# Patient Record
Sex: Female | Born: 1955 | Race: White | Hispanic: No | Marital: Married | Smoking: Never smoker
Health system: Southern US, Community
[De-identification: ages and names within clinical notes are randomized; demographics above are authoritative.]

## PROBLEM LIST (undated history)

## (undated) DIAGNOSIS — I639 Cerebral infarction, unspecified: Secondary | ICD-10-CM

## (undated) DIAGNOSIS — F0390 Unspecified dementia without behavioral disturbance: Secondary | ICD-10-CM

## (undated) DIAGNOSIS — E46 Unspecified protein-calorie malnutrition: Secondary | ICD-10-CM

## (undated) DIAGNOSIS — E7800 Pure hypercholesterolemia, unspecified: Secondary | ICD-10-CM

## (undated) DIAGNOSIS — I10 Essential (primary) hypertension: Secondary | ICD-10-CM

---

## 2006-10-20 ENCOUNTER — Inpatient Hospital Stay (HOSPITAL_COMMUNITY): Admission: EM | Admit: 2006-10-20 | Discharge: 2006-10-21 | Payer: Self-pay | Admitting: Emergency Medicine

## 2006-10-23 ENCOUNTER — Inpatient Hospital Stay (HOSPITAL_COMMUNITY): Admission: EM | Admit: 2006-10-23 | Discharge: 2006-10-28 | Payer: Self-pay | Admitting: Emergency Medicine

## 2006-10-28 ENCOUNTER — Inpatient Hospital Stay (HOSPITAL_COMMUNITY)
Admission: RE | Admit: 2006-10-28 | Discharge: 2006-11-05 | Payer: Self-pay | Admitting: Physical Medicine & Rehabilitation

## 2006-10-28 ENCOUNTER — Ambulatory Visit: Payer: Self-pay | Admitting: Physical Medicine & Rehabilitation

## 2006-11-07 ENCOUNTER — Encounter (HOSPITAL_COMMUNITY)
Admission: RE | Admit: 2006-11-07 | Discharge: 2006-12-07 | Payer: Self-pay | Admitting: Physical Medicine & Rehabilitation

## 2006-11-11 ENCOUNTER — Encounter: Payer: Self-pay | Admitting: Interventional Radiology

## 2006-12-09 ENCOUNTER — Encounter (HOSPITAL_COMMUNITY)
Admission: RE | Admit: 2006-12-09 | Discharge: 2006-12-30 | Payer: Self-pay | Admitting: Physical Medicine & Rehabilitation

## 2006-12-11 ENCOUNTER — Encounter
Admission: RE | Admit: 2006-12-11 | Discharge: 2007-03-11 | Payer: Self-pay | Admitting: Physical Medicine & Rehabilitation

## 2006-12-11 ENCOUNTER — Ambulatory Visit: Payer: Self-pay | Admitting: Physical Medicine & Rehabilitation

## 2007-01-01 ENCOUNTER — Encounter (HOSPITAL_COMMUNITY)
Admission: RE | Admit: 2007-01-01 | Discharge: 2007-01-31 | Payer: Self-pay | Admitting: Physical Medicine & Rehabilitation

## 2007-02-04 ENCOUNTER — Encounter
Admission: RE | Admit: 2007-02-04 | Discharge: 2007-05-05 | Payer: Self-pay | Admitting: Physical Medicine & Rehabilitation

## 2007-02-04 ENCOUNTER — Ambulatory Visit: Payer: Self-pay | Admitting: Physical Medicine & Rehabilitation

## 2011-01-20 ENCOUNTER — Encounter: Payer: Self-pay | Admitting: Interventional Radiology

## 2013-10-01 ENCOUNTER — Other Ambulatory Visit (HOSPITAL_COMMUNITY): Payer: Self-pay | Admitting: Family Medicine

## 2013-10-01 DIAGNOSIS — Z139 Encounter for screening, unspecified: Secondary | ICD-10-CM

## 2013-10-02 ENCOUNTER — Other Ambulatory Visit (HOSPITAL_COMMUNITY): Payer: Self-pay

## 2013-10-06 ENCOUNTER — Other Ambulatory Visit (HOSPITAL_COMMUNITY): Payer: Self-pay

## 2016-01-03 ENCOUNTER — Emergency Department (HOSPITAL_COMMUNITY)
Admission: EM | Admit: 2016-01-03 | Discharge: 2016-01-03 | Disposition: A | Discharge status: Home / Self Care | Payer: 59 | Attending: Emergency Medicine | Admitting: Emergency Medicine

## 2016-01-03 ENCOUNTER — Emergency Department (HOSPITAL_COMMUNITY): Payer: 59

## 2016-01-03 ENCOUNTER — Encounter (HOSPITAL_COMMUNITY): Payer: Self-pay | Admitting: Emergency Medicine

## 2016-01-03 DIAGNOSIS — S80212A Abrasion, left knee, initial encounter: Secondary | ICD-10-CM | POA: Insufficient documentation

## 2016-01-03 DIAGNOSIS — Z8673 Personal history of transient ischemic attack (TIA), and cerebral infarction without residual deficits: Secondary | ICD-10-CM | POA: Diagnosis not present

## 2016-01-03 DIAGNOSIS — W1839XA Other fall on same level, initial encounter: Secondary | ICD-10-CM | POA: Diagnosis not present

## 2016-01-03 DIAGNOSIS — S80211A Abrasion, right knee, initial encounter: Secondary | ICD-10-CM | POA: Insufficient documentation

## 2016-01-03 DIAGNOSIS — R4182 Altered mental status, unspecified: Secondary | ICD-10-CM | POA: Diagnosis present

## 2016-01-03 DIAGNOSIS — F0390 Unspecified dementia without behavioral disturbance: Secondary | ICD-10-CM | POA: Diagnosis not present

## 2016-01-03 DIAGNOSIS — Y998 Other external cause status: Secondary | ICD-10-CM | POA: Insufficient documentation

## 2016-01-03 DIAGNOSIS — Y9289 Other specified places as the place of occurrence of the external cause: Secondary | ICD-10-CM | POA: Diagnosis not present

## 2016-01-03 DIAGNOSIS — I10 Essential (primary) hypertension: Secondary | ICD-10-CM | POA: Diagnosis not present

## 2016-01-03 DIAGNOSIS — Y9389 Activity, other specified: Secondary | ICD-10-CM | POA: Insufficient documentation

## 2016-01-03 DIAGNOSIS — Z8639 Personal history of other endocrine, nutritional and metabolic disease: Secondary | ICD-10-CM | POA: Insufficient documentation

## 2016-01-03 DIAGNOSIS — S80219A Abrasion, unspecified knee, initial encounter: Secondary | ICD-10-CM

## 2016-01-03 DIAGNOSIS — W19XXXA Unspecified fall, initial encounter: Secondary | ICD-10-CM

## 2016-01-03 HISTORY — DX: Cerebral infarction, unspecified: I63.9

## 2016-01-03 HISTORY — DX: Pure hypercholesterolemia, unspecified: E78.00

## 2016-01-03 HISTORY — DX: Essential (primary) hypertension: I10

## 2016-01-03 LAB — CBC
HCT: 42.3 % (ref 36.0–46.0)
HEMOGLOBIN: 14.6 g/dL (ref 12.0–15.0)
MCH: 31.3 pg (ref 26.0–34.0)
MCHC: 34.5 g/dL (ref 30.0–36.0)
MCV: 90.8 fL (ref 78.0–100.0)
PLATELETS: 163 10*3/uL (ref 150–400)
RBC: 4.66 MIL/uL (ref 3.87–5.11)
RDW: 11.9 % (ref 11.5–15.5)
WBC: 8.5 10*3/uL (ref 4.0–10.5)

## 2016-01-03 LAB — BASIC METABOLIC PANEL
ANION GAP: 11 (ref 5–15)
BUN: 17 mg/dL (ref 6–20)
CHLORIDE: 106 mmol/L (ref 101–111)
CO2: 22 mmol/L (ref 22–32)
Calcium: 9.1 mg/dL (ref 8.9–10.3)
Creatinine, Ser: 0.99 mg/dL (ref 0.44–1.00)
Glucose, Bld: 105 mg/dL — ABNORMAL HIGH (ref 65–99)
POTASSIUM: 4.1 mmol/L (ref 3.5–5.1)
SODIUM: 139 mmol/L (ref 135–145)

## 2016-01-03 LAB — URINALYSIS, ROUTINE W REFLEX MICROSCOPIC
Bilirubin Urine: NEGATIVE
Glucose, UA: NEGATIVE mg/dL
HGB URINE DIPSTICK: NEGATIVE
Ketones, ur: NEGATIVE mg/dL
LEUKOCYTES UA: NEGATIVE
NITRITE: NEGATIVE
PROTEIN: 30 mg/dL — AB
SPECIFIC GRAVITY, URINE: 1.025 (ref 1.005–1.030)
pH: 5.5 (ref 5.0–8.0)

## 2016-01-03 LAB — URINE MICROSCOPIC-ADD ON: RBC / HPF: NONE SEEN RBC/hpf (ref 0–5)

## 2016-01-03 NOTE — ED Notes (Signed)
Pt brought in by EMS for confusion.  Husband states that pt has confusion normally due to a stroke and it is sometimes worse than others.  PT was found walking down the road where bystanders called ems due to pt appearing lost.

## 2016-01-03 NOTE — ED Provider Notes (Signed)
CSN: [id number]     Arrival date & time 01/03/16  1443 History   First MD Initiated Contact with Patient 01/03/16 1455     Chief Complaint  Patient presents with  . Altered Mental Status   Level V caveat due to altered mental status history obtained from husband  (Consider location/radiation/quality/duration/timing/severity/associated sxs/prior Treatment) HPI Female with a history of multiple strokes and dementia who is brought in today via EMS after she wandered away from home and fell. Her husband is the historian. He states that she was going out onto the porch normally does not wander. Today she wondered several blocks from home. She fell on the ground and that is what alerted stander by to help her. They were able to obtain her name and brought her in to the hospital via EMS secondary to confusion. Husband states that she is at baseline. He has not noted any changes in behavior besides the fact that she wandered away this morning. She has been eating and drinking as normally. She has not had any fever, chills, cough, dyspnea or other changes in behavior. She walks with a limp which he states has occurred since her first stroke. Past Medical History  Diagnosis Date  . Stroke (HCC)   . Hypertension   . High cholesterol    History reviewed. No pertinent past surgical history. History reviewed. No pertinent family history. Social History  Substance Use Topics  . Smoking status: Never Smoker   . Smokeless tobacco: None  . Alcohol Use: No   OB History    No data available     Review of Systems  All other systems reviewed and are negative.     Allergies  Review of patient's allergies indicates no known allergies.  Home Medications   Prior to Admission medications   Not on File   BP 195/93 mmHg  Pulse 86  Temp(Src) 97.9 F (36.6 C) (Oral)  Resp 18  Ht 5\' 6"  (1.676 m)  Wt 81.647 kg  BMI 29.07 kg/m2  SpO2 96% Physical Exam  Constitutional: She appears well-developed  and well-nourished.  HENT:  Head: Normocephalic and atraumatic.  Right Ear: External ear normal.  Left Ear: External ear normal.  Nose: Nose normal.  Mouth/Throat: Oropharynx is clear and moist.  Eyes: Conjunctivae and EOM are normal. Pupils are equal, round, and reactive to light.  Neck: Normal range of motion. Neck supple. No JVD present. No tracheal deviation present. No thyromegaly present.  Cardiovascular: Normal rate, regular rhythm, normal heart sounds and intact distal pulses.   Pulmonary/Chest: Effort normal and breath sounds normal. She has no wheezes.  Abdominal: Soft. Bowel sounds are normal. She exhibits no mass. There is no tenderness. There is no guarding.  Musculoskeletal: Normal range of motion.  Bilateral knee abrasion  Lymphadenopathy:    She has no cervical adenopathy.  Neurological: She is alert. She has normal reflexes. No cranial nerve deficit or sensory deficit. Gait normal. GCS eye subscore is 4. GCS verbal subscore is 5. GCS motor subscore is 6.  Reflex Scores:      Bicep reflexes are 2+ on the right side and 2+ on the left side.      Patellar reflexes are 2+ on the right side and 2+ on the left side. Patient is oriented to person and place but not to date including year  Strength is normal and equal throughout. Cranial nerves grossly intact. Patient fluent. No gross ataxia and patient able to ambulate without difficulty. She has a  mild limp  Skin: Skin is warm and dry.  Psychiatric: She has a normal mood and affect. Her behavior is normal.  Nursing note and vitals reviewed.   ED Course  Procedures (including critical care time) Labs Review Labs Reviewed  BASIC METABOLIC PANEL - Abnormal; Notable for the following:    Glucose, Bld 105 (*)    All other components within normal limits  URINALYSIS, ROUTINE W REFLEX MICROSCOPIC (NOT AT [facility]) - Abnormal; Notable for the following:    Protein, ur 30 (*)    All other components within normal limits  URINE  MICROSCOPIC-ADD ON - Abnormal; Notable for the following:    Squamous Epithelial / LPF 0-5 (*)    Bacteria, UA RARE (*)    Casts HYALINE CASTS (*)    All other components within normal limits  CBC    Imaging Review Ct Head Wo Contrast  01/03/2016  CLINICAL DATA:  New onset confusion. History of previous strokes. History of hypertension and hypercholesterolemia. EXAM: CT HEAD WITHOUT CONTRAST TECHNIQUE: Contiguous axial images were obtained from the base of the skull through the vertex without intravenous contrast. COMPARISON:  CT head 10/23/2006.  MR head 10/23/2006. FINDINGS: Global atrophy with hydrocephalus ex vacuo, premature for the patient's age of 60. No visible acute stroke, acute hemorrhage, mass lesion, or extra-axial fluid. Extensive white matter disease throughout the periventricular and subcortical white matter. Remote BILATERAL lacunar infarcts affecting deep white matter and deep nuclei. Calvarium intact. Vascular calcification. Prominence of the LEFT ICA terminus noted on prior CT. No sinus or mastoid disease. IMPRESSION: Chronic changes as described. No acute intracranial findings. No acute cortical infarct or intracranial hemorrhage is observed. Electronically Signed   By: Elsie StainJohn T Curnes M.D.   On: 01/03/2016 16:00   I have personally reviewed and evaluated these images and lab results as part of my medical decision-making.   MDM   Final diagnoses:  Dementia, without behavioral disturbance  Fall, initial encounter  Knee abrasion, unspecified laterality, initial encounter   60 year old female with history of strokes and dementia presents today after wandering away from home and falling. She has some abrasions to her knees but appears otherwise uninjured. She is ambulating here without difficulty. Labs were obtained and showed no evidence of acute abnormality. She is discharged home with her husband at baseline condition.    Margarita Grizzleanielle Kierah Goatley, MD 01/03/16 [phone]

## 2016-01-03 NOTE — Discharge Instructions (Signed)

## 2016-12-15 ENCOUNTER — Encounter (HOSPITAL_COMMUNITY): Payer: Self-pay | Admitting: Emergency Medicine

## 2016-12-15 ENCOUNTER — Emergency Department (HOSPITAL_COMMUNITY): Payer: 59

## 2016-12-15 ENCOUNTER — Inpatient Hospital Stay (HOSPITAL_COMMUNITY)
Admission: EM | Admit: 2016-12-15 | Discharge: 2016-12-18 | DRG: 690 | Disposition: A | Discharge status: Skilled Nursing Facility | Payer: 59 | Attending: Internal Medicine | Admitting: Internal Medicine

## 2016-12-15 DIAGNOSIS — I10 Essential (primary) hypertension: Secondary | ICD-10-CM | POA: Diagnosis not present

## 2016-12-15 DIAGNOSIS — Z8673 Personal history of transient ischemic attack (TIA), and cerebral infarction without residual deficits: Secondary | ICD-10-CM

## 2016-12-15 DIAGNOSIS — I639 Cerebral infarction, unspecified: Secondary | ICD-10-CM

## 2016-12-15 DIAGNOSIS — E7800 Pure hypercholesterolemia, unspecified: Secondary | ICD-10-CM

## 2016-12-15 DIAGNOSIS — R2689 Other abnormalities of gait and mobility: Secondary | ICD-10-CM

## 2016-12-15 DIAGNOSIS — I69319 Unspecified symptoms and signs involving cognitive functions following cerebral infarction: Secondary | ICD-10-CM

## 2016-12-15 DIAGNOSIS — N3000 Acute cystitis without hematuria: Secondary | ICD-10-CM | POA: Diagnosis not present

## 2016-12-15 DIAGNOSIS — E872 Acidosis, unspecified: Secondary | ICD-10-CM | POA: Diagnosis present

## 2016-12-15 DIAGNOSIS — W19XXXA Unspecified fall, initial encounter: Secondary | ICD-10-CM

## 2016-12-15 DIAGNOSIS — Z79899 Other long term (current) drug therapy: Secondary | ICD-10-CM

## 2016-12-15 DIAGNOSIS — R296 Repeated falls: Secondary | ICD-10-CM | POA: Diagnosis present

## 2016-12-15 DIAGNOSIS — M6281 Muscle weakness (generalized): Secondary | ICD-10-CM

## 2016-12-15 DIAGNOSIS — N3001 Acute cystitis with hematuria: Principal | ICD-10-CM

## 2016-12-15 DIAGNOSIS — N390 Urinary tract infection, site not specified: Secondary | ICD-10-CM | POA: Diagnosis present

## 2016-12-15 DIAGNOSIS — R531 Weakness: Secondary | ICD-10-CM

## 2016-12-15 LAB — COMPREHENSIVE METABOLIC PANEL
ALT: 12 U/L — ABNORMAL LOW (ref 14–54)
ANION GAP: 10 (ref 5–15)
AST: 24 U/L (ref 15–41)
Albumin: 3.8 g/dL (ref 3.5–5.0)
Alkaline Phosphatase: 64 U/L (ref 38–126)
BUN: 14 mg/dL (ref 6–20)
CO2: 22 mmol/L (ref 22–32)
Calcium: 8.7 mg/dL — ABNORMAL LOW (ref 8.9–10.3)
Chloride: 106 mmol/L (ref 101–111)
Creatinine, Ser: 0.97 mg/dL (ref 0.44–1.00)
GFR calc non Af Amer: >60 mL/min (ref >60)
Glucose, Bld: 126 mg/dL — ABNORMAL HIGH (ref 65–99)
Potassium: 3.5 mmol/L (ref 3.5–5.1)
SODIUM: 138 mmol/L (ref 135–145)
Total Bilirubin: 0.5 mg/dL (ref 0.3–1.2)
Total Protein: 7 g/dL (ref 6.5–8.1)

## 2016-12-15 LAB — I-STAT CG4 LACTIC ACID, ED: Lactic Acid, Venous: 1.95 mmol/L (ref 0.5–1.9)

## 2016-12-15 LAB — LACTIC ACID, PLASMA: LACTIC ACID, VENOUS: 0.9 mmol/L (ref 0.5–1.9)

## 2016-12-15 LAB — URINALYSIS, ROUTINE W REFLEX MICROSCOPIC
Bilirubin Urine: NEGATIVE
GLUCOSE, UA: NEGATIVE mg/dL
KETONES UR: NEGATIVE mg/dL
NITRITE: NEGATIVE
PH: 5 (ref 5.0–8.0)
PROTEIN: 100 mg/dL — AB
Specific Gravity, Urine: 1.016 (ref 1.005–1.030)

## 2016-12-15 LAB — CBC WITH DIFFERENTIAL/PLATELET
Basophils Absolute: 0 10*3/uL (ref 0.0–0.1)
Basophils Relative: 0 %
EOS ABS: 0 10*3/uL (ref 0.0–0.7)
EOS PCT: 0 %
HCT: 35.7 % — ABNORMAL LOW (ref 36.0–46.0)
Hemoglobin: 11.8 g/dL — ABNORMAL LOW (ref 12.0–15.0)
LYMPHS ABS: 0.3 10*3/uL — AB (ref 0.7–4.0)
Lymphocytes Relative: 4 %
MCH: 30.6 pg (ref 26.0–34.0)
MCHC: 33.1 g/dL (ref 30.0–36.0)
MCV: 92.7 fL (ref 78.0–100.0)
MONO ABS: 0.6 10*3/uL (ref 0.1–1.0)
MONOS PCT: 7 %
Neutro Abs: 7.7 10*3/uL (ref 1.7–7.7)
Neutrophils Relative %: 89 %
PLATELETS: 180 10*3/uL (ref 150–400)
RBC: 3.85 MIL/uL — ABNORMAL LOW (ref 3.87–5.11)
RDW: 14.6 % (ref 11.5–15.5)
WBC: 8.6 10*3/uL (ref 4.0–10.5)

## 2016-12-15 MED ORDER — ACETAMINOPHEN 650 MG RE SUPP: 650.0000 mg | Freq: Four times a day (QID) | RECTAL | Status: DC | PRN

## 2016-12-15 MED ORDER — ATORVASTATIN CALCIUM 10 MG PO TABS
10.0000 mg | ORAL_TABLET | Freq: Every day | ORAL | Status: DC
  Administered 2016-12-16 – 2016-12-18 (×3): 10 mg via ORAL
  Filled 2016-12-15 (×3): qty 1

## 2016-12-15 MED ORDER — ONDANSETRON HCL 4 MG PO TABS: 4.0000 mg | ORAL_TABLET | Freq: Four times a day (QID) | ORAL | Status: DC | PRN

## 2016-12-15 MED ORDER — CLONIDINE HCL 0.2 MG PO TABS
0.2000 mg | ORAL_TABLET | Freq: Two times a day (BID) | ORAL | Status: DC
  Administered 2016-12-15 – 2016-12-18 (×5): 0.2 mg via ORAL
  Filled 2016-12-15 (×6): qty 1

## 2016-12-15 MED ORDER — POLYETHYLENE GLYCOL 3350 17 G PO PACK: 17.0000 g | PACK | Freq: Every day | ORAL | Status: DC | PRN

## 2016-12-15 MED ORDER — ENSURE ENLIVE PO LIQD
237.0000 mL | Freq: Two times a day (BID) | ORAL | Status: DC
  Administered 2016-12-16 – 2016-12-18 (×5): 237 mL via ORAL

## 2016-12-15 MED ORDER — DEXTROSE 5 % IV SOLN
1.0000 g | INTRAVENOUS | Status: DC
  Administered 2016-12-16 – 2016-12-17 (×2): 1 g via INTRAVENOUS
  Filled 2016-12-15 (×3): qty 10

## 2016-12-15 MED ORDER — DEXTROSE 5 % IV SOLN
1.0000 g | Freq: Once | INTRAVENOUS | Status: AC
  Administered 2016-12-15: 1 g via INTRAVENOUS
  Filled 2016-12-15: qty 10

## 2016-12-15 MED ORDER — ACETAMINOPHEN 325 MG PO TABS: 650.0000 mg | ORAL_TABLET | Freq: Four times a day (QID) | ORAL | Status: DC | PRN

## 2016-12-15 MED ORDER — SODIUM CHLORIDE 0.9 % IV BOLUS (SEPSIS)
1000.0000 mL | Freq: Once | INTRAVENOUS | Status: AC
  Administered 2016-12-15: 1000 mL via INTRAVENOUS

## 2016-12-15 MED ORDER — POTASSIUM CHLORIDE IN NACL 40-0.9 MEQ/L-% IV SOLN
INTRAVENOUS | Status: DC
  Administered 2016-12-15 – 2016-12-18 (×5): 75 mL/h via INTRAVENOUS

## 2016-12-15 MED ORDER — ASPIRIN-DIPYRIDAMOLE ER 25-200 MG PO CP12
1.0000 | ORAL_CAPSULE | Freq: Two times a day (BID) | ORAL | Status: DC
  Administered 2016-12-15 – 2016-12-18 (×5): 1 via ORAL
  Filled 2016-12-15 (×8): qty 1

## 2016-12-15 MED ORDER — ONDANSETRON HCL 4 MG/2ML IJ SOLN
4.0000 mg | Freq: Four times a day (QID) | INTRAMUSCULAR | Status: DC | PRN
  Administered 2016-12-17 – 2016-12-18 (×2): 4 mg via INTRAVENOUS
  Filled 2016-12-15 (×2): qty 2

## 2016-12-15 MED ORDER — ENOXAPARIN SODIUM 40 MG/0.4ML ~~LOC~~ SOLN
40.0000 mg | SUBCUTANEOUS | Status: DC
  Administered 2016-12-15 – 2016-12-17 (×2): 40 mg via SUBCUTANEOUS
  Filled 2016-12-15 (×3): qty 0.4

## 2016-12-15 NOTE — Progress Notes (Signed)
Text-paged MD to request safety sitter. Earnstine RegalAshley Erinn Huskins, RN

## 2016-12-15 NOTE — Progress Notes (Signed)
Fall prevention, safety plan discussed with patient and husband on admission. Patient's husband verbalized understanding of safety plan. pt requires frequent reinforcement. bedalarm on for safety, call light and personal items left within reach. Safety mats placed by bedside. Yellow nonslip socks and fall risk bracelet in place. Frequently reminded not to attempt getting up on her own. Earnstine RegalAshley Miranda Frese, RN

## 2016-12-15 NOTE — H&P (Signed)
History and Physical  Maria Reddenmy A Mapps ZOX:[id number] DOB: 1956-08-26 DOA: 12/15/2016  Referring physician: Dr Rush Landmarkegeler, ED physician PCP: Colette RibasGOLDING, JOHN CABOT, MD  Outpatient Specialists:     Chief Complaint: Weakness, falls  HPI: Maria Ortega is a 60 y.o. female with a history of multiple strokes on aggrenox, hypertension, high cholesterol. She presents to the emergency department after having a couple of falls earlier today. 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 First fall occurred when ambulating back from using the bathroom this morning - she was walking back to the couch and barely made it to the couch before she collapsed. Second fall occurred when she was getting dressed - her husband was helping her and she fell off from the chair that she was sitting on. Patient did not lose consciousness or strike her head. She denies fevers, chills. She has been using the bathroom more frequently, although appearing small amounts. She has been nauseated intermittently since last night. No palliating or provoking factors.   Emergency Department Course: Urinalysis suggestive of UTI. She does have a lactic acidosis of 1.95. She received 1 L bolus of IV fluids. CT of the head was negative. Patient received the gram of Rocephin in the emergency department.  Review of Systems:   Pt complains of nausea.  Pt denies any fevers, chills, vomiting, diarrhea, constipation, abdominal pain, shortness of breath, dyspnea on exertion, orthopnea, cough, wheezing, palpitations, headache, vision changes, lightheadedness, dizziness, melena, rectal bleeding.  Review of systems are otherwise negative  Past Medical History:  Diagnosis Date  . High cholesterol   . Hypertension   . Stroke [facility](HCC)    History reviewed. No pertinent surgical history. Social History:  reports that she has never smoked. She does not have any smokeless tobacco history on file. She  reports that she does not drink alcohol or use drugs. Patient lives at Home  No Known Allergies  History reviewed. No pertinent family history.  Patient unaware of family history  Prior to Admission medications   Medication Sig Start Date End Date Taking? Authorizing Provider  atorvastatin (LIPITOR) 10 MG tablet Take 1 tablet by mouth daily. 12/11/16  Yes Historical Provider, MD  Chlorpheniramine-Acetaminophen (CORICIDIN HBP COLD/FLU PO) Take 2 tablets by mouth daily as needed.   Yes Historical Provider, MD  cloNIDine (CATAPRES) 0.2 MG tablet Take 1 tablet by mouth 2 (two) times daily. 12/06/16  Yes Historical Provider, MD  dipyridamole-aspirin (AGGRENOX) 200-25 MG 12hr capsule TK 1 C PO BID 12/14/15  Yes Historical Provider, MD    Physical Exam: BP 170/83   Pulse 81   Temp 98.3 F (36.8 C) (Oral)   Resp (!) 32   Ht 5\' 5"  (1.651 m)   Wt 74.8 kg (165 lb)   SpO2 100%   BMI 27.46 kg/m   General: Elderly Caucasian female. Awake and alert and oriented x3. No acute cardiopulmonary distress.  HEENT: Normocephalic atraumatic.  Right and left ears normal in appearance.  Pupils equal, round, reactive to light. Extraocular muscles are intact. Sclerae anicteric and noninjected.  Moist mucosal membranes. No mucosal lesions.  Neck: Neck supple without lymphadenopathy. No carotid bruits. No masses palpated.  Cardiovascular: Regular rate with normal S1-S2 sounds. No murmurs, rubs, gallops auscultated. No JVD.  Respiratory: Good respiratory effort with no wheezes, rales, rhonchi. Lungs clear to auscultation bilaterally.  No accessory muscle use. Abdomen: Soft, nondistended. Suprapubic tenderness to palpation. Active bowel sounds. No masses or hepatosplenomegaly  Skin: No rashes, lesions, or ulcerations.  Dry, warm to touch. 2+ dorsalis pedis and radial pulses. Musculoskeletal: No calf or leg pain. All major joints not erythematous nontender.  No upper or lower joint deformation.  Good ROM.  No  contractures  Psychiatric: Intact judgment and insight. Pleasant and cooperative. Neurologic: No focal neurological deficits. Strength is 5/5 and symmetric in upper and lower extremities.  Cranial nerves II through XII are grossly intact.           Labs on Admission: I have personally reviewed following labs and imaging studies  CBC:  Recent Labs Lab 12/15/16 1330  WBC 8.6  NEUTROABS 7.7  HGB 11.8*  HCT 35.7*  MCV 92.7  PLT 180   Basic Metabolic Panel:  Recent Labs Lab 12/15/16 1330  NA 138  K 3.5  CL 106  CO2 22  GLUCOSE 126*  BUN 14  CREATININE 0.97  CALCIUM 8.7*   GFR: Estimated Creatinine Clearance: 61.6 mL/min (by C-G formula based on SCr of 0.97 mg/dL). Liver Function Tests:  Recent Labs Lab 12/15/16 1330  AST 24  ALT 12*  ALKPHOS 64  BILITOT 0.5  PROT 7.0  ALBUMIN 3.8   No results for input(s): LIPASE, AMYLASE in the last 168 hours. No results for input(s): AMMONIA in the last 168 hours. Coagulation Profile: No results for input(s): INR, PROTIME in the last 168 hours. Cardiac Enzymes: No results for input(s): CKTOTAL, CKMB, CKMBINDEX, TROPONINI in the last 168 hours. BNP (last 3 results) No results for input(s): PROBNP in the last 8760 hours. HbA1C: No results for input(s): HGBA1C in the last 72 hours. CBG: No results for input(s): GLUCAP in the last 168 hours. Lipid Profile: No results for input(s): CHOL, HDL, LDLCALC, TRIG, CHOLHDL, LDLDIRECT in the last 72 hours. Thyroid Function Tests: No results for input(s): TSH, T4TOTAL, FREET4, T3FREE, THYROIDAB in the last 72 hours. Anemia Panel: No results for input(s): VITAMINB12, FOLATE, FERRITIN, TIBC, IRON, RETICCTPCT in the last 72 hours. Urine analysis:    Component Value Date/Time   COLORURINE YELLOW 12/15/2016 1323   APPEARANCEUR CLOUDY (A) 12/15/2016 1323   LABSPEC 1.016 12/15/2016 1323   PHURINE 5.0 12/15/2016 1323   GLUCOSEU NEGATIVE 12/15/2016 1323   HGBUR MODERATE (A) 12/15/2016  1323   BILIRUBINUR NEGATIVE 12/15/2016 1323   KETONESUR NEGATIVE 12/15/2016 1323   PROTEINUR 100 (A) 12/15/2016 1323   NITRITE NEGATIVE 12/15/2016 1323   LEUKOCYTESUR MODERATE (A) 12/15/2016 1323   Sepsis Labs: @LABRCNTIP (procalcitonin:4,lacticidven:4) )No results found for this or any previous visit (from the past 240 hour(s)).   Radiological Exams on Admission: Dg Chest 1 View  Result Date: 12/15/2016 CLINICAL DATA:  Larey SeatFell this morning.  Decreased activity. EXAM: CHEST 1 VIEW COMPARISON:  10/20/2006. FINDINGS: Normal sized heart. Tortuous and calcified thoracic aorta. Clear lungs. Diffuse osteopenia. No fracture or pneumothorax. IMPRESSION: No acute abnormality.  Aortic atherosclerosis. Electronically Signed   By: Beckie SaltsSteven  Reid M.D.   On: 12/15/2016 14:18   Ct Head Wo Contrast  Result Date: 12/15/2016 CLINICAL DATA:  Decreased activity following a fall this morning. EXAM: CT HEAD WITHOUT CONTRAST TECHNIQUE: Contiguous axial images were obtained from the base of the skull through the vertex without intravenous contrast. COMPARISON:  01/03/2016. FINDINGS: Brain: Diffusely enlarged ventricles and subarachnoid spaces. Patchy white matter low density in both cerebral hemispheres. Old left basal ganglia lacunar infarcts and small old right basal ganglia lacunar infarcts. There are also old bilateral thalamic lacunar infarcts. No intracranial hemorrhage, mass lesion or CT evidence  of acute infarction. Lacunar infarcts. Vascular: No hyperdense vessel or unexpected calcification. Skull: Normal. Negative for fracture or focal lesion. Sinuses/Orbits: No acute finding. Other: None. IMPRESSION: 1. No acute abnormality. 2. Mildly progressive atrophy and chronic small vessel white matter ischemic changes. 3. Old bilateral basal ganglia and thalamic lacunar infarcts. Electronically Signed   By: Beckie Salts M.D.   On: 12/15/2016 14:18    EKG: Independently reviewed. Sinus rhythm with right atrial enlargement  and right bundle branch block. No acute ST changes.  Assessment/Plan: Principal Problem:   UTI (urinary tract infection) Active Problems:   Hypertension   History of stroke   Lactic acidosis   Weakness    This patient was discussed with the ED physician, including pertinent vitals, physical exam findings, labs, and imaging.  We also discussed care given by the ED provider.  #1 UTI  Observation  Rocephin  Urine culture pending #2 lactic acidosis  Recheck serum lactate level  Continue IV fluids at 75 mLs per hour #3 weakness  No focal weaknesses  Likely secondary to UTI.  Consult PT #4 history of stroke  Continue Aggrenox #5 hypertension  Continue clonidine  DVT prophylaxis: Lovenox Consultants: PT Code Status: Full code Family Communication: Husband  Disposition Plan: Patient should be able to return home following observation   Levie Heritage, DO [facility] Pager [phone]  If 7PM-7AM, please contact night-coverage [email or web address] Password [email or web address]

## 2016-12-15 NOTE — ED Triage Notes (Signed)
Pt brought in by ems after a fall with no injury.  Husband called ems stating pt has not been walking as much as normal.  Pt has confusion at baseline.

## 2016-12-15 NOTE — ED Provider Notes (Signed)
[facility]Y DEPT Provider Note   CSN: [id number] Arrival date & time: 12/15/16  1241     History   Chief Complaint Chief Complaint  Patient presents with  . Weakness    HPI Maria Ortega is a 60 y.o. female with a past medical history significant for multiple strokes on Aggrenox, hypertension, hypercholesterolemia, and documentation of dementia who presents with fatigue, generalized weakness, and 2 falls this morning. Patient is accompanied by her husband who reports that patient was at her baseline yesterday. He reports that this morning, after she went to the restroom, she was walking back and "collapsed". He reports she denied her head or lose consciousness but just had profound weakness overall. He reports that he picked her up and set her on a chair however, she slumped out of the chair back to the ground. He denies that the patient has had any fevers or chills,  or any nausea or vomiting. She does report a mild dry cough. He says that she has not had consultation, diarrhea, or any change in urination. Patient denies any pain but does not remember the events today. Patient is on Aggrenox for her strokes. Patient denies any pain in extremities.  Husband arrived and provided supplemental history. He is very concerned about his ability to take care of her given the multiple falls she has had today while on her blood thinners.      The history is provided by the patient, the spouse and medical records. The history is limited by the condition of the patient.  Fall  This is a new problem. The current episode started 3 to 5 hours ago. The problem occurs constantly. The problem has not changed since onset.Pertinent negatives include no chest pain, no abdominal pain, no headaches and no shortness of breath. Nothing aggravates the symptoms. Nothing relieves the symptoms. She has tried nothing for the symptoms. The treatment provided no relief.     Past Medical History:  Diagnosis Date    . High cholesterol   . Hypertension   . Stroke [facility])     There are no active problems to display for this patient.   History reviewed. No pertinent surgical history.  OB History    No data available       Home Medications    Prior to Admission medications   Medication Sig Start Date End Date Taking? Authorizing Provider  dipyridamole-aspirin (AGGRENOX) 200-25 MG 12hr capsule TK 1 C PO BID 12/14/15   Historical Provider, MD    Family History History reviewed. No pertinent family history.  Social History Social History  Substance Use Topics  . Smoking status: Never Smoker  . Smokeless tobacco: Not on file  . Alcohol use No     Allergies   Patient has no known allergies.   Review of Systems Review of Systems  Constitutional: Positive for fatigue and unexpected weight change (wt loss). Negative for activity change, appetite change, chills, diaphoresis and fever.  HENT: Negative for congestion.   Eyes: Negative for visual disturbance.  Respiratory: Positive for cough (mild). Negative for chest tightness, shortness of breath, wheezing and stridor.   Cardiovascular: Negative for chest pain and palpitations.  Gastrointestinal: Negative for abdominal pain, constipation, diarrhea, nausea and vomiting.  Genitourinary: Positive for difficulty urinating (catheterized urine needed). Negative for flank pain and frequency.  Musculoskeletal: Negative for back pain, neck pain and neck stiffness.  Skin: Positive for wound (abrasion to nose).  Neurological: Negative for dizziness, facial asymmetry, weakness, light-headedness, numbness and  headaches.  Psychiatric/Behavioral: Negative for agitation.  All other systems reviewed and are negative.    Physical Exam Updated Vital Signs BP (!) 171/113 (BP Location: Right Arm)   Pulse 85   Temp 98.3 F (36.8 C) (Oral)   Resp 18   Ht 5\' 5"  (1.651 m)   Wt 165 lb (74.8 kg)   SpO2 98%   BMI 27.46 kg/m   Physical Exam   Constitutional: She appears well-developed and well-nourished. No distress.  HENT:  Head: Normocephalic and atraumatic.  Mouth/Throat: Oropharynx is clear and moist. No oropharyngeal exudate.  Eyes: Conjunctivae are normal.  Neck: Neck supple.  Cardiovascular: Normal rate and regular rhythm.   No murmur heard. Pulmonary/Chest: Effort normal and breath sounds normal. No respiratory distress. She has no wheezes. She exhibits no tenderness.  Abdominal: Soft. There is no tenderness.  Musculoskeletal: She exhibits no edema or tenderness.  Neurological: She is alert. She displays tremor. She displays normal reflexes. No cranial nerve deficit or sensory deficit. She exhibits normal muscle tone. Coordination (normal finger nose finger) normal. Abnormal gait: deferred due to falls today. GCS eye subscore is 4. GCS verbal subscore is 5. GCS motor subscore is 6.  Skin: Skin is warm and dry. Capillary refill takes less than 2 seconds. No rash noted.  Psychiatric: She has a normal mood and affect.  Nursing note and vitals reviewed.    ED Treatments / Results  Labs (all labs ordered are listed, but only abnormal results are displayed) Labs Reviewed  CBC WITH DIFFERENTIAL/PLATELET - Abnormal; Notable for the following:       Result Value   RBC 3.85 (*)    Hemoglobin 11.8 (*)    HCT 35.7 (*)    Lymphs Abs 0.3 (*)    All other components within normal limits  COMPREHENSIVE METABOLIC PANEL - Abnormal; Notable for the following:    Glucose, Bld 126 (*)    Calcium 8.7 (*)    ALT 12 (*)    All other components within normal limits  URINALYSIS, ROUTINE W REFLEX MICROSCOPIC - Abnormal; Notable for the following:    APPearance CLOUDY (*)    Hgb urine dipstick MODERATE (*)    Protein, ur 100 (*)    Leukocytes, UA MODERATE (*)    Bacteria, UA MANY (*)    All other components within normal limits  I-STAT CG4 LACTIC ACID, ED - Abnormal; Notable for the following:    Lactic Acid, Venous 1.95 (*)     All other components within normal limits  URINE CULTURE  CULTURE, BLOOD (ROUTINE X 2)  CULTURE, BLOOD (ROUTINE X 2)  LACTIC ACID, PLASMA    EKG  EKG Interpretation  Date/Time:  Saturday December 15 2016 12:47:26 EST Ventricular Rate:  85 PR Interval:    QRS Duration: 125 QT Interval:  367 QTC Calculation: 437 R Axis:   73 Text Interpretation:  Sinus rhythm Probable left atrial enlargement Right bundle branch block Right bundle branch block No STEMI Abnormal ECG Confirmed by Rush LandmarkEGELER MD, CHRISTOPHER [phone]([id number]) on 12/15/2016 1:27:45 PM       Radiology Dg Chest 1 View  Result Date: 12/15/2016 CLINICAL DATA:  Larey SeatFell this morning.  Decreased activity. EXAM: CHEST 1 VIEW COMPARISON:  10/20/2006. FINDINGS: Normal sized heart. Tortuous and calcified thoracic aorta. Clear lungs. Diffuse osteopenia. No fracture or pneumothorax. IMPRESSION: No acute abnormality.  Aortic atherosclerosis. Electronically Signed   By: Beckie SaltsSteven  Reid M.D.   On: 12/15/2016 14:18   Ct Head Wo Contrast  Result Date: 12/15/2016 CLINICAL DATA:  Decreased activity following a fall this morning. EXAM: CT HEAD WITHOUT CONTRAST TECHNIQUE: Contiguous axial images were obtained from the base of the skull through the vertex without intravenous contrast. COMPARISON:  01/03/2016. FINDINGS: Brain: Diffusely enlarged ventricles and subarachnoid spaces. Patchy white matter low density in both cerebral hemispheres. Old left basal ganglia lacunar infarcts and small old right basal ganglia lacunar infarcts. There are also old bilateral thalamic lacunar infarcts. No intracranial hemorrhage, mass lesion or CT evidence of acute infarction. Lacunar infarcts. Vascular: No hyperdense vessel or unexpected calcification. Skull: Normal. Negative for fracture or focal lesion. Sinuses/Orbits: No acute finding. Other: None. IMPRESSION: 1. No acute abnormality. 2. Mildly progressive atrophy and chronic small vessel white matter ischemic changes. 3. Old  bilateral basal ganglia and thalamic lacunar infarcts. Electronically Signed   By: Beckie SaltsSteven  Reid M.D.   On: 12/15/2016 14:18    Procedures Procedures (including critical care time)  Medications Ordered in ED Medications  cefTRIAXone (ROCEPHIN) 1 g in dextrose 5 % 50 mL IVPB (not administered)  sodium chloride 0.9 % bolus 1,000 mL (1,000 mLs Intravenous New Bag/Given 12/15/16 1455)     Initial Impression / Assessment and Plan / ED Course  I have reviewed the triage vital signs and the nursing notes.  Pertinent labs & imaging results that were available during my care of the patient were reviewed by me and considered in my medical decision making (see chart for details).  Clinical Course    Kao A Effie ShyColeman is a 60 y.o. female with a past medical history significant for multiple strokes on Aggrenox, hypertension, hypercholesterolemia, and documentation of dementia who presents with fatigue, generalized weakness, and 2 falls this morning.  History and exam are seen above.  On exam, patient has no focal neurologic deficits and a mild baseline bilateral tremor. Patient has no numbness, tingling, or weakness of extremities. Patient has no facial droop. Patient has normal extraocular movements and sensation. Patient has clear lungs and no tenderness on abdomen. No external evidence of trauma aside from a small abrasion on her nose. No neck tenderness.  Based on patient's weakness and fatigue, suspect dehydration versus occult infection leading to these symptoms. Given patient's reported 2 falls on Aggrenox, patient will have CT of the head to look for injury. CT of the head showed no acute intracranial abnormality. No evidence of hemorrhage. Chest x-ray shows no pneumonia however, a catheterized urine revealed evidence of urinary tract infection. Lactic acid slightly elevated at 1.95. Patient given fluids, cultures obtained, and Rocephin given for antibiotics for UTI.  Given patient's weakness,  fatigue, and falls in the setting of infection, patient will be admitted for further management of her infection and rehydration. Patient may need physical therapy and occupational therapy to help assess level of care needs. Husband reported that he does not feel safe with her going home given the following blood thinners with this infection.  Hospitalist team called and they will with the patient for further management.   Final Clinical Impressions(s) / ED Diagnoses   Final diagnoses:  Acute cystitis with hematuria  Fall, initial encounter     Clinical Impression: 1. Acute cystitis with hematuria   2. Fall   3. Fall, initial encounter   4. Essential hypertension   5. Cerebrovascular accident (CVA), unspecified mechanism (HCC)   6. High cholesterol     Disposition: Admit to Hospitalist     Heide Scaleshristopher J Tegeler, MD 12/15/16 1536

## 2016-12-16 ENCOUNTER — Encounter (HOSPITAL_COMMUNITY): Payer: Self-pay

## 2016-12-16 DIAGNOSIS — I69319 Unspecified symptoms and signs involving cognitive functions following cerebral infarction: Secondary | ICD-10-CM | POA: Diagnosis not present

## 2016-12-16 DIAGNOSIS — E7800 Pure hypercholesterolemia, unspecified: Secondary | ICD-10-CM | POA: Diagnosis present

## 2016-12-16 DIAGNOSIS — E872 Acidosis: Secondary | ICD-10-CM | POA: Diagnosis present

## 2016-12-16 DIAGNOSIS — N3000 Acute cystitis without hematuria: Secondary | ICD-10-CM | POA: Diagnosis not present

## 2016-12-16 DIAGNOSIS — R296 Repeated falls: Secondary | ICD-10-CM | POA: Diagnosis present

## 2016-12-16 DIAGNOSIS — Z79899 Other long term (current) drug therapy: Secondary | ICD-10-CM | POA: Diagnosis not present

## 2016-12-16 DIAGNOSIS — N3001 Acute cystitis with hematuria: Secondary | ICD-10-CM | POA: Diagnosis present

## 2016-12-16 DIAGNOSIS — I10 Essential (primary) hypertension: Secondary | ICD-10-CM | POA: Diagnosis present

## 2016-12-16 DIAGNOSIS — W19XXXA Unspecified fall, initial encounter: Secondary | ICD-10-CM | POA: Diagnosis present

## 2016-12-16 LAB — TSH: TSH: 1.384 u[IU]/mL (ref 0.350–4.500)

## 2016-12-16 LAB — CBC
HCT: 34.4 % — ABNORMAL LOW (ref 36.0–46.0)
Hemoglobin: 11.5 g/dL — ABNORMAL LOW (ref 12.0–15.0)
MCH: 30.7 pg (ref 26.0–34.0)
MCHC: 33.4 g/dL (ref 30.0–36.0)
MCV: 92 fL (ref 78.0–100.0)
Platelets: 160 10*3/uL (ref 150–400)
RBC: 3.74 MIL/uL — ABNORMAL LOW (ref 3.87–5.11)
RDW: 14.5 % (ref 11.5–15.5)
WBC: 9.3 10*3/uL (ref 4.0–10.5)

## 2016-12-16 MED ORDER — METOPROLOL TARTRATE 25 MG PO TABS
25.0000 mg | ORAL_TABLET | Freq: Two times a day (BID) | ORAL | Status: DC
  Administered 2016-12-16 – 2016-12-18 (×4): 25 mg via ORAL
  Filled 2016-12-16 (×6): qty 1

## 2016-12-16 MED ORDER — ASPIRIN-DIPYRIDAMOLE ER 25-200 MG PO CP12
ORAL_CAPSULE | ORAL | Status: AC
  Filled 2016-12-16: qty 1

## 2016-12-16 NOTE — Progress Notes (Signed)
PROGRESS NOTE    Maria Ortega  ZOX:[id number] DOB: 09-09-1956 DOA: 12/15/2016 PCP: Colette Ribas, MD     Brief Narrative:  60 y/o woman admitted from home on 12/16 due to frequent falls.   Assessment & Plan:   Principal Problem:   UTI (urinary tract infection) Active Problems:   Hypertension   History of stroke   Lactic acidosis   Weakness   UTI -Continue rocephin pending cx data.  Frequent Falls -Consult PT. -Check B12/TSH. -Likely related to stroke history. -Husband believes she will need SNF as he is unable to care for her at home given her multiple falls in the recent past.  H/o CVA -Multiple in the past. -Continue aggrenox.  HTN -BP somewhat elevated. -Home meds have been continued. -Will watch today, if still elevated in am, may consider adding/dose-changing anti-HTN meds.   DVT prophylaxis: lovenox Code Status: full code Family Communication: husband at bedside Disposition Plan: HH vs SNF  Consultants:   None  Procedures:   None  Antimicrobials:  Anti-infectives    Start     Dose/Rate Route Frequency Ordered Stop   12/16/16 1600  cefTRIAXone (ROCEPHIN) 1 g in dextrose 5 % 50 mL IVPB     1 g 100 mL/hr over 30 Minutes Intravenous Every 24 hours 12/15/16 1715     12/15/16 1515  cefTRIAXone (ROCEPHIN) 1 g in dextrose 5 % 50 mL IVPB     1 g 100 mL/hr over 30 Minutes Intravenous  Once 12/15/16 1508 12/15/16 1657       Subjective: Seems confused. Husband states this her baseline. Responds "no" to almost every question asked, even when it is not a yes/no question.  Objective: Vitals:   12/16/16 0530 12/16/16 0550 12/16/16 0642 12/16/16 0701  BP: (!) 184/85 (!) 186/92 (!) 186/92 (!) 153/119  Pulse:  72  68  Resp:      Temp:  98.6 F (37 C)    TempSrc:  Oral    SpO2:      Weight:      Height:        Intake/Output Summary (Last 24 hours) at 12/16/16 1554 Last data filed at 12/16/16 1327  Gross per 24 hour  Intake           2526.25 ml  Output             1100 ml  Net          1426.25 ml   Filed Weights   12/15/16 1244  Weight: 74.8 kg (165 lb)    Examination:  General exam: awake, confused Respiratory system: Clear to auscultation. Respiratory effort normal. Cardiovascular system:RRR. No murmurs, rubs, gallops. Gastrointestinal system: Abdomen is nondistended, soft and nontender. No organomegaly or masses felt. Normal bowel sounds heard. Central nervous system: Not oriented. Moves all 4 spontaneously Extremities: No C/C/E, +pedal pulses Skin: No rashes, lesions or ulcers.     Data Reviewed: I have personally reviewed following labs and imaging studies  CBC:  Recent Labs Lab 12/15/16 1330 12/16/16 0642  WBC 8.6 9.3  NEUTROABS 7.7  --   HGB 11.8* 11.5*  HCT 35.7* 34.4*  MCV 92.7 92.0  PLT 180 160   Basic Metabolic Panel:  Recent Labs Lab 12/15/16 1330  NA 138  K 3.5  CL 106  CO2 22  GLUCOSE 126*  BUN 14  CREATININE 0.97  CALCIUM 8.7*   GFR: Estimated Creatinine Clearance: 61.4 mL/min (by C-G formula based on SCr of 0.97 mg/dL).  Liver Function Tests:  Recent Labs Lab 12/15/16 1330  AST 24  ALT 12*  ALKPHOS 64  BILITOT 0.5  PROT 7.0  ALBUMIN 3.8   No results for input(s): LIPASE, AMYLASE in the last 168 hours. No results for input(s): AMMONIA in the last 168 hours. Coagulation Profile: No results for input(s): INR, PROTIME in the last 168 hours. Cardiac Enzymes: No results for input(s): CKTOTAL, CKMB, CKMBINDEX, TROPONINI in the last 168 hours. BNP (last 3 results) No results for input(s): PROBNP in the last 8760 hours. HbA1C: No results for input(s): HGBA1C in the last 72 hours. CBG: No results for input(s): GLUCAP in the last 168 hours. Lipid Profile: No results for input(s): CHOL, HDL, LDLCALC, TRIG, CHOLHDL, LDLDIRECT in the last 72 hours. Thyroid Function Tests: No results for input(s): TSH, T4TOTAL, FREET4, T3FREE, THYROIDAB in the last 72 hours. Anemia  Panel: No results for input(s): VITAMINB12, FOLATE, FERRITIN, TIBC, IRON, RETICCTPCT in the last 72 hours. Urine analysis:    Component Value Date/Time   COLORURINE YELLOW 12/15/2016 1323   APPEARANCEUR CLOUDY (A) 12/15/2016 1323   LABSPEC 1.016 12/15/2016 1323   PHURINE 5.0 12/15/2016 1323   GLUCOSEU NEGATIVE 12/15/2016 1323   HGBUR MODERATE (A) 12/15/2016 1323   BILIRUBINUR NEGATIVE 12/15/2016 1323   KETONESUR NEGATIVE 12/15/2016 1323   PROTEINUR 100 (A) 12/15/2016 1323   NITRITE NEGATIVE 12/15/2016 1323   LEUKOCYTESUR MODERATE (A) 12/15/2016 1323   Sepsis Labs: @LABRCNTIP (procalcitonin:4,lacticidven:4)  ) Recent Results (from the past 240 hour(s))  Blood culture (routine x 2)     Status: None (Preliminary result)   Collection Time: 12/15/16  3:30 PM  Result Value Ref Range Status   Specimen Description BLOOD BLOOD RIGHT HAND  Final   Special Requests BOTTLES DRAWN AEROBIC AND ANAEROBIC 8CC  Final   Culture NO GROWTH < 24 HOURS  Final   Report Status PENDING  Incomplete  Blood culture (routine x 2)     Status: None (Preliminary result)   Collection Time: 12/15/16  4:21 PM  Result Value Ref Range Status   Specimen Description BLOOD LEFT ANTECUBITAL  Final   Special Requests BOTTLES DRAWN AEROBIC AND ANAEROBIC 8CC  Final   Culture NO GROWTH < 24 HOURS  Final   Report Status PENDING  Incomplete         Radiology Studies: Dg Chest 1 View  Result Date: 12/15/2016 CLINICAL DATA:  Larey SeatFell this morning.  Decreased activity. EXAM: CHEST 1 VIEW COMPARISON:  10/20/2006. FINDINGS: Normal sized heart. Tortuous and calcified thoracic aorta. Clear lungs. Diffuse osteopenia. No fracture or pneumothorax. IMPRESSION: No acute abnormality.  Aortic atherosclerosis. Electronically Signed   By: Beckie SaltsSteven  Reid M.D.   On: 12/15/2016 14:18   Ct Head Wo Contrast  Result Date: 12/15/2016 CLINICAL DATA:  Decreased activity following a fall this morning. EXAM: CT HEAD WITHOUT CONTRAST TECHNIQUE:  Contiguous axial images were obtained from the base of the skull through the vertex without intravenous contrast. COMPARISON:  01/03/2016. FINDINGS: Brain: Diffusely enlarged ventricles and subarachnoid spaces. Patchy white matter low density in both cerebral hemispheres. Old left basal ganglia lacunar infarcts and small old right basal ganglia lacunar infarcts. There are also old bilateral thalamic lacunar infarcts. No intracranial hemorrhage, mass lesion or CT evidence of acute infarction. Lacunar infarcts. Vascular: No hyperdense vessel or unexpected calcification. Skull: Normal. Negative for fracture or focal lesion. Sinuses/Orbits: No acute finding. Other: None. IMPRESSION: 1. No acute abnormality. 2. Mildly progressive atrophy and chronic small vessel white matter ischemic changes.  3. Old bilateral basal ganglia and thalamic lacunar infarcts. Electronically Signed   By: Beckie SaltsSteven  Reid M.D.   On: 12/15/2016 14:18        Scheduled Meds: . atorvastatin  10 mg Oral Daily  . cefTRIAXone (ROCEPHIN)  IV  1 g Intravenous Q24H  . cloNIDine  0.2 mg Oral BID  . dipyridamole-aspirin  1 capsule Oral BID  . enoxaparin (LOVENOX) injection  40 mg Subcutaneous Q24H  . feeding supplement (ENSURE ENLIVE)  237 mL Oral BID BM  . metoprolol tartrate  25 mg Oral BID   Continuous Infusions: . 0.9 % NaCl with KCl 40 mEq / L 75 mL/hr (12/16/16 0528)     LOS: 0 days    Time spent: 25 minutes. Greater than 50% of this time was spent in direct contact with the patient coordinating care.     Chaya JanHERNANDEZ ACOSTA,ESTELA, MD [facility] Pager [phone]  If 7PM-7AM, please contact night-coverage [email or web address] Password [email or web address] 12/16/2016, 3:54 PM

## 2016-12-16 NOTE — Progress Notes (Signed)
Patient "I hate this place, if I have to stay in here I will die".  Patient insisted in getting out of bed to bsc.  Attempts x's 2, unable to bear weight, fell back into bed in the sitting position, frustrated.  No further attempts allowed.  BP remains elevated.

## 2016-12-16 NOTE — Progress Notes (Signed)
Pt. Removed IV. Attempted once in right forearm, unsuccessful. AC came to attempt an IV and pt. Refused. Will continue to monitor and encourage an IV.

## 2016-12-17 ENCOUNTER — Encounter (HOSPITAL_COMMUNITY): Payer: Self-pay

## 2016-12-17 DIAGNOSIS — N3000 Acute cystitis without hematuria: Secondary | ICD-10-CM

## 2016-12-17 LAB — BASIC METABOLIC PANEL
Anion gap: 10 (ref 5–15)
BUN: 10 mg/dL (ref 6–20)
CHLORIDE: 103 mmol/L (ref 101–111)
CO2: 23 mmol/L (ref 22–32)
Calcium: 8.7 mg/dL — ABNORMAL LOW (ref 8.9–10.3)
Creatinine, Ser: 0.74 mg/dL (ref 0.44–1.00)
GFR calc non Af Amer: >60 mL/min (ref >60)
GLUCOSE: 116 mg/dL — AB (ref 65–99)
Potassium: 3.4 mmol/L — ABNORMAL LOW (ref 3.5–5.1)
Sodium: 136 mmol/L (ref 135–145)

## 2016-12-17 LAB — CBC
HCT: 38.4 % (ref 36.0–46.0)
HEMOGLOBIN: 12.9 g/dL (ref 12.0–15.0)
MCH: 30.7 pg (ref 26.0–34.0)
MCHC: 33.6 g/dL (ref 30.0–36.0)
MCV: 91.4 fL (ref 78.0–100.0)
Platelets: 180 10*3/uL (ref 150–400)
RBC: 4.2 MIL/uL (ref 3.87–5.11)
RDW: 14.3 % (ref 11.5–15.5)
WBC: 6.8 10*3/uL (ref 4.0–10.5)

## 2016-12-17 LAB — VITAMIN B12: Vitamin B-12: 183 pg/mL (ref 180–914)

## 2016-12-17 MED ORDER — POTASSIUM CHLORIDE CRYS ER 20 MEQ PO TBCR
40.0000 meq | EXTENDED_RELEASE_TABLET | Freq: Once | ORAL | Status: AC
  Administered 2016-12-17: 40 meq via ORAL
  Filled 2016-12-17: qty 2

## 2016-12-17 MED ORDER — HYDRALAZINE HCL 20 MG/ML IJ SOLN
10.0000 mg | Freq: Four times a day (QID) | INTRAMUSCULAR | Status: DC | PRN
  Administered 2016-12-18: 10 mg via INTRAVENOUS
  Filled 2016-12-17: qty 1

## 2016-12-17 NOTE — Progress Notes (Signed)
PROGRESS NOTE    Maria Ortega  WUJ:[id number] DOB: October 27, 1956 DOA: 12/15/2016 PCP: Colette RibasGOLDING, JOHN CABOT, MD     Brief Narrative:  60 y/o woman admitted from home on 12/16 due to frequent falls.   Assessment & Plan:   Principal Problem:   UTI (urinary tract infection) Active Problems:   Hypertension   History of stroke   Lactic acidosis   Weakness   UTI -Continue rocephin day 2. Urine culture growing 100,000 gram negative rods.   Frequent Falls -Consult PT. -B12 pending /TSH normal. . -Likely related to stroke history. -Husband believes she will need SNF as he is unable to care for her at home given her multiple falls in the recent past.  H/o CVA -Multiple in the past. -Continue aggrenox.  HTN -BP somewhat elevated. -Home meds have been continued. -PRN hydralazine ordered.    DVT prophylaxis: lovenox Code Status: full code Family Communication: none at bedside Disposition Plan: HH vs SNF  Consultants:   None  Procedures:   None  Antimicrobials:  Anti-infectives    Start     Dose/Rate Route Frequency Ordered Stop   12/16/16 1600  cefTRIAXone (ROCEPHIN) 1 g in dextrose 5 % 50 mL IVPB     1 g 100 mL/hr over 30 Minutes Intravenous Every 24 hours 12/15/16 1715     12/15/16 1515  cefTRIAXone (ROCEPHIN) 1 g in dextrose 5 % 50 mL IVPB     1 g 100 mL/hr over 30 Minutes Intravenous  Once 12/15/16 1508 12/15/16 1657       Subjective: At baseline confuse. Denies pain. She wants to go home    Objective: Vitals:   12/16/16 1400 12/16/16 2222 12/17/16 0515 12/17/16 0931  BP: (!) 163/91 (!) 190/83 (!) 170/69 (!) 166/122  Pulse: 74 79 89 (!) 119  Resp: (!) 22 20 20    Temp: 98.1 F (36.7 C) 98 F (36.7 C)    TempSrc:      SpO2: 98%  97%   Weight:      Height:        Intake/Output Summary (Last 24 hours) at 12/17/16 1143 Last data filed at 12/17/16 [id number]  Gross per 24 hour  Intake          2368.75 ml  Output              700 ml  Net           1668.75 ml   Filed Weights   12/15/16 1244  Weight: 74.8 kg (165 lb)    Examination:  General exam: awake, confused Respiratory system: Clear to auscultation. Respiratory effort normal. Cardiovascular system:RRR. No murmurs, rubs, gallops. Gastrointestinal system: Abdomen is nondistended, soft and nontender. No organomegaly or masses felt. Normal bowel sounds heard. Central nervous system: Not oriented. Moves all 4 spontaneously Extremities: No C/C/E, +pedal pulses Skin: No rashes, lesions or ulcers.     Data Reviewed: I have personally reviewed following labs and imaging studies  CBC:  Recent Labs Lab 12/15/16 1330 12/16/16 0642 12/17/16 0439  WBC 8.6 9.3 6.8  NEUTROABS 7.7  --   --   HGB 11.8* 11.5* 12.9  HCT 35.7* 34.4* 38.4  MCV 92.7 92.0 91.4  PLT 180 160 180   Basic Metabolic Panel:  Recent Labs Lab 12/15/16 1330 12/17/16 0439  NA 138 136  K 3.5 3.4*  CL 106 103  CO2 22 23  GLUCOSE 126* 116*  BUN 14 10  CREATININE 0.97 0.74  CALCIUM 8.7* 8.7*  GFR: Estimated Creatinine Clearance: 74.5 mL/min (by C-G formula based on SCr of 0.74 mg/dL). Liver Function Tests:  Recent Labs Lab 12/15/16 1330  AST 24  ALT 12*  ALKPHOS 64  BILITOT 0.5  PROT 7.0  ALBUMIN 3.8   No results for input(s): LIPASE, AMYLASE in the last 168 hours. No results for input(s): AMMONIA in the last 168 hours. Coagulation Profile: No results for input(s): INR, PROTIME in the last 168 hours. Cardiac Enzymes: No results for input(s): CKTOTAL, CKMB, CKMBINDEX, TROPONINI in the last 168 hours. BNP (last 3 results) No results for input(s): PROBNP in the last 8760 hours. HbA1C: No results for input(s): HGBA1C in the last 72 hours. CBG: No results for input(s): GLUCAP in the last 168 hours. Lipid Profile: No results for input(s): CHOL, HDL, LDLCALC, TRIG, CHOLHDL, LDLDIRECT in the last 72 hours. Thyroid Function Tests:  Recent Labs  12/15/16 1630  TSH 1.384   Anemia  Panel: No results for input(s): VITAMINB12, FOLATE, FERRITIN, TIBC, IRON, RETICCTPCT in the last 72 hours. Urine analysis:    Component Value Date/Time   COLORURINE YELLOW 12/15/2016 1323   APPEARANCEUR CLOUDY (A) 12/15/2016 1323   LABSPEC 1.016 12/15/2016 1323   PHURINE 5.0 12/15/2016 1323   GLUCOSEU NEGATIVE 12/15/2016 1323   HGBUR MODERATE (A) 12/15/2016 1323   BILIRUBINUR NEGATIVE 12/15/2016 1323   KETONESUR NEGATIVE 12/15/2016 1323   PROTEINUR 100 (A) 12/15/2016 1323   NITRITE NEGATIVE 12/15/2016 1323   LEUKOCYTESUR MODERATE (A) 12/15/2016 1323   Sepsis Labs: @LABRCNTIP (procalcitonin:4,lacticidven:4)  ) Recent Results (from the past 240 hour(s))  Urine culture     Status: Abnormal (Preliminary result)   Collection Time: 12/15/16  1:23 PM  Result Value Ref Range Status   Specimen Description URINE, CLEAN CATCH  Final   Special Requests NONE  Final   Culture (A)  Final    >=100,000 COLONIES/mL KLEBSIELLA PNEUMONIAE SUSCEPTIBILITIES TO FOLLOW Performed at [facility]    Report Status PENDING  Incomplete  Blood culture (routine x 2)     Status: None (Preliminary result)   Collection Time: 12/15/16  3:30 PM  Result Value Ref Range Status   Specimen Description BLOOD BLOOD RIGHT HAND  Final   Special Requests BOTTLES DRAWN AEROBIC AND ANAEROBIC 8CC  Final   Culture NO GROWTH < 24 HOURS  Final   Report Status PENDING  Incomplete  Blood culture (routine x 2)     Status: None (Preliminary result)   Collection Time: 12/15/16  4:21 PM  Result Value Ref Range Status   Specimen Description BLOOD LEFT ANTECUBITAL  Final   Special Requests BOTTLES DRAWN AEROBIC AND ANAEROBIC 8CC  Final   Culture NO GROWTH < 24 HOURS  Final   Report Status PENDING  Incomplete         Radiology Studies: Dg Chest 1 View  Result Date: 12/15/2016 CLINICAL DATA:  Larey Seat this morning.  Decreased activity. EXAM: CHEST 1 VIEW COMPARISON:  10/20/2006. FINDINGS: Normal sized heart. Tortuous  and calcified thoracic aorta. Clear lungs. Diffuse osteopenia. No fracture or pneumothorax. IMPRESSION: No acute abnormality.  Aortic atherosclerosis. Electronically Signed   By: Beckie Salts M.D.   On: 12/15/2016 14:18   Ct Head Wo Contrast  Result Date: 12/15/2016 CLINICAL DATA:  Decreased activity following a fall this morning. EXAM: CT HEAD WITHOUT CONTRAST TECHNIQUE: Contiguous axial images were obtained from the base of the skull through the vertex without intravenous contrast. COMPARISON:  01/03/2016. FINDINGS: Brain: Diffusely enlarged ventricles and subarachnoid spaces.  Patchy white matter low density in both cerebral hemispheres. Old left basal ganglia lacunar infarcts and small old right basal ganglia lacunar infarcts. There are also old bilateral thalamic lacunar infarcts. No intracranial hemorrhage, mass lesion or CT evidence of acute infarction. Lacunar infarcts. Vascular: No hyperdense vessel or unexpected calcification. Skull: Normal. Negative for fracture or focal lesion. Sinuses/Orbits: No acute finding. Other: None. IMPRESSION: 1. No acute abnormality. 2. Mildly progressive atrophy and chronic small vessel white matter ischemic changes. 3. Old bilateral basal ganglia and thalamic lacunar infarcts. Electronically Signed   By: Beckie SaltsSteven  Reid M.D.   On: 12/15/2016 14:18        Scheduled Meds: . atorvastatin  10 mg Oral Daily  . cefTRIAXone (ROCEPHIN)  IV  1 g Intravenous Q24H  . cloNIDine  0.2 mg Oral BID  . dipyridamole-aspirin  1 capsule Oral BID  . enoxaparin (LOVENOX) injection  40 mg Subcutaneous Q24H  . feeding supplement (ENSURE ENLIVE)  237 mL Oral BID BM  . metoprolol tartrate  25 mg Oral BID   Continuous Infusions: . 0.9 % NaCl with KCl 40 mEq / L 75 mL/hr (12/16/16 2126)     LOS: 1 day    Time spent: 25 minutes. Greater than 50% of this time was spent in direct contact with the patient coordinating care.     Alba Coryegalado, Fredric Slabach A, MD [facility] Pager  [phone]  If 7PM-7AM, please contact night-coverage [email or web address] Password [email or web address] 12/17/2016, 11:43 AM

## 2016-12-17 NOTE — Clinical Social Work Note (Signed)
Clinical Social Work Assessment  Patient Details  Name: Maria Ortega MRN: [id number] Date of Birth: 03-11-1956  Date of referral:  12/17/16               Reason for consult:  Discharge Planning                Permission sought to share information with:  Family Supports Permission granted to share information::  Yes, Verbal Permission Granted  Name::     English as a second language teacher::     Relationship::  husband   Contact Information:     Housing/Transportation Living arrangements for the past 2 months:  Single Family Home Source of Information:  Spouse Patient Interpreter Needed:  None Criminal Activity/Legal Involvement Pertinent to Current Situation/Hospitalization:  No - Comment as needed Significant Relationships:  Spouse Lives with:  Spouse Do you feel safe going back to the place where you live?  Yes Need for family participation in patient care:  Yes (Comment)  Care giving concerns:  Pt's husband is concerned about pt managing at home.    Social Worker assessment / plan:  CSW met with pt and pt's husband at bedside. Pt alert, but did not participate in conversation. Pt's husband reports that pt is normally more independent than now. He could tell for the past few days that something was going on as pt had fallen at home and was saying she did not feel well. Admitted due to UTI. Pt has a history of strokes. PT evaluated pt and recommend SNF. CSW discussed placement process and husband requests [facility] if possible.   Employment status:    Forensic scientist:  Managed Care PT Recommendations:  [place] / Referral to community resources:  [place]  Patient/Family's Response to care:  Pt's husband requests rehab at d/c for pt.   Patient/Family's Understanding of and Emotional Response to Diagnosis, Current Treatment, and Prognosis:  Pt's husband is aware of admission diagnosis and treatment plan.   Emotional Assessment Appearance:   Appears stated age Attitude/Demeanor/Rapport:  Unable to Assess Affect (typically observed):  Unable to Assess Orientation:  Oriented to Self Alcohol / Substance use:  Not Applicable Psych involvement (Current and /or in the community):  No (Comment)  Discharge Needs  Concerns to be addressed:  Discharge Planning Concerns Readmission within the last 30 days:  No Current discharge risk:  Physical Impairment Barriers to Discharge:  Continued Medical Work up   Maria Ortega, [place] 12/17/2016, 2:19 PM [phone]

## 2016-12-17 NOTE — Clinical Social Work Placement (Signed)
   CLINICAL SOCIAL WORK PLACEMENT  NOTE  Date:  12/17/2016  Patient Details  Name: Maria Ortega MRN: [id number] Date of Birth: [birth year]  Clinical Social Work is seeking post-discharge placement for this patient at the Skilled  Nursing Facility level of care (*CSW will initial, date and re-position this form in  chart as items are completed):  Yes   Patient/family provided with [place] Clinical Social Work Department's list of facilities offering this level of care within the geographic area requested by the patient (or if unable, by the patient's family).  Yes   Patient/family informed of their freedom to choose among providers that offer the needed level of care, that participate in Medicare, Medicaid or managed care program needed by the patient, have an available bed and are willing to accept the patient.  Yes   Patient/family informed of [facility]'s ownership interest in [facility] and [facility], as well as of the fact that they are under no obligation to receive care at these facilities.  PASRR submitted to EDS on 12/17/16     PASRR number received on 12/17/16     Existing PASRR number confirmed on       FL2 transmitted to all facilities in geographic area requested by pt/family on 12/17/16     FL2 transmitted to all facilities within larger geographic area on       Patient informed that his/her managed care company has contracts with or will negotiate with certain facilities, including the following:            Patient/family informed of bed offers received.  Patient chooses bed at       Physician recommends and patient chooses bed at      Patient to be transferred to   on  .  Patient to be transferred to facility by       Patient family notified on   of transfer.  Name of family member notified:        PHYSICIAN       Additional Comment:    _______________________________________________ Karn CassisStultz, Jerney Baksh [place], LCSW 12/17/2016, 2:13  PM [phone]

## 2016-12-17 NOTE — Progress Notes (Signed)
MD notified of need for continuous visual monitoring of patient. Received verbal order for TeleSitter monitoring. Patient requires continuous visual monitoring for the following reasons: safety, fall risk. Charge nurse has also been informed of the need for visual monitoring of patient. Patient's husband has been informed of telemonitoring. Pt's husband has verbalized understanding of telemonitoring.

## 2016-12-17 NOTE — NC FL2 (Signed)
[place] MEDICAID FL2 LEVEL OF CARE SCREENING TOOL     IDENTIFICATION  Patient Name: Maria Ortega Birthdate: 05-29-1956 Sex: female Admission Date (Current Location): 12/15/2016  [facility] [place]   Facility and Address:  [facility],  [address], [place] [id number]      Provider Number: [id number]  Attending Physician Name and Address:  Alba CoryBelkys A Regalado, MD  Relative Name and Phone Number:       Current Level of Care: Hospital Recommended Level of Care: Skilled Nursing Facility Prior Approval Number:    Date Approved/Denied:   PASRR Number: [id number] [id number]  Discharge Plan: SNF    Current Diagnoses: Patient Active Problem List   Diagnosis Date Noted  . UTI (urinary tract infection) 12/15/2016  . Lactic acidosis 12/15/2016  . Weakness 12/15/2016  . Hypertension   . History of stroke   . High cholesterol     Orientation RESPIRATION BLADDER Height & Weight     Self  Normal Incontinent Weight: 165 lb (74.8 kg) Height:  5\' 8"  (172.7 cm)  BEHAVIORAL SYMPTOMS/MOOD NEUROLOGICAL BOWEL NUTRITION STATUS  Other (Comment) (none)  (n/a) Incontinent Diet (Heart healthy)  AMBULATORY STATUS COMMUNICATION OF NEEDS Skin   Extensive Assist Verbally Bruising, Skin abrasions                       Personal Care Assistance Level of Assistance  Bathing, Feeding, Dressing Bathing Assistance: Limited assistance Feeding assistance: Limited assistance Dressing Assistance: Limited assistance     Functional Limitations Info  Sight, Hearing, Speech Sight Info: Impaired Hearing Info: Adequate Speech Info: Adequate    SPECIAL CARE FACTORS FREQUENCY  PT (By licensed PT)     PT Frequency: 5              Contractures Contractures Info: Not present    Additional Factors Info  Code Status, Allergies Code Status Info: Full code Allergies Info: No known allergies           Current Medications (12/17/2016):  This is the current  hospital active medication list Current Facility-Administered Medications  Medication Dose Route Frequency Provider Last Rate Last Dose  . 0.9 % NaCl with KCl 40 mEq / L  infusion   Intravenous Continuous Levie HeritageJacob J Stinson, DO 75 mL/hr at 12/17/16 1144 75 mL/hr at 12/17/16 1144  . acetaminophen (TYLENOL) tablet 650 mg  650 mg Oral Q6H PRN Levie HeritageJacob J Stinson, DO       Or  . acetaminophen (TYLENOL) suppository 650 mg  650 mg Rectal Q6H PRN Rhona RaiderJacob J Stinson, DO      . atorvastatin (LIPITOR) tablet 10 mg  10 mg Oral Daily Rhona RaiderJacob J Stinson, DO   10 mg at 12/17/16 0931  . cefTRIAXone (ROCEPHIN) 1 g in dextrose 5 % 50 mL IVPB  1 g Intravenous Q24H Rhona RaiderJacob J Stinson, DO   1 g at 12/16/16 1327  . cloNIDine (CATAPRES) tablet 0.2 mg  0.2 mg Oral BID Rhona RaiderJacob J Stinson, DO   0.2 mg at 12/17/16 0931  . dipyridamole-aspirin (AGGRENOX) 200-25 MG per 12 hr capsule 1 capsule  1 capsule Oral BID Rhona RaiderJacob J Stinson, DO   1 capsule at 12/17/16 0931  . enoxaparin (LOVENOX) injection 40 mg  40 mg Subcutaneous Q24H Rhona RaiderJacob J Stinson, DO   40 mg at 12/15/16 1746  . feeding supplement (ENSURE ENLIVE) (ENSURE ENLIVE) liquid 237 mL  237 mL Oral BID BM Levie HeritageJacob J Stinson, DO   237  mL at 12/17/16 1408  . hydrALAZINE (APRESOLINE) injection 10 mg  10 mg Intravenous Q6H PRN Belkys A Regalado, MD      . metoprolol tartrate (LOPRESSOR) tablet 25 mg  25 mg Oral BID Meredeth IdeGagan S Lama, MD   25 mg at 12/17/16 0931  . ondansetron (ZOFRAN) tablet 4 mg  4 mg Oral Q6H PRN Rhona RaiderJacob J Stinson, DO       Or  . ondansetron [facility](ZOFRAN) injection 4 mg  4 mg Intravenous Q6H PRN Rhona RaiderJacob J Stinson, DO   4 mg at 12/17/16 0403  . polyethylene glycol (MIRALAX / GLYCOLAX) packet 17 g  17 g Oral Daily PRN Levie HeritageJacob J Stinson, DO         Discharge Medications: Please see discharge summary for a list of discharge medications.  Relevant Imaging Results:  Relevant Lab Results:   Additional Information SSN: [id number]  Karn CassisStultz, [place], [place]CS[place] [phone]

## 2016-12-17 NOTE — Progress Notes (Signed)
Initial Nutrition Assessment   INTERVENTION:  Ensure Enlive po BID, each supplement provides 350 kcal and 20 grams of protein    NUTRITION DIAGNOSIS:   Inadequate oral intake related to acute illness as evidenced by per patient/family report.   GOAL:   Patient will meet greater than or equal to 90% of their needs   MONITOR:   Supplement acceptance, PO intake, Labs, Weight trends  REASON FOR ASSESSMENT:   Malnutrition Screening Tool    ASSESSMENT:   Per MD: Maria Ortega is a 60 y.o. female with a history of multiple strokes on aggrenox, hypertension, high cholesterol. She presents to the emergency department after having a couple of falls earlier today. 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 First fall occurred when ambulating back from using the bathroom this morning - she was walking back to the couch and barely made it to the couch before she collapsed. Second fall occurred when she was getting dressed - her husband was helping her and she fell off from the chair that she was sitting on. Patient did not lose consciousness or strike her head. She denies fevers, chills. She has been using the bathroom more frequently, although appearing small amounts. She has been nauseated intermittently since last night.    Patient is awake and her lunch tray is here. She has eaten 100% of her potatoes and some carrots but no protein ([place]). She is feeding herself. Husband says that he prepares/obtains the meals for them at home. Her husband insists on answering every question even though RD is speaking directly to the patient. Her weight has trended down over the past year but not significantly. Her intake since admission if poor 25-50% of meals. She likes Ensure and recommend she consume 2 daily if possible since she is not eating enough currently.  [id number]  Recent Labs Lab 12/15/16 1330 12/17/16 0439  NA 138 136  K 3.5 3.4*   CL 106 103  CO2 22 23  BUN 14 10  CREATININE 0.97 0.74  CALCIUM 8.7* 8.7*  GLUCOSE 126* 116*   Labs/Meds: reviewed   Diet Order:  Diet Heart Room service appropriate? Yes; Fluid consistency: Thin  Skin:  Reviewed, no issues  Last BM:  12/17   Height:   Ht Readings from Last 1 Encounters:  12/15/16 5\' 8"  (1.727 m)    Weight:   Wt Readings from Last 1 Encounters:  12/15/16 165 lb (74.8 kg)    Ideal Body Weight:  64 kg  BMI:  Body mass index is 25.09 kg/m. overweight  Estimated Nutritional Needs:   Kcal:  1600-1750  Protein:  80-88 gr  Fluid:  1.6-1.8 liters daily  EDUCATION NEEDS:   No education needs identified at this time  Royann ShiversLynn Sanjeev Main MS,RD,CSG,LDN Office: #[phone] Pager: [phone]

## 2016-12-17 NOTE — Progress Notes (Signed)
PT has difficulty swallowing pills. She seems to not understand to swallow and holds the PO medications in her mouth. PT monitored until all medications were swallowed. BP very high, BP medications given. Will continue to monitor.

## 2016-12-17 NOTE — Evaluation (Signed)
Physical Therapy Evaluation Patient Details Name: Maria Ortega MRN: [id number] DOB: 04-17-1956 Today's Date: 12/17/2016   History of Present Illness  60 y.o. female with a history of multiple strokes on aggrenox, hypertension, high cholesterol. She presents to the emergency department after having a couple of falls earlier today. 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 First fall occurred when ambulating back from using the bathroom this morning - she was walking back to the couch and barely made it to the couch before she collapsed. Second fall occurred when she was getting dressed - her husband was helping her and she fell off from the chair that she was sitting on. Patient did not lose consciousness or strike her head. She denies fevers, chills. She has been using the bathroom more frequently, although appearing small amounts. She has been nauseated intermittently since last night. No palliating or provoking factors.  Dx: UTI  Clinical Impression  Pt received in bed, husband present.  Pt has cognitive deficits at baseline from previous CVA, therefore, husband answers the majority of PLOF questions.  Pt requires encouragement and education to fully participate in PT evaluation this AM.  Normally, she is independent with ambulation at home, as well as dressing, but requires supervision for stair negotiation in and out of the house, and supervision for bathing.  During today's PT evaluation, she required Min A for sit<>stand, and once standing she demonstrated a wide stance and was unsteady, therefore holding with both hands on the IV pole.  Although she was able to ambulate 43ft, she required Mod A due to moderate lean to the right, poor step length, and unsteadiness.  Once she returned to the room, she expressed needing to use the bathroom, therefore, she was assisted onto the [facility], but unable to void, therefore assisted into the bathroom.   However, she requires Mod A to do this due to poor ability to get lined up with commode.  At this point, she is requiring a significant amount of assistance compared to baseline, and remains a high risk for recurrent falls.  Therefore she is recommended for SNF to progress her strength, and endurance to be able to return to PLOF.      Follow Up Recommendations SNF    Equipment Recommendations  None recommended by PT    Recommendations for Other Services       Precautions / Restrictions Precautions Precautions: Fall Precaution Comments: Pt has had 2 falls just prior to admission.  Restrictions Weight Bearing Restrictions: No      Mobility  Bed Mobility Overal bed mobility: Needs Assistance Bed Mobility: Supine to Sit     Supine to sit: Supervision;HOB elevated        Transfers Overall transfer level: Needs assistance Equipment used: None Transfers: Sit to/from [employer] Sit to Stand: Min assist Stand pivot transfers: Mod assist (during transfer from chair <>BSC due to poor ability to get lined up with BSC prior to sitting down. )       General transfer comment: Attempted to have pt use BSC, however she was unable to void, therefore she was assisted into the bathroom where she was able to void.  Pt utilized the RW when ambulated from the bathroom back to the chair, and demonstrated improved balance with the RW, but requires constant assistance to use the properly and keep her hands on it instead of reaching for other things in the room.   Ambulation/Gait Ambulation/Gait  assistance: Mod assist Ambulation Distance (Feet): 80 Feet Assistive device: None Gait Pattern/deviations: Wide base of support;Step-to pattern;Trunk flexed;Decreased dorsiflexion - right   Gait velocity interpretation: <1.8 ft/sec, indicative of risk for recurrent falls General Gait Details: Pt demonstrates significant lean to the right, as well as diminished step length.  She is unable  to let go of the IV pole due to significant unsteadiness with gait.    Stairs            Wheelchair Mobility    Modified Rankin (Stroke Patients Only)       Balance Overall balance assessment: History of Falls;Needs assistance Sitting-balance support: Bilateral upper extremity supported;Feet supported Sitting balance-Leahy Scale: Fair   Postural control: Right lateral lean Standing balance support: Bilateral upper extremity supported Standing balance-Leahy Scale: Zero                               Pertinent Vitals/Pain Pain Assessment: No/denies pain    Home Living   Living Arrangements: Spouse/significant other Available Help at Discharge: Available 24 hours/day Type of Home: House Home Access: Stairs to enter   [employer] CorporationEntrance Stairs-Number of Steps: 4 Home Layout: One level Home Equipment: Walker - 2 wheels;Cane - single point;Bedside commode;Shower seat      Prior Function                 Hand Dominance   Dominant Hand: Right    Extremity/Trunk Assessment   Upper Extremity Assessment Upper Extremity Assessment: Generalized weakness;RUE deficits/detail;LUE deficits/detail RUE Deficits / Details: shoulder flexion to ~100* due to deficits from CVA.  Grip strength is normal and equal RUE Coordination: decreased fine motor LUE Deficits / Details: shoulder flexin to ~120*.  Grip strength is normal and equal  LUE Coordination: decreased fine motor    Lower Extremity Assessment Lower Extremity Assessment: RLE deficits/detail;Generalized weakness;LLE deficits/detail RLE Deficits / Details: ankle DF: 2/5 from previous CVA, hip flexion: 2/5 from previous CVA, knee flexion & extension 3/5 RLE Coordination: decreased fine motor;decreased gross motor LLE Deficits / Details: WFL       Communication   Communication: Receptive difficulties;Expressive difficulties (from previous CVA)  Cognition Arousal/Alertness: Awake/alert Behavior During Therapy:  WFL for tasks assessed/performed Overall Cognitive Status: History of cognitive impairments - at baseline                      General Comments      Exercises     Assessment/Plan    PT Assessment Patient needs continued PT services  PT Problem List Decreased strength;Decreased activity tolerance;Decreased balance;Decreased mobility;Decreased coordination;Decreased cognition;Decreased knowledge of use of DME;Decreased safety awareness;Decreased knowledge of precautions          PT Treatment Interventions DME instruction;Gait training;Functional mobility training;Therapeutic activities;Therapeutic exercise;Balance training;Stair training;Patient/family education;Cognitive remediation    PT Goals (Current goals can be found in the Care Plan section)  Acute Rehab PT Goals Patient Stated Goal: Pt wants to get stronger and be able to go home.  PT Goal Formulation: With patient/family Time For Goal Achievement: 12/24/16 Potential to Achieve Goals: Fair    Frequency Min 3X/week   Barriers to discharge        Co-evaluation               End of Session Equipment Utilized During Treatment: Gait belt Activity Tolerance: Patient limited by fatigue Patient left: in chair;with call bell/phone within reach;with chair alarm set;with family/visitor present Nurse  Communication: Mobility status Misty Stanley(Lisa, RN notified of pt's mobility status.  Mobility sheet left up in the room. )         Time: [id number] PT Time Calculation (min) (ACUTE ONLY): 38 min   Charges:   PT Evaluation $PT Eval Low Complexity: 1 Procedure PT Treatments $Gait Training: 8-22 mins $Therapeutic Activity: 8-22 mins   PT G Codes:       Beth Rodriguez Aguinaldo, PT, DPT X: [phone]

## 2016-12-18 ENCOUNTER — Inpatient Hospital Stay
Admission: RE | Admit: 2016-12-18 | Discharge: 2016-12-21 | Disposition: A | Discharge status: Nursing Home (Includes ICF) | Payer: 59 | Source: Ambulatory Visit | Attending: Internal Medicine | Admitting: Internal Medicine

## 2016-12-18 ENCOUNTER — Encounter (HOSPITAL_COMMUNITY): Payer: Self-pay | Admitting: General Practice

## 2016-12-18 LAB — BASIC METABOLIC PANEL
ANION GAP: 5 (ref 5–15)
ANION GAP: 5 (ref 5–15)
BUN: 8 mg/dL (ref 6–20)
BUN: 8 mg/dL (ref 6–20)
CO2: 23 mmol/L (ref 22–32)
CO2: 23 mmol/L (ref 22–32)
Calcium: 8.6 mg/dL — ABNORMAL LOW (ref 8.9–10.3)
Calcium: 8.6 mg/dL — ABNORMAL LOW (ref 8.9–10.3)
Chloride: 108 mmol/L (ref 101–111)
Chloride: 107 mmol/L (ref 101–111)
Creatinine, Ser: 0.67 mg/dL (ref 0.44–1.00)
Creatinine, Ser: 0.72 mg/dL (ref 0.44–1.00)
GFR calc Af Amer: >60 mL/min (ref >60)
GFR calc Af Amer: >60 mL/min (ref >60)
GFR calc non Af Amer: >60 mL/min (ref >60)
GFR calc non Af Amer: >60 mL/min (ref >60)
GLUCOSE: 96 mg/dL (ref 65–99)
GLUCOSE: 120 mg/dL — AB (ref 65–99)
POTASSIUM: 5.6 mmol/L — AB (ref 3.5–5.1)
POTASSIUM: 4.8 mmol/L (ref 3.5–5.1)
Sodium: 136 mmol/L (ref 135–145)
Sodium: 135 mmol/L (ref 135–145)

## 2016-12-18 LAB — URINE CULTURE

## 2016-12-18 MED ORDER — ENSURE ENLIVE PO LIQD: 237.0000 mL | Freq: Two times a day (BID) | ORAL | 12 refills | Status: DC

## 2016-12-18 MED ORDER — CYANOCOBALAMIN 100 MCG PO TABS: 100.0000 ug | ORAL_TABLET | Freq: Every day | ORAL | 0 refills | Status: DC

## 2016-12-18 MED ORDER — CIPROFLOXACIN HCL 500 MG PO TABS
500.0000 mg | ORAL_TABLET | Freq: Two times a day (BID) | ORAL | 0 refills | Status: DC
Start: 2016-12-18 — End: 2016-12-27

## 2016-12-18 MED ORDER — AMLODIPINE BESYLATE 5 MG PO TABS
5.0000 mg | ORAL_TABLET | Freq: Every day | ORAL | Status: DC
  Administered 2016-12-18: 5 mg via ORAL
  Filled 2016-12-18: qty 1

## 2016-12-18 MED ORDER — VITAMIN B-12 100 MCG PO TABS
100.0000 ug | ORAL_TABLET | Freq: Every day | ORAL | Status: DC
  Administered 2016-12-18: 100 ug via ORAL
  Filled 2016-12-18 (×2): qty 1

## 2016-12-18 MED ORDER — CIPROFLOXACIN HCL 250 MG PO TABS
500.0000 mg | ORAL_TABLET | Freq: Two times a day (BID) | ORAL | Status: DC
  Administered 2016-12-18: 500 mg via ORAL
  Filled 2016-12-18: qty 2

## 2016-12-18 MED ORDER — METOPROLOL TARTRATE 25 MG PO TABS: 25.0000 mg | ORAL_TABLET | Freq: Two times a day (BID) | ORAL | 0 refills | Status: DC

## 2016-12-18 MED ORDER — AMLODIPINE BESYLATE 5 MG PO TABS: 5.0000 mg | ORAL_TABLET | Freq: Every day | ORAL | 0 refills | Status: DC

## 2016-12-18 MED ORDER — SODIUM POLYSTYRENE SULFONATE 15 GM/60ML PO SUSP
15.0000 g | Freq: Once | ORAL | Status: DC
  Filled 2016-12-18: qty 60

## 2016-12-18 NOTE — Discharge Summary (Addendum)
Physician Discharge Summary  Maria Ortega ZOX:[id number] DOB: 1956-01-02 DOA: 12/15/2016  PCP: Colette Ribas, MD  Admit date: 12/15/2016 Discharge date: 12/18/2016  Admitted From: Home  Disposition:  SNF  Recommendations for Outpatient Follow-up:  1. Follow up with PCP in 1-2 weeks 2. Please obtain BMP/CBC in one week 3. Adjust BP medications as needed.     Discharge Condition: Stable.  CODE STATUS: Full code.  Diet recommendation: Heart Healthy   Brief/Interim Summary: Brief Narrative:  60 y/o woman admitted from home on 12/16 due to frequent falls.   Assessment & Plan:  UTI -Continue rocephin day 3, discharge on 3 more days of ciprofloxacin. . Urine culture growing 100,000 klebsiella.   Frequent Falls -Consult PT. -B12 183, will start supplement  /TSH normal. . -Likely related to stroke history. -Husband believes she will need SNF as he is unable to care for her at home given her multiple falls in the recent past.  H/o CVA -Multiple in the past. -Continue aggrenox.  HTN -Home meds have been continued. -will add norvasc. Adjust meds as needed.     Discharge Diagnoses:  Principal Problem:   UTI (urinary tract infection) Active Problems:   Hypertension   History of stroke   Lactic acidosis   Weakness    Discharge Instructions  Discharge Instructions    Diet - low sodium heart healthy    Complete by:  As directed    Increase activity slowly    Complete by:  As directed      Allergies as of 12/18/2016   No Known Allergies     Medication List    TAKE these medications   amLODipine 5 MG tablet Commonly known as:  NORVASC Take 1 tablet (5 mg total) by mouth daily. Start taking on:  12/19/2016   atorvastatin 10 MG tablet Commonly known as:  LIPITOR Take 1 tablet by mouth daily.   ciprofloxacin 500 MG tablet Commonly known as:  CIPRO Take 1 tablet (500 mg total) by mouth 2 (two) times daily.   cloNIDine 0.2 MG  tablet Commonly known as:  CATAPRES Take 1 tablet by mouth 2 (two) times daily.   CORICIDIN HBP COLD/FLU PO Take 2 tablets by mouth daily as needed.   cyanocobalamin 100 MCG tablet Take 1 tablet (100 mcg total) by mouth daily.   dipyridamole-aspirin 200-25 MG 12hr capsule Commonly known as:  AGGRENOX TK 1 C PO BID   feeding supplement (ENSURE ENLIVE) Liqd Take 237 mLs by mouth 2 (two) times daily between meals.   metoprolol tartrate 25 MG tablet Commonly known as:  LOPRESSOR Take 1 tablet (25 mg total) by mouth 2 (two) times daily.       No Known Allergies  Consultations:  none   Procedures/Studies: Dg Chest 1 View  Result Date: 12/15/2016 CLINICAL DATA:  Larey Seat this morning.  Decreased activity. EXAM: CHEST 1 VIEW COMPARISON:  10/20/2006. FINDINGS: Normal sized heart. Tortuous and calcified thoracic aorta. Clear lungs. Diffuse osteopenia. No fracture or pneumothorax. IMPRESSION: No acute abnormality.  Aortic atherosclerosis. Electronically Signed   By: Beckie Salts M.D.   On: 12/15/2016 14:18   Ct Head Wo Contrast  Result Date: 12/15/2016 CLINICAL DATA:  Decreased activity following a fall this morning. EXAM: CT HEAD WITHOUT CONTRAST TECHNIQUE: Contiguous axial images were obtained from the base of the skull through the vertex without intravenous contrast. COMPARISON:  01/03/2016. FINDINGS: Brain: Diffusely enlarged ventricles and subarachnoid spaces. Patchy white matter low density in both cerebral hemispheres. Old  left basal ganglia lacunar infarcts and small old right basal ganglia lacunar infarcts. There are also old bilateral thalamic lacunar infarcts. No intracranial hemorrhage, mass lesion or CT evidence of acute infarction. Lacunar infarcts. Vascular: No hyperdense vessel or unexpected calcification. Skull: Normal. Negative for fracture or focal lesion. Sinuses/Orbits: No acute finding. Other: None. IMPRESSION: 1. No acute abnormality. 2. Mildly progressive atrophy and  chronic small vessel white matter ischemic changes. 3. Old bilateral basal ganglia and thalamic lacunar infarcts. Electronically Signed   By: Beckie Salts M.D.   On: 12/15/2016 14:18     Subjective: Patient is alert, follows command. Husband at bedside think patient is almost back to baseline.  Per husband patient has had difficulty taking medications for while.   Discharge Exam: Vitals:   12/17/16 2229 12/18/16 0523  BP: (!) 119/93 (!) 185/85  Pulse: (!) 59 69  Resp:  20  Temp:  98.3 F (36.8 C)   Vitals:   12/17/16 1400 12/17/16 2100 12/17/16 2229 12/18/16 0523  BP: (!) 132/102 (!) 193/97 (!) 119/93 (!) 185/85  Pulse: 71 84 (!) 59 69  Resp: 20 20  20   Temp: 98.9 F (37.2 C) 98.5 F (36.9 C)  98.3 F (36.8 C)  TempSrc: Oral Oral  Axillary  SpO2: 100% 99%  98%  Weight:      Height:        General: Pt is alert, awake, not in acute distress Cardiovascular: RRR, S1/S2 +, no rubs, no gallops Respiratory: CTA bilaterally, no wheezing, no rhonchi Abdominal: Soft, NT, ND, bowel sounds + Extremities: no edema, no cyanosis    The results of significant diagnostics from this hospitalization (including imaging, microbiology, ancillary and laboratory) are listed below for reference.     Microbiology: Recent Results (from the past 240 hour(s))  Urine culture     Status: Abnormal   Collection Time: 12/15/16  1:23 PM  Result Value Ref Range Status   Specimen Description URINE, CLEAN CATCH  Final   Special Requests NONE  Final   Culture >=100,000 COLONIES/mL KLEBSIELLA PNEUMONIAE (A)  Final   Report Status 12/18/2016 FINAL  Final   Organism ID, Bacteria KLEBSIELLA PNEUMONIAE (A)  Final      Susceptibility   Klebsiella pneumoniae - MIC*    AMPICILLIN >=32 RESISTANT Resistant     CEFAZOLIN <=4 SENSITIVE Sensitive     CEFTRIAXONE <=1 SENSITIVE Sensitive     CIPROFLOXACIN <=0.25 SENSITIVE Sensitive     GENTAMICIN <=1 SENSITIVE Sensitive     IMIPENEM <=0.25 SENSITIVE Sensitive      NITROFURANTOIN 128 RESISTANT Resistant     TRIMETH/SULFA <=20 SENSITIVE Sensitive     AMPICILLIN/SULBACTAM >=32 RESISTANT Resistant     PIP/TAZO <=4 SENSITIVE Sensitive     Extended ESBL NEGATIVE Sensitive     * >=100,000 COLONIES/mL KLEBSIELLA PNEUMONIAE  Blood culture (routine x 2)     Status: None (Preliminary result)   Collection Time: 12/15/16  3:30 PM  Result Value Ref Range Status   Specimen Description BLOOD BLOOD RIGHT HAND  Final   Special Requests BOTTLES DRAWN AEROBIC AND ANAEROBIC 8CC  Final   Culture NO GROWTH 3 DAYS  Final   Report Status PENDING  Incomplete  Blood culture (routine x 2)     Status: None (Preliminary result)   Collection Time: 12/15/16  4:21 PM  Result Value Ref Range Status   Specimen Description BLOOD LEFT ANTECUBITAL  Final   Special Requests BOTTLES DRAWN AEROBIC AND ANAEROBIC 8CC  Final  Culture NO GROWTH 3 DAYS  Final   Report Status PENDING  Incomplete     Labs: BNP (last 3 results) No results for input(s): BNP in the last 8760 hours. Basic Metabolic Panel:  Recent Labs Lab 12/15/16 1330 12/17/16 0439 12/18/16 0441  NA 138 136 136  K 3.5 3.4* 5.6*  CL 106 103 108  CO2 22 23 23   GLUCOSE 126* 116* 96  BUN 14 10 8   CREATININE 0.97 0.74 0.67  CALCIUM 8.7* 8.7* 8.6*   Liver Function Tests:  Recent Labs Lab 12/15/16 1330  AST 24  ALT 12*  ALKPHOS 64  BILITOT 0.5  PROT 7.0  ALBUMIN 3.8   No results for input(s): LIPASE, AMYLASE in the last 168 hours. No results for input(s): AMMONIA in the last 168 hours. CBC:  Recent Labs Lab 12/15/16 1330 12/16/16 0642 12/17/16 0439  WBC 8.6 9.3 6.8  NEUTROABS 7.7  --   --   HGB 11.8* 11.5* 12.9  HCT 35.7* 34.4* 38.4  MCV 92.7 92.0 91.4  PLT 180 160 180   Cardiac Enzymes: No results for input(s): CKTOTAL, CKMB, CKMBINDEX, TROPONINI in the last 168 hours. BNP: Invalid input(s): POCBNP CBG: No results for input(s): GLUCAP in the last 168 hours. D-Dimer No results for  input(s): DDIMER in the last 72 hours. Hgb A1c No results for input(s): HGBA1C in the last 72 hours. Lipid Profile No results for input(s): CHOL, HDL, LDLCALC, TRIG, CHOLHDL, LDLDIRECT in the last 72 hours. Thyroid function studies  Recent Labs  12/15/16 1630  TSH 1.384   Anemia work up  Recent Labs  12/17/16 0439  VITAMINB12 183   Urinalysis    Component Value Date/Time   COLORURINE YELLOW 12/15/2016 1323   APPEARANCEUR CLOUDY (A) 12/15/2016 1323   LABSPEC 1.016 12/15/2016 1323   PHURINE 5.0 12/15/2016 1323   GLUCOSEU NEGATIVE 12/15/2016 1323   HGBUR MODERATE (A) 12/15/2016 1323   BILIRUBINUR NEGATIVE 12/15/2016 1323   KETONESUR NEGATIVE 12/15/2016 1323   PROTEINUR 100 (A) 12/15/2016 1323   NITRITE NEGATIVE 12/15/2016 1323   LEUKOCYTESUR MODERATE (A) 12/15/2016 1323   Sepsis Labs Invalid input(s): PROCALCITONIN,  WBC,  LACTICIDVEN Microbiology Recent Results (from the past 240 hour(s))  Urine culture     Status: Abnormal   Collection Time: 12/15/16  1:23 PM  Result Value Ref Range Status   Specimen Description URINE, CLEAN CATCH  Final   Special Requests NONE  Final   Culture >=100,000 COLONIES/mL KLEBSIELLA PNEUMONIAE (A)  Final   Report Status 12/18/2016 FINAL  Final   Organism ID, Bacteria KLEBSIELLA PNEUMONIAE (A)  Final      Susceptibility   Klebsiella pneumoniae - MIC*    AMPICILLIN >=32 RESISTANT Resistant     CEFAZOLIN <=4 SENSITIVE Sensitive     CEFTRIAXONE <=1 SENSITIVE Sensitive     CIPROFLOXACIN <=0.25 SENSITIVE Sensitive     GENTAMICIN <=1 SENSITIVE Sensitive     IMIPENEM <=0.25 SENSITIVE Sensitive     NITROFURANTOIN 128 RESISTANT Resistant     TRIMETH/SULFA <=20 SENSITIVE Sensitive     AMPICILLIN/SULBACTAM >=32 RESISTANT Resistant     PIP/TAZO <=4 SENSITIVE Sensitive     Extended ESBL NEGATIVE Sensitive     * >=100,000 COLONIES/mL KLEBSIELLA PNEUMONIAE  Blood culture (routine x 2)     Status: None (Preliminary result)   Collection Time:  12/15/16  3:30 PM  Result Value Ref Range Status   Specimen Description BLOOD BLOOD RIGHT HAND  Final   Special Requests BOTTLES DRAWN  AEROBIC AND ANAEROBIC 8CC  Final   Culture NO GROWTH 3 DAYS  Final   Report Status PENDING  Incomplete  Blood culture (routine x 2)     Status: None (Preliminary result)   Collection Time: 12/15/16  4:21 PM  Result Value Ref Range Status   Specimen Description BLOOD LEFT ANTECUBITAL  Final   Special Requests BOTTLES DRAWN AEROBIC AND ANAEROBIC 8CC  Final   Culture NO GROWTH 3 DAYS  Final   Report Status PENDING  Incomplete     Time coordinating discharge: Over 30 minutes  SIGNED:   Alba Coryegalado, Belkys A, MD  [facility] 12/18/2016, 10:07 AM Pager   If 7PM-7AM, please contact night-coverage [email or web address] Password [email or web address]

## 2016-12-18 NOTE — Clinical Social Work Placement (Signed)
   CLINICAL SOCIAL WORK PLACEMENT  NOTE  Date:  12/18/2016  Patient Details  Name: Maria Ortega MRN: [id number] Date of Birth: [birth year]  Clinical Social Work is seeking post-discharge placement for this patient at the Skilled  Nursing Facility level of care (*CSW will initial, date and re-position this form in  chart as items are completed):  Yes   Patient/family provided with [facility] Clinical Social Work Department's list of facilities offering this level of care within the geographic area requested by the patient (or if unable, by the patient's family).  Yes   Patient/family informed of their freedom to choose among providers that offer the needed level of care, that participate in Medicare, Medicaid or managed care program needed by the patient, have an available bed and are willing to accept the patient.  Yes   Patient/family informed of [facility] ownership interest [facility] and [facility], as well as of the fact that they are under no obligation to receive care at these facilities.  PASRR submitted to EDS on 12/17/16     PASRR number received on 12/17/16     Existing PASRR number confirmed on       FL2 transmitted to all facilities in geographic area requested by pt/family on 12/17/16     FL2 transmitted to all facilities within larger geographic area on       Patient informed that his/her managed care company has contracts with or will negotiate with certain facilities, including the following:        Yes   Patient/family informed of bed offers received.  [facility]     Physician recommends and patient chooses bed at      [facility] 12[facility]18/16.  Patient to be transferred to facility by staff     Patient family notified on 12/18/16 of transfer.  Name of family member notified:  Dorinda HillDonald- husband     PHYSICIAN       Additional Comment:  PNC aware pt had sitter last night.  However, no sitter this morning and pt has been okay. Facility agreeable to accept this afternoon.   _______________________________________________ Karn CassisStultz, Rozanne Heumann Shanaberger, LCSW 12/18/2016, 1:27 PM [phone]

## 2016-12-18 NOTE — Progress Notes (Signed)
Patient with [facility]. [facility]. Discharge packet sent with patient. Patient stable. [facility].

## 2016-12-18 NOTE — Care Management Note (Signed)
Case Management Note  Patient Details  Name: Maria Ortega MRN: [id number] Date of Birth: 11-15-1956  Expected Discharge Date:       12/18/2016           Expected Discharge Plan:  Skilled Nursing Facility  In-House Referral:  Clinical Social Work  Discharge planning Services  CM Consult  Status of Service:  Completed, signed off  Additional Comments: Pt discharging to SNF today. CSW has made arrangements for placement. No CM needs anticipated.   Malcolm Metrohildress, Caedan Sumler Demske, RN 12/18/2016, 1:43 PM

## 2016-12-19 ENCOUNTER — Non-Acute Institutional Stay (SKILLED_NURSING_FACILITY): Payer: 59 | Admitting: Internal Medicine

## 2016-12-19 ENCOUNTER — Encounter: Payer: Self-pay | Admitting: Internal Medicine

## 2016-12-19 DIAGNOSIS — I10 Essential (primary) hypertension: Secondary | ICD-10-CM | POA: Diagnosis not present

## 2016-12-19 DIAGNOSIS — R296 Repeated falls: Secondary | ICD-10-CM

## 2016-12-19 DIAGNOSIS — F01518 Vascular dementia, unspecified severity, with other behavioral disturbance: Secondary | ICD-10-CM

## 2016-12-19 DIAGNOSIS — I639 Cerebral infarction, unspecified: Secondary | ICD-10-CM

## 2016-12-19 DIAGNOSIS — F0151 Vascular dementia with behavioral disturbance: Secondary | ICD-10-CM

## 2016-12-19 DIAGNOSIS — N3000 Acute cystitis without hematuria: Secondary | ICD-10-CM | POA: Diagnosis not present

## 2016-12-19 DIAGNOSIS — F0390 Unspecified dementia without behavioral disturbance: Secondary | ICD-10-CM | POA: Insufficient documentation

## 2016-12-19 NOTE — Progress Notes (Signed)
Provider:  Einar CrowAnjali,Merritt Mccravy Location:   [facility] Room Number: 132/P Place of Service:  SNF (31)  PCP: Colette RibasGOLDING, JOHN CABOT, MD Patient Care Team: Assunta FoundJohn Golding, MD as PCP - General (Family Medicine)  Extended Emergency Contact Information Primary Emergency Contact: Wingrove,DONALD Address: [address]          [place],  [id number] Home Phone: [phone] Relation: None  Code Status: Full Code Goals of Care: Advanced Directive information Advanced Directives 12/19/2016  Does Patient Have a Medical Advance Directive? Yes  Type of Advance Directive (No Data)  Does patient want to make changes to medical advance directive? No - Patient declined  Would patient like information on creating a medical advance directive? No - Patient declined      Chief Complaint  Patient presents with  . New Admit To SNF    HPI: Patient is a 60 y.o. female seen today for admission to SNF for therapy. Patient has h/o Multiple Strokes, Hypertension, Hyperlipidemia And recurrent falls.And cognitive impairment. It seems patient lives with her husband and most of the history was obtained from the review of records as patient does not remember anything going on. It seems like she had fallen 2-3 times at home and the husband brought her to ED for evaluation. She was admitted with UTI . She had CT scan of her head which did not show any acute process.  She was initially treated with Rocephin and then changed to oral Cipro. According to the nurses patient was up all night and was wandering in the hallway. Per her husband she has been difficult to take of at home due to her worsening cognition and he would like her to go to facility.  Past Medical History:  Diagnosis Date  . High cholesterol   . Hypertension   . Stroke [facility](HCC)    History reviewed. No pertinent surgical history.  reports that she has never smoked. She has never used smokeless tobacco. She reports that she does not drink  alcohol or use drugs. Social History   Social History  . Marital status: Married    Spouse name: N/A  . Number of children: N/A  . Years of education: N/A   Occupational History  . Not on file.   Social History Main Topics  . Smoking status: Never Smoker  . Smokeless tobacco: Never Used  . Alcohol use No  . Drug use: No  . Sexual activity: Not on file   Other Topics Concern  . Not on file   Social History Narrative  . No narrative on file    Functional Status Survey:    History reviewed. No pertinent family history.  There are no preventive care reminders to display for this patient.  No Known Allergies  Allergies as of 12/19/2016   No Known Allergies     Medication List       Accurate as of 12/19/16  9:52 AM. Always use your most recent med list.          amLODipine 5 MG tablet Commonly known as:  NORVASC Take 1 tablet (5 mg total) by mouth daily.   atorvastatin 10 MG tablet Commonly known as:  LIPITOR Take 1 tablet by mouth daily.   ciprofloxacin 500 MG tablet Commonly known as:  CIPRO Take 1 tablet (500 mg total) by mouth 2 (two) times daily.   cloNIDine 0.2 MG tablet Commonly known as:  CATAPRES Take 1 tablet by mouth 2 (two) times daily.  CORICIDIN HBP COLD/FLU PO Take 2 tablets by mouth daily as needed.   cyanocobalamin 100 MCG tablet Take 1 tablet (100 mcg total) by mouth daily.   dipyridamole-aspirin 200-25 MG 12hr capsule Commonly known as:  AGGRENOX TK 1 C PO BID   feeding supplement (ENSURE ENLIVE) Liqd Take 237 mLs by mouth 2 (two) times daily between meals.   metoprolol tartrate 25 MG tablet Commonly known as:  LOPRESSOR Take 1 tablet (25 mg total) by mouth 2 (two) times daily.       Review of Systems  Reason unable to perform ROS: Not reliable due to dementia.    Vitals:   12/19/16 0952  BP: 137/68  Pulse: 79  Resp: 20  Temp: 97.5 F (36.4 C)  TempSrc: Oral   There is no height or weight on file to  calculate BMI. Physical Exam  Constitutional: She appears well-developed and well-nourished.  HENT:  Head: Normocephalic.  Mouth/Throat: Oropharynx is clear and moist.  Eyes: Pupils are equal, round, and reactive to light.  Neck: Neck supple.  Cardiovascular: Normal rate, regular rhythm and normal heart sounds.   No murmur heard. Pulmonary/Chest: Effort normal and breath sounds normal. No respiratory distress. She has no wheezes. She has no rales.  Abdominal: Soft. Bowel sounds are normal. She exhibits no distension. There is no tenderness. There is no rebound.  Musculoskeletal: She exhibits no edema.  Lymphadenopathy:    She has no cervical adenopathy.  Neurological: She is alert.  Not oriented to time, place or person. She had good strength in all extremities.    Labs reviewed: Basic Metabolic Panel:  Recent Labs  16/10/9611/18/17 0439 12/18/16 0441 12/18/16 1009  NA 136 136 135  K 3.4* 5.6* 4.8  CL 103 108 107  CO2 23 23 23   GLUCOSE 116* 96 120*  BUN 10 8 8   CREATININE 0.74 0.67 0.72  CALCIUM 8.7* 8.6* 8.6*   Liver Function Tests:  Recent Labs  12/15/16 1330  AST 24  ALT 12*  ALKPHOS 64  BILITOT 0.5  PROT 7.0  ALBUMIN 3.8   No results for input(s): LIPASE, AMYLASE in the last 8760 hours. No results for input(s): AMMONIA in the last 8760 hours. CBC:  Recent Labs  12/15/16 1330 12/16/16 0642 12/17/16 0439  WBC 8.6 9.3 6.8  NEUTROABS 7.7  --   --   HGB 11.8* 11.5* 12.9  HCT 35.7* 34.4* 38.4  MCV 92.7 92.0 91.4  PLT 180 160 180   Cardiac Enzymes: No results for input(s): CKTOTAL, CKMB, CKMBINDEX, TROPONINI in the last 8760 hours. BNP: Invalid input(s): POCBNP No results found for: HGBA1C Lab Results  Component Value Date   TSH 1.384 12/15/2016   Lab Results  Component Value Date   VITAMINB12 183 12/17/2016   No results found for: FOLATE No results found for: IRON, TIBC, FERRITIN  Imaging and Procedures obtained prior to SNF admission: Dg Chest  1 View  Result Date: 12/15/2016 CLINICAL DATA:  Larey SeatFell this morning.  Decreased activity. EXAM: CHEST 1 VIEW COMPARISON:  10/20/2006. FINDINGS: Normal sized heart. Tortuous and calcified thoracic aorta. Clear lungs. Diffuse osteopenia. No fracture or pneumothorax. IMPRESSION: No acute abnormality.  Aortic atherosclerosis. Electronically Signed   By: Beckie SaltsSteven  Reid M.D.   On: 12/15/2016 14:18   Ct Head Wo Contrast  Result Date: 12/15/2016 CLINICAL DATA:  Decreased activity following a fall this morning. EXAM: CT HEAD WITHOUT CONTRAST TECHNIQUE: Contiguous axial images were obtained from the base of the skull through the vertex  without intravenous contrast. COMPARISON:  01/03/2016. FINDINGS: Brain: Diffusely enlarged ventricles and subarachnoid spaces. Patchy white matter low density in both cerebral hemispheres. Old left basal ganglia lacunar infarcts and small old right basal ganglia lacunar infarcts. There are also old bilateral thalamic lacunar infarcts. No intracranial hemorrhage, mass lesion or CT evidence of acute infarction. Lacunar infarcts. Vascular: No hyperdense vessel or unexpected calcification. Skull: Normal. Negative for fracture or focal lesion. Sinuses/Orbits: No acute finding. Other: None. IMPRESSION: 1. No acute abnormality. 2. Mildly progressive atrophy and chronic small vessel white matter ischemic changes. 3. Old bilateral basal ganglia and thalamic lacunar infarcts. Electronically Signed   By: Beckie Salts M.D.   On: 12/15/2016 14:18    Assessment/Plan  UTI Patient is on Cipro for 3 days. She is asymptomatic right now. No fever  Essential hypertension Her BP stable on clonidine. Dont know why patient is on Clonidine and when was it started.  Cerebrovascular accident  Patient with multiple CVA. She is on Aggrenox.  Recurrent falls Patient has had number of falls recently. She is walking around in the facility and is very confused which is per husband like that at home also.  She stays very high risk for falls.  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 Will start her on Aricept. Aldo Seroquel at night PRN to avoid issues with behavior at night. Also  D/w the speech therapist patient has significant cognitive dysfunction.  Hyperlipidemia Patient on Lipitor Check lipid panel. Goal LDL for her should be less then 100.   Family/ staff Communication:   Labs/tests ordered: BMP, CBC, Fasting lipid Profile

## 2016-12-20 LAB — CULTURE, BLOOD (ROUTINE X 2)
CULTURE: NO GROWTH
CULTURE: NO GROWTH

## 2016-12-21 ENCOUNTER — Non-Acute Institutional Stay (SKILLED_NURSING_FACILITY): Payer: 59 | Admitting: Internal Medicine

## 2016-12-21 ENCOUNTER — Encounter (HOSPITAL_COMMUNITY): Payer: Self-pay | Admitting: Emergency Medicine

## 2016-12-21 ENCOUNTER — Emergency Department (HOSPITAL_COMMUNITY): Payer: 59

## 2016-12-21 ENCOUNTER — Emergency Department (HOSPITAL_COMMUNITY)
Admission: EM | Admit: 2016-12-21 | Discharge: 2016-12-21 | Disposition: A | Discharge status: Home / Self Care | Payer: 59 | Attending: Emergency Medicine | Admitting: Emergency Medicine

## 2016-12-21 DIAGNOSIS — Z8673 Personal history of transient ischemic attack (TIA), and cerebral infarction without residual deficits: Secondary | ICD-10-CM | POA: Diagnosis not present

## 2016-12-21 DIAGNOSIS — R55 Syncope and collapse: Secondary | ICD-10-CM

## 2016-12-21 DIAGNOSIS — Z79899 Other long term (current) drug therapy: Secondary | ICD-10-CM | POA: Diagnosis not present

## 2016-12-21 DIAGNOSIS — F0390 Unspecified dementia without behavioral disturbance: Secondary | ICD-10-CM | POA: Insufficient documentation

## 2016-12-21 DIAGNOSIS — F0151 Vascular dementia with behavioral disturbance: Secondary | ICD-10-CM

## 2016-12-21 DIAGNOSIS — F01518 Vascular dementia, unspecified severity, with other behavioral disturbance: Secondary | ICD-10-CM

## 2016-12-21 DIAGNOSIS — N289 Disorder of kidney and ureter, unspecified: Secondary | ICD-10-CM

## 2016-12-21 DIAGNOSIS — I10 Essential (primary) hypertension: Secondary | ICD-10-CM | POA: Diagnosis not present

## 2016-12-21 DIAGNOSIS — R4189 Other symptoms and signs involving cognitive functions and awareness: Secondary | ICD-10-CM | POA: Diagnosis not present

## 2016-12-21 LAB — COMPREHENSIVE METABOLIC PANEL
ALT: 24 U/L (ref 14–54)
ANION GAP: 6 (ref 5–15)
AST: 26 U/L (ref 15–41)
Albumin: 3.3 g/dL — ABNORMAL LOW (ref 3.5–5.0)
Alkaline Phosphatase: 53 U/L (ref 38–126)
BUN: 23 mg/dL — ABNORMAL HIGH (ref 6–20)
CHLORIDE: 104 mmol/L (ref 101–111)
CO2: 27 mmol/L (ref 22–32)
Calcium: 9.1 mg/dL (ref 8.9–10.3)
Creatinine, Ser: 1.31 mg/dL — ABNORMAL HIGH (ref 0.44–1.00)
GFR calc non Af Amer: 43 mL/min — ABNORMAL LOW (ref >60)
GFR, EST AFRICAN AMERICAN: 50 mL/min — AB (ref >60)
Glucose, Bld: 101 mg/dL — ABNORMAL HIGH (ref 65–99)
POTASSIUM: 4.4 mmol/L (ref 3.5–5.1)
SODIUM: 137 mmol/L (ref 135–145)
Total Bilirubin: 0.3 mg/dL (ref 0.3–1.2)
Total Protein: 6.3 g/dL — ABNORMAL LOW (ref 6.5–8.1)

## 2016-12-21 LAB — URINALYSIS, ROUTINE W REFLEX MICROSCOPIC
Bilirubin Urine: NEGATIVE
Glucose, UA: NEGATIVE mg/dL
Hgb urine dipstick: NEGATIVE
Ketones, ur: NEGATIVE mg/dL
LEUKOCYTES UA: NEGATIVE
NITRITE: NEGATIVE
PH: 5.5 (ref 5.0–8.0)
Protein, ur: NEGATIVE mg/dL
SPECIFIC GRAVITY, URINE: 1.025 (ref 1.005–1.030)

## 2016-12-21 LAB — CBC WITH DIFFERENTIAL/PLATELET
BASOS PCT: 0 %
Basophils Absolute: 0 10*3/uL (ref 0.0–0.1)
Eosinophils Absolute: 0 10*3/uL (ref 0.0–0.7)
Eosinophils Relative: 1 %
HEMATOCRIT: 34.7 % — AB (ref 36.0–46.0)
HEMOGLOBIN: 11.5 g/dL — AB (ref 12.0–15.0)
LYMPHS ABS: 0.7 10*3/uL (ref 0.7–4.0)
Lymphocytes Relative: 13 %
MCH: 30.7 pg (ref 26.0–34.0)
MCHC: 33.1 g/dL (ref 30.0–36.0)
MCV: 92.5 fL (ref 78.0–100.0)
MONOS PCT: 6 %
Monocytes Absolute: 0.4 10*3/uL (ref 0.1–1.0)
NEUTROS ABS: 4.6 10*3/uL (ref 1.7–7.7)
NEUTROS PCT: 80 %
Platelets: 235 10*3/uL (ref 150–400)
RBC: 3.75 MIL/uL — AB (ref 3.87–5.11)
RDW: 14.4 % (ref 11.5–15.5)
WBC: 5.8 10*3/uL (ref 4.0–10.5)

## 2016-12-21 LAB — TROPONIN I

## 2016-12-21 MED ORDER — SODIUM CHLORIDE 0.9 % IV BOLUS (SEPSIS)
500.0000 mL | Freq: Once | INTRAVENOUS | Status: AC
  Administered 2016-12-21: 500 mL via INTRAVENOUS

## 2016-12-21 NOTE — ED Provider Notes (Signed)
[facility]-EMERGENCY DEPT Provider Note   CSN: [id number] Arrival date & time: 12/21/16  0820   By signing my name below, I, Talbert NanPaul Grant, attest that this documentation has been prepared under the direction and in the presence of Benjiman CoreNathan Savannah Morford, MD. Electronically Signed: Talbert NanPaul Grant, Scribe. 12/21/16. 8:46 AM.   History   Chief Complaint Chief Complaint  Patient presents with  . Loss of Consciousness   LEVEL 5 CAVEAT: HPI and ROS limited due to dementia.   HPI HPI Comments: Maria Ortega is a 60 y.o. female with h/o dementia, CVA, and HTN who was brought to the Emergency Department by EMS from [facility] for evaluation of possible syncope PTA. Per SNF patient was found slumped over in wheel chair. There is no known fall. Patient does not remember LOC and currently has no complaints of pain. Patient was hospitalized 12/18/16 for a UTI and worsening dementia. Patient lived at home before being discharged to [facility] after this diagnosis.   The history is provided by the patient and the nursing home. History limited by: dementia. No language interpreter was used.    Past Medical History:  Diagnosis Date  . High cholesterol   . Hypertension   . Stroke [facility](HCC)     Patient Active Problem List   Diagnosis Date Noted  . CVA (cerebral vascular accident) (HCC) 12/19/2016  . Recurrent falls 12/19/2016  . Dementia 12/19/2016  . UTI (urinary tract infection) 12/15/2016  . Lactic acidosis 12/15/2016  . Weakness 12/15/2016  . Hypertension   . History of stroke   . High cholesterol     History reviewed. No pertinent surgical history.  OB History    No data available       Home Medications    Prior to Admission medications   Medication Sig Start Date End Date Taking? Authorizing Provider  amLODipine (NORVASC) 5 MG tablet Take 1 tablet (5 mg total) by mouth daily. 12/19/16  Yes Belkys A Regalado, MD  atorvastatin (LIPITOR) 10 MG tablet Take 1 tablet by mouth daily.  12/11/16  Yes Historical Provider, MD  Chlorpheniramine-Acetaminophen (CORICIDIN HBP COLD/FLU PO) Take 2 tablets by mouth daily as needed.   Yes Historical Provider, MD  ciprofloxacin (CIPRO) 500 MG tablet Take 1 tablet (500 mg total) by mouth 2 (two) times daily. 12/18/16  Yes Belkys A Regalado, MD  cloNIDine (CATAPRES) 0.2 MG tablet Take 1 tablet by mouth 2 (two) times daily. 12/06/16  Yes Historical Provider, MD  dipyridamole-aspirin (AGGRENOX) 200-25 MG 12hr capsule TK 1 C PO BID 12/14/15  Yes Historical Provider, MD  donepezil (ARICEPT) 5 MG tablet Take 5 mg by mouth at bedtime.   Yes Historical Provider, MD  feeding supplement, ENSURE ENLIVE, (ENSURE ENLIVE) LIQD Take 237 mLs by mouth 2 (two) times daily between meals. 12/18/16  Yes Belkys A Regalado, MD  metoprolol tartrate (LOPRESSOR) 25 MG tablet Take 1 tablet (25 mg total) by mouth 2 (two) times daily. 12/18/16  Yes Belkys A Regalado, MD  QUEtiapine (SEROQUEL) 25 MG tablet Take 12.5 mg by mouth at bedtime.   Yes Historical Provider, MD  vitamin B-12 100 MCG tablet Take 1 tablet (100 mcg total) by mouth daily. 12/18/16  Yes Alba CoryBelkys A Regalado, MD    Family History History reviewed. No pertinent family history.  Social History Social History  Substance Use Topics  . Smoking status: Never Smoker  . Smokeless tobacco: Never Used  . Alcohol use No     Allergies  Patient has no known allergies.   Review of Systems Review of Systems  Unable to perform ROS: Dementia     Physical Exam Updated Vital Signs BP 135/73   Pulse 68   Temp 97.5 F (36.4 C) (Oral)   Resp 19   Ht 5\' 8"  (1.727 m)   Wt 165 lb (74.8 kg)   SpO2 100%   BMI 25.09 kg/m   Physical Exam  Constitutional: She appears well-developed.  HENT:  Head: Normocephalic and atraumatic.  Tongue is dry.  Eyes: EOM are normal.  Left eye lid droop.  Cardiovascular: Normal rate.   Pulmonary/Chest: Effort normal. She has no wheezes. She has no rales.  Lungs sound  mildly harsh.  Abdominal: Soft. Bowel sounds are normal. There is no tenderness.  Musculoskeletal:  Ecchymosis on right forearm that looks old. Mild bilateral lower extremity pitting edema.  Neurological: She is alert.  Grip strength 5/5 bilaterally.  Skin: Skin is warm and dry.  Nursing note and vitals reviewed.    ED Treatments / Results   DIAGNOSTIC STUDIES: Oxygen Saturation is 100% on room air, normal by my interpretation.    COORDINATION OF CARE: 8:30 AM Will order lab work and chest xray.   Labs (all labs ordered are listed, but only abnormal results are displayed) Labs Reviewed  COMPREHENSIVE METABOLIC PANEL - Abnormal; Notable for the following:       Result Value   Glucose, Bld 101 (*)    BUN 23 (*)    Creatinine, Ser 1.31 (*)    Total Protein 6.3 (*)    Albumin 3.3 (*)    GFR calc non Af Amer 43 (*)    GFR calc Af Amer 50 (*)    All other components within normal limits  CBC WITH DIFFERENTIAL/PLATELET - Abnormal; Notable for the following:    RBC 3.75 (*)    Hemoglobin 11.5 (*)    HCT 34.7 (*)    All other components within normal limits  TROPONIN I  URINALYSIS, ROUTINE W REFLEX MICROSCOPIC    EKG  EKG Interpretation  Date/Time:  Friday December 21 2016 08:22:56 EST Ventricular Rate:  56 PR Interval:    QRS Duration: 128 QT Interval:  470 QTC Calculation: 454 R Axis:   55 Text Interpretation:  Sinus rhythm Left atrial enlargement IVCD, consider atypical RBBB Baseline wander in lead(s) V6 Confirmed by Rubin Payor  MD, Modesto Ganoe [phone]) on 12/21/2016 8:27:42 AM       Radiology Dg Chest 2 View  Result Date: 12/21/2016 CLINICAL DATA:  Altered mental status. EXAM: CHEST  2 VIEW COMPARISON:  12/15/2016 FINDINGS: Atherosclerotic aortic arch. Upper normal heart size given the AP projection. Dextroconvex upper thoracic scoliosis. The lungs appear clear. IMPRESSION: 1.  No active cardiopulmonary disease is radiographically apparent. 2. Atherosclerotic aortic  arch. 3. Dextroconvex upper thoracic scoliosis. Electronically Signed   By: Gaylyn Rong M.D.   On: 12/21/2016 09:19    Procedures Procedures (including critical care time)  Medications Ordered in ED Medications  sodium chloride 0.9 % bolus 500 mL (500 mLs Intravenous New Bag/Given 12/21/16 1035)     Initial Impression / Assessment and Plan / ED Course  I have reviewed the triage vital signs and the nursing notes.  Pertinent labs & imaging results that were available during my care of the patient were reviewed by me and considered in my medical decision making (see chart for details).  Clinical Course     Patient with syncopal episode a nursing home. Mild  bradycardia but otherwise vitals reassuring. Patient feels better. Has reportedly had little more sedation over last couple days. May be related to medicine or possibly dehydration. Creatinine is mildly increased. Urine does not show infection and she is currently on antibiotics. Will discharge back to the nursing home. May need further adjustments of her medications.  Final Clinical Impressions(s) / ED Diagnoses   Final diagnoses:  Syncope, unspecified syncope type  Renal insufficiency    New Prescriptions New Prescriptions   No medications on file   I personally performed the services described in this documentation, which was scribed in my presence. The recorded information has been reviewed and is accurate.       Benjiman CoreNathan Waynette Towers, MD 12/21/16 [phone]

## 2016-12-21 NOTE — Progress Notes (Signed)
This is an acute visit.  Level care skilled facility is [facility].  Chief complaint acute visit follow-up unresponsive episode resulting in ER visit.  History of present illness.  Patient is a pleasant 60 year old female recently admitted here for therapy.  She has a history of multiple CVAs hypertension hyperlipidemia and recurrent falls with significant cognitive impairment.  She had frequent falls and was brought to the ER and admitted with UTI she is being treated for Klebsiella pneumonia UTI and is finishing treatment for that.  CT scan of her head did not show any acute process.  She has been started on Aricept as well as Seroquel secondary to nighttime agitation.  Apparently this morning she was found slumped over and unresponsive EMS was called and she was transported to the ER however by the time she got to the ER apparently she was back at her baseline alert responsive-workup in the ER was relatively benign her creatinine was 1.3 which is slightly above her baseline-urinalysis appeared to be quite negative as well as chest x-ray.  She is back at facility today and at her baseline ambulating in a wheelchair pleasantly confused vital signs are stable nursing does not report any further instances of syncope unresponsiveness  Past Medical History:  Diagnosis Date  . High cholesterol   . Hypertension   . Stroke [facility](HCC)         Patient Active Problem List   Diagnosis Date Noted  . CVA (cerebral vascular accident) (HCC) 12/19/2016  . Recurrent falls 12/19/2016  . Dementia 12/19/2016  . UTI (urinary tract infection) 12/15/2016  . Lactic acidosis 12/15/2016  . Weakness 12/15/2016  . Hypertension   . History of stroke   . High cholesterol     History reviewed. No pertinent surgical history.     OB History    No data available       Home Medications                      Prior to Admission medications   Medication Sig Start Date End Date  Taking? Authorizing Provider  amLODipine (NORVASC) 5 MG tablet Take 1 tablet (5 mg total) by mouth daily. 12/19/16  Yes Belkys A Regalado, MD  atorvastatin (LIPITOR) 10 MG tablet Take 1 tablet by mouth daily. 12/11/16  Yes Historical Provider, MD  Chlorpheniramine-Acetaminophen (CORICIDIN HBP COLD/FLU PO) Take 2 tablets by mouth daily as needed.   Yes Historical Provider, MD  ciprofloxacin (CIPRO) 500 MG tablet Take 1 tablet (500 mg total) by mouth 2 (two) times daily. 12/18/16  Yes Belkys A Regalado, MD  cloNIDine (CATAPRES) 0.2 MG tablet Take 1 tablet by mouth 2 (two) times daily. 12/06/16  Yes Historical Provider, MD  dipyridamole-aspirin (AGGRENOX) 200-25 MG 12hr capsule TK 1 C PO BID 12/14/15  Yes Historical Provider, MD  donepezil (ARICEPT) 5 MG tablet Take 5 mg by mouth at bedtime.   Yes Historical Provider, MD  feeding supplement, ENSURE ENLIVE, (ENSURE ENLIVE) LIQD Take 237 mLs by mouth 2 (two) times daily between meals. 12/18/16  Yes Belkys A Regalado, MD  metoprolol tartrate (LOPRESSOR) 25 MG tablet Take 1 tablet (25 mg total) by mouth 2 (two) times daily. 12/18/16  Yes Belkys A Regalado, MD  QUEtiapine (SEROQUEL) 25 MG tablet Take 12.5 mg by mouth at bedtime.   Yes Historical Provider, MD  vitamin B-12 100 MCG tablet Take 1 tablet (100 mcg total) by mouth daily. 12/18/16  Yes Alba CoryBelkys A Regalado, MD  Family History History reviewed. No pertinent family history.  Social History     Social History  Substance Use Topics  . Smoking status: Never Smoker  . Smokeless tobacco: Never Used  . Alcohol use No     Allergies           Patient has no known allergies.   Review of Systems Review of Systems  Unable to perform ROS: Dementia     Physical Exam Temperature 98.6 pulse 86 respirations 20 blood pressure 143/79     Constitutional: She appears well-developed. Sitting comfortably in her wheelchair she is alert responsive smiling but confused HENT:   Head: Normocephalic and atraumatic.   Eyes: EOM are normal. Pupils are reactive to light  Left eye lid droop.  Cardiovascular: Normal rate.   Pulmonary/Chest: Effort normal. She has no wheezes. She has no rales.   Abdominal: Soft. Bowel sounds are normal. There is no tenderness.  Musculoskeletal:  Ecchymosis on right forearm that looks old. Mild bilateral lower extremity pitting edema.  Neurological: She is alert.  Grip strength 5/5 bilaterally.  moves all some East 4 ambulates in a wheelchair no lateralizing findings  Skin: Skin is warm and dry.  .   Labs.  12/21/2016.  WBC 5.8 hemoglobin 11.5 platelets 235.  Sodium 137 potassium 4.4 BUN 23 creatinine 1.31.  Albumin 3.3-troponin was negative-EKG showed normal sinus rhythm slightly bradycardic.  Assessment and plan.  Episode of unresponsiveness apparently this was quite dramatic but of short duration by the time she got to the ER she was back to her normal status-one would wonder possibly about a TIA-she does have a history of CVA she is on anticoagulation Aggrenox she is also on a statin at this point will monitor with vital signs neuro checks every shift for 48 hours  She also has some mild renal insufficiency with creatinine 1.3 previously with 0.7 to she did receive some IV fluids in the ER-will check a metabolic panel in the morning-also encourage fluids.  Of note she has been started on Seroquel at night she's also on Aricept although I suspect this was somewhat unlikely the etiology of her unresponsiveness this morning-we will continue to monitor at this point she does have significant dementia and apparently agitation at night.  .  CPT-is [id number]

## 2016-12-21 NOTE — ED Triage Notes (Signed)
Per EMS: [facility] from [facility], had syncopal episode in wheelchair without fall this morning.  Pt is altered, confused at this time. Pt has hx TIA.  cbg 127, 97/57, 58

## 2016-12-21 NOTE — Discharge Instructions (Signed)
Follow-up with Dr. Phillips OdorGolding for further monitoring of your creatinine, which is increased to 1.3. He would seem to little more sedate. Some of your medicines such as Seroquel may contribute to this also.

## 2016-12-21 NOTE — ED Notes (Signed)
Pt taken to Xray.

## 2016-12-21 NOTE — ED Notes (Signed)
Pt back from X-ray.  

## 2016-12-23 ENCOUNTER — Encounter (HOSPITAL_COMMUNITY)
Admission: AD | Admit: 2016-12-23 | Discharge: 2016-12-23 | Disposition: A | Discharge status: Home / Self Care | Payer: 59 | Source: Skilled Nursing Facility | Attending: Internal Medicine | Admitting: Internal Medicine

## 2016-12-23 DIAGNOSIS — I10 Essential (primary) hypertension: Secondary | ICD-10-CM | POA: Insufficient documentation

## 2016-12-23 LAB — BASIC METABOLIC PANEL
ANION GAP: 9 (ref 5–15)
BUN: 23 mg/dL — ABNORMAL HIGH (ref 6–20)
CO2: 25 mmol/L (ref 22–32)
Calcium: 9.4 mg/dL (ref 8.9–10.3)
Chloride: 104 mmol/L (ref 101–111)
Creatinine, Ser: 1.17 mg/dL — ABNORMAL HIGH (ref 0.44–1.00)
GFR, EST AFRICAN AMERICAN: 57 mL/min — AB (ref >60)
GFR, EST NON AFRICAN AMERICAN: 49 mL/min — AB (ref >60)
Glucose, Bld: 118 mg/dL — ABNORMAL HIGH (ref 65–99)
POTASSIUM: 4.5 mmol/L (ref 3.5–5.1)
SODIUM: 138 mmol/L (ref 135–145)

## 2016-12-26 ENCOUNTER — Encounter (HOSPITAL_COMMUNITY)
Admission: RE | Admit: 2016-12-26 | Discharge: 2016-12-26 | Disposition: A | Discharge status: Home / Self Care | Payer: 59 | Source: Skilled Nursing Facility | Attending: Internal Medicine | Admitting: Internal Medicine

## 2016-12-26 DIAGNOSIS — N390 Urinary tract infection, site not specified: Secondary | ICD-10-CM | POA: Insufficient documentation

## 2016-12-26 DIAGNOSIS — Z8673 Personal history of transient ischemic attack (TIA), and cerebral infarction without residual deficits: Secondary | ICD-10-CM | POA: Insufficient documentation

## 2016-12-26 DIAGNOSIS — I10 Essential (primary) hypertension: Secondary | ICD-10-CM | POA: Insufficient documentation

## 2016-12-26 DIAGNOSIS — Z9181 History of falling: Secondary | ICD-10-CM | POA: Insufficient documentation

## 2016-12-26 LAB — LIPID PANEL
CHOL/HDL RATIO: 3.7 ratio
Cholesterol: 200 mg/dL (ref 0–200)
HDL: 54 mg/dL (ref >40)
LDL Cholesterol: 103 mg/dL — ABNORMAL HIGH (ref 0–99)
Triglycerides: 215 mg/dL — ABNORMAL HIGH (ref <150)
VLDL: 43 mg/dL — AB (ref 0–40)

## 2016-12-26 LAB — BASIC METABOLIC PANEL
ANION GAP: 10 (ref 5–15)
BUN: 20 mg/dL (ref 6–20)
CHLORIDE: 103 mmol/L (ref 101–111)
CO2: 24 mmol/L (ref 22–32)
Calcium: 9.6 mg/dL (ref 8.9–10.3)
Creatinine, Ser: 1 mg/dL (ref 0.44–1.00)
GFR calc Af Amer: >60 mL/min (ref >60)
GFR calc non Af Amer: 60 mL/min — ABNORMAL LOW (ref >60)
GLUCOSE: 102 mg/dL — AB (ref 65–99)
POTASSIUM: 4 mmol/L (ref 3.5–5.1)
Sodium: 137 mmol/L (ref 135–145)

## 2016-12-26 LAB — CBC
HEMATOCRIT: 42.2 % (ref 36.0–46.0)
HEMOGLOBIN: 13.9 g/dL (ref 12.0–15.0)
MCH: 30.6 pg (ref 26.0–34.0)
MCHC: 32.9 g/dL (ref 30.0–36.0)
MCV: 93 fL (ref 78.0–100.0)
Platelets: 364 10*3/uL (ref 150–400)
RBC: 4.54 MIL/uL (ref 3.87–5.11)
RDW: 14.4 % (ref 11.5–15.5)
WBC: 7.5 10*3/uL (ref 4.0–10.5)

## 2016-12-27 ENCOUNTER — Non-Acute Institutional Stay (SKILLED_NURSING_FACILITY): Payer: 59 | Admitting: Internal Medicine

## 2016-12-27 ENCOUNTER — Encounter: Payer: Self-pay | Admitting: Internal Medicine

## 2016-12-27 DIAGNOSIS — I639 Cerebral infarction, unspecified: Secondary | ICD-10-CM | POA: Diagnosis not present

## 2016-12-27 DIAGNOSIS — I10 Essential (primary) hypertension: Secondary | ICD-10-CM

## 2016-12-27 DIAGNOSIS — N3000 Acute cystitis without hematuria: Secondary | ICD-10-CM

## 2016-12-27 DIAGNOSIS — F0151 Vascular dementia with behavioral disturbance: Secondary | ICD-10-CM | POA: Diagnosis not present

## 2016-12-27 DIAGNOSIS — F01518 Vascular dementia, unspecified severity, with other behavioral disturbance: Secondary | ICD-10-CM

## 2016-12-27 NOTE — Progress Notes (Signed)
Location:   [facility] Room Number: 132/P Place of Service:  SNF (31)  Provider: Mussa Groesbeck,Brocha Gilliam  PCP: Colette RibasGOLDING, JOHN CABOT, MD Patient Care Team: Assunta FoundJohn Golding, MD as PCP - General (Family Medicine)  Extended Emergency Contact Information Primary Emergency Contact: Brands,Donald Address: [address]          [place], [place] [address] [place] Home Phone: [phone] Relation: Spouse  Code Status: Full Code Goals of care:  Advanced Directive information Advanced Directives 12/27/2016  Does Patient Have a Medical Advance Directive? Yes  Type of Advance Directive (No Data)  Does patient want to make changes to medical advance directive? No - Patient declined  Would patient like information on creating a medical advance directive? No - Patient declined     No Known Allergies  Chief Complaint  Patient presents with  . Discharge Note    HPI:  60 y.o. female seen today for discharge later this week  She was admitted here for therapy after hospitalization for UTI-.  She was sent to the ER and diagnosed with a UTI after increased weakness and falls at home apparently.  CT scan of the head did not show any acute process she was treated with Rocephin and then changed to oral Cipro on discharge she has completed her antibiotic.  She does have a history of multiple CVAs hypertension hyperlipidemia and recurrent falls.  She does have significant dementia and apparently initially and skilled nursing had significant agitation especially at night.  She has been started on Seroquel as well as Aricept and apparently this is helping some.  She will be going home with her husband.  Regards to CVA she is on Aggrenox and a statin.  She was seen several days ago after an ER visit for a unresponsive episode-no clear etiology was found in fact by the time she got to the ER she was back at her baseline alert responsive confused.  Workup in the ER  including urinalysis appeared to be negative.  She did receive apparently some IV fluids.  Labs were fairly unremarkable although additional her creatinine was somewhat above baseline at 1.3 however updated lab done yesterday shows this is more normalized now at 1.0 with a BUN of 20.  Vital signs been stable at times she does have somewhat elevated systolics but this appears variable she is on Norvasc 5 mg a day clonidine 0.2 mg twice a day and Lopressor 25 mg twice a day recent blood pressures 139/84-143/79-157/78-since she is about to go home will not be aggressive aching medication changes.  Currently she is sitting in her wheelchair comfortably she does not have any acute complaints although again she is a poor historian nursing staff does not report any acute issues.       Past Medical History:  Diagnosis Date  . High cholesterol   . Hypertension   . Stroke [facility](HCC)     History reviewed. No pertinent surgical history.    reports that she has never smoked. She has never used smokeless tobacco. She reports that she does not drink alcohol or use drugs. Social History   Social History  . Marital status: Married    Spouse name: N/A  . Number of children: N/A  . Years of education: N/A   Occupational History  . Not on file.   Social History Main Topics  . Smoking status: Never Smoker  . Smokeless tobacco: Never Used  . Alcohol use No  . Drug use:  No  . Sexual activity: Not on file   Other Topics Concern  . Not on file   Social History Narrative  . No narrative on file   Functional Status Survey:    No Known Allergies  Pertinent  Health Maintenance Due  Topic Date Due  . PAP SMEAR  01/12/1976  . MAMMOGRAM  01/11/2005  . COLONOSCOPY  01/11/2005  . INFLUENZA VACCINE  07/31/2016    Medications: Allergies as of 12/27/2016   No Known Allergies     Medication List       Accurate as of 12/27/16  1:38 PM. Always use your most recent med list.            amLODipine 5 MG tablet Commonly known as:  NORVASC Take 1 tablet (5 mg total) by mouth daily.   atorvastatin 10 MG tablet Commonly known as:  LIPITOR Take 1 tablet by mouth daily.   cloNIDine 0.2 MG tablet Commonly known as:  CATAPRES Take 1 tablet by mouth 2 (two) times daily.   CORICIDIN HBP COLD/FLU PO Take 2 tablets by mouth daily as needed.   cyanocobalamin 100 MCG tablet Take 1 tablet (100 mcg total) by mouth daily.   dipyridamole-aspirin 200-25 MG 12hr capsule Commonly known as:  AGGRENOX TK 1 C PO BID   donepezil 5 MG tablet Commonly known as:  ARICEPT Take 5 mg by mouth at bedtime.   feeding supplement (ENSURE ENLIVE) Liqd Take 237 mLs by mouth 2 (two) times daily between meals.   metoprolol tartrate 25 MG tablet Commonly known as:  LOPRESSOR Take 1 tablet (25 mg total) by mouth 2 (two) times daily.   QUEtiapine 25 MG tablet Commonly known as:  SEROQUEL Take 12.5 mg by mouth at bedtime.       Review of Systems   Unattainable secondary to dementia please see history of present illness  Vitals:   12/27/16 1149  BP: (!) 157/78  Pulse: 81  Resp: 18  Temp: 97.4 F (36.3 C)  TempSrc: Oral  Note recent blood pressures are variable as noted above  Physical Exam   Constitutional: She appears well-developed. Sitting comfortably in her wheelchair she is alert responsive smiling but confused-- HENT:  Head: Normocephalicand atraumatic.   Eyes: EOMare normal. Pupils are reactive to light  Left eye lid droop. She does have prescription lenses Cardiovascular: Normal rate. Regular rhythm Pulmonary/Chest: Effort normal. She has no wheezes. She has no rales.   Abdominal: Soft. Bowel sounds are normal. There is no tenderness.  Musculoskeletal:  Ecchymosis on right forearm that looks old. Mild bilateral lower extremity edema. Neurological: She is alert.  Grip strength 5/5 bilaterally. moves all some extremities 4 ambulates in a wheelchair no  lateralizing findings  Skin: Skin is warmand dry.  .  Labs reviewed: Basic Metabolic Panel:  Recent Labs  16/10/96 0912 12/23/16 1315 12/26/16 0700  NA 137 138 137  K 4.4 4.5 4.0  CL 104 104 103  CO2 27 25 24   GLUCOSE 101* 118* 102*  BUN 23* 23* 20  CREATININE 1.31* 1.17* 1.00  CALCIUM 9.1 9.4 9.6   Liver Function Tests:  Recent Labs  12/15/16 1330 12/21/16 0912  AST 24 26  ALT 12* 24  ALKPHOS 64 53  BILITOT 0.5 0.3  PROT 7.0 6.3*  ALBUMIN 3.8 3.3*   No results for input(s): LIPASE, AMYLASE in the last 8760 hours. No results for input(s): AMMONIA in the last 8760 hours. CBC:  Recent Labs  12/15/16 1330  12/17/16 0439 12/21/16 0912 12/26/16 0700  WBC 8.6  < > 6.8 5.8 7.5  NEUTROABS 7.7  --   --  4.6  --   HGB 11.8*  < > 12.9 11.5* 13.9  HCT 35.7*  < > 38.4 34.7* 42.2  MCV 92.7  < > 91.4 92.5 93.0  PLT 180  < > 180 235 364  < > = values in this interval not displayed. Cardiac Enzymes:  Recent Labs  12/21/16 0912  TROPONINI <0.03   BNP: Invalid input(s): POCBNP CBG: No results for input(s): GLUCAP in the last 8760 hours.  Procedures and Imaging Studies During Stay: Dg Chest 1 View  Result Date: 12/15/2016 CLINICAL DATA:  Larey Seat this morning.  Decreased activity. EXAM: CHEST 1 VIEW COMPARISON:  10/20/2006. FINDINGS: Normal sized heart. Tortuous and calcified thoracic aorta. Clear lungs. Diffuse osteopenia. No fracture or pneumothorax. IMPRESSION: No acute abnormality.  Aortic atherosclerosis. Electronically Signed   By: Beckie Salts M.D.   On: 12/15/2016 14:18   Dg Chest 2 View  Result Date: 12/21/2016 CLINICAL DATA:  Altered mental status. EXAM: CHEST  2 VIEW COMPARISON:  12/15/2016 FINDINGS: Atherosclerotic aortic arch. Upper normal heart size given the AP projection. Dextroconvex upper thoracic scoliosis. The lungs appear clear. IMPRESSION: 1.  No active cardiopulmonary disease is radiographically apparent. 2. Atherosclerotic aortic arch. 3.  Dextroconvex upper thoracic scoliosis. Electronically Signed   By: Gaylyn Rong M.D.   On: 12/21/2016 09:19   Ct Head Wo Contrast  Result Date: 12/15/2016 CLINICAL DATA:  Decreased activity following a fall this morning. EXAM: CT HEAD WITHOUT CONTRAST TECHNIQUE: Contiguous axial images were obtained from the base of the skull through the vertex without intravenous contrast. COMPARISON:  01/03/2016. FINDINGS: Brain: Diffusely enlarged ventricles and subarachnoid spaces. Patchy white matter low density in both cerebral hemispheres. Old left basal ganglia lacunar infarcts and small old right basal ganglia lacunar infarcts. There are also old bilateral thalamic lacunar infarcts. No intracranial hemorrhage, mass lesion or CT evidence of acute infarction. Lacunar infarcts. Vascular: No hyperdense vessel or unexpected calcification. Skull: Normal. Negative for fracture or focal lesion. Sinuses/Orbits: No acute finding. Other: None. IMPRESSION: 1. No acute abnormality. 2. Mildly progressive atrophy and chronic small vessel white matter ischemic changes. 3. Old bilateral basal ganglia and thalamic lacunar infarcts. Electronically Signed   By: Beckie Salts M.D.   On: 12/15/2016 14:18    Assessment/Plan:   :   #1-UTI-she has completed course of Cipro for this she does not appear to be overtly symptomatic.  #2 history of frequent falls she has received therapy I suspect she will need continued PT at home-B12 was started in the hospital her TSH was normal in the hospital.  Was thought falls are likely related to her stroke history.  .  #3 history CVA-there have been multiple in the past she is on Aggrenox as well as a statin-again she did have an unresponsive episode several days ago but quickly returned back to her baseline one would consider possibly a TIA as the etiology-I suspect she will remain at risk for this.  #4 history hypertension-she is on numerous medications as noted above Norvasc was  added in the hospital she also continues on clonidine and Lopressor-has some variable blood pressures --since she is about to go home will defer to primary care   provider  #5 dementia with behaviors-she has been started on Aricept and Seroquel-apparently this is helping somewhat with agitation especially at night will defer follow-up to her primary  care provider she was pleasant and cooperative although confused with exam today.  #6 history renal insufficiency creatinine appears to be more at baseline on lab done yesterday at 1.0 will need periodic monitoring as an outpatient.  [id number]-of note greater than 30 minutes spent apparent this discharge summary-greater than 50% of time spent coordinating plan of care  Of note again she will need PT and OT-for further strengthening with her history of falls and dementia.   :    Patient has been advised to f/u with their PCP in 1-2 weeks to bring them up to date on their rehab stay.  Social services at facility was responsible for arranging this appointment.  Pt was provided with a 30 day supply of prescriptions for medications and refills must be obtained from their PCP.  For controlled substances, a more limited supply may be provided adequate until PCP appointment only.

## 2017-07-04 DIAGNOSIS — F0390 Unspecified dementia without behavioral disturbance: Secondary | ICD-10-CM | POA: Diagnosis not present

## 2017-07-04 DIAGNOSIS — E782 Mixed hyperlipidemia: Secondary | ICD-10-CM | POA: Diagnosis not present

## 2017-07-04 DIAGNOSIS — Z681 Body mass index (BMI) 19 or less, adult: Secondary | ICD-10-CM | POA: Diagnosis not present

## 2017-07-04 DIAGNOSIS — E46 Unspecified protein-calorie malnutrition: Secondary | ICD-10-CM | POA: Diagnosis not present

## 2017-07-04 DIAGNOSIS — I10 Essential (primary) hypertension: Secondary | ICD-10-CM | POA: Diagnosis not present

## 2017-07-04 DIAGNOSIS — Z1389 Encounter for screening for other disorder: Secondary | ICD-10-CM | POA: Diagnosis not present

## 2017-07-26 ENCOUNTER — Emergency Department (HOSPITAL_COMMUNITY): Payer: BLUE CROSS/BLUE SHIELD

## 2017-07-26 ENCOUNTER — Encounter (HOSPITAL_COMMUNITY): Payer: Self-pay | Admitting: Emergency Medicine

## 2017-07-26 ENCOUNTER — Emergency Department (HOSPITAL_COMMUNITY)
Admission: EM | Admit: 2017-07-26 | Discharge: 2017-07-26 | Disposition: A | Discharge status: Home / Self Care | Payer: BLUE CROSS/BLUE SHIELD | Attending: Emergency Medicine | Admitting: Emergency Medicine

## 2017-07-26 DIAGNOSIS — F0390 Unspecified dementia without behavioral disturbance: Secondary | ICD-10-CM | POA: Insufficient documentation

## 2017-07-26 DIAGNOSIS — R296 Repeated falls: Secondary | ICD-10-CM | POA: Diagnosis not present

## 2017-07-26 DIAGNOSIS — I10 Essential (primary) hypertension: Secondary | ICD-10-CM | POA: Diagnosis not present

## 2017-07-26 DIAGNOSIS — F4489 Other dissociative and conversion disorders: Secondary | ICD-10-CM | POA: Diagnosis not present

## 2017-07-26 DIAGNOSIS — E876 Hypokalemia: Secondary | ICD-10-CM | POA: Diagnosis not present

## 2017-07-26 DIAGNOSIS — I6789 Other cerebrovascular disease: Secondary | ICD-10-CM | POA: Diagnosis not present

## 2017-07-26 DIAGNOSIS — Z043 Encounter for examination and observation following other accident: Secondary | ICD-10-CM | POA: Diagnosis not present

## 2017-07-26 DIAGNOSIS — S4991XA Unspecified injury of right shoulder and upper arm, initial encounter: Secondary | ICD-10-CM | POA: Diagnosis not present

## 2017-07-26 DIAGNOSIS — R4182 Altered mental status, unspecified: Secondary | ICD-10-CM | POA: Diagnosis present

## 2017-07-26 DIAGNOSIS — R21 Rash and other nonspecific skin eruption: Secondary | ICD-10-CM | POA: Insufficient documentation

## 2017-07-26 DIAGNOSIS — Z79899 Other long term (current) drug therapy: Secondary | ICD-10-CM | POA: Diagnosis not present

## 2017-07-26 DIAGNOSIS — W19XXXA Unspecified fall, initial encounter: Secondary | ICD-10-CM

## 2017-07-26 DIAGNOSIS — Z8673 Personal history of transient ischemic attack (TIA), and cerebral infarction without residual deficits: Secondary | ICD-10-CM | POA: Diagnosis not present

## 2017-07-26 HISTORY — DX: Unspecified dementia, unspecified severity, without behavioral disturbance, psychotic disturbance, mood disturbance, and anxiety: F03.90

## 2017-07-26 LAB — URINALYSIS, ROUTINE W REFLEX MICROSCOPIC
Bilirubin Urine: NEGATIVE
Glucose, UA: NEGATIVE mg/dL
Ketones, ur: NEGATIVE mg/dL
Nitrite: NEGATIVE
Protein, ur: 30 mg/dL — AB
Specific Gravity, Urine: 1.01 (ref 1.005–1.030)
Squamous Epithelial / LPF: NONE SEEN
pH: 6 (ref 5.0–8.0)

## 2017-07-26 LAB — COMPREHENSIVE METABOLIC PANEL
ALT: 11 U/L — ABNORMAL LOW (ref 14–54)
AST: 17 U/L (ref 15–41)
Albumin: 4.3 g/dL (ref 3.5–5.0)
Alkaline Phosphatase: 73 U/L (ref 38–126)
Anion gap: 11 (ref 5–15)
BUN: 14 mg/dL (ref 6–20)
CO2: 27 mmol/L (ref 22–32)
Calcium: 9.4 mg/dL (ref 8.9–10.3)
Chloride: 103 mmol/L (ref 101–111)
Creatinine, Ser: 1.09 mg/dL — ABNORMAL HIGH (ref 0.44–1.00)
GFR calc Af Amer: >60 mL/min (ref >60)
GFR calc non Af Amer: 53 mL/min — ABNORMAL LOW (ref >60)
Glucose, Bld: 106 mg/dL — ABNORMAL HIGH (ref 65–99)
Potassium: 3.3 mmol/L — ABNORMAL LOW (ref 3.5–5.1)
Sodium: 141 mmol/L (ref 135–145)
Total Bilirubin: 0.8 mg/dL (ref 0.3–1.2)
Total Protein: 7.3 g/dL (ref 6.5–8.1)

## 2017-07-26 LAB — DIFFERENTIAL
BASOS ABS: 0 10*3/uL (ref 0.0–0.1)
BASOS PCT: 0 %
EOS ABS: 0 10*3/uL (ref 0.0–0.7)
EOS PCT: 0 %
LYMPHS ABS: 0.8 10*3/uL (ref 0.7–4.0)
Lymphocytes Relative: 12 %
MONOS PCT: 8 %
Monocytes Absolute: 0.5 10*3/uL (ref 0.1–1.0)
Neutro Abs: 5.2 10*3/uL (ref 1.7–7.7)
Neutrophils Relative %: 80 %

## 2017-07-26 LAB — CBC
HCT: 36.5 % (ref 36.0–46.0)
Hemoglobin: 12.6 g/dL (ref 12.0–15.0)
MCH: 31.8 pg (ref 26.0–34.0)
MCHC: 34.5 g/dL (ref 30.0–36.0)
MCV: 92.2 fL (ref 78.0–100.0)
Platelets: 231 10*3/uL (ref 150–400)
RBC: 3.96 MIL/uL (ref 3.87–5.11)
RDW: 12.7 % (ref 11.5–15.5)
WBC: 6.5 10*3/uL (ref 4.0–10.5)

## 2017-07-26 LAB — PROTIME-INR
INR: 0.96
Prothrombin Time: 12.7 seconds (ref 11.4–15.2)

## 2017-07-26 LAB — RAPID URINE DRUG SCREEN, HOSP PERFORMED
Amphetamines: NOT DETECTED
Barbiturates: NOT DETECTED
Benzodiazepines: NOT DETECTED
Cocaine: NOT DETECTED
Opiates: NOT DETECTED
Tetrahydrocannabinol: NOT DETECTED

## 2017-07-26 LAB — ETHANOL

## 2017-07-26 LAB — I-STAT TROPONIN, ED: TROPONIN I, POC: 0.03 ng/mL (ref 0.00–0.08)

## 2017-07-26 LAB — APTT: aPTT: 27 seconds (ref 24–36)

## 2017-07-26 MED ORDER — POTASSIUM CHLORIDE 20 MEQ/15ML (10%) PO SOLN
40.0000 meq | Freq: Once | ORAL | Status: AC
  Administered 2017-07-26: 40 meq via ORAL
  Filled 2017-07-26: qty 30

## 2017-07-26 NOTE — ED Triage Notes (Signed)
Pt arrived confused. Husband called for pt having multiple falls. Larey SeatFell this am. Pt callm and denies pain. Hx of alzheimers. Poor oral care noted. Husband states she needs SNF

## 2017-07-26 NOTE — ED Notes (Signed)
EDP notified of pt's current bp.  Spoke with pt's husband,  Husband states pt has another bp medication to take when she gets home.  Instructed husband to give pt medication tonight

## 2017-07-26 NOTE — ED Provider Notes (Addendum)
[facility]-EMERGENCY DEPT Provider Note   CSN: [id number] Arrival date & time: 07/26/17  1729     History   Chief Complaint Chief Complaint  Patient presents with  . Altered Mental Status    HPI Maria Ortega is a 61 y.o. female.  HPI level V caveat dementia history is obtained from patient's husband Patient fell in her home approximately 8 AM today. He reports that she had some difficulty speaking after the fall and she is now "more coherent that she had been earlier in the day." She had a similar episode 2 weeks ago. Patient is asymptomatic presently and states "I feel good" Past Medical History:  Diagnosis Date  . Dementia   . High cholesterol   . Hypertension   . Stroke [facility](HCC)     Patient Active Problem List   Diagnosis Date Noted  . CVA (cerebral vascular accident) (HCC) 12/19/2016  . Recurrent falls 12/19/2016  . Dementia 12/19/2016  . UTI (urinary tract infection) 12/15/2016  . Lactic acidosis 12/15/2016  . Weakness 12/15/2016  . Hypertension   . History of stroke   . High cholesterol     History reviewed. No pertinent surgical history.  OB History    No data available       Home Medications    Prior to Admission medications   Medication Sig Start Date End Date Taking? Authorizing Provider  amLODipine (NORVASC) 5 MG tablet Take 1 tablet (5 mg total) by mouth daily. 12/19/16   Regalado, Belkys A, MD  atorvastatin (LIPITOR) 10 MG tablet Take 1 tablet by mouth daily. 12/11/16   [provider]  Chlorpheniramine-Acetaminophen (CORICIDIN HBP COLD/FLU PO) Take 2 tablets by mouth daily as needed.    [provider]  cloNIDine (CATAPRES) 0.2 MG tablet Take 1 tablet by mouth 2 (two) times daily. 12/06/16   [provider]  dipyridamole-aspirin (AGGRENOX) 200-25 MG 12hr capsule TK 1 C PO BID 12/14/15   [provider]  donepezil (ARICEPT) 5 MG tablet Take 5 mg by mouth at bedtime.    [provider]  feeding supplement,  ENSURE ENLIVE, (ENSURE ENLIVE) LIQD Take 237 mLs by mouth 2 (two) times daily between meals. 12/18/16   Regalado, Belkys A, MD  metoprolol tartrate (LOPRESSOR) 25 MG tablet Take 1 tablet (25 mg total) by mouth 2 (two) times daily. 12/18/16   Regalado, Belkys A, MD  QUEtiapine (SEROQUEL) 25 MG tablet Take 12.5 mg by mouth at bedtime.    [provider]  vitamin B-12 100 MCG tablet Take 1 tablet (100 mcg total) by mouth daily. 12/18/16   Regalado, Prentiss BellsBelkys A, MD    Family History History reviewed. No pertinent family history.  Social History Social History  Substance Use Topics  . Smoking status: Never Smoker  . Smokeless tobacco: Never Used  . Alcohol use No     Allergies   Patient has no known allergies.   Review of Systems Review of Systems  Unable to perform ROS: Dementia  Musculoskeletal: Positive for gait problem.  Skin: Positive for rash.       Scabbed lesions on right arm from where she rubs against the walls when she walks per husband     Physical Exam Updated Vital Signs BP (!) 146/81 (BP Location: Left Arm)   Pulse 95   Temp 98.9 F (37.2 C) (Oral)   Resp 19   SpO2 99%   Physical Exam  Constitutional: She is oriented to person, place, and time.  Chronically ill-appearing  HENT:  Head: Normocephalic and atraumatic.  Eyes: Pupils are equal, round, and reactive to light. Conjunctivae are normal.  Neck: Neck supple. No tracheal deviation present. No thyromegaly present.  Cardiovascular: Normal rate and regular rhythm.   No murmur heard. Pulmonary/Chest: Effort normal and breath sounds normal.  Abdominal: Soft. Bowel sounds are normal. She exhibits no distension. There is no tenderness.  Musculoskeletal: Normal range of motion. She exhibits no edema or tenderness.  Neurological: She is alert and oriented to person, place, and time. No cranial nerve deficit. Coordination normal.  gait shuffling. Walks unassisted  Skin: Skin is warm and dry. No rash  noted.  Tiny abrasions over dorsum of right forearm otherwise without lesion  Psychiatric: She has a normal mood and affect.  Nursing note and vitals reviewed.    ED Treatments / Results  Labs (all labs ordered are listed, but only abnormal results are displayed) Labs Reviewed  ETHANOL  PROTIME-INR  APTT  CBC  DIFFERENTIAL  COMPREHENSIVE METABOLIC PANEL  RAPID URINE DRUG SCREEN, HOSP PERFORMED  URINALYSIS, ROUTINE W REFLEX MICROSCOPIC  COMPREHENSIVE METABOLIC PANEL  I-STAT TROPONIN, ED    EKG  EKG Interpretation  Date/Time:  Friday July 26 2017 19:31:12 EDT Ventricular Rate:  72 PR Interval:    QRS Duration: 129 QT Interval:  436 QTC Calculation: 478 R Axis:   83 Text Interpretation:  Sinus rhythm Right atrial enlargement Right bundle branch block Minimal ST elevation, anterior leads Baseline wander in lead(s) V5 No significant change since last tracing Confirmed by Doug SouJacubowitz, Yoneko Talerico [phone]([id number]) on 07/26/2017 7:56:13 PM     Patient passed swallowing screen  Radiology No results found.  Procedures Procedures (including critical care time)  Medications Ordered in ED Medications - No data to display   Initial Impression / Assessment and Plan / ED Course  I have reviewed the triage vital signs and the nursing notes.  Pertinent labs & imaging results that were available during my care of the patient were reviewed by me and considered in my medical decision making (see chart for details).     9:20 PM patient is alert awake and states she feels well. She looks at baseline per her husband. No evidence of stroke. Her husband elaborates to me that he did not plan on putting her in a nursing home tonight. He was concerned about the fall and lack of balance. I suggest she follow-up with Dr. Phillips OdorGolding on Monday, 07/29/2017. She received oral potassium supplementation while here Final Clinical Impressions(s) / ED Diagnoses  Dx #111fall #2Hypokalemia Final diagnoses:  None     New Prescriptions New Prescriptions   No medications on file     Doug SouJacubowitz, Yanisa Goodgame, MD 07/26/17 2128    Doug SouJacubowitz, Ansen Sayegh, MD 07/26/17 2130 10 PM patient's blood pressure noted be 210/111. Patient's husband states that she is due for her regularly scheduled blood pressure medication at 10 PM. I advised him to give her blood pressure medication upon arrival home   Doug SouJacubowitz, Tayra Dawe, MD 07/26/17 2203

## 2017-07-26 NOTE — Discharge Instructions (Signed)
Call Dr. Lamar BlinksGolding's office on Monday, 07/29/2017 to schedule the next available office appointment. He may be a help you with aids at home or placement of Ms. Effie ShyColeman into an assisted living or skilled nursing facility if you wish. Return if concern for any reason

## 2017-08-13 DIAGNOSIS — F0390 Unspecified dementia without behavioral disturbance: Secondary | ICD-10-CM | POA: Diagnosis not present

## 2017-08-17 ENCOUNTER — Emergency Department (HOSPITAL_COMMUNITY)
Admission: EM | Admit: 2017-08-17 | Discharge: 2017-08-17 | Disposition: A | Discharge status: Skilled Nursing Facility | Payer: BLUE CROSS/BLUE SHIELD | Attending: Emergency Medicine | Admitting: Emergency Medicine

## 2017-08-17 ENCOUNTER — Encounter (HOSPITAL_COMMUNITY): Payer: Self-pay | Admitting: *Deleted

## 2017-08-17 DIAGNOSIS — I10 Essential (primary) hypertension: Secondary | ICD-10-CM | POA: Diagnosis not present

## 2017-08-17 DIAGNOSIS — F0390 Unspecified dementia without behavioral disturbance: Secondary | ICD-10-CM | POA: Diagnosis not present

## 2017-08-17 DIAGNOSIS — R279 Unspecified lack of coordination: Secondary | ICD-10-CM | POA: Diagnosis not present

## 2017-08-17 DIAGNOSIS — N390 Urinary tract infection, site not specified: Secondary | ICD-10-CM | POA: Diagnosis not present

## 2017-08-17 DIAGNOSIS — E7800 Pure hypercholesterolemia, unspecified: Secondary | ICD-10-CM | POA: Insufficient documentation

## 2017-08-17 DIAGNOSIS — Z79899 Other long term (current) drug therapy: Secondary | ICD-10-CM | POA: Diagnosis not present

## 2017-08-17 DIAGNOSIS — Z8673 Personal history of transient ischemic attack (TIA), and cerebral infarction without residual deficits: Secondary | ICD-10-CM | POA: Insufficient documentation

## 2017-08-17 DIAGNOSIS — R4182 Altered mental status, unspecified: Secondary | ICD-10-CM | POA: Diagnosis present

## 2017-08-17 DIAGNOSIS — R402411 Glasgow coma scale score 13-15, in the field [EMT or ambulance]: Secondary | ICD-10-CM | POA: Diagnosis not present

## 2017-08-17 DIAGNOSIS — Z743 Need for continuous supervision: Secondary | ICD-10-CM | POA: Diagnosis not present

## 2017-08-17 DIAGNOSIS — F4489 Other dissociative and conversion disorders: Secondary | ICD-10-CM | POA: Diagnosis not present

## 2017-08-17 LAB — URINALYSIS, MICROSCOPIC (REFLEX)

## 2017-08-17 LAB — URINALYSIS, ROUTINE W REFLEX MICROSCOPIC
BILIRUBIN URINE: NEGATIVE
GLUCOSE, UA: NEGATIVE mg/dL
Ketones, ur: NEGATIVE mg/dL
NITRITE: NEGATIVE
Protein, ur: >300 mg/dL — AB
Specific Gravity, Urine: 1.025 (ref 1.005–1.030)
pH: 6 (ref 5.0–8.0)

## 2017-08-17 LAB — CBC
HEMATOCRIT: 32.8 % — AB (ref 36.0–46.0)
HEMOGLOBIN: 11 g/dL — AB (ref 12.0–15.0)
MCH: 31.9 pg (ref 26.0–34.0)
MCHC: 33.5 g/dL (ref 30.0–36.0)
MCV: 95.1 fL (ref 78.0–100.0)
Platelets: 204 10*3/uL (ref 150–400)
RBC: 3.45 MIL/uL — ABNORMAL LOW (ref 3.87–5.11)
RDW: 12.9 % (ref 11.5–15.5)
WBC: 4.6 10*3/uL (ref 4.0–10.5)

## 2017-08-17 LAB — COMPREHENSIVE METABOLIC PANEL
ALBUMIN: 3.5 g/dL (ref 3.5–5.0)
ALK PHOS: 52 U/L (ref 38–126)
ALT: 13 U/L — ABNORMAL LOW (ref 14–54)
ANION GAP: 7 (ref 5–15)
AST: 19 U/L (ref 15–41)
BILIRUBIN TOTAL: 0.6 mg/dL (ref 0.3–1.2)
BUN: 19 mg/dL (ref 6–20)
CALCIUM: 8.7 mg/dL — AB (ref 8.9–10.3)
CO2: 26 mmol/L (ref 22–32)
Chloride: 105 mmol/L (ref 101–111)
Creatinine, Ser: 1 mg/dL (ref 0.44–1.00)
GFR calc Af Amer: >60 mL/min (ref >60)
GFR, EST NON AFRICAN AMERICAN: 59 mL/min — AB (ref >60)
GLUCOSE: 139 mg/dL — AB (ref 65–99)
POTASSIUM: 4.1 mmol/L (ref 3.5–5.1)
Sodium: 138 mmol/L (ref 135–145)
TOTAL PROTEIN: 6.2 g/dL — AB (ref 6.5–8.1)

## 2017-08-17 LAB — CBG MONITORING, ED: GLUCOSE-CAPILLARY: 112 mg/dL — AB (ref 65–99)

## 2017-08-17 MED ORDER — DEXTROSE 5 % IV SOLN
1.0000 g | Freq: Once | INTRAVENOUS | Status: AC
  Administered 2017-08-17: 1 g via INTRAVENOUS
  Filled 2017-08-17: qty 10

## 2017-08-17 MED ORDER — CEPHALEXIN 500 MG PO CAPS: 500.0000 mg | ORAL_CAPSULE | Freq: Four times a day (QID) | ORAL | 0 refills | Status: DC

## 2017-08-17 MED ORDER — SODIUM CHLORIDE 0.9 % IV BOLUS (SEPSIS)
500.0000 mL | Freq: Once | INTRAVENOUS | Status: AC
  Administered 2017-08-17: 500 mL via INTRAVENOUS

## 2017-08-17 NOTE — ED Provider Notes (Signed)
[facility]-EMERGENCY DEPT Provider Note   CSN: [id number] Arrival date & time: 08/17/17  1191     History   Chief Complaint Chief Complaint  Patient presents with  . Altered Mental Status    HPI Maria Ortega is a 61 y.o. female.  Level V caveat for dementia. Patient resides in nursing home. Chief complaint is altered mental status. Additionally, there is a concern about foul-smelling urine. No fever, sweats, chills, stiff neck, chest pain, dyspnea. Allegedly her blood pressure was elevated this morning, but is normal now.      Past Medical History:  Diagnosis Date  . Dementia   . High cholesterol   . Hypertension   . Stroke [facility])     Patient Active Problem List   Diagnosis Date Noted  . CVA (cerebral vascular accident) (HCC) 12/19/2016  . Recurrent falls 12/19/2016  . Dementia 12/19/2016  . UTI (urinary tract infection) 12/15/2016  . Lactic acidosis 12/15/2016  . Weakness 12/15/2016  . Hypertension   . History of stroke   . High cholesterol     History reviewed. No pertinent surgical history.  OB History    No data available       Home Medications    Prior to Admission medications   Medication Sig Start Date End Date Taking? Authorizing Provider  atorvastatin (LIPITOR) 10 MG tablet Take 1 tablet by mouth daily. 12/11/16  Yes [provider]  busPIRone (BUSPAR) 7.5 MG tablet Take 7.5 mg by mouth 2 (two) times daily.   Yes [provider]  dipyridamole-aspirin (AGGRENOX) 200-25 MG 12hr capsule TK 1 C PO BID 12/14/15  Yes [provider]  amLODipine (NORVASC) 5 MG tablet Take 1 tablet (5 mg total) by mouth daily. Patient not taking: Reported on 08/17/2017 12/19/16   Regalado, Jon Billings A, MD  cephALEXin (KEFLEX) 500 MG capsule Take 1 capsule (500 mg total) by mouth 4 (four) times daily. 08/17/17   Donnetta Hutching, MD  Chlorpheniramine-Acetaminophen (CORICIDIN HBP COLD/FLU PO) Take 2 tablets by mouth daily as needed.    [provider]    cloNIDine (CATAPRES) 0.2 MG tablet Take 1 tablet by mouth 2 (two) times daily. 12/06/16   [provider]  donepezil (ARICEPT) 5 MG tablet Take 5 mg by mouth at bedtime.    [provider]  feeding supplement, ENSURE ENLIVE, (ENSURE ENLIVE) LIQD Take 237 mLs by mouth 2 (two) times daily between meals. Patient not taking: Reported on 08/17/2017 12/18/16   Hartley Barefoot A, MD  metoprolol tartrate (LOPRESSOR) 25 MG tablet Take 1 tablet (25 mg total) by mouth 2 (two) times daily. Patient not taking: Reported on 08/17/2017 12/18/16   Regalado, Jon Billings A, MD  QUEtiapine (SEROQUEL) 25 MG tablet Take 12.5 mg by mouth at bedtime.    [provider]  vitamin B-12 100 MCG tablet Take 1 tablet (100 mcg total) by mouth daily. Patient not taking: Reported on 08/17/2017 12/18/16   Alba Cory, MD    Family History No family history on file.  Social History Social History  Substance Use Topics  . Smoking status: Never Smoker  . Smokeless tobacco: Never Used  . Alcohol use No     Allergies   Patient has no known allergies.   Review of Systems Review of Systems  All other systems reviewed and are negative.    Physical Exam Updated Vital Signs BP (!) 114/102   Pulse 64   Temp 97.6 F (36.4 C) (Oral)   Resp 18  Ht 5\' 3"  (1.6 m)   Wt 54.4 kg (120 lb)   SpO2 100%   BMI 21.26 kg/m   Physical Exam  Constitutional:  NAD  HENT:  Head: Normocephalic.  Yellowish to 2 in the periorbital areas consistent with aging ecchymosis spots  Eyes: Conjunctivae are normal.  Neck: Neck supple.  Cardiovascular: Normal rate and regular rhythm.   Pulmonary/Chest: Effort normal and breath sounds normal.  Abdominal: Soft. Bowel sounds are normal.  Musculoskeletal: Normal range of motion.  Neurological: She is alert.  Skin: Skin is warm and dry.  Psychiatric:  Pleasant, demented  Nursing note and vitals reviewed.    ED Treatments / Results  Labs (all labs  ordered are listed, but only abnormal results are displayed) Labs Reviewed  COMPREHENSIVE METABOLIC PANEL - Abnormal; Notable for the following:       Result Value   Glucose, Bld 139 (*)    Calcium 8.7 (*)    Total Protein 6.2 (*)    ALT 13 (*)    GFR calc non Af Amer 59 (*)    All other components within normal limits  CBC - Abnormal; Notable for the following:    RBC 3.45 (*)    Hemoglobin 11.0 (*)    HCT 32.8 (*)    All other components within normal limits  URINALYSIS, ROUTINE W REFLEX MICROSCOPIC - Abnormal; Notable for the following:    APPearance TURBID (*)    Hgb urine dipstick SMALL (*)    Protein, ur >300 (*)    Leukocytes, UA LARGE (*)    All other components within normal limits  URINALYSIS, MICROSCOPIC (REFLEX) - Abnormal; Notable for the following:    Bacteria, UA MANY (*)    Squamous Epithelial / LPF TOO NUMEROUS TO COUNT (*)    All other components within normal limits  CBG MONITORING, ED - Abnormal; Notable for the following:    Glucose-Capillary 112 (*)    All other components within normal limits  URINE CULTURE    EKG  EKG Interpretation  Date/Time:  Saturday August 17 2017 10:00:41 EDT Ventricular Rate:  69 PR Interval:    QRS Duration: 120 QT Interval:  420 QTC Calculation: 450 R Axis:   69 Text Interpretation:  Sinus rhythm Probable left atrial enlargement IVCD, consider atypical RBBB Confirmed by Donnetta Hutching ([id number]) on 08/17/2017 10:39:55 AM       Radiology No results found.  Procedures Procedures (including critical care time)  Medications Ordered in ED Medications  cefTRIAXone (ROCEPHIN) 1 g in dextrose 5 % 50 mL IVPB (1 g Intravenous New Bag/Given 08/17/17 1332)  sodium chloride 0.9 % bolus 500 mL (500 mLs Intravenous New Bag/Given 08/17/17 1332)     Initial Impression / Assessment and Plan / ED Course  I have reviewed the triage vital signs and the nursing notes.  Pertinent labs & imaging results that were available during my care  of the patient were reviewed by me and considered in my medical decision making (see chart for details).     Workup reveals a urinary tract infection. IV fluids, IV Rocephin, urine culture. Discharge medication Keflex 500 mg. Discussed with the husband.  Final Clinical Impressions(s) / ED Diagnoses   Final diagnoses:  Urinary tract infection without hematuria, site unspecified    New Prescriptions New Prescriptions   CEPHALEXIN (KEFLEX) 500 MG CAPSULE    Take 1 capsule (500 mg total) by mouth 4 (four) times daily.     Donnetta Hutching, MD 08/17/17 1356

## 2017-08-17 NOTE — ED Notes (Addendum)
Pt Leaving with [facility] at this time. Dr. Adriana Simas okay with bp at this time for d/c. Suzette Battiest, RN at [facility] notified of bp. Pt husband states she did not have any medication prior to arrival to hospital this morning.

## 2017-08-17 NOTE — ED Notes (Signed)
Pt bed linens changed for incontinence, skin cleaned and dried.

## 2017-08-17 NOTE — Discharge Instructions (Signed)
You have a urinary tract infection. Increase fluids. Prescription for antibiotic.

## 2017-08-17 NOTE — ED Notes (Signed)
Called for transfer of PT back to [place].

## 2017-08-17 NOTE — ED Triage Notes (Signed)
Pt brought in by [facility] from [place] with c/o AMS that started this morning upon wakening. Pt has hx of dementia and recent stroke. Recently placed in facility due to her husband being unable to take care of her at home. EMS reports facility reports she is "more altered than normal" and her urine is foul smelling.

## 2017-08-17 NOTE — ED Notes (Signed)
Blood sugar 112

## 2017-08-17 NOTE — ED Notes (Signed)
Pt made aware to return if symptoms worsen or if any life threatening symptoms occur.   

## 2017-08-17 NOTE — ED Notes (Signed)
Dressed pt back in clothes from nursing home.  Also changed bed linens that were soaked with urine. Skin cleaned and dried.

## 2017-08-17 NOTE — ED Notes (Signed)
EKG given to Dr. Cook  

## 2017-08-18 DIAGNOSIS — R296 Repeated falls: Secondary | ICD-10-CM | POA: Diagnosis not present

## 2017-08-18 DIAGNOSIS — Z7982 Long term (current) use of aspirin: Secondary | ICD-10-CM | POA: Diagnosis not present

## 2017-08-18 DIAGNOSIS — I10 Essential (primary) hypertension: Secondary | ICD-10-CM | POA: Diagnosis not present

## 2017-08-18 DIAGNOSIS — F0390 Unspecified dementia without behavioral disturbance: Secondary | ICD-10-CM | POA: Diagnosis not present

## 2017-08-18 DIAGNOSIS — E782 Mixed hyperlipidemia: Secondary | ICD-10-CM | POA: Diagnosis not present

## 2017-08-18 DIAGNOSIS — E669 Obesity, unspecified: Secondary | ICD-10-CM | POA: Diagnosis not present

## 2017-08-18 DIAGNOSIS — R2689 Other abnormalities of gait and mobility: Secondary | ICD-10-CM | POA: Diagnosis not present

## 2017-08-19 DIAGNOSIS — N390 Urinary tract infection, site not specified: Secondary | ICD-10-CM | POA: Diagnosis not present

## 2017-08-19 DIAGNOSIS — E785 Hyperlipidemia, unspecified: Secondary | ICD-10-CM | POA: Diagnosis not present

## 2017-08-19 DIAGNOSIS — M6281 Muscle weakness (generalized): Secondary | ICD-10-CM | POA: Diagnosis not present

## 2017-08-19 DIAGNOSIS — R296 Repeated falls: Secondary | ICD-10-CM | POA: Diagnosis not present

## 2017-08-19 LAB — URINE CULTURE

## 2017-08-20 DIAGNOSIS — Z7982 Long term (current) use of aspirin: Secondary | ICD-10-CM | POA: Diagnosis not present

## 2017-08-20 DIAGNOSIS — R296 Repeated falls: Secondary | ICD-10-CM | POA: Diagnosis not present

## 2017-08-20 DIAGNOSIS — F0390 Unspecified dementia without behavioral disturbance: Secondary | ICD-10-CM | POA: Diagnosis not present

## 2017-08-20 DIAGNOSIS — Z9181 History of falling: Secondary | ICD-10-CM | POA: Diagnosis not present

## 2017-08-20 DIAGNOSIS — R2681 Unsteadiness on feet: Secondary | ICD-10-CM | POA: Diagnosis not present

## 2017-08-20 DIAGNOSIS — E669 Obesity, unspecified: Secondary | ICD-10-CM | POA: Diagnosis not present

## 2017-08-20 DIAGNOSIS — R2689 Other abnormalities of gait and mobility: Secondary | ICD-10-CM | POA: Diagnosis not present

## 2017-08-20 DIAGNOSIS — I10 Essential (primary) hypertension: Secondary | ICD-10-CM | POA: Diagnosis not present

## 2017-08-20 DIAGNOSIS — E782 Mixed hyperlipidemia: Secondary | ICD-10-CM | POA: Diagnosis not present

## 2017-08-21 DIAGNOSIS — E782 Mixed hyperlipidemia: Secondary | ICD-10-CM | POA: Diagnosis not present

## 2017-08-21 DIAGNOSIS — R296 Repeated falls: Secondary | ICD-10-CM | POA: Diagnosis not present

## 2017-08-21 DIAGNOSIS — F0390 Unspecified dementia without behavioral disturbance: Secondary | ICD-10-CM | POA: Diagnosis not present

## 2017-08-21 DIAGNOSIS — Z7982 Long term (current) use of aspirin: Secondary | ICD-10-CM | POA: Diagnosis not present

## 2017-08-21 DIAGNOSIS — R2689 Other abnormalities of gait and mobility: Secondary | ICD-10-CM | POA: Diagnosis not present

## 2017-08-21 DIAGNOSIS — E669 Obesity, unspecified: Secondary | ICD-10-CM | POA: Diagnosis not present

## 2017-08-21 DIAGNOSIS — I10 Essential (primary) hypertension: Secondary | ICD-10-CM | POA: Diagnosis not present

## 2017-08-22 DIAGNOSIS — E669 Obesity, unspecified: Secondary | ICD-10-CM | POA: Diagnosis not present

## 2017-08-22 DIAGNOSIS — F0390 Unspecified dementia without behavioral disturbance: Secondary | ICD-10-CM | POA: Diagnosis not present

## 2017-08-22 DIAGNOSIS — Z7982 Long term (current) use of aspirin: Secondary | ICD-10-CM | POA: Diagnosis not present

## 2017-08-22 DIAGNOSIS — E782 Mixed hyperlipidemia: Secondary | ICD-10-CM | POA: Diagnosis not present

## 2017-08-22 DIAGNOSIS — R296 Repeated falls: Secondary | ICD-10-CM | POA: Diagnosis not present

## 2017-08-22 DIAGNOSIS — I10 Essential (primary) hypertension: Secondary | ICD-10-CM | POA: Diagnosis not present

## 2017-08-22 DIAGNOSIS — R2689 Other abnormalities of gait and mobility: Secondary | ICD-10-CM | POA: Diagnosis not present

## 2017-08-23 DIAGNOSIS — I10 Essential (primary) hypertension: Secondary | ICD-10-CM | POA: Diagnosis not present

## 2017-08-23 DIAGNOSIS — Z7982 Long term (current) use of aspirin: Secondary | ICD-10-CM | POA: Diagnosis not present

## 2017-08-23 DIAGNOSIS — E782 Mixed hyperlipidemia: Secondary | ICD-10-CM | POA: Diagnosis not present

## 2017-08-23 DIAGNOSIS — E669 Obesity, unspecified: Secondary | ICD-10-CM | POA: Diagnosis not present

## 2017-08-23 DIAGNOSIS — F0390 Unspecified dementia without behavioral disturbance: Secondary | ICD-10-CM | POA: Diagnosis not present

## 2017-08-23 DIAGNOSIS — R296 Repeated falls: Secondary | ICD-10-CM | POA: Diagnosis not present

## 2017-08-23 DIAGNOSIS — R2689 Other abnormalities of gait and mobility: Secondary | ICD-10-CM | POA: Diagnosis not present

## 2017-08-27 DIAGNOSIS — R2689 Other abnormalities of gait and mobility: Secondary | ICD-10-CM | POA: Diagnosis not present

## 2017-08-27 DIAGNOSIS — F0390 Unspecified dementia without behavioral disturbance: Secondary | ICD-10-CM | POA: Diagnosis not present

## 2017-08-27 DIAGNOSIS — E782 Mixed hyperlipidemia: Secondary | ICD-10-CM | POA: Diagnosis not present

## 2017-08-27 DIAGNOSIS — R296 Repeated falls: Secondary | ICD-10-CM | POA: Diagnosis not present

## 2017-08-27 DIAGNOSIS — I10 Essential (primary) hypertension: Secondary | ICD-10-CM | POA: Diagnosis not present

## 2017-08-27 DIAGNOSIS — Z7982 Long term (current) use of aspirin: Secondary | ICD-10-CM | POA: Diagnosis not present

## 2017-08-27 DIAGNOSIS — E669 Obesity, unspecified: Secondary | ICD-10-CM | POA: Diagnosis not present

## 2017-08-28 DIAGNOSIS — F0390 Unspecified dementia without behavioral disturbance: Secondary | ICD-10-CM | POA: Diagnosis not present

## 2017-08-28 DIAGNOSIS — E782 Mixed hyperlipidemia: Secondary | ICD-10-CM | POA: Diagnosis not present

## 2017-08-28 DIAGNOSIS — R296 Repeated falls: Secondary | ICD-10-CM | POA: Diagnosis not present

## 2017-08-28 DIAGNOSIS — E669 Obesity, unspecified: Secondary | ICD-10-CM | POA: Diagnosis not present

## 2017-08-28 DIAGNOSIS — R2689 Other abnormalities of gait and mobility: Secondary | ICD-10-CM | POA: Diagnosis not present

## 2017-08-28 DIAGNOSIS — I10 Essential (primary) hypertension: Secondary | ICD-10-CM | POA: Diagnosis not present

## 2017-08-28 DIAGNOSIS — Z7982 Long term (current) use of aspirin: Secondary | ICD-10-CM | POA: Diagnosis not present

## 2017-08-29 DIAGNOSIS — R2689 Other abnormalities of gait and mobility: Secondary | ICD-10-CM | POA: Diagnosis not present

## 2017-08-29 DIAGNOSIS — R296 Repeated falls: Secondary | ICD-10-CM | POA: Diagnosis not present

## 2017-08-29 DIAGNOSIS — Z7982 Long term (current) use of aspirin: Secondary | ICD-10-CM | POA: Diagnosis not present

## 2017-08-29 DIAGNOSIS — I10 Essential (primary) hypertension: Secondary | ICD-10-CM | POA: Diagnosis not present

## 2017-08-29 DIAGNOSIS — F0390 Unspecified dementia without behavioral disturbance: Secondary | ICD-10-CM | POA: Diagnosis not present

## 2017-08-29 DIAGNOSIS — E782 Mixed hyperlipidemia: Secondary | ICD-10-CM | POA: Diagnosis not present

## 2017-08-29 DIAGNOSIS — E669 Obesity, unspecified: Secondary | ICD-10-CM | POA: Diagnosis not present

## 2017-08-30 DIAGNOSIS — Z7982 Long term (current) use of aspirin: Secondary | ICD-10-CM | POA: Diagnosis not present

## 2017-08-30 DIAGNOSIS — F0390 Unspecified dementia without behavioral disturbance: Secondary | ICD-10-CM | POA: Diagnosis not present

## 2017-08-30 DIAGNOSIS — E669 Obesity, unspecified: Secondary | ICD-10-CM | POA: Diagnosis not present

## 2017-08-30 DIAGNOSIS — E782 Mixed hyperlipidemia: Secondary | ICD-10-CM | POA: Diagnosis not present

## 2017-08-30 DIAGNOSIS — R296 Repeated falls: Secondary | ICD-10-CM | POA: Diagnosis not present

## 2017-08-30 DIAGNOSIS — R2689 Other abnormalities of gait and mobility: Secondary | ICD-10-CM | POA: Diagnosis not present

## 2017-08-30 DIAGNOSIS — I10 Essential (primary) hypertension: Secondary | ICD-10-CM | POA: Diagnosis not present

## 2017-08-31 ENCOUNTER — Emergency Department (HOSPITAL_COMMUNITY): Payer: BLUE CROSS/BLUE SHIELD

## 2017-08-31 ENCOUNTER — Emergency Department (HOSPITAL_COMMUNITY)
Admission: EM | Admit: 2017-08-31 | Discharge: 2017-08-31 | Disposition: A | Discharge status: Home / Self Care | Payer: BLUE CROSS/BLUE SHIELD | Attending: Emergency Medicine | Admitting: Emergency Medicine

## 2017-08-31 ENCOUNTER — Encounter (HOSPITAL_COMMUNITY): Payer: Self-pay | Admitting: *Deleted

## 2017-08-31 DIAGNOSIS — I10 Essential (primary) hypertension: Secondary | ICD-10-CM | POA: Insufficient documentation

## 2017-08-31 DIAGNOSIS — R4182 Altered mental status, unspecified: Secondary | ICD-10-CM | POA: Diagnosis not present

## 2017-08-31 DIAGNOSIS — Z8673 Personal history of transient ischemic attack (TIA), and cerebral infarction without residual deficits: Secondary | ICD-10-CM | POA: Insufficient documentation

## 2017-08-31 DIAGNOSIS — F0390 Unspecified dementia without behavioral disturbance: Secondary | ICD-10-CM | POA: Diagnosis not present

## 2017-08-31 DIAGNOSIS — R404 Transient alteration of awareness: Secondary | ICD-10-CM | POA: Diagnosis not present

## 2017-08-31 DIAGNOSIS — Z79899 Other long term (current) drug therapy: Secondary | ICD-10-CM | POA: Insufficient documentation

## 2017-08-31 DIAGNOSIS — R9431 Abnormal electrocardiogram [ECG] [EKG]: Secondary | ICD-10-CM | POA: Diagnosis not present

## 2017-08-31 DIAGNOSIS — R079 Chest pain, unspecified: Secondary | ICD-10-CM | POA: Diagnosis not present

## 2017-08-31 DIAGNOSIS — R402421 Glasgow coma scale score 9-12, in the field [EMT or ambulance]: Secondary | ICD-10-CM | POA: Diagnosis not present

## 2017-08-31 DIAGNOSIS — R410 Disorientation, unspecified: Secondary | ICD-10-CM

## 2017-08-31 LAB — URINALYSIS, ROUTINE W REFLEX MICROSCOPIC
BACTERIA UA: NONE SEEN
BILIRUBIN URINE: NEGATIVE
Glucose, UA: NEGATIVE mg/dL
Hgb urine dipstick: NEGATIVE
KETONES UR: NEGATIVE mg/dL
Leukocytes, UA: NEGATIVE
Nitrite: NEGATIVE
Protein, ur: 100 mg/dL — AB
RBC / HPF: NONE SEEN RBC/hpf (ref 0–5)
SPECIFIC GRAVITY, URINE: 1.021 (ref 1.005–1.030)
WBC, UA: NONE SEEN WBC/hpf (ref 0–5)
pH: 5 (ref 5.0–8.0)

## 2017-08-31 LAB — CBC WITH DIFFERENTIAL/PLATELET
BASOS PCT: 0 %
Basophils Absolute: 0 10*3/uL (ref 0.0–0.1)
Eosinophils Absolute: 0 10*3/uL (ref 0.0–0.7)
Eosinophils Relative: 1 %
HEMATOCRIT: 35.5 % — AB (ref 36.0–46.0)
HEMOGLOBIN: 11.8 g/dL — AB (ref 12.0–15.0)
LYMPHS ABS: 0.9 10*3/uL (ref 0.7–4.0)
LYMPHS PCT: 14 %
MCH: 32 pg (ref 26.0–34.0)
MCHC: 33.2 g/dL (ref 30.0–36.0)
MCV: 96.2 fL (ref 78.0–100.0)
MONO ABS: 0.4 10*3/uL (ref 0.1–1.0)
MONOS PCT: 6 %
NEUTROS ABS: 5.3 10*3/uL (ref 1.7–7.7)
Neutrophils Relative %: 79 %
Platelets: 187 10*3/uL (ref 150–400)
RBC: 3.69 MIL/uL — ABNORMAL LOW (ref 3.87–5.11)
RDW: 13.5 % (ref 11.5–15.5)
WBC: 6.6 10*3/uL (ref 4.0–10.5)

## 2017-08-31 LAB — TROPONIN I

## 2017-08-31 LAB — BASIC METABOLIC PANEL
Anion gap: 11 (ref 5–15)
BUN: 19 mg/dL (ref 6–20)
CHLORIDE: 108 mmol/L (ref 101–111)
CO2: 20 mmol/L — ABNORMAL LOW (ref 22–32)
CREATININE: 1.07 mg/dL — AB (ref 0.44–1.00)
Calcium: 9 mg/dL (ref 8.9–10.3)
GFR calc Af Amer: >60 mL/min (ref >60)
GFR calc non Af Amer: 54 mL/min — ABNORMAL LOW (ref >60)
GLUCOSE: 105 mg/dL — AB (ref 65–99)
Potassium: 5.1 mmol/L (ref 3.5–5.1)
SODIUM: 139 mmol/L (ref 135–145)

## 2017-08-31 LAB — CBG MONITORING, ED: GLUCOSE-CAPILLARY: 97 mg/dL (ref 65–99)

## 2017-08-31 MED ORDER — SODIUM CHLORIDE 0.9 % IV SOLN: INTRAVENOUS | Status: DC

## 2017-08-31 NOTE — ED Triage Notes (Signed)
Pt transported vis [facility] [place]. It was reported byh EMS Pt was found on floor  Unresponsive.   By [facility] pt has been living there for 3 weeks. Pt was at home with husband  And had multiple falls .  It was re ported  Pt is talkative but is confused . Pt needs prompting to do ADL's.  Pt does ambulate with out assistance.

## 2017-08-31 NOTE — ED Notes (Signed)
[facility] contacted to [facility]

## 2017-08-31 NOTE — ED Provider Notes (Signed)
[facility]-EMERG[facility] DEPT Provider Note   CSN: [id number] Arrival date & time: 08/31/17  1137     History   Chief Complaint Chief Complaint  Patient presents with  . Altered Mental Status    HPI Maria Ortega is a 61 y.o. female.  61 year old female with history of dementia presents from nursing home with altered mental status. Patient was found on the ground after unknown witnessed fall to be more lethargic. Her baseline mental status according to the record is that she can follow commands but cannot have a conversation. EMS was called and an EKG was performed and felt to be consistent with acute MI. Patient did not have any reports of chest pain. Was transported here. No further history obtainable due to her current condition      Past Medical History:  Diagnosis Date  . Dementia   . Dementia   . High cholesterol   . Hypertension   . Stroke [facility](HCC)     Patient Active Problem List   Diagnosis Date Noted  . CVA (cerebral vascular accident) (HCC) 12/19/2016  . Recurrent falls 12/19/2016  . Dementia 12/19/2016  . UTI (urinary tract infection) 12/15/2016  . Lactic acidosis 12/15/2016  . Weakness 12/15/2016  . Hypertension   . History of stroke   . High cholesterol     History reviewed. No pertinent surgical history.  OB History    No data available       Home Medications    Prior to Admission medications   Medication Sig Start Date End Date Taking? Authorizing Provider  amLODipine (NORVASC) 5 MG tablet Take 1 tablet (5 mg total) by mouth daily. Patient not taking: Reported on 08/17/2017 12/19/16   Regalado, Jon BillingsBelkys A, MD  atorvastatin (LIPITOR) 10 MG tablet Take 1 tablet by mouth daily. 12/11/16   [provider]  busPIRone (BUSPAR) 7.5 MG tablet Take 7.5 mg by mouth 2 (two) times daily.    [provider]  cephALEXin (KEFLEX) 500 MG capsule Take 1 capsule (500 mg total) by mouth 4 (four) times daily. 08/17/17   Donnetta Hutchingook, Brian, MD    Chlorpheniramine-Acetaminophen (CORICIDIN HBP COLD/FLU PO) Take 2 tablets by mouth daily as needed.    [provider]  cloNIDine (CATAPRES) 0.2 MG tablet Take 1 tablet by mouth 2 (two) times daily. 12/06/16   [provider]  dipyridamole-aspirin (AGGRENOX) 200-25 MG 12hr capsule TK 1 C PO BID 12/14/15   [provider]  donepezil (ARICEPT) 5 MG tablet Take 5 mg by mouth at bedtime.    [provider]  feeding supplement, ENSURE ENLIVE, (ENSURE ENLIVE) LIQD Take 237 mLs by mouth 2 (two) times daily between meals. Patient not taking: Reported on 08/17/2017 12/18/16   Hartley Barefootegalado, Belkys A, MD  metoprolol tartrate (LOPRESSOR) 25 MG tablet Take 1 tablet (25 mg total) by mouth 2 (two) times daily. Patient not taking: Reported on 08/17/2017 12/18/16   Regalado, Jon BillingsBelkys A, MD  QUEtiapine (SEROQUEL) 25 MG tablet Take 12.5 mg by mouth at bedtime.    [provider]  vitamin B-12 100 MCG tablet Take 1 tablet (100 mcg total) by mouth daily. Patient not taking: Reported on 08/17/2017 12/18/16   Alba Coryegalado, Belkys A, MD    Family History History reviewed. No pertinent family history.  Social History Social History  Substance Use Topics  . Smoking status: Never Smoker  . Smokeless tobacco: Never Used  . Alcohol use No     Allergies   Patient has no known allergies.  Review of Systems Review of Systems  Unable to perform ROS: Dementia     Physical Exam Updated Vital Signs There were no vitals taken for this visit.  Physical Exam  Constitutional: She appears lethargic. She appears cachectic. She is uncooperative.  Non-toxic appearance. No distress.  HENT:  Head: Normocephalic and atraumatic.    Eyes: Pupils are equal, round, and reactive to light. Conjunctivae, EOM and lids are normal.  Neck: Normal range of motion. Neck supple. No tracheal deviation present. No thyroid mass present.  Cardiovascular: Normal rate, regular rhythm and normal heart  sounds.  Exam reveals no gallop.   No murmur heard. Pulmonary/Chest: Effort normal and breath sounds normal. No stridor. No respiratory distress. She has no decreased breath sounds. She has no wheezes. She has no rhonchi. She has no rales.  Abdominal: Soft. Normal appearance and bowel sounds are normal. She exhibits no distension. There is no tenderness. There is no rebound and no CVA tenderness.  Musculoskeletal: Normal range of motion. She exhibits no edema or tenderness.  Neurological: She appears lethargic. She displays atrophy. No cranial nerve deficit or sensory deficit. GCS eye subscore is 4. GCS verbal subscore is 4. GCS motor subscore is 5.  Withdraws to pain in all 4 extremities  Skin: Skin is warm and dry. No abrasion and no rash noted.  Psychiatric: She is inattentive.  Nursing note and vitals reviewed.    ED Treatments / Results  Labs (all labs ordered are listed, but only abnormal results are displayed) Labs Reviewed  URINE CULTURE  CBC WITH DIFFERENTIAL/PLATELET  BASIC METABOLIC PANEL  TROPONIN I  URINALYSIS, ROUTINE W REFLEX MICROSCOPIC    EKG  EKG Interpretation  Date/Time:  Saturday August 31 2017 11:49:48 EDT Ventricular Rate:  57 PR Interval:    QRS Duration: 119 QT Interval:  441 QTC Calculation: 430 R Axis:   78 Text Interpretation:  Sinus rhythm Incomplete right bundle branch block Minimal ST elevation, inferior leads No significant change since last tracing Confirmed by Lorre Nick ([id number]) on 08/31/2017 11:57:14 AM       Radiology No results found.  Procedures Procedures (including critical care time)  Medications Ordered in ED Medications  0.9 %  sodium chloride infusion (not administered)     Initial Impression / Assessment and Plan / ED Course  I have reviewed the triage vital signs and the nursing notes.  Pertinent labs & imaging results that were available during my care of the patient were reviewed by me and considered in my  medical decision making (see chart for details).     Spoke with family at length about her current condition. States that she's had several episodes of these current symptoms are likely related to dementia. Urinalysis and head CT are without acute findings. Stable for discharge return precautions given  Final Clinical Impressions(s) / ED Diagnoses   Final diagnoses:  None    New Prescriptions New Prescriptions   No medications on file     Lorre Nick, MD 08/31/17 1536

## 2017-09-01 LAB — URINE CULTURE: CULTURE: NO GROWTH

## 2017-09-04 DIAGNOSIS — M79675 Pain in left toe(s): Secondary | ICD-10-CM | POA: Diagnosis not present

## 2017-09-04 DIAGNOSIS — E119 Type 2 diabetes mellitus without complications: Secondary | ICD-10-CM | POA: Diagnosis not present

## 2017-09-04 DIAGNOSIS — I10 Essential (primary) hypertension: Secondary | ICD-10-CM | POA: Diagnosis not present

## 2017-09-04 DIAGNOSIS — M79674 Pain in right toe(s): Secondary | ICD-10-CM | POA: Diagnosis not present

## 2017-09-04 DIAGNOSIS — F0390 Unspecified dementia without behavioral disturbance: Secondary | ICD-10-CM | POA: Diagnosis not present

## 2017-09-04 DIAGNOSIS — E785 Hyperlipidemia, unspecified: Secondary | ICD-10-CM | POA: Diagnosis not present

## 2017-09-04 DIAGNOSIS — M6281 Muscle weakness (generalized): Secondary | ICD-10-CM | POA: Diagnosis not present

## 2017-09-04 DIAGNOSIS — B351 Tinea unguium: Secondary | ICD-10-CM | POA: Diagnosis not present

## 2017-09-09 DIAGNOSIS — N390 Urinary tract infection, site not specified: Secondary | ICD-10-CM | POA: Diagnosis not present

## 2017-09-09 DIAGNOSIS — F0390 Unspecified dementia without behavioral disturbance: Secondary | ICD-10-CM | POA: Diagnosis not present

## 2017-09-09 DIAGNOSIS — R296 Repeated falls: Secondary | ICD-10-CM | POA: Diagnosis not present

## 2017-09-12 ENCOUNTER — Encounter (HOSPITAL_COMMUNITY): Payer: Self-pay | Admitting: Emergency Medicine

## 2017-09-12 ENCOUNTER — Emergency Department (HOSPITAL_COMMUNITY): Payer: BLUE CROSS/BLUE SHIELD

## 2017-09-12 ENCOUNTER — Emergency Department (HOSPITAL_COMMUNITY)
Admission: EM | Admit: 2017-09-12 | Discharge: 2017-09-12 | Disposition: A | Discharge status: Home / Self Care | Payer: BLUE CROSS/BLUE SHIELD | Attending: Emergency Medicine | Admitting: Emergency Medicine

## 2017-09-12 DIAGNOSIS — Z8673 Personal history of transient ischemic attack (TIA), and cerebral infarction without residual deficits: Secondary | ICD-10-CM | POA: Insufficient documentation

## 2017-09-12 DIAGNOSIS — Z79899 Other long term (current) drug therapy: Secondary | ICD-10-CM | POA: Insufficient documentation

## 2017-09-12 DIAGNOSIS — Y999 Unspecified external cause status: Secondary | ICD-10-CM | POA: Insufficient documentation

## 2017-09-12 DIAGNOSIS — F0390 Unspecified dementia without behavioral disturbance: Secondary | ICD-10-CM | POA: Diagnosis not present

## 2017-09-12 DIAGNOSIS — Y92129 Unspecified place in nursing home as the place of occurrence of the external cause: Secondary | ICD-10-CM | POA: Insufficient documentation

## 2017-09-12 DIAGNOSIS — W19XXXA Unspecified fall, initial encounter: Secondary | ICD-10-CM

## 2017-09-12 DIAGNOSIS — F4489 Other dissociative and conversion disorders: Secondary | ICD-10-CM | POA: Diagnosis not present

## 2017-09-12 DIAGNOSIS — I6789 Other cerebrovascular disease: Secondary | ICD-10-CM | POA: Diagnosis not present

## 2017-09-12 DIAGNOSIS — W1830XA Fall on same level, unspecified, initial encounter: Secondary | ICD-10-CM | POA: Insufficient documentation

## 2017-09-12 DIAGNOSIS — S0993XA Unspecified injury of face, initial encounter: Secondary | ICD-10-CM | POA: Diagnosis not present

## 2017-09-12 DIAGNOSIS — S0083XA Contusion of other part of head, initial encounter: Secondary | ICD-10-CM | POA: Diagnosis not present

## 2017-09-12 DIAGNOSIS — S0990XA Unspecified injury of head, initial encounter: Secondary | ICD-10-CM | POA: Diagnosis not present

## 2017-09-12 DIAGNOSIS — I10 Essential (primary) hypertension: Secondary | ICD-10-CM | POA: Diagnosis not present

## 2017-09-12 DIAGNOSIS — Y939 Activity, unspecified: Secondary | ICD-10-CM | POA: Insufficient documentation

## 2017-09-12 NOTE — ED Triage Notes (Signed)
[facility] due [facility]. EMS states found patient on the floor crawling down the hallway. Patient denies pain. NAD

## 2017-09-12 NOTE — ED Notes (Signed)
Tiffany RN called [facility] to [facility]

## 2017-09-12 NOTE — Discharge Instructions (Signed)
CT scan of head and face without any acute injuries. Patient stable for discharge back to nursing facility.

## 2017-09-12 NOTE — ED Provider Notes (Signed)
[facility]-EMER[facility]ENCY DEPT Provider Note   CSN: [id number] Arrival date & time: 09/12/17  1629     History   Chief Complaint Chief Complaint  Patient presents with  . Fall    HPI Maria Ortega is a 61 y.o. female.  Patient brought in from [facility] facility. Patient has a history of dementia and prior strokes. Also history of frequent falls. Patient was evaluated September 1 after a possible fall. It was not witnessed. No witnessed fall today. Patient was found on the floor crawling down the hallway. According to patient's husband this is a typical event. She does have bruising around her right eye that appears several days old. This was not noted during the evaluation on September 1. Patient appears to be in no distress moving all extremities.      Past Medical History:  Diagnosis Date  . Dementia   . Dementia   . High cholesterol   . Hypertension   . Stroke [facility])     Patient Active Problem List   Diagnosis Date Noted  . CVA (cerebral vascular accident) (HCC) 12/19/2016  . Recurrent falls 12/19/2016  . Dementia 12/19/2016  . UTI (urinary tract infection) 12/15/2016  . Lactic acidosis 12/15/2016  . Weakness 12/15/2016  . Hypertension   . History of stroke   . High cholesterol     History reviewed. No pertinent surgical history.  OB History    No data available       Home Medications    Prior to Admission medications   Medication Sig Start Date End Date Taking? Authorizing Provider  amLODipine (NORVASC) 5 MG tablet Take 1 tablet (5 mg total) by mouth daily. Patient not taking: Reported on 08/17/2017 12/19/16   Regalado, Jon Billings A, MD  atorvastatin (LIPITOR) 10 MG tablet Take 1 tablet by mouth daily. 12/11/16   [provider]  busPIRone (BUSPAR) 7.5 MG tablet Take 7.5 mg by mouth 2 (two) times daily.    [provider]  cephALEXin (KEFLEX) 500 MG capsule Take 1 capsule (500 mg total) by mouth 4 (four) times daily. 08/17/17   Donnetta Hutching, MD    Chlorpheniramine-Acetaminophen (CORICIDIN HBP COLD/FLU PO) Take 2 tablets by mouth daily as needed.    [provider]  cloNIDine (CATAPRES) 0.2 MG tablet Take 1 tablet by mouth 2 (two) times daily. 12/06/16   [provider]  dipyridamole-aspirin (AGGRENOX) 200-25 MG 12hr capsule TK 1 C PO BID 12/14/15   [provider]  donepezil (ARICEPT) 5 MG tablet Take 5 mg by mouth at bedtime.    [provider]  feeding supplement, ENSURE ENLIVE, (ENSURE ENLIVE) LIQD Take 237 mLs by mouth 2 (two) times daily between meals. Patient not taking: Reported on 08/17/2017 12/18/16   Hartley Barefoot A, MD  metoprolol tartrate (LOPRESSOR) 25 MG tablet Take 1 tablet (25 mg total) by mouth 2 (two) times daily. Patient not taking: Reported on 08/17/2017 12/18/16   Regalado, Jon Billings A, MD  QUEtiapine (SEROQUEL) 25 MG tablet Take 12.5 mg by mouth at bedtime.    [provider]  vitamin B-12 100 MCG tablet Take 1 tablet (100 mcg total) by mouth daily. Patient not taking: Reported on 08/17/2017 12/18/16   Alba Cory, MD    Family History No family history on file.  Social History Social History  Substance Use Topics  . Smoking status: Never Smoker  . Smokeless tobacco: Never Used  . Alcohol use No     Allergies   Patient has no  known allergies.   Review of Systems Review of Systems  Unable to perform ROS: Dementia     Physical Exam Updated Vital Signs BP (!) 182/88 (BP Location: Right Arm) Comment: Manual  Pulse 94   Temp 98.7 F (37.1 C) (Oral)   Resp 19   Ht 1.6 m ( )   Wt 54.4 kg (120 lb)   SpO2 98%   BMI 21.26 kg/m   Physical Exam  Constitutional: She appears well-developed and well-nourished.  HENT:  Patient with bruising around the right eye dark on the right upper eyelid and more yellow below the eye. Suggestive of a injury from several days ago.  Eyes: Pupils are equal, round, and reactive to light. Conjunctivae and EOM are  normal.  Right eye with no hyphema  Neck: Neck supple.  Cardiovascular: Normal rate, regular rhythm and normal heart sounds.   Pulmonary/Chest: Effort normal and breath sounds normal. No respiratory distress.  Abdominal: Soft. Bowel sounds are normal. There is no tenderness.  Musculoskeletal: Normal range of motion. She exhibits no tenderness or deformity.  Neurological: She is alert.  Skin: No rash noted.  Nursing note and vitals reviewed.    ED Treatments / Results  Labs (all labs ordered are listed, but only abnormal results are displayed) Labs Reviewed - No data to display  EKG  EKG Interpretation None       Radiology Ct Head Wo Contrast  Result Date: 09/12/2017 CLINICAL DATA:  Patient found on floor with right periorbital hematoma. Dementia. EXAM: CT HEAD WITHOUT CONTRAST CT MAXILLOFACIAL WITHOUT CONTRAST TECHNIQUE: Multidetector CT imaging of the head and maxillofacial structures were performed using the standard protocol without intravenous contrast. Multiplanar CT image reconstructions of the maxillofacial structures were also generated. COMPARISON:  08/31/2017 FINDINGS: The studies are limited by patient motion despite several attempts at both the head and maxillofacial CT exams. CT HEAD FINDINGS BRAIN: There is stable moderate sulcal and ventricular prominence consistent with superficial and central atrophy. No intraparenchymal hemorrhage, mass effect nor midline shift. Moderate to marked degree of confluent periventricular and subcortical white matter hypodensity consistent with diffuse chronic small vessel ischemia. Physiologic calcifications in the right basal ganglia. No acute large vascular territory infarcts. No abnormal extra-axial fluid collections. Basal cisterns are not effaced and midline. VASCULAR: Moderate to marked calcific atherosclerosis of the carotid siphons. SKULL: No skull fracture. No significant scalp soft tissue swelling. SINUSES/ORBITS: The mastoid  air-cells are clear. The included paranasal sinuses are well-aerated.The included ocular globes and orbital contents are non-suspicious. OTHER: None. 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 No acute maxillofacial fracture allowing for limitations of patient motion. Orbits: Intact Sinuses: Clear. Soft tissues: Negative. IMPRESSION: Limited by patient motion artifacts. 1. Chronic stable cerebral atrophy with moderate to marked small vessel ischemic disease of periventricular and subcortical white matter. No acute intracranial abnormality. 2. No acute maxillofacial fracture identified. Electronically Signed   By: Tollie Eth M.D.   On: 09/12/2017 18:57   Ct Maxillofacial Wo Cm  Result Date: 09/12/2017 CLINICAL DATA:  Patient found on floor with right periorbital hematoma. Dementia. EXAM: CT HEAD WITHOUT CONTRAST CT MAXILLOFACIAL WITHOUT CONTRAST TECHNIQUE: Multidetector CT imaging of the head and maxillofacial structures were performed using the standard protocol without intravenous contrast. Multiplanar CT image reconstructions of the maxillofacial structures were also generated. COMPARISON:  08/31/2017 FINDINGS: The studies are limited by patient motion despite several attempts at both the head and maxillofacial CT exams. CT HEAD FINDINGS BRAIN: There  is stable moderate sulcal and ventricular prominence consistent with superficial and central atrophy. No intraparenchymal hemorrhage, mass effect nor midline shift. Moderate to marked degree of confluent periventricular and subcortical white matter hypodensity consistent with diffuse chronic small vessel ischemia. Physiologic calcifications in the right basal ganglia. No acute large vascular territory infarcts. No abnormal extra-axial fluid collections. Basal cisterns are not effaced and midline. VASCULAR: Moderate to marked calcific atherosclerosis of the carotid siphons. SKULL: No skull fracture. No  significant scalp soft tissue swelling. SINUSES/ORBITS: The mastoid air-cells are clear. The included paranasal sinuses are well-aerated.The included ocular globes and orbital contents are non-suspicious. OTHER: None. 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 No acute maxillofacial fracture allowing for limitations of patient motion. Orbits: Intact Sinuses: Clear. Soft tissues: Negative. IMPRESSION: Limited by patient motion artifacts. 1. Chronic stable cerebral atrophy with moderate to marked small vessel ischemic disease of periventricular and subcortical white matter. No acute intracranial abnormality. 2. No acute maxillofacial fracture identified. Electronically Signed   By: Tollie Ethavid  Kwon M.D.   On: 09/12/2017 18:57    Procedures Procedures (including critical care time)  Medications Ordered in ED Medications - No data to display   Initial Impression / Assessment and Plan / ED Course  I have reviewed the triage vital signs and the nursing notes.  Pertinent labs & imaging results that were available during my care of the patient were reviewed by me and considered in my medical decision making (see chart for details).    Patient had repeat CT head and facial due to the new bruising around the right eye and the possible new fall from today. Head CT and facial CT without any acute bony abnormalities or brain abnormalities. Patient stable for discharge back to nursing facility. Patient's husband agrees with plan.   Final Clinical Impressions(s) / ED Diagnoses   Final diagnoses:  Fall, initial encounter  Contusion of face, initial encounter    New Prescriptions New Prescriptions   No medications on file     Vanetta MuldersZackowski, Katerine Morua, MD 09/12/17 [phone]

## 2017-09-16 DIAGNOSIS — R296 Repeated falls: Secondary | ICD-10-CM | POA: Diagnosis not present

## 2017-09-16 DIAGNOSIS — M6281 Muscle weakness (generalized): Secondary | ICD-10-CM | POA: Diagnosis not present

## 2017-09-16 DIAGNOSIS — K5900 Constipation, unspecified: Secondary | ICD-10-CM | POA: Diagnosis not present

## 2017-09-16 DIAGNOSIS — N390 Urinary tract infection, site not specified: Secondary | ICD-10-CM | POA: Diagnosis not present

## 2017-09-18 ENCOUNTER — Emergency Department (HOSPITAL_COMMUNITY)
Admission: EM | Admit: 2017-09-18 | Discharge: 2017-09-19 | Disposition: A | Discharge status: Home / Self Care | Payer: BLUE CROSS/BLUE SHIELD | Attending: Emergency Medicine | Admitting: Emergency Medicine

## 2017-09-18 ENCOUNTER — Emergency Department (HOSPITAL_COMMUNITY): Payer: BLUE CROSS/BLUE SHIELD

## 2017-09-18 ENCOUNTER — Encounter (HOSPITAL_COMMUNITY): Payer: Self-pay

## 2017-09-18 DIAGNOSIS — F0390 Unspecified dementia without behavioral disturbance: Secondary | ICD-10-CM | POA: Diagnosis not present

## 2017-09-18 DIAGNOSIS — N390 Urinary tract infection, site not specified: Secondary | ICD-10-CM | POA: Diagnosis not present

## 2017-09-18 DIAGNOSIS — Z8673 Personal history of transient ischemic attack (TIA), and cerebral infarction without residual deficits: Secondary | ICD-10-CM | POA: Insufficient documentation

## 2017-09-18 DIAGNOSIS — S199XXA Unspecified injury of neck, initial encounter: Secondary | ICD-10-CM | POA: Diagnosis not present

## 2017-09-18 DIAGNOSIS — S022XXA Fracture of nasal bones, initial encounter for closed fracture: Secondary | ICD-10-CM | POA: Diagnosis not present

## 2017-09-18 DIAGNOSIS — Y929 Unspecified place or not applicable: Secondary | ICD-10-CM | POA: Insufficient documentation

## 2017-09-18 DIAGNOSIS — S0012XA Contusion of left eyelid and periocular area, initial encounter: Secondary | ICD-10-CM | POA: Diagnosis not present

## 2017-09-18 DIAGNOSIS — I10 Essential (primary) hypertension: Secondary | ICD-10-CM | POA: Diagnosis not present

## 2017-09-18 DIAGNOSIS — W19XXXA Unspecified fall, initial encounter: Secondary | ICD-10-CM | POA: Diagnosis not present

## 2017-09-18 DIAGNOSIS — S0011XA Contusion of right eyelid and periocular area, initial encounter: Secondary | ICD-10-CM | POA: Diagnosis not present

## 2017-09-18 DIAGNOSIS — S0121XA Laceration without foreign body of nose, initial encounter: Secondary | ICD-10-CM | POA: Diagnosis not present

## 2017-09-18 DIAGNOSIS — R402411 Glasgow coma scale score 13-15, in the field [EMT or ambulance]: Secondary | ICD-10-CM | POA: Diagnosis not present

## 2017-09-18 DIAGNOSIS — R58 Hemorrhage, not elsewhere classified: Secondary | ICD-10-CM | POA: Diagnosis present

## 2017-09-18 DIAGNOSIS — Y999 Unspecified external cause status: Secondary | ICD-10-CM | POA: Insufficient documentation

## 2017-09-18 DIAGNOSIS — S3993XA Unspecified injury of pelvis, initial encounter: Secondary | ICD-10-CM | POA: Diagnosis not present

## 2017-09-18 DIAGNOSIS — Y9389 Activity, other specified: Secondary | ICD-10-CM | POA: Insufficient documentation

## 2017-09-18 DIAGNOSIS — S299XXA Unspecified injury of thorax, initial encounter: Secondary | ICD-10-CM | POA: Diagnosis not present

## 2017-09-18 DIAGNOSIS — J3489 Other specified disorders of nose and nasal sinuses: Secondary | ICD-10-CM | POA: Diagnosis not present

## 2017-09-18 HISTORY — DX: Unspecified protein-calorie malnutrition: E46

## 2017-09-18 LAB — URINALYSIS, ROUTINE W REFLEX MICROSCOPIC
Bilirubin Urine: NEGATIVE
GLUCOSE, UA: NEGATIVE mg/dL
HGB URINE DIPSTICK: NEGATIVE
KETONES UR: NEGATIVE mg/dL
Nitrite: POSITIVE — AB
PROTEIN: 30 mg/dL — AB
Specific Gravity, Urine: 1.018 (ref 1.005–1.030)
pH: 7 (ref 5.0–8.0)

## 2017-09-18 LAB — I-STAT CHEM 8, ED
BUN: 31 mg/dL — ABNORMAL HIGH (ref 6–20)
CALCIUM ION: 1.17 mmol/L (ref 1.15–1.40)
Chloride: 107 mmol/L (ref 101–111)
Creatinine, Ser: 1.5 mg/dL — ABNORMAL HIGH (ref 0.44–1.00)
GLUCOSE: 110 mg/dL — AB (ref 65–99)
HCT: 28 % — ABNORMAL LOW (ref 36.0–46.0)
HEMOGLOBIN: 9.5 g/dL — AB (ref 12.0–15.0)
Potassium: 3.6 mmol/L (ref 3.5–5.1)
Sodium: 144 mmol/L (ref 135–145)
TCO2: 26 mmol/L (ref 22–32)

## 2017-09-18 MED ORDER — DEXTROSE 5 % IV SOLN
1.0000 g | Freq: Once | INTRAVENOUS | Status: AC
  Administered 2017-09-18: 1 g via INTRAVENOUS
  Filled 2017-09-18: qty 10

## 2017-09-18 MED ORDER — SODIUM CHLORIDE 0.9 % IV BOLUS (SEPSIS)
1500.0000 mL | Freq: Once | INTRAVENOUS | Status: AC
  Administered 2017-09-18: 1500 mL via INTRAVENOUS

## 2017-09-18 NOTE — ED Triage Notes (Addendum)
Ems reports pt is a resident at [facility] on dementia hall and staff found pt laying in the floor.  Reports has history of multiple falls.  Pt has bruising to both eyes, yellow in color, small laceration on bridge of nose and dried blood in nostrils.  Also bruise noted on abd, yellow in color.  Will assess further when pt gets in gown.

## 2017-09-18 NOTE — ED Notes (Signed)
Pt and family updated on plan of care,  

## 2017-09-18 NOTE — ED Provider Notes (Signed)
Emergency Department Provider Note   I have reviewed the triage vital signs and the nursing notes.   HISTORY  Chief Complaint Fall   HPI Maria Ortega is a 61 y.o. female within a witnessed fall but obvious ecchymosis in various stages of healing around her eyes and on her face.  LEVEL V CAVEAT secondary to dementia  Past Medical History:  Diagnosis Date  . Dementia   . Dementia   . High cholesterol   . Hypertension   . Protein calorie malnutrition (HCC)   . Stroke [facility])     Patient Active Problem List   Diagnosis Date Noted  . CVA (cerebral vascular accident) (HCC) 12/19/2016  . Recurrent falls 12/19/2016  . Dementia 12/19/2016  . UTI (urinary tract infection) 12/15/2016  . Lactic acidosis 12/15/2016  . Weakness 12/15/2016  . Hypertension   . History of stroke   . High cholesterol     History reviewed. No pertinent surgical history.  Current Outpatient Rx  . Order #: [id number] Class: Historical Med  . Order #: [id number] Class: Historical Med  . Order #: [id number] Class: Historical Med  . Order #: [id number] Class: Historical Med  . Order #: [id number] Class: Historical Med  . Order #: [id number] Class: Print    Allergies Patient has no known allergies.  No family history on file.  Social History Social History  Substance Use Topics  . Smoking status: Never Smoker  . Smokeless tobacco: Never Used  . Alcohol use No    Review of Systems  LEVEL V CAVEAT secondary to dementia ____________________________________________   PHYSICAL EXAM:  VITAL SIGNS: ED Triage Vitals  Enc Vitals Group     BP 09/18/17 1737 (!) 111/59     Pulse Rate 09/18/17 1736 65     Resp 09/18/17 1736 18     Temp 09/18/17 1736 98.2 F (36.8 C)     Temp Source 09/18/17 1736 Oral     SpO2 09/18/17 1736 98 %     Weight --      Height --      Head Circumference --      Peak Flow --      Pain Score 09/18/17 1734 0     Pain Loc --      Pain Edu? --      Excl. in GC? --      Constitutional: Alert. Well appearing and in no acute distress. Eyes: Conjunctivae are normal. PERRL. EOMI. Head: Atraumatic. Nose: No congestion/rhinnorhea. Ecchymosis around nose and eyes. Mouth/Throat: Mucous membranes are moist.  Oropharynx non-erythematous. Neck: No stridor.  No meningeal signs.   Cardiovascular: Normal rate, regular rhythm. Good peripheral circulation. Grossly normal heart sounds.   Respiratory: Normal respiratory effort.  No retractions. Lungs CTAB. Gastrointestinal: Soft and nontender. No distention.  Musculoskeletal: No lower extremity tenderness nor edema. No gross deformities of extremities.tenderness to left side pelvis with ecchymotic area. Neurologic:  Normal speech and language. No gross focal neurologic deficits are appreciated.  Skin:  Skin is warm, dry and intact. No rash noted. Muscle areas of ecchymosis over the extremities and pelvis.  ____________________________________________   LABS (all labs ordered are listed, but only abnormal results are displayed)  Labs Reviewed  URINALYSIS, ROUTINE W REFLEX MICROSCOPIC - Abnormal; Notable for the following:       Result Value   APPearance CLOUDY (*)    Protein, ur 30 (*)    Nitrite POSITIVE (*)    Leukocytes, UA LARGE (*)    Bacteria, UA MANY (*)  Squamous Epithelial / LPF 6-30 (*)    Non Squamous Epithelial 0-5 (*)    All other components within normal limits  I-STAT CHEM 8, ED - Abnormal; Notable for the following:    BUN 31 (*)    Creatinine, Ser 1.50 (*)    Glucose, Bld 110 (*)    Hemoglobin 9.5 (*)    HCT 28.0 (*)    All other components within normal limits   ____________________________________________  EKG   EKG Interpretation  Date/Time:    Ventricular Rate:    PR Interval:    QRS Duration:   QT Interval:    QTC Calculation:   R Axis:     Text Interpretation:         ____________________________________________  RADIOLOGY  Dg Chest 2 View  Result Date:  09/18/2017 CLINICAL DATA:  Unwitnessed fall today.  Evaluate for fracture. EXAM: CHEST  2 VIEW COMPARISON:  08/31/2017 FINDINGS: Lungs are clear. Negative for a pneumothorax. Heart and mediastinum are within normal limits. Atherosclerotic calcifications at the aortic arch. Bony thorax appears to be intact. No pleural effusions. IMPRESSION: No active cardiopulmonary disease. Electronically Signed   By: Richarda Overlie M.D.   On: 09/18/2017 19:59   Dg Pelvis 1-2 Views  Result Date: 09/18/2017 CLINICAL DATA:  Pain following fall EXAM: PELVIS - 1-2 VIEW COMPARISON:  None. FINDINGS: There is no evidence of pelvic fracture or dislocation. There is slight symmetric narrowing of both hip joints. No erosive change. IMPRESSION: Slight symmetric narrowing of both hip joints. No fracture or dislocation. Electronically Signed   By: Bretta Bang III M.D.   On: 09/18/2017 19:59   Ct Head Wo Contrast  Result Date: 09/18/2017 CLINICAL DATA:  History of multiple falls, bruising to both eye with laceration on bridge of nose EXAM: CT HEAD WITHOUT CONTRAST CT MAXILLOFACIAL WITHOUT CONTRAST CT CERVICAL SPINE WITHOUT CONTRAST TECHNIQUE: Multidetector CT imaging of the head, cervical spine, and maxillofacial structures were performed using the standard protocol without intravenous contrast. Multiplanar CT image reconstructions of the cervical spine and maxillofacial structures were also generated. COMPARISON:  09/12/2017, 08/31/2017, MRI 10/23/2006 FINDINGS: CT HEAD FINDINGS Brain: No acute territorial infarction, hemorrhage, or intracranial mass. Extensive, confluent hypodensity within the bilateral white matter. Old appearing lacunar infarcts within the basal ganglia and thalamus. Moderate atrophy. Stable slightly enlarged ventricles likely due to atrophy Vascular: No hyperdense vessels. Vertebral artery and carotid artery calcification Skull: No fracture or suspicious bone lesion Other: Mild mucosal thickening in the ethmoid  sinuses. No acute orbital abnormality. CT MAXILLOFACIAL FINDINGS Osseous: Bilateral mandibular heads are normally positioned. No mandibular fracture. Zygomatic arches and pterygoid plates are intact. Acute appearing, minimally depressed right nasal bone fracture since comparison study. Orbits: Negative. No traumatic or inflammatory finding. Sinuses: Old appearing fracture deformity of the right lateral, inferior wall of the maxillary sinus. No acute fluid levels. Minimal mucosal thickening in the ethmoid sinuses. No sinus wall fracture Soft tissues: Mild soft tissue swelling over the nasal area and forehead. CT CERVICAL SPINE FINDINGS Alignment: Straightening of the cervical spine. No subluxation. Facet alignment within normal limits Skull base and vertebrae: No acute fracture. No primary bone lesion or focal pathologic process. Soft tissues and spinal canal: No prevertebral fluid or swelling. No visible canal hematoma. Disc levels:  Mild degenerative changes at C6-C7. Upper chest: Negative. Other: Carotid artery calcification IMPRESSION: 1. No CT evidence for acute intracranial abnormality. 2. Straightening of the cervical spine. No acute fracture or malalignment 3. Acute appearing slightly depressed  right nasal bone fracture with overlying soft tissue swelling. Old appearing right lateral maxillary sinus wall fracture. No other acute facial bone fractures are seen 4. Extensive confluent hypodensity within the bilateral white matter presumably due to small vessel change, this is noted on previous exams. Electronically Signed   By: Jasmine Pang M.D.   On: 09/18/2017 20:18   Ct Cervical Spine Wo Contrast  Result Date: 09/18/2017 CLINICAL DATA:  History of multiple falls, bruising to both eye with laceration on bridge of nose EXAM: CT HEAD WITHOUT CONTRAST CT MAXILLOFACIAL WITHOUT CONTRAST CT CERVICAL SPINE WITHOUT CONTRAST TECHNIQUE: Multidetector CT imaging of the head, cervical spine, and maxillofacial  structures were performed using the standard protocol without intravenous contrast. Multiplanar CT image reconstructions of the cervical spine and maxillofacial structures were also generated. COMPARISON:  09/12/2017, 08/31/2017, MRI 10/23/2006 FINDINGS: CT HEAD FINDINGS Brain: No acute territorial infarction, hemorrhage, or intracranial mass. Extensive, confluent hypodensity within the bilateral white matter. Old appearing lacunar infarcts within the basal ganglia and thalamus. Moderate atrophy. Stable slightly enlarged ventricles likely due to atrophy Vascular: No hyperdense vessels. Vertebral artery and carotid artery calcification Skull: No fracture or suspicious bone lesion Other: Mild mucosal thickening in the ethmoid sinuses. No acute orbital abnormality. CT MAXILLOFACIAL FINDINGS Osseous: Bilateral mandibular heads are normally positioned. No mandibular fracture. Zygomatic arches and pterygoid plates are intact. Acute appearing, minimally depressed right nasal bone fracture since comparison study. Orbits: Negative. No traumatic or inflammatory finding. Sinuses: Old appearing fracture deformity of the right lateral, inferior wall of the maxillary sinus. No acute fluid levels. Minimal mucosal thickening in the ethmoid sinuses. No sinus wall fracture Soft tissues: Mild soft tissue swelling over the nasal area and forehead. CT CERVICAL SPINE FINDINGS Alignment: Straightening of the cervical spine. No subluxation. Facet alignment within normal limits Skull base and vertebrae: No acute fracture. No primary bone lesion or focal pathologic process. Soft tissues and spinal canal: No prevertebral fluid or swelling. No visible canal hematoma. Disc levels:  Mild degenerative changes at C6-C7. Upper chest: Negative. Other: Carotid artery calcification IMPRESSION: 1. No CT evidence for acute intracranial abnormality. 2. Straightening of the cervical spine. No acute fracture or malalignment 3. Acute appearing slightly  depressed right nasal bone fracture with overlying soft tissue swelling. Old appearing right lateral maxillary sinus wall fracture. No other acute facial bone fractures are seen 4. Extensive confluent hypodensity within the bilateral white matter presumably due to small vessel change, this is noted on previous exams. Electronically Signed   By: Jasmine Pang M.D.   On: 09/18/2017 20:18   Ct Maxillofacial Wo Contrast  Result Date: 09/18/2017 CLINICAL DATA:  History of multiple falls, bruising to both eye with laceration on bridge of nose EXAM: CT HEAD WITHOUT CONTRAST CT MAXILLOFACIAL WITHOUT CONTRAST CT CERVICAL SPINE WITHOUT CONTRAST TECHNIQUE: Multidetector CT imaging of the head, cervical spine, and maxillofacial structures were performed using the standard protocol without intravenous contrast. Multiplanar CT image reconstructions of the cervical spine and maxillofacial structures were also generated. COMPARISON:  09/12/2017, 08/31/2017, MRI 10/23/2006 FINDINGS: CT HEAD FINDINGS Brain: No acute territorial infarction, hemorrhage, or intracranial mass. Extensive, confluent hypodensity within the bilateral white matter. Old appearing lacunar infarcts within the basal ganglia and thalamus. Moderate atrophy. Stable slightly enlarged ventricles likely due to atrophy Vascular: No hyperdense vessels. Vertebral artery and carotid artery calcification Skull: No fracture or suspicious bone lesion Other: Mild mucosal thickening in the ethmoid sinuses. No acute orbital abnormality. CT MAXILLOFACIAL FINDINGS Osseous: Bilateral mandibular heads  are normally positioned. No mandibular fracture. Zygomatic arches and pterygoid plates are intact. Acute appearing, minimally depressed right nasal bone fracture since comparison study. Orbits: Negative. No traumatic or inflammatory finding. Sinuses: Old appearing fracture deformity of the right lateral, inferior wall of the maxillary sinus. No acute fluid levels. Minimal mucosal  thickening in the ethmoid sinuses. No sinus wall fracture Soft tissues: Mild soft tissue swelling over the nasal area and forehead. CT CERVICAL SPINE FINDINGS Alignment: Straightening of the cervical spine. No subluxation. Facet alignment within normal limits Skull base and vertebrae: No acute fracture. No primary bone lesion or focal pathologic process. Soft tissues and spinal canal: No prevertebral fluid or swelling. No visible canal hematoma. Disc levels:  Mild degenerative changes at C6-C7. Upper chest: Negative. Other: Carotid artery calcification IMPRESSION: 1. No CT evidence for acute intracranial abnormality. 2. Straightening of the cervical spine. No acute fracture or malalignment 3. Acute appearing slightly depressed right nasal bone fracture with overlying soft tissue swelling. Old appearing right lateral maxillary sinus wall fracture. No other acute facial bone fractures are seen 4. Extensive confluent hypodensity within the bilateral white matter presumably due to small vessel change, this is noted on previous exams. Electronically Signed   By: Jasmine Pang M.D.   On: 09/18/2017 20:18    ____________________________________________   PROCEDURES  Procedure(s) performed:   Procedures   ____________________________________________   INITIAL IMPRESSION / ASSESSMENT AND PLAN / ED COURSE  Pertinent labs & imaging results that were available during my care of the patient were reviewed by me and considered in my medical decision making (see chart for details).  Will eval for fx/intracranial injury.   Small nasal fracture, no indication for further workup.  Found to be slightly dehydrated and UTI. Bolus/abx given, stable for dc on antibiotics w/ PCP follow up.  Discussed with husband the recurrent nature of falls and he is going to address it with facility tomorrow.  ____________________________________________  FINAL CLINICAL IMPRESSION(S) / ED DIAGNOSES  Final diagnoses:   Urinary tract infection without hematuria, site unspecified  Closed fracture of nasal bone, initial encounter     MEDICATIONS GIVEN DURING THIS VISIT:  Medications  sodium chloride 0.9 % bolus 1,500 mL (0 mLs Intravenous Stopped 09/18/17 2346)  cefTRIAXone (ROCEPHIN) 1 g in dextrose 5 % 50 mL IVPB (0 g Intravenous Stopped 09/19/17 0024)     NEW OUTPATIENT MEDICATIONS STARTED DURING THIS VISIT:  Discharge Medication List as of 09/19/2017 12:06 AM      Note:  This document was prepared using Dragon voice recognition software and may include unintentional dictation errors.   Marily Memos, MD 09/19/17 1144

## 2017-09-19 DIAGNOSIS — R279 Unspecified lack of coordination: Secondary | ICD-10-CM | POA: Diagnosis not present

## 2017-09-19 DIAGNOSIS — Z743 Need for continuous supervision: Secondary | ICD-10-CM | POA: Diagnosis not present

## 2017-09-19 MED ORDER — CEPHALEXIN 500 MG PO CAPS: 500.0000 mg | ORAL_CAPSULE | Freq: Four times a day (QID) | ORAL | 0 refills | Status: AC

## 2017-09-19 NOTE — ED Notes (Signed)
[facility] here to transport pt, report given to [facility],

## 2017-09-21 ENCOUNTER — Emergency Department (HOSPITAL_COMMUNITY): Payer: BLUE CROSS/BLUE SHIELD

## 2017-09-21 ENCOUNTER — Encounter (HOSPITAL_COMMUNITY): Payer: Self-pay | Admitting: Emergency Medicine

## 2017-09-21 ENCOUNTER — Emergency Department (HOSPITAL_COMMUNITY)
Admission: EM | Admit: 2017-09-21 | Discharge: 2017-09-21 | Disposition: A | Discharge status: Home / Self Care | Payer: BLUE CROSS/BLUE SHIELD | Attending: Emergency Medicine | Admitting: Emergency Medicine

## 2017-09-21 DIAGNOSIS — F4489 Other dissociative and conversion disorders: Secondary | ICD-10-CM | POA: Diagnosis not present

## 2017-09-21 DIAGNOSIS — W19XXXA Unspecified fall, initial encounter: Secondary | ICD-10-CM | POA: Diagnosis not present

## 2017-09-21 DIAGNOSIS — S299XXA Unspecified injury of thorax, initial encounter: Secondary | ICD-10-CM | POA: Diagnosis not present

## 2017-09-21 DIAGNOSIS — S0003XA Contusion of scalp, initial encounter: Secondary | ICD-10-CM | POA: Insufficient documentation

## 2017-09-21 DIAGNOSIS — S0990XA Unspecified injury of head, initial encounter: Secondary | ICD-10-CM | POA: Diagnosis present

## 2017-09-21 DIAGNOSIS — I10 Essential (primary) hypertension: Secondary | ICD-10-CM | POA: Diagnosis not present

## 2017-09-21 DIAGNOSIS — R279 Unspecified lack of coordination: Secondary | ICD-10-CM | POA: Diagnosis not present

## 2017-09-21 DIAGNOSIS — I6789 Other cerebrovascular disease: Secondary | ICD-10-CM | POA: Diagnosis not present

## 2017-09-21 DIAGNOSIS — Y92129 Unspecified place in nursing home as the place of occurrence of the external cause: Secondary | ICD-10-CM | POA: Insufficient documentation

## 2017-09-21 DIAGNOSIS — Y939 Activity, unspecified: Secondary | ICD-10-CM | POA: Insufficient documentation

## 2017-09-21 DIAGNOSIS — Y999 Unspecified external cause status: Secondary | ICD-10-CM | POA: Diagnosis not present

## 2017-09-21 DIAGNOSIS — Z79899 Other long term (current) drug therapy: Secondary | ICD-10-CM | POA: Insufficient documentation

## 2017-09-21 DIAGNOSIS — F0390 Unspecified dementia without behavioral disturbance: Secondary | ICD-10-CM | POA: Diagnosis not present

## 2017-09-21 DIAGNOSIS — R296 Repeated falls: Secondary | ICD-10-CM | POA: Diagnosis not present

## 2017-09-21 DIAGNOSIS — Z743 Need for continuous supervision: Secondary | ICD-10-CM | POA: Diagnosis not present

## 2017-09-21 DIAGNOSIS — F0281 Dementia in other diseases classified elsewhere with behavioral disturbance: Secondary | ICD-10-CM | POA: Diagnosis not present

## 2017-09-21 LAB — CBC WITH DIFFERENTIAL/PLATELET
Basophils Absolute: 0 10*3/uL (ref 0.0–0.1)
Basophils Relative: 0 %
Eosinophils Absolute: 0 10*3/uL (ref 0.0–0.7)
Eosinophils Relative: 1 %
HEMATOCRIT: 28.8 % — AB (ref 36.0–46.0)
HEMOGLOBIN: 9.7 g/dL — AB (ref 12.0–15.0)
LYMPHS ABS: 0.5 10*3/uL — AB (ref 0.7–4.0)
Lymphocytes Relative: 9 %
MCH: 33.1 pg (ref 26.0–34.0)
MCHC: 33.7 g/dL (ref 30.0–36.0)
MCV: 98.3 fL (ref 78.0–100.0)
MONOS PCT: 6 %
Monocytes Absolute: 0.4 10*3/uL (ref 0.1–1.0)
NEUTROS ABS: 4.7 10*3/uL (ref 1.7–7.7)
Neutrophils Relative %: 84 %
Platelets: 186 10*3/uL (ref 150–400)
RBC: 2.93 MIL/uL — AB (ref 3.87–5.11)
RDW: 13.2 % (ref 11.5–15.5)
WBC: 5.6 10*3/uL (ref 4.0–10.5)

## 2017-09-21 LAB — COMPREHENSIVE METABOLIC PANEL
ALK PHOS: 62 U/L (ref 38–126)
ALT: 16 U/L (ref 14–54)
AST: 17 U/L (ref 15–41)
Albumin: 3.3 g/dL — ABNORMAL LOW (ref 3.5–5.0)
Anion gap: 5 (ref 5–15)
BUN: 24 mg/dL — AB (ref 6–20)
CALCIUM: 8.2 mg/dL — AB (ref 8.9–10.3)
CO2: 26 mmol/L (ref 22–32)
CREATININE: 1.16 mg/dL — AB (ref 0.44–1.00)
Chloride: 107 mmol/L (ref 101–111)
GFR calc Af Amer: 57 mL/min — ABNORMAL LOW (ref >60)
GFR, EST NON AFRICAN AMERICAN: 49 mL/min — AB (ref >60)
Glucose, Bld: 127 mg/dL — ABNORMAL HIGH (ref 65–99)
Potassium: 4.8 mmol/L (ref 3.5–5.1)
SODIUM: 138 mmol/L (ref 135–145)
Total Bilirubin: 0.5 mg/dL (ref 0.3–1.2)
Total Protein: 5.6 g/dL — ABNORMAL LOW (ref 6.5–8.1)

## 2017-09-21 NOTE — Discharge Instructions (Signed)
Maria Ortega labs are improved compared to a few days ago.  Please encourage oral fluids.  Please have her follow up with her family doctor about medication options for her anxiety/agitation - this may help her frequent falls.  She does have anemia and mild elevation in her kidney function (creatinine) that will need to be rechecked by her family doctor.

## 2017-09-21 NOTE — ED Notes (Signed)
This nurse called pt spouse to update on disposition.

## 2017-09-21 NOTE — ED Notes (Signed)
Pt husband updated on pt status via telephone.

## 2017-09-21 NOTE — ED Provider Notes (Signed)
[facility]-EM[facility]Y DEPT Provider Note   CSN: [id number] Arrival date & time: 09/21/17  1635     History   Chief Complaint Chief Complaint  Patient presents with  . Fall    HPI Maria Ortega is a 61 y.o. female.  The history is provided by the nursing home and the EMS personnel. No language interpreter was used.  Fall     Maria Ortega is a 61 y.o. female who presents to the Emergency Department complaining of fall.  Level V caveat due to dementia.  Per NH - she is very anxious, constantly moving.  She fell twice today within 20 minutes, both witnessed.  One time she fell backwards and hit the back of her head.  There was no LOC.  In the other fall she lost her balance and fell but did not hurt herself or hit her head.  At baseline she is repetitive and confused.  She has good oral intake but severe anxiety that makes her fidgety.  No recent illnesses.  No fevers, sob, vomiting, diarrhea.    Past Medical History:  Diagnosis Date  . Dementia   . Dementia   . High cholesterol   . Hypertension   . Protein calorie malnutrition (HCC)   . Stroke [facility])     Patient Active Problem List   Diagnosis Date Noted  . CVA (cerebral vascular accident) (HCC) 12/19/2016  . Recurrent falls 12/19/2016  . Dementia 12/19/2016  . UTI (urinary tract infection) 12/15/2016  . Lactic acidosis 12/15/2016  . Weakness 12/15/2016  . Hypertension   . History of stroke   . High cholesterol     History reviewed. No pertinent surgical history.  OB History    No data available       Home Medications    Prior to Admission medications   Medication Sig Start Date End Date Taking? Authorizing Provider  atorvastatin (LIPITOR) 10 MG tablet Take 1 tablet by mouth daily. 12/11/16  Yes [provider]  busPIRone (BUSPAR) 15 MG tablet Take 7.5 mg by mouth 2 (two) times daily.    Yes [provider]  cephALEXin (KEFLEX) 500 MG capsule Take 1 capsule (500 mg total) by mouth 4 (four) times  daily. 09/19/17  Yes Mesner, Barbara Cower, MD  cloNIDine (CATAPRES) 0.2 MG tablet Take 1 tablet by mouth 2 (two) times daily. 12/06/16  Yes [provider]  dipyridamole-aspirin (AGGRENOX) 200-25 MG 12hr capsule TAKE ONE CAPSULE BY MOUTH TWICE DAILY 12/14/15  Yes [provider]    Family History No family history on file.  Social History Social History  Substance Use Topics  . Smoking status: Never Smoker  . Smokeless tobacco: Never Used  . Alcohol use No     Allergies   Patient has no known allergies.   Review of Systems Review of Systems  Unable to perform ROS: Dementia     Physical Exam Updated Vital Signs BP 135/77   Pulse 71   Temp 98.5 F (36.9 C)   Resp 18   Ht  (1.676 m)   Wt 54.4 kg (120 lb)   SpO2 100%   BMI 19.37 kg/m   Physical Exam  Constitutional: She appears well-developed and well-nourished.  HENT:  Head: Normocephalic.  Periorbital ecchymoses bilaterally that appear to be healing  Eyes: Pupils are equal, round, and reactive to light.  Neck: Neck supple.  No cervical tenderness  Cardiovascular: Normal rate and regular rhythm.   No murmur heard. Pulmonary/Chest: Effort normal and  breath sounds normal. No respiratory distress.  Abdominal: Soft. There is no tenderness. There is no rebound and no guarding.  Musculoskeletal: She exhibits no edema or tenderness.  Neurological:  Drowsy but arousable to verbal stimuli. Denies pain.  Skin: Skin is warm and dry.  Psychiatric: She has a normal mood and affect. Her behavior is normal.  Nursing note and vitals reviewed.    ED Treatments / Results  Labs (all labs ordered are listed, but only abnormal results are displayed) Labs Reviewed  COMPREHENSIVE METABOLIC PANEL - Abnormal; Notable for the following:       Result Value   Glucose, Bld 127 (*)    BUN 24 (*)    Creatinine, Ser 1.16 (*)    Calcium 8.2 (*)    Total Protein 5.6 (*)    Albumin 3.3 (*)    GFR calc non Af Amer 49  (*)    GFR calc Af Amer 57 (*)    All other components within normal limits  CBC WITH DIFFERENTIAL/PLATELET - Abnormal; Notable for the following:    RBC 2.93 (*)    Hemoglobin 9.7 (*)    HCT 28.8 (*)    Lymphs Abs 0.5 (*)    All other components within normal limits  URINALYSIS, ROUTINE W REFLEX MICROSCOPIC    EKG  EKG Interpretation  Date/Time:  Saturday September 21 2017 18:07:34 EDT Ventricular Rate:  65 PR Interval:    QRS Duration: 117 QT Interval:  421 QTC Calculation: 438 R Axis:   46 Text Interpretation:  Sinus rhythm Incomplete right bundle branch block Minimal ST elevation, lateral leads No significant change since last tracing Confirmed by Tilden Fossa [phone]) on 09/21/2017 6:32:19 PM       Radiology Dg Chest 1 View  Result Date: 09/21/2017 CLINICAL DATA:  FaLL EXAM: CHEST 1 VIEW COMPARISON:  None. FINDINGS: Normal mediastinum and cardiac silhouette. Normal pulmonary vasculature. No evidence of effusion, infiltrate, or pneumothorax. No acute bony abnormality. IMPRESSION: No acute cardiopulmonary process. Electronically Signed   By: Genevive Bi M.D.   On: 09/21/2017 18:45   Ct Head Wo Contrast  Result Date: 09/21/2017 CLINICAL DATA:  Multiple falls at care facility. On anti coagulation. History of hypertension, stroke, dementia. EXAM: CT HEAD WITHOUT CONTRAST CT CERVICAL SPINE WITHOUT CONTRAST TECHNIQUE: Multidetector CT imaging of the head and cervical spine was performed following the standard protocol without intravenous contrast. Multiplanar CT image reconstructions of the cervical spine were also generated. COMPARISON:  CT HEAD and cervical spine September 18, 2017 FINDINGS: CT HEAD FINDINGS BRAIN: No intraparenchymal hemorrhage, mass effect nor midline shift. Moderate to severe parenchymal brain volume loss, no hydrocephalus. Confluent supratentorial white matter hypodensities. Old bilateral basal ganglia and thalamus lacunar infarcts. No acute large vascular  territory infarcts. No abnormal extra-axial fluid collections. Basal cisterns are patent. VASCULAR: Moderate to severe calcific atherosclerosis of the carotid siphons. SKULL: No skull fracture. No significant scalp soft tissue swelling. SINUSES/ORBITS: The mastoid air-cells and included paranasal sinuses are well-aerated.The included ocular globes and orbital contents are non-suspicious. OTHER: None. CT CERVICAL SPINE FINDINGS- moderately motion degraded examination. ALIGNMENT: Maintained lordosis. Vertebral bodies in alignment. SKULL BASE AND VERTEBRAE: Cervical vertebral bodies and posterior elements are intact. Mild C6-7 disc height loss and endplate spurring compatible with degenerative discs. No destructive bony lesions. C1-2 articulation maintained. SOFT TISSUES AND SPINAL CANAL: Nonacute. Moderate calcific atherosclerosis carotid bifurcations. DISC LEVELS: No significant osseous canal stenosis. At least moderate LEFT C6-7 neural foraminal narrowing. UPPER CHEST: Lung apices  are clear. OTHER: None. IMPRESSION: CT HEAD: 1. No acute intracranial process. 2. Stable moderate to severe atrophy. 3. Stable moderate to severe chronic small vessel ischemic disease and old lacunar infarcts. CT CERVICAL SPINE: 1. No acute fracture or malalignment on this moderately motion degraded examination. Electronically Signed   By: Awilda Metro M.D.   On: 09/21/2017 18:51   Ct Cervical Spine Wo Contrast  Result Date: 09/21/2017 CLINICAL DATA:  Multiple falls at care facility. On anti coagulation. History of hypertension, stroke, dementia. EXAM: CT HEAD WITHOUT CONTRAST CT CERVICAL SPINE WITHOUT CONTRAST TECHNIQUE: Multidetector CT imaging of the head and cervical spine was performed following the standard protocol without intravenous contrast. Multiplanar CT image reconstructions of the cervical spine were also generated. COMPARISON:  CT HEAD and cervical spine September 18, 2017 FINDINGS: CT HEAD FINDINGS BRAIN: No  intraparenchymal hemorrhage, mass effect nor midline shift. Moderate to severe parenchymal brain volume loss, no hydrocephalus. Confluent supratentorial white matter hypodensities. Old bilateral basal ganglia and thalamus lacunar infarcts. No acute large vascular territory infarcts. No abnormal extra-axial fluid collections. Basal cisterns are patent. VASCULAR: Moderate to severe calcific atherosclerosis of the carotid siphons. SKULL: No skull fracture. No significant scalp soft tissue swelling. SINUSES/ORBITS: The mastoid air-cells and included paranasal sinuses are well-aerated.The included ocular globes and orbital contents are non-suspicious. OTHER: None. CT CERVICAL SPINE FINDINGS- moderately motion degraded examination. ALIGNMENT: Maintained lordosis. Vertebral bodies in alignment. SKULL BASE AND VERTEBRAE: Cervical vertebral bodies and posterior elements are intact. Mild C6-7 disc height loss and endplate spurring compatible with degenerative discs. No destructive bony lesions. C1-2 articulation maintained. SOFT TISSUES AND SPINAL CANAL: Nonacute. Moderate calcific atherosclerosis carotid bifurcations. DISC LEVELS: No significant osseous canal stenosis. At least moderate LEFT C6-7 neural foraminal narrowing. UPPER CHEST: Lung apices are clear. OTHER: None. IMPRESSION: CT HEAD: 1. No acute intracranial process. 2. Stable moderate to severe atrophy. 3. Stable moderate to severe chronic small vessel ischemic disease and old lacunar infarcts. CT CERVICAL SPINE: 1. No acute fracture or malalignment on this moderately motion degraded examination. Electronically Signed   By: Awilda Metro M.D.   On: 09/21/2017 18:51    Procedures Procedures (including critical care time)  Medications Ordered in ED Medications - No data to display   Initial Impression / Assessment and Plan / ED Course  I have reviewed the triage vital signs and the nursing notes.  Pertinent labs & imaging results that were available  during my care of the patient were reviewed by me and considered in my medical decision making (see chart for details).     Patient with history of recurrent falls here for evaluation of recurrent falls. She does have advanced dementia which makes examination difficult. She does not have any complaints of pain. No evidence of acute intracranial abnormality. Labs demonstrate some improvement in her renal function compared to priors. Unable to obtain UA but patient is currently on antibiotics for UTI and that she is nontoxic-appearing.During her ED stay she was able to eat and interact with staff without any complaints. No apparent tenderness on her extremities. Plan to discharge back to her facility with outpatient follow-up. Attempted to contact her husband but he was unavailable by phone.  Final Clinical Impressions(s) / ED Diagnoses   Final diagnoses:  Fall, initial encounter  Contusion of scalp, initial encounter    New Prescriptions Discharge Medication List as of 09/21/2017  8:14 PM       Tilden Fossa, MD 09/21/17 2329

## 2017-09-21 NOTE — ED Notes (Signed)
[facility] contacted for transport back to [facility],

## 2017-09-21 NOTE — ED Triage Notes (Signed)
Pt sent to ED for 3 falls in 30 minutes per EMS. Pt is a pt at [facility]. Unknown loc, unknown if falls where witnessed. Pt denies pain. nad noted.

## 2017-09-30 DIAGNOSIS — F0390 Unspecified dementia without behavioral disturbance: Secondary | ICD-10-CM | POA: Diagnosis not present

## 2017-10-02 DIAGNOSIS — F0390 Unspecified dementia without behavioral disturbance: Secondary | ICD-10-CM | POA: Diagnosis not present

## 2017-10-05 ENCOUNTER — Encounter (HOSPITAL_COMMUNITY): Payer: Self-pay | Admitting: Emergency Medicine

## 2017-10-05 ENCOUNTER — Emergency Department (HOSPITAL_COMMUNITY)
Admission: EM | Admit: 2017-10-05 | Discharge: 2017-10-05 | Disposition: A | Discharge status: Skilled Nursing Facility | Payer: BLUE CROSS/BLUE SHIELD | Attending: Emergency Medicine | Admitting: Emergency Medicine

## 2017-10-05 ENCOUNTER — Emergency Department (HOSPITAL_COMMUNITY): Payer: BLUE CROSS/BLUE SHIELD

## 2017-10-05 DIAGNOSIS — Y92129 Unspecified place in nursing home as the place of occurrence of the external cause: Secondary | ICD-10-CM | POA: Insufficient documentation

## 2017-10-05 DIAGNOSIS — F0390 Unspecified dementia without behavioral disturbance: Secondary | ICD-10-CM | POA: Insufficient documentation

## 2017-10-05 DIAGNOSIS — Y998 Other external cause status: Secondary | ICD-10-CM | POA: Insufficient documentation

## 2017-10-05 DIAGNOSIS — F4489 Other dissociative and conversion disorders: Secondary | ICD-10-CM | POA: Diagnosis not present

## 2017-10-05 DIAGNOSIS — Y939 Activity, unspecified: Secondary | ICD-10-CM | POA: Insufficient documentation

## 2017-10-05 DIAGNOSIS — Z79899 Other long term (current) drug therapy: Secondary | ICD-10-CM | POA: Diagnosis not present

## 2017-10-05 DIAGNOSIS — I10 Essential (primary) hypertension: Secondary | ICD-10-CM | POA: Insufficient documentation

## 2017-10-05 DIAGNOSIS — Z8673 Personal history of transient ischemic attack (TIA), and cerebral infarction without residual deficits: Secondary | ICD-10-CM | POA: Diagnosis not present

## 2017-10-05 DIAGNOSIS — S3993XA Unspecified injury of pelvis, initial encounter: Secondary | ICD-10-CM | POA: Diagnosis not present

## 2017-10-05 DIAGNOSIS — Z7401 Bed confinement status: Secondary | ICD-10-CM | POA: Diagnosis not present

## 2017-10-05 DIAGNOSIS — S0990XA Unspecified injury of head, initial encounter: Secondary | ICD-10-CM | POA: Diagnosis not present

## 2017-10-05 DIAGNOSIS — R4182 Altered mental status, unspecified: Secondary | ICD-10-CM | POA: Insufficient documentation

## 2017-10-05 DIAGNOSIS — S0093XA Contusion of unspecified part of head, initial encounter: Secondary | ICD-10-CM | POA: Diagnosis not present

## 2017-10-05 DIAGNOSIS — I6789 Other cerebrovascular disease: Secondary | ICD-10-CM | POA: Diagnosis not present

## 2017-10-05 DIAGNOSIS — W19XXXA Unspecified fall, initial encounter: Secondary | ICD-10-CM | POA: Insufficient documentation

## 2017-10-05 DIAGNOSIS — S199XXA Unspecified injury of neck, initial encounter: Secondary | ICD-10-CM | POA: Diagnosis not present

## 2017-10-05 DIAGNOSIS — R279 Unspecified lack of coordination: Secondary | ICD-10-CM | POA: Diagnosis not present

## 2017-10-05 LAB — I-STAT CHEM 8, ED
BUN: 20 mg/dL (ref 6–20)
Calcium, Ion: 1.13 mmol/L — ABNORMAL LOW (ref 1.15–1.40)
Chloride: 107 mmol/L (ref 101–111)
Creatinine, Ser: 1.1 mg/dL — ABNORMAL HIGH (ref 0.44–1.00)
Glucose, Bld: 115 mg/dL — ABNORMAL HIGH (ref 65–99)
HCT: 23 % — ABNORMAL LOW (ref 36.0–46.0)
HEMOGLOBIN: 7.8 g/dL — AB (ref 12.0–15.0)
Potassium: 4.7 mmol/L (ref 3.5–5.1)
SODIUM: 139 mmol/L (ref 135–145)
TCO2: 25 mmol/L (ref 22–32)

## 2017-10-05 MED ORDER — LORAZEPAM 2 MG/ML IJ SOLN
1.0000 mg | Freq: Once | INTRAMUSCULAR | Status: AC
  Administered 2017-10-05: 1 mg via INTRAVENOUS
  Filled 2017-10-05: qty 1

## 2017-10-05 NOTE — ED Notes (Signed)
EDP at bedside assessing pt.  

## 2017-10-05 NOTE — ED Provider Notes (Signed)
[facility]-EMERG[facility]Y DEPT Provider Note   CSN: [id number] Arrival date & time: 10/05/17  1200     History   Chief Complaint Chief Complaint  Patient presents with  . Fall    HPI Maria Ortega is a 61 y.o. female.  Patient had a fall at the nursing home and hit her head. Questionable loss of consciousness. Her mental status is her normal   The history is provided by the nursing home. No language interpreter was used.  Fall  This is a new problem. The current episode started 3 to 5 hours ago. The problem occurs rarely. The problem has been resolved. Pertinent negatives include no chest pain. Nothing aggravates the symptoms. Nothing relieves the symptoms. She has tried nothing for the symptoms. The treatment provided no relief.    Past Medical History:  Diagnosis Date  . Dementia   . Dementia   . High cholesterol   . Hypertension   . Protein calorie malnutrition (HCC)   . Stroke [facility])     Patient Active Problem List   Diagnosis Date Noted  . CVA (cerebral vascular accident) (HCC) 12/19/2016  . Recurrent falls 12/19/2016  . Dementia 12/19/2016  . UTI (urinary tract infection) 12/15/2016  . Lactic acidosis 12/15/2016  . Weakness 12/15/2016  . Hypertension   . History of stroke   . High cholesterol     History reviewed. No pertinent surgical history.  OB History    No data available       Home Medications    Prior to Admission medications   Medication Sig Start Date End Date Taking? Authorizing Provider  ALPRAZolam (XANAX) 0.25 MG tablet Take 0.25 mg by mouth 2 (two) times daily as needed for anxiety. 10/02/17  Yes [provider]  atorvastatin (LIPITOR) 10 MG tablet Take 1 tablet by mouth daily. 12/11/16  Yes [provider]  busPIRone (BUSPAR) 15 MG tablet Take 7.5 mg by mouth 2 (two) times daily.    Yes [provider]  cephALEXin (KEFLEX) 500 MG capsule Take 1 capsule (500 mg total) by mouth 4 (four) times daily. 09/19/17  Yes Mesner,  Barbara Cower, MD  cloNIDine (CATAPRES) 0.2 MG tablet Take 1 tablet by mouth 2 (two) times daily. 12/06/16  Yes [provider]  dipyridamole-aspirin (AGGRENOX) 200-25 MG 12hr capsule TAKE ONE CAPSULE BY MOUTH TWICE DAILY 12/14/15  Yes [provider]  polyethylene glycol powder (GLYCOLAX/MIRALAX) powder Take 1 g by mouth daily. 09/16/17  Yes [provider]  traZODone (DESYREL) 50 MG tablet Take 50 mg by mouth at bedtime. 09/30/17  Yes [provider]  FLUZONE QUADRIVALENT 0.5 ML injection TO BE ADMINISTERED BY PHARMACIST FOR IMMUNIZATION 09/27/17   [provider]    Family History History reviewed. No pertinent family history.  Social History Social History  Substance Use Topics  . Smoking status: Never Smoker  . Smokeless tobacco: Never Used  . Alcohol use No     Allergies   Patient has no known allergies.   Review of Systems Review of Systems  Unable to perform ROS: Mental status change  Cardiovascular: Negative for chest pain.     Physical Exam Updated Vital Signs BP 108/65 (BP Location: Left Arm)   Pulse 62   Temp 97.8 F (36.6 C) (Temporal)   Resp 18   Ht  (1.676 m)   Wt 54.4 kg (120 lb)   SpO2 100%   BMI 19.37 kg/m   Physical Exam  Constitutional: She appears well-developed.  HENT:  Head: Normocephalic.  Eyes: Conjunctivae and EOM are normal. No scleral icterus.  Neck: Neck supple. No thyromegaly present.  Cardiovascular: Normal rate and regular rhythm.  Exam reveals no gallop and no friction rub.   No murmur heard. Pulmonary/Chest: No stridor. She has no wheezes. She has no rales. She exhibits no tenderness.  Abdominal: She exhibits no distension. There is no tenderness. There is no rebound.  Musculoskeletal: Normal range of motion. She exhibits no edema.  Lymphadenopathy:    She has no cervical adenopathy.  Neurological: She is alert. She exhibits normal muscle tone. Coordination normal.  Patient alert and  oriented to person only  Skin: No rash noted. No erythema.     ED Treatments / Results  Labs (all labs ordered are listed, but only abnormal results are displayed) Labs Reviewed  I-STAT CHEM 8, ED - Abnormal; Notable for the following:       Result Value   Creatinine, Ser 1.10 (*)    Glucose, Bld 115 (*)    Calcium, Ion 1.13 (*)    Hemoglobin 7.8 (*)    HCT 23.0 (*)    All other components within normal limits    EKG  EKG Interpretation None       Radiology Dg Pelvis 1-2 Views  Result Date: 10/05/2017 CLINICAL DATA:  Status post fall.  Combative. EXAM: PELVIS - 1-2 VIEW COMPARISON:  09/18/2017 FINDINGS: Generalized osteopenia. No acute fracture or dislocation. No aggressive lytic or sclerotic osseous lesion. Degenerative disc disease disc height loss at L5-S1. IMPRESSION: No acute osseous injury of the pelvis. Given the patient's age and osteopenia, if there is persistent clinical concern for an occult hip fracture, a MRI of the hip is recommended for increased sensitivity. Electronically Signed   By: Elige Ko   On: 10/05/2017 13:33   Ct Head Wo Contrast  Result Date: 10/05/2017 CLINICAL DATA:  61 year old female with history of 2 falls this morning. Injury to the head. Found on the floor. Dementia. EXAM: CT HEAD WITHOUT CONTRAST CT CERVICAL SPINE WITHOUT CONTRAST TECHNIQUE: Multidetector CT imaging of the head and cervical spine was performed following the standard protocol without intravenous contrast. Multiplanar CT image reconstructions of the cervical spine were also generated. COMPARISON:  Head and cervical spine CT scan 09/21/2017. FINDINGS: CT HEAD FINDINGS Brain: Patchy and confluent areas of decreased attenuation are noted throughout the deep and periventricular white matter of the cerebral hemispheres bilaterally, compatible with chronic microvascular ischemic disease. Mild cerebral and cerebellar atrophy. Mild ex vacuo dilatation of the ventricular system.  Well-defined foci of low attenuation in the left basal ganglia, compatible with old lacunar infarcts. Physiologic calcification of the right basal ganglia. No evidence of acute infarction, hemorrhage, hydrocephalus, extra-axial collection or mass lesion/mass effect. Vascular: No hyperdense vessel or unexpected calcification. Skull: Normal. Negative for fracture or focal lesion. Sinuses/Orbits: No acute finding. Other: None. CT CERVICAL SPINE FINDINGS Alignment: Normal. Skull base and vertebrae: No acute fracture. No primary bone lesion or focal pathologic process. Soft tissues and spinal canal: No prevertebral fluid or swelling. No visible canal hematoma. Disc levels: Minimal multilevel degenerative disc disease and facet arthropathy. Upper chest: Unremarkable. Other:  None. IMPRESSION: 1. No evidence of significant acute traumatic injury to the skull, brain or cervical spine. 2. No acute intracranial abnormalities. 3. Mild cerebral and cerebellar atrophy with extensive chronic microvascular ischemic changes in the cerebral white matter and old lacunar infarcts in the left basal ganglia, similar to the prior examination. Electronically Signed   By:  Trudie Reed M.D.   On: 10/05/2017 16:15   Ct Cervical Spine Wo Contrast  Result Date: 10/05/2017 CLINICAL DATA:  61 year old female with history of 2 falls this morning. Injury to the head. Found on the floor. Dementia. EXAM: CT HEAD WITHOUT CONTRAST CT CERVICAL SPINE WITHOUT CONTRAST TECHNIQUE: Multidetector CT imaging of the head and cervical spine was performed following the standard protocol without intravenous contrast. Multiplanar CT image reconstructions of the cervical spine were also generated. COMPARISON:  Head and cervical spine CT scan 09/21/2017. FINDINGS: CT HEAD FINDINGS Brain: Patchy and confluent areas of decreased attenuation are noted throughout the deep and periventricular white matter of the cerebral hemispheres bilaterally, compatible with  chronic microvascular ischemic disease. Mild cerebral and cerebellar atrophy. Mild ex vacuo dilatation of the ventricular system. Well-defined foci of low attenuation in the left basal ganglia, compatible with old lacunar infarcts. Physiologic calcification of the right basal ganglia. No evidence of acute infarction, hemorrhage, hydrocephalus, extra-axial collection or mass lesion/mass effect. Vascular: No hyperdense vessel or unexpected calcification. Skull: Normal. Negative for fracture or focal lesion. Sinuses/Orbits: No acute finding. Other: None. CT CERVICAL SPINE FINDINGS Alignment: Normal. Skull base and vertebrae: No acute fracture. No primary bone lesion or focal pathologic process. Soft tissues and spinal canal: No prevertebral fluid or swelling. No visible canal hematoma. Disc levels: Minimal multilevel degenerative disc disease and facet arthropathy. Upper chest: Unremarkable. Other:  None. IMPRESSION: 1. No evidence of significant acute traumatic injury to the skull, brain or cervical spine. 2. No acute intracranial abnormalities. 3. Mild cerebral and cerebellar atrophy with extensive chronic microvascular ischemic changes in the cerebral white matter and old lacunar infarcts in the left basal ganglia, similar to the prior examination. Electronically Signed   By: Trudie Reed M.D.   On: 10/05/2017 16:15    Procedures Procedures (including critical care time)  Medications Ordered in ED Medications  LORazepam (ATIVAN) injection 1 mg (not administered)  LORazepam (ATIVAN) injection 1 mg (1 mg Intravenous Given 10/05/17 1513)     Initial Impression / Assessment and Plan / ED Course  I have reviewed the triage vital signs and the nursing notes.  Pertinent labs & imaging results that were available during my care of the patient were reviewed by me and considered in my medical decision making (see chart for details).     Patient with fall nursing home with contusion to head. Patient has  history of strokes.  She will follow up with her md at the [facility]  Final Clinical Impressions(s) / ED Diagnoses   Final diagnoses:  Fall, initial encounter    New Prescriptions New Prescriptions   No medications on file     Bethann Berkshire, MD 10/05/17 1646

## 2017-10-05 NOTE — ED Triage Notes (Addendum)
Pt from [facility] after falling x2 this am. Pt alert to voice. Pt denies pain with EMS and at time of arrival to ED. Facility reports pt hit head at time of initial fall but reports second fall unwitnessed. Pt found on floor by wheelchair. [facility] en route 132.

## 2017-10-05 NOTE — ED Notes (Signed)
Pt awaiting EMS transport.

## 2017-10-05 NOTE — ED Notes (Signed)
Pts husband called and stated that pt has been prescribed xanax at night.

## 2017-10-05 NOTE — Discharge Instructions (Signed)
Follow up with your md as needed °

## 2017-10-07 DIAGNOSIS — F0390 Unspecified dementia without behavioral disturbance: Secondary | ICD-10-CM | POA: Diagnosis not present

## 2017-10-07 DIAGNOSIS — R296 Repeated falls: Secondary | ICD-10-CM | POA: Diagnosis not present

## 2017-10-09 DIAGNOSIS — R531 Weakness: Secondary | ICD-10-CM | POA: Diagnosis not present

## 2017-10-15 DIAGNOSIS — I779 Disorder of arteries and arterioles, unspecified: Secondary | ICD-10-CM | POA: Diagnosis not present

## 2017-10-15 DIAGNOSIS — R634 Abnormal weight loss: Secondary | ICD-10-CM | POA: Diagnosis not present

## 2017-10-15 DIAGNOSIS — R296 Repeated falls: Secondary | ICD-10-CM | POA: Diagnosis not present

## 2017-10-15 DIAGNOSIS — I10 Essential (primary) hypertension: Secondary | ICD-10-CM | POA: Diagnosis not present

## 2017-10-15 DIAGNOSIS — R410 Disorientation, unspecified: Secondary | ICD-10-CM | POA: Diagnosis not present

## 2017-10-15 DIAGNOSIS — I6523 Occlusion and stenosis of bilateral carotid arteries: Secondary | ICD-10-CM | POA: Diagnosis not present

## 2017-10-15 DIAGNOSIS — Z515 Encounter for palliative care: Secondary | ICD-10-CM | POA: Diagnosis not present

## 2017-10-15 DIAGNOSIS — R451 Restlessness and agitation: Secondary | ICD-10-CM | POA: Diagnosis not present

## 2017-10-16 DIAGNOSIS — R296 Repeated falls: Secondary | ICD-10-CM | POA: Diagnosis not present

## 2017-10-16 DIAGNOSIS — I779 Disorder of arteries and arterioles, unspecified: Secondary | ICD-10-CM | POA: Diagnosis not present

## 2017-10-16 DIAGNOSIS — R410 Disorientation, unspecified: Secondary | ICD-10-CM | POA: Diagnosis not present

## 2017-10-16 DIAGNOSIS — R451 Restlessness and agitation: Secondary | ICD-10-CM | POA: Diagnosis not present

## 2017-10-16 DIAGNOSIS — R634 Abnormal weight loss: Secondary | ICD-10-CM | POA: Diagnosis not present

## 2017-10-16 DIAGNOSIS — Z515 Encounter for palliative care: Secondary | ICD-10-CM | POA: Diagnosis not present

## 2017-10-16 DIAGNOSIS — I6523 Occlusion and stenosis of bilateral carotid arteries: Secondary | ICD-10-CM | POA: Diagnosis not present

## 2017-10-16 DIAGNOSIS — I10 Essential (primary) hypertension: Secondary | ICD-10-CM | POA: Diagnosis not present

## 2017-10-17 DIAGNOSIS — R296 Repeated falls: Secondary | ICD-10-CM | POA: Diagnosis not present

## 2017-10-17 DIAGNOSIS — Z515 Encounter for palliative care: Secondary | ICD-10-CM | POA: Diagnosis not present

## 2017-10-17 DIAGNOSIS — R634 Abnormal weight loss: Secondary | ICD-10-CM | POA: Diagnosis not present

## 2017-10-17 DIAGNOSIS — I6523 Occlusion and stenosis of bilateral carotid arteries: Secondary | ICD-10-CM | POA: Diagnosis not present

## 2017-10-17 DIAGNOSIS — I10 Essential (primary) hypertension: Secondary | ICD-10-CM | POA: Diagnosis not present

## 2017-10-17 DIAGNOSIS — I779 Disorder of arteries and arterioles, unspecified: Secondary | ICD-10-CM | POA: Diagnosis not present

## 2017-10-17 DIAGNOSIS — R410 Disorientation, unspecified: Secondary | ICD-10-CM | POA: Diagnosis not present

## 2017-10-17 DIAGNOSIS — R451 Restlessness and agitation: Secondary | ICD-10-CM | POA: Diagnosis not present

## 2017-10-18 DIAGNOSIS — I6523 Occlusion and stenosis of bilateral carotid arteries: Secondary | ICD-10-CM | POA: Diagnosis not present

## 2017-10-18 DIAGNOSIS — R410 Disorientation, unspecified: Secondary | ICD-10-CM | POA: Diagnosis not present

## 2017-10-18 DIAGNOSIS — R634 Abnormal weight loss: Secondary | ICD-10-CM | POA: Diagnosis not present

## 2017-10-18 DIAGNOSIS — F0390 Unspecified dementia without behavioral disturbance: Secondary | ICD-10-CM | POA: Diagnosis not present

## 2017-10-18 DIAGNOSIS — I779 Disorder of arteries and arterioles, unspecified: Secondary | ICD-10-CM | POA: Diagnosis not present

## 2017-10-18 DIAGNOSIS — R451 Restlessness and agitation: Secondary | ICD-10-CM | POA: Diagnosis not present

## 2017-10-18 DIAGNOSIS — R296 Repeated falls: Secondary | ICD-10-CM | POA: Diagnosis not present

## 2017-10-18 DIAGNOSIS — I10 Essential (primary) hypertension: Secondary | ICD-10-CM | POA: Diagnosis not present

## 2017-10-18 DIAGNOSIS — Z515 Encounter for palliative care: Secondary | ICD-10-CM | POA: Diagnosis not present

## 2017-10-19 DIAGNOSIS — R410 Disorientation, unspecified: Secondary | ICD-10-CM | POA: Diagnosis not present

## 2017-10-19 DIAGNOSIS — R451 Restlessness and agitation: Secondary | ICD-10-CM | POA: Diagnosis not present

## 2017-10-19 DIAGNOSIS — I10 Essential (primary) hypertension: Secondary | ICD-10-CM | POA: Diagnosis not present

## 2017-10-19 DIAGNOSIS — R296 Repeated falls: Secondary | ICD-10-CM | POA: Diagnosis not present

## 2017-10-19 DIAGNOSIS — Z515 Encounter for palliative care: Secondary | ICD-10-CM | POA: Diagnosis not present

## 2017-10-19 DIAGNOSIS — I779 Disorder of arteries and arterioles, unspecified: Secondary | ICD-10-CM | POA: Diagnosis not present

## 2017-10-19 DIAGNOSIS — R634 Abnormal weight loss: Secondary | ICD-10-CM | POA: Diagnosis not present

## 2017-10-19 DIAGNOSIS — I6523 Occlusion and stenosis of bilateral carotid arteries: Secondary | ICD-10-CM | POA: Diagnosis not present

## 2017-10-20 DIAGNOSIS — R296 Repeated falls: Secondary | ICD-10-CM | POA: Diagnosis not present

## 2017-10-20 DIAGNOSIS — R634 Abnormal weight loss: Secondary | ICD-10-CM | POA: Diagnosis not present

## 2017-10-20 DIAGNOSIS — R451 Restlessness and agitation: Secondary | ICD-10-CM | POA: Diagnosis not present

## 2017-10-20 DIAGNOSIS — Z515 Encounter for palliative care: Secondary | ICD-10-CM | POA: Diagnosis not present

## 2017-10-20 DIAGNOSIS — R410 Disorientation, unspecified: Secondary | ICD-10-CM | POA: Diagnosis not present

## 2017-10-20 DIAGNOSIS — I779 Disorder of arteries and arterioles, unspecified: Secondary | ICD-10-CM | POA: Diagnosis not present

## 2017-10-20 DIAGNOSIS — I6523 Occlusion and stenosis of bilateral carotid arteries: Secondary | ICD-10-CM | POA: Diagnosis not present

## 2017-10-20 DIAGNOSIS — I10 Essential (primary) hypertension: Secondary | ICD-10-CM | POA: Diagnosis not present

## 2017-10-21 ENCOUNTER — Emergency Department (HOSPITAL_COMMUNITY): Payer: BLUE CROSS/BLUE SHIELD

## 2017-10-21 ENCOUNTER — Encounter (HOSPITAL_COMMUNITY): Payer: Self-pay | Admitting: *Deleted

## 2017-10-21 ENCOUNTER — Emergency Department (HOSPITAL_COMMUNITY)
Admission: EM | Admit: 2017-10-21 | Discharge: 2017-10-21 | Disposition: A | Discharge status: Nursing Home (Includes ICF) | Payer: BLUE CROSS/BLUE SHIELD | Attending: Emergency Medicine | Admitting: Emergency Medicine

## 2017-10-21 DIAGNOSIS — F0390 Unspecified dementia without behavioral disturbance: Secondary | ICD-10-CM | POA: Insufficient documentation

## 2017-10-21 DIAGNOSIS — Z79899 Other long term (current) drug therapy: Secondary | ICD-10-CM | POA: Diagnosis not present

## 2017-10-21 DIAGNOSIS — Y939 Activity, unspecified: Secondary | ICD-10-CM | POA: Diagnosis not present

## 2017-10-21 DIAGNOSIS — S199XXA Unspecified injury of neck, initial encounter: Secondary | ICD-10-CM | POA: Diagnosis not present

## 2017-10-21 DIAGNOSIS — T7840XA Allergy, unspecified, initial encounter: Secondary | ICD-10-CM | POA: Insufficient documentation

## 2017-10-21 DIAGNOSIS — I779 Disorder of arteries and arterioles, unspecified: Secondary | ICD-10-CM | POA: Diagnosis not present

## 2017-10-21 DIAGNOSIS — Y999 Unspecified external cause status: Secondary | ICD-10-CM | POA: Diagnosis not present

## 2017-10-21 DIAGNOSIS — S0990XA Unspecified injury of head, initial encounter: Secondary | ICD-10-CM | POA: Diagnosis not present

## 2017-10-21 DIAGNOSIS — R410 Disorientation, unspecified: Secondary | ICD-10-CM | POA: Diagnosis not present

## 2017-10-21 DIAGNOSIS — R402411 Glasgow coma scale score 13-15, in the field [EMT or ambulance]: Secondary | ICD-10-CM | POA: Diagnosis not present

## 2017-10-21 DIAGNOSIS — Y92129 Unspecified place in nursing home as the place of occurrence of the external cause: Secondary | ICD-10-CM | POA: Diagnosis not present

## 2017-10-21 DIAGNOSIS — R634 Abnormal weight loss: Secondary | ICD-10-CM | POA: Diagnosis not present

## 2017-10-21 DIAGNOSIS — S3993XA Unspecified injury of pelvis, initial encounter: Secondary | ICD-10-CM | POA: Diagnosis not present

## 2017-10-21 DIAGNOSIS — R296 Repeated falls: Secondary | ICD-10-CM | POA: Diagnosis not present

## 2017-10-21 DIAGNOSIS — R279 Unspecified lack of coordination: Secondary | ICD-10-CM | POA: Diagnosis not present

## 2017-10-21 DIAGNOSIS — I10 Essential (primary) hypertension: Secondary | ICD-10-CM | POA: Diagnosis not present

## 2017-10-21 DIAGNOSIS — W01198A Fall on same level from slipping, tripping and stumbling with subsequent striking against other object, initial encounter: Secondary | ICD-10-CM | POA: Diagnosis not present

## 2017-10-21 DIAGNOSIS — R21 Rash and other nonspecific skin eruption: Secondary | ICD-10-CM | POA: Diagnosis not present

## 2017-10-21 DIAGNOSIS — W19XXXA Unspecified fall, initial encounter: Secondary | ICD-10-CM

## 2017-10-21 DIAGNOSIS — Z743 Need for continuous supervision: Secondary | ICD-10-CM | POA: Diagnosis not present

## 2017-10-21 DIAGNOSIS — R51 Headache: Secondary | ICD-10-CM | POA: Diagnosis not present

## 2017-10-21 DIAGNOSIS — Z515 Encounter for palliative care: Secondary | ICD-10-CM | POA: Diagnosis not present

## 2017-10-21 DIAGNOSIS — M542 Cervicalgia: Secondary | ICD-10-CM | POA: Diagnosis not present

## 2017-10-21 DIAGNOSIS — I6523 Occlusion and stenosis of bilateral carotid arteries: Secondary | ICD-10-CM | POA: Diagnosis not present

## 2017-10-21 DIAGNOSIS — R451 Restlessness and agitation: Secondary | ICD-10-CM | POA: Diagnosis not present

## 2017-10-21 MED ORDER — DIPHENHYDRAMINE HCL 50 MG/ML IJ SOLN
25.0000 mg | Freq: Once | INTRAMUSCULAR | Status: AC
  Administered 2017-10-21: 25 mg via INTRAMUSCULAR
  Filled 2017-10-21: qty 1

## 2017-10-21 MED ORDER — LORAZEPAM 2 MG/ML IJ SOLN
INTRAMUSCULAR | Status: AC
  Filled 2017-10-21: qty 1

## 2017-10-21 MED ORDER — EPINEPHRINE 0.3 MG/0.3ML IJ SOAJ
0.3000 mg | Freq: Once | INTRAMUSCULAR | Status: AC
  Administered 2017-10-21: 0.3 mg via INTRAMUSCULAR
  Filled 2017-10-21: qty 0.3

## 2017-10-21 MED ORDER — LORAZEPAM 2 MG/ML IJ SOLN
0.5000 mg | Freq: Once | INTRAMUSCULAR | Status: AC
  Administered 2017-10-21: 0.5 mg via INTRAVENOUS

## 2017-10-21 MED ORDER — LORAZEPAM 0.5 MG PO TABS: 0.5000 mg | ORAL_TABLET | Freq: Once | ORAL | Status: DC

## 2017-10-21 NOTE — ED Notes (Signed)
Pt gone to xray

## 2017-10-21 NOTE — ED Notes (Signed)
EMS called for an update. States they have no trucks in the [place] they [place] [facility].

## 2017-10-21 NOTE — ED Triage Notes (Signed)
Pt comes in room [facility] for an unwitnessed fall. EMS states there was a hole in the wall upon their arrival, unsure what body part hit the wall. She has alzheimer's and is at her baseline at time. She is alert.

## 2017-10-21 NOTE — ED Provider Notes (Signed)
[facility] EMERGENCY DEPARTMENT Provider Note   CSN: [id number] Arrival date & time: 10/21/17  4098     History   Chief Complaint Chief Complaint  Patient presents with  . Fall    HPI Shron A Steinhilber is a 61 y.o. female.  Patient fell at the nursing home and hit her head on the wall.   The history is provided by the nursing home. No language interpreter was used.  Fall  This is a new problem. The current episode started 3 to 5 hours ago. The problem occurs rarely. The problem has been resolved. Pertinent negatives include no chest pain. Nothing aggravates the symptoms. Nothing relieves the symptoms. She has tried nothing for the symptoms. The treatment provided no relief.    Past Medical History:  Diagnosis Date  . Dementia   . Dementia   . High cholesterol   . Hypertension   . Protein calorie malnutrition (HCC)   . Stroke [facility])     Patient Active Problem List   Diagnosis Date Noted  . CVA (cerebral vascular accident) (HCC) 12/19/2016  . Recurrent falls 12/19/2016  . Dementia 12/19/2016  . UTI (urinary tract infection) 12/15/2016  . Lactic acidosis 12/15/2016  . Weakness 12/15/2016  . Hypertension   . History of stroke   . High cholesterol     History reviewed. No pertinent surgical history.  OB History    No data available       Home Medications    Prior to Admission medications   Medication Sig Start Date End Date Taking? Authorizing Provider  atorvastatin (LIPITOR) 10 MG tablet Take 1 tablet by mouth daily. 12/11/16  Yes [provider]  busPIRone (BUSPAR) 15 MG tablet Take 7.5 mg by mouth 2 (two) times daily.    Yes [provider]  cloNIDine (CATAPRES) 0.2 MG tablet Take 1 tablet by mouth 2 (two) times daily. 12/06/16  Yes [provider]  dipyridamole-aspirin (AGGRENOX) 200-25 MG 12hr capsule TAKE ONE CAPSULE BY MOUTH TWICE DAILY 12/14/15  Yes [provider]  FLUZONE QUADRIVALENT 0.5 ML injection TO BE  ADMINISTERED BY PHARMACIST FOR IMMUNIZATION 09/27/17  Yes [provider]  LORazepam (ATIVAN) 0.5 MG tablet Take 0.5 mg by mouth 2 (two) times daily.   Yes [provider]  polyethylene glycol powder (GLYCOLAX/MIRALAX) powder Take 1 g by mouth daily. 09/16/17  Yes [provider]  traZODone (DESYREL) 50 MG tablet Take 50 mg by mouth at bedtime. 09/30/17  Yes [provider]  cephALEXin (KEFLEX) 500 MG capsule Take 1 capsule (500 mg total) by mouth 4 (four) times daily. Patient not taking: Reported on 10/21/2017 09/19/17   Mesner, Barbara Cower, MD    Family History No family history on file.  Social History Social History  Substance Use Topics  . Smoking status: Never Smoker  . Smokeless tobacco: Never Used  . Alcohol use No     Allergies   Patient has no known allergies.   Review of Systems Review of Systems  Unable to perform ROS: Dementia  Cardiovascular: Negative for chest pain.     Physical Exam Updated Vital Signs BP (!) 196/89   Pulse 85   Temp 98.7 F (37.1 C) (Temporal)   Resp 20   Ht 5\' 6"  (1.676 m)   Wt 54.4 kg (120 lb)   SpO2 100%   BMI 19.37 kg/m   Physical Exam  Constitutional: She appears well-developed.  HENT:  Head: Normocephalic.  Eyes: Conjunctivae and EOM are normal. No  scleral icterus.  Neck: Neck supple. No thyromegaly present.  Cardiovascular: Normal rate and regular rhythm.  Exam reveals no gallop and no friction rub.   No murmur heard. Pulmonary/Chest: No stridor. She has no wheezes. She has no rales. She exhibits no tenderness.  Abdominal: She exhibits no distension. There is no tenderness. There is no rebound.  Musculoskeletal: Normal range of motion. She exhibits no edema.  Lymphadenopathy:    She has no cervical adenopathy.  Neurological: She is alert. She exhibits normal muscle tone. Coordination normal.  Patient is alert but does not talk this is her normal  Skin: No rash noted. No erythema.     ED  Treatments / Results  Labs (all labs ordered are listed, but only abnormal results are displayed) Labs Reviewed - No data to display  EKG  EKG Interpretation None       Radiology Dg Pelvis 1-2 Views  Result Date: 10/21/2017 CLINICAL DATA:  Fall. EXAM: PELVIS - 1-2 VIEW COMPARISON:  Pelvic x-rays dated October 05, 2017. FINDINGS: No acute fracture or malalignment. The symphysis and sacroiliac joints are intact. Degenerative changes of the lower lumbar spine. Diffuse osteopenia. IMPRESSION: No acute fracture or malalignment. If occult hip fracture is suspected or if the patient is unable to bear weight, MRI is the preferred modality for further evaluation. Electronically Signed   By: Obie Dredge M.D.   On: 10/21/2017 10:54   Ct Head Wo Contrast  Result Date: 10/21/2017 CLINICAL DATA:  Pain following unwitnessed fall EXAM: CT HEAD WITHOUT CONTRAST CT CERVICAL SPINE WITHOUT CONTRAST TECHNIQUE: Multidetector CT imaging of the head and cervical spine was performed following the standard protocol without intravenous contrast. Multiplanar CT image reconstructions of the cervical spine were also generated. COMPARISON:  CT head and CT cervical spine October 05, 2017. FINDINGS: CT HEAD Brain: There is moderate diffuse atrophy, stable. There is no intracranial mass, hemorrhage, extra-axial fluid collection, or midline shift. There is small vessel disease throughout the centra semiovale bilaterally. There are lacunar type infarcts throughout both basal ganglia regions, somewhat more on the left than on the right. There is small vessel disease in each thalamus. There is no new gray-white compartment lesion. There is no evident acute infarct. Vascular: There is no hyperdense vessel evident. There is extensive calcification in each carotid siphon region. There is also distal vertebral artery calcification bilaterally. Skull: The bony calvarium appears intact. Sinuses/Orbits: There is mucosal thickening in  several ethmoid air cells bilaterally. There is incomplete visualization of an apparent retention cyst in the posterior left maxillary antrum. Other visualized paranasal sinuses elsewhere clear. Orbits appear symmetric bilaterally. Other: Mastoid air cells are clear. There is debris in the left external auditory canal. CT CERVICAL SPINE FINDINGS Alignment: There is no spondylolisthesis. Skull base and vertebrae: Skull base and craniocervical junction regions appear normal. No evident fracture. There are no blastic or lytic bone lesions. Soft tissues and spinal canal: Prevertebral soft tissues and predental space regions are normal. No paraspinous lesions. No cord or canal hematoma evident. Disc levels: There is slight disc space narrowing at C6-7. Other disc spaces appear unremarkable. There is mild facet osteoarthritic changes several levels. No nerve root edema or effacement. No disc extrusion or stenosis. Upper chest: Visualized upper lung zones are clear. Other: There is bilateral carotid artery calcification. IMPRESSION: CT head: 1. Atrophy with extensive supratentorial small vessel disease, stable. No intracranial mass, hemorrhage, or extra-axial fluid collection. No acute infarct evident. 2.  There are foci of arterial vascular  calcification. 3.  There are foci of paranasal sinus disease. 4.  There is probable cerumen in the left external auditory canal. CT cervical spine: 1.  No fracture or spondylolisthesis. 2. No disc extrusion or stenosis. Relatively mild osteoarthritic change. 3.  Bilateral carotid artery calcification. Electronically Signed   By: Bretta BangWilliam  Woodruff III M.D.   On: 10/21/2017 10:55   Ct Cervical Spine Wo Contrast  Result Date: 10/21/2017 CLINICAL DATA:  Pain following unwitnessed fall EXAM: CT HEAD WITHOUT CONTRAST CT CERVICAL SPINE WITHOUT CONTRAST TECHNIQUE: Multidetector CT imaging of the head and cervical spine was performed following the standard protocol without intravenous  contrast. Multiplanar CT image reconstructions of the cervical spine were also generated. COMPARISON:  CT head and CT cervical spine October 05, 2017. FINDINGS: CT HEAD Brain: There is moderate diffuse atrophy, stable. There is no intracranial mass, hemorrhage, extra-axial fluid collection, or midline shift. There is small vessel disease throughout the centra semiovale bilaterally. There are lacunar type infarcts throughout both basal ganglia regions, somewhat more on the left than on the right. There is small vessel disease in each thalamus. There is no new gray-white compartment lesion. There is no evident acute infarct. Vascular: There is no hyperdense vessel evident. There is extensive calcification in each carotid siphon region. There is also distal vertebral artery calcification bilaterally. Skull: The bony calvarium appears intact. Sinuses/Orbits: There is mucosal thickening in several ethmoid air cells bilaterally. There is incomplete visualization of an apparent retention cyst in the posterior left maxillary antrum. Other visualized paranasal sinuses elsewhere clear. Orbits appear symmetric bilaterally. Other: Mastoid air cells are clear. There is debris in the left external auditory canal. CT CERVICAL SPINE FINDINGS Alignment: There is no spondylolisthesis. Skull base and vertebrae: Skull base and craniocervical junction regions appear normal. No evident fracture. There are no blastic or lytic bone lesions. Soft tissues and spinal canal: Prevertebral soft tissues and predental space regions are normal. No paraspinous lesions. No cord or canal hematoma evident. Disc levels: There is slight disc space narrowing at C6-7. Other disc spaces appear unremarkable. There is mild facet osteoarthritic changes several levels. No nerve root edema or effacement. No disc extrusion or stenosis. Upper chest: Visualized upper lung zones are clear. Other: There is bilateral carotid artery calcification. IMPRESSION: CT head:  1. Atrophy with extensive supratentorial small vessel disease, stable. No intracranial mass, hemorrhage, or extra-axial fluid collection. No acute infarct evident. 2.  There are foci of arterial vascular calcification. 3.  There are foci of paranasal sinus disease. 4.  There is probable cerumen in the left external auditory canal. CT cervical spine: 1.  No fracture or spondylolisthesis. 2. No disc extrusion or stenosis. Relatively mild osteoarthritic change. 3.  Bilateral carotid artery calcification. Electronically Signed   By: Bretta BangWilliam  Woodruff III M.D.   On: 10/21/2017 10:55    Procedures Procedures (including critical care time)  Medications Ordered in ED Medications  LORazepam (ATIVAN) injection 0.5 mg (0.5 mg Intravenous Given 10/21/17 1009)     Initial Impression / Assessment and Plan / ED Course  I have reviewed the triage vital signs and the nursing notes.  Pertinent labs & imaging results that were available during my care of the patient were reviewed by me and considered in my medical decision making (see chart for details).   Patient fell hit her head CT scan of the head and neck negative.  Pelvic x-ray is negative also.  Patient has contusion to head without significant injury.  Final Clinical Impressions(s) /  ED Diagnoses   Final diagnoses:  Fall, initial encounter    New Prescriptions New Prescriptions   No medications on file     Bethann Berkshire, MD 10/21/17 1235

## 2017-10-21 NOTE — ED Notes (Signed)
EMS called for transport back to [facility].

## 2017-10-21 NOTE — ED Notes (Signed)
Pt has red rash to chin, neck, and chest.  EDP aware meds ordered.  Will notify nursing facility that pt is not ready for discharge at this time.

## 2017-10-21 NOTE — ED Notes (Signed)
Pt back from CT. She is sleeping, equal rise and fall noted to chest

## 2017-10-21 NOTE — ED Provider Notes (Signed)
Patient developed an allergic reaction probably secondary to Ativan.  She had a rash to her chest and face.  Patient was given Benadryl and epinephrine and the rash resolved  Patient was hemodynamically stable and was discharged home   Bethann BerkshireZammit, Theodore Rahrig, MD 10/21/17 1424

## 2017-10-21 NOTE — ED Notes (Signed)
EDP at bedside  

## 2017-10-21 NOTE — ED Notes (Signed)
EMS at bedside

## 2017-10-21 NOTE — ED Notes (Signed)
Spoke with Maria [facility]tega at [employer] will be sending a truck to transfer pt back to [facility].

## 2017-10-21 NOTE — ED Notes (Signed)
EDP states pt can return to facility at this time.

## 2017-10-21 NOTE — Discharge Instructions (Signed)
Follow up with your md if any problems °

## 2017-10-21 NOTE — ED Notes (Signed)
EMS called

## 2017-10-21 NOTE — ED Notes (Signed)
Bed alarm placed under the patient at this time. RN aware.

## 2017-10-22 DIAGNOSIS — R296 Repeated falls: Secondary | ICD-10-CM | POA: Diagnosis not present

## 2017-10-22 DIAGNOSIS — R410 Disorientation, unspecified: Secondary | ICD-10-CM | POA: Diagnosis not present

## 2017-10-22 DIAGNOSIS — I10 Essential (primary) hypertension: Secondary | ICD-10-CM | POA: Diagnosis not present

## 2017-10-22 DIAGNOSIS — R634 Abnormal weight loss: Secondary | ICD-10-CM | POA: Diagnosis not present

## 2017-10-22 DIAGNOSIS — I779 Disorder of arteries and arterioles, unspecified: Secondary | ICD-10-CM | POA: Diagnosis not present

## 2017-10-22 DIAGNOSIS — R451 Restlessness and agitation: Secondary | ICD-10-CM | POA: Diagnosis not present

## 2017-10-22 DIAGNOSIS — I6523 Occlusion and stenosis of bilateral carotid arteries: Secondary | ICD-10-CM | POA: Diagnosis not present

## 2017-10-22 DIAGNOSIS — Z515 Encounter for palliative care: Secondary | ICD-10-CM | POA: Diagnosis not present

## 2017-10-23 DIAGNOSIS — Z515 Encounter for palliative care: Secondary | ICD-10-CM | POA: Diagnosis not present

## 2017-10-23 DIAGNOSIS — R451 Restlessness and agitation: Secondary | ICD-10-CM | POA: Diagnosis not present

## 2017-10-23 DIAGNOSIS — I779 Disorder of arteries and arterioles, unspecified: Secondary | ICD-10-CM | POA: Diagnosis not present

## 2017-10-23 DIAGNOSIS — R296 Repeated falls: Secondary | ICD-10-CM | POA: Diagnosis not present

## 2017-10-23 DIAGNOSIS — R634 Abnormal weight loss: Secondary | ICD-10-CM | POA: Diagnosis not present

## 2017-10-23 DIAGNOSIS — I10 Essential (primary) hypertension: Secondary | ICD-10-CM | POA: Diagnosis not present

## 2017-10-23 DIAGNOSIS — R410 Disorientation, unspecified: Secondary | ICD-10-CM | POA: Diagnosis not present

## 2017-10-23 DIAGNOSIS — I6523 Occlusion and stenosis of bilateral carotid arteries: Secondary | ICD-10-CM | POA: Diagnosis not present

## 2017-10-23 IMAGING — CT CT HEAD W/O CM
4 series · 16 of 47 positions shown, 18 images · non-contrast
Comparison: CT head 07/26/2017, 12/15/2016 and earlier. MRI brain
10/23/2006.

CLINICAL DATA: 61-year-old found on the floor at the [HOSPITAL]
earlier today, possibly an unwitnessed fall, with acute mental
status changes. Current history of dementia. Initial encounter.

EXAM:
CT HEAD WITHOUT CONTRAST
TECHNIQUE: Contiguous axial images were obtained from the base of the skull
through the vertex without intravenous contrast.

[Series 4: head wo · axial · 0.39mm/px · z∈[-110,+10]mm · 7 of 33 slices shown, 9 images]
[im 5/33  brain]
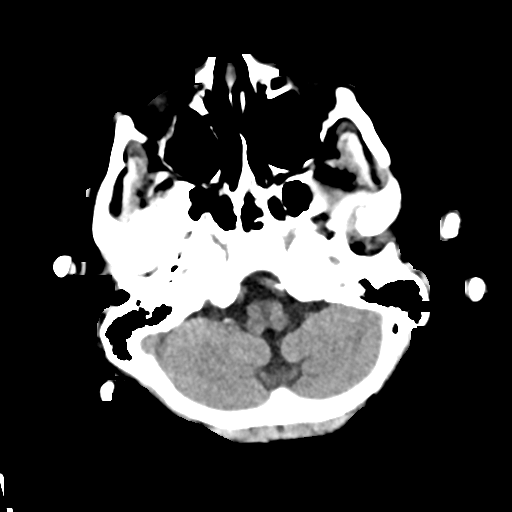
[im 5/33  bone]
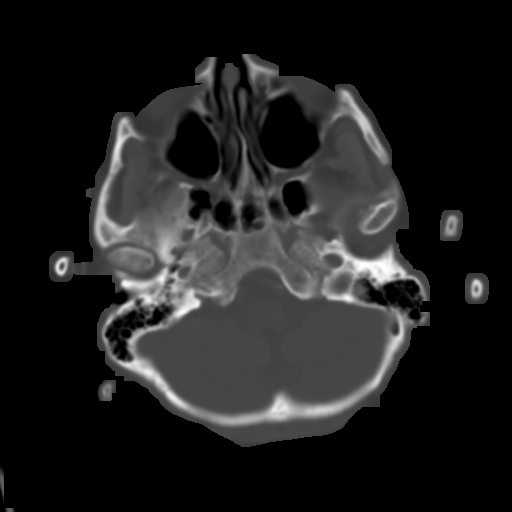
[im 9/33  brain]
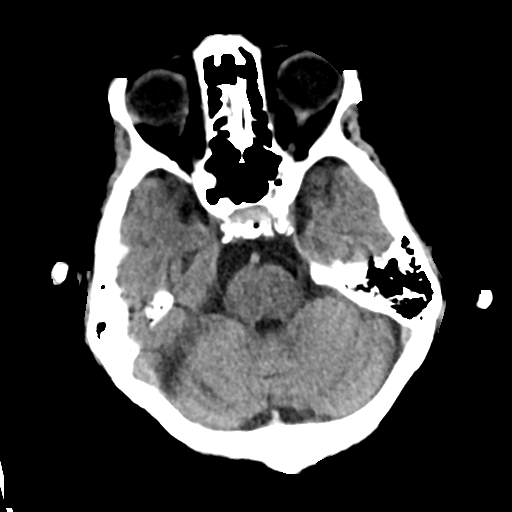
[im 13/33  brain]
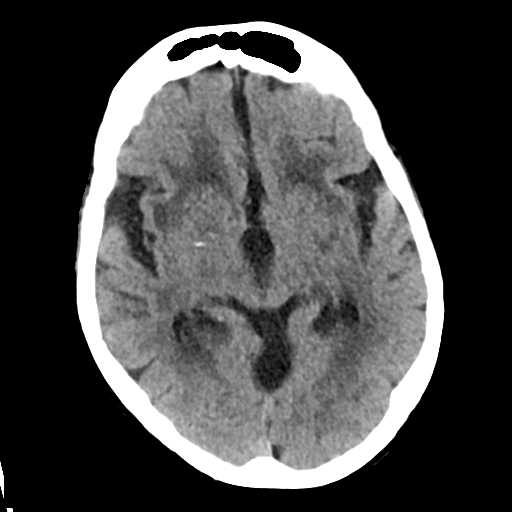
[im 17/33  brain]
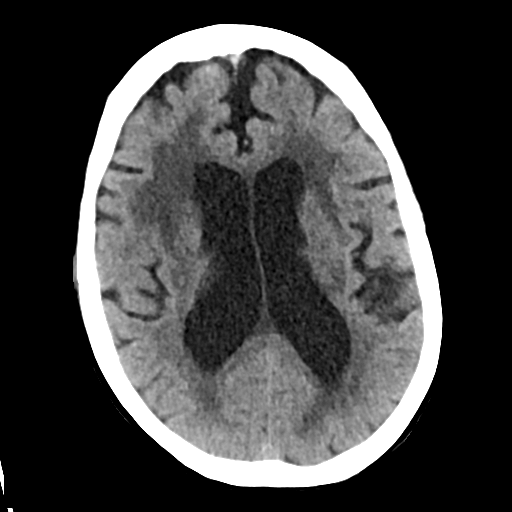
[im 21/33  brain]
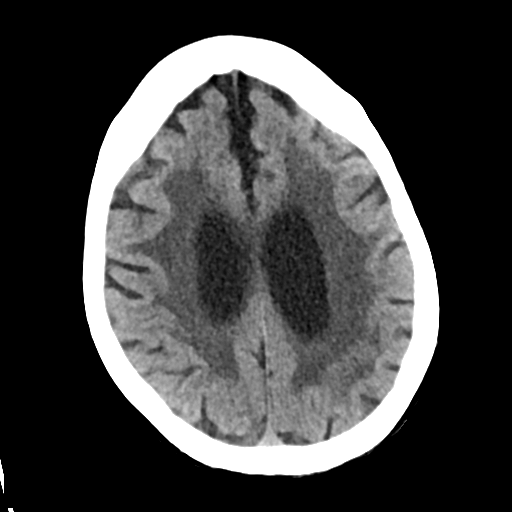
[im 21/33  bone]
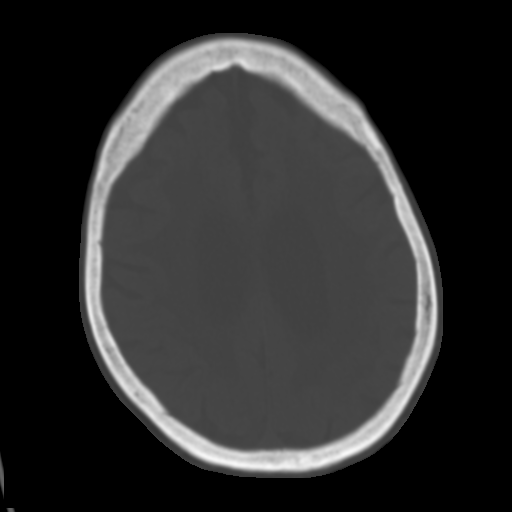
[im 25/33  brain]
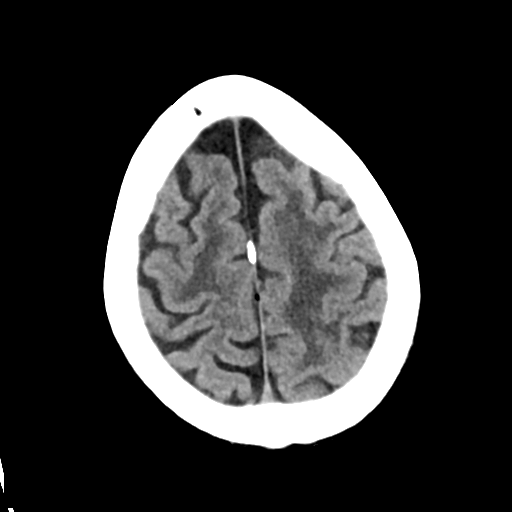
[im 29/33  brain]
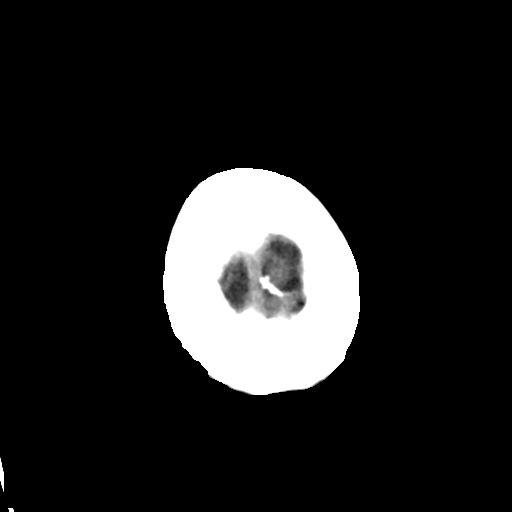

[Series 5: head bone · axial · 0.39mm/px · z∈[-114,-82]mm · 3 of 81 slices shown]
[im 9/81  bone]
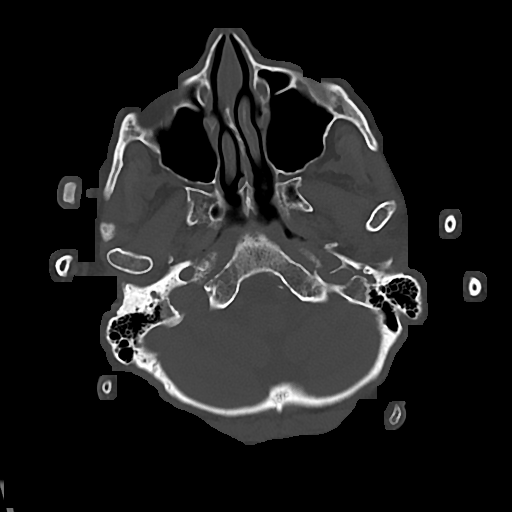
[im 17/81  bone]
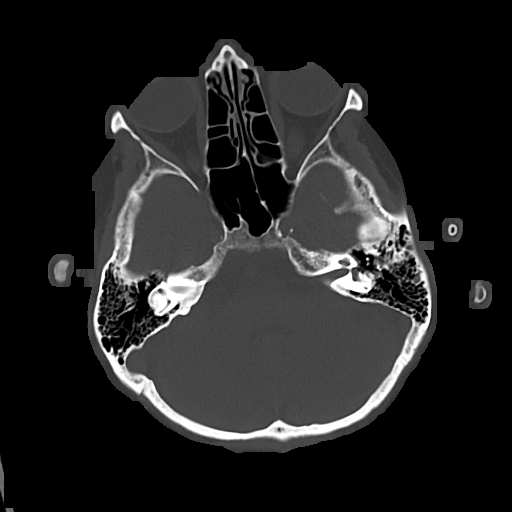
[im 25/81  bone]
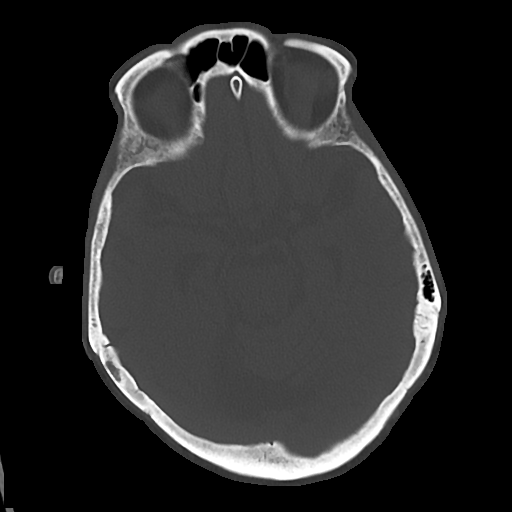

[Series 6: cor soft · coronal · 0.32mm/px · 3 of 64 slices shown]
[im 22/64  brain]
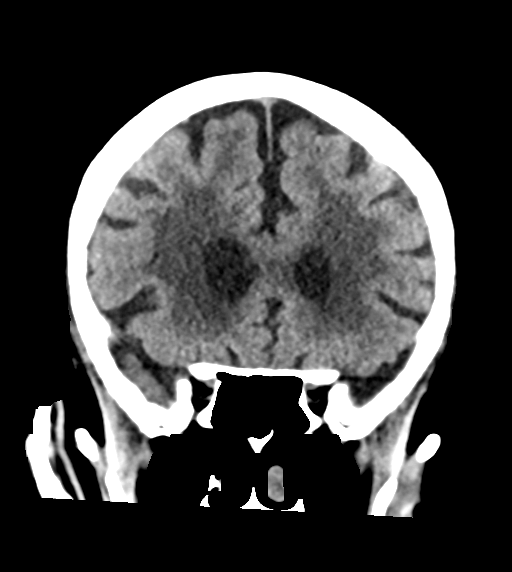
[im 29/64  brain]
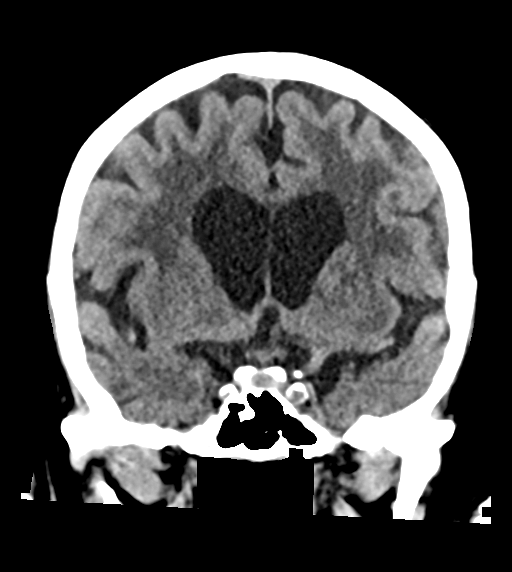
[im 36/64  brain]
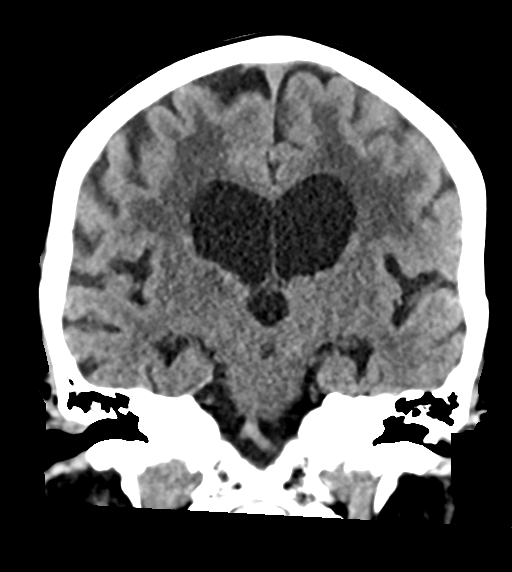

[Series 7: sag soft · sagittal · 0.36mm/px · 3 of 55 slices shown]
[im 19/55  brain]
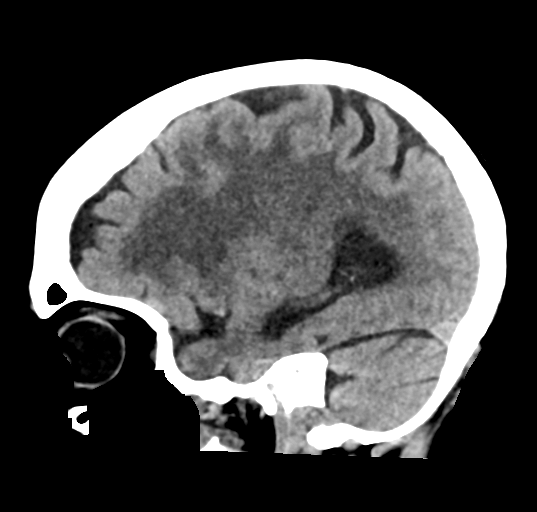
[im 28/55  brain]
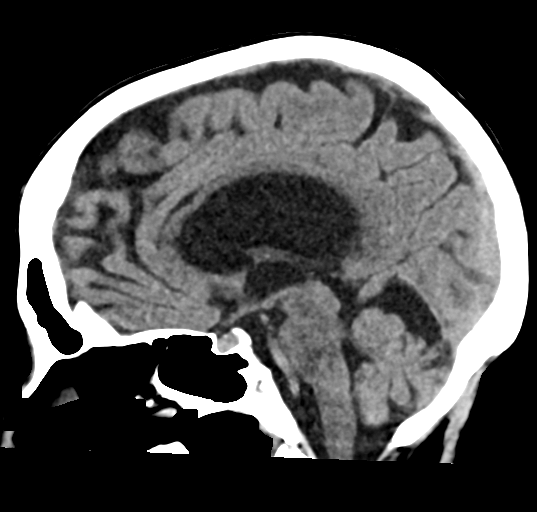
[im 37/55  brain]
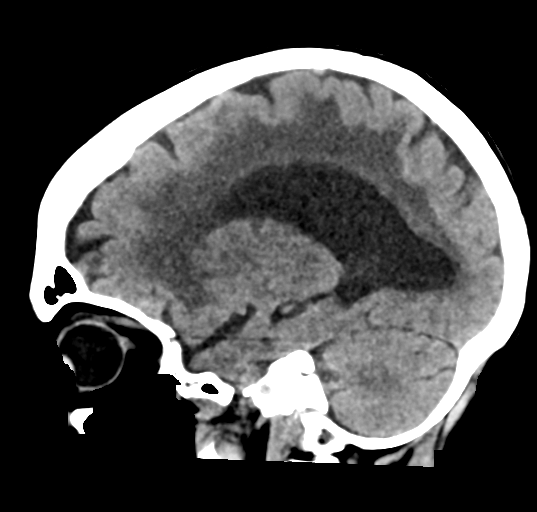

[16 of 47 positions shown; findings below may reference images not displayed]

FINDINGS: Brain: Moderate cortical, deep and cerebellar atrophy.
Panventricular enlargement, somewhat out of proportion to the degree
of atrophy with more ventricular dilation than in January 2016.
Severe changes of small vessel disease of the white matter
diffusely. Physiologic calcifications in the right basal ganglia. No
mass lesion. No midline shift. No acute hemorrhage or hematoma. No
extra-axial fluid collections. No evidence of acute infarction.

Vascular: Severe bilateral carotid siphon and bilateral vertebral
artery atherosclerosis.

Skull: No skull fracture or other focal osseous abnormality
involving the skull.

Sinuses/Orbits: Visualized paranasal sinuses, bilateral mastoid air
cells and bilateral middle ear cavities well-aerated. Visualized
orbits and globes are normal.

Other: None.
IMPRESSION: 1. No acute intracranial abnormality.
2. Stable moderate generalized atrophy and severe chronic
microvascular ischemic changes of the white matter diffusely.
3. Panventriculomegaly, somewhat out of proportion to the degree of
atrophy, with increased ventricular dilation since January 2016.
Query normal pressure hydrocephalus.

## 2017-10-24 DIAGNOSIS — I779 Disorder of arteries and arterioles, unspecified: Secondary | ICD-10-CM | POA: Diagnosis not present

## 2017-10-24 DIAGNOSIS — I10 Essential (primary) hypertension: Secondary | ICD-10-CM | POA: Diagnosis not present

## 2017-10-24 DIAGNOSIS — R410 Disorientation, unspecified: Secondary | ICD-10-CM | POA: Diagnosis not present

## 2017-10-24 DIAGNOSIS — I6523 Occlusion and stenosis of bilateral carotid arteries: Secondary | ICD-10-CM | POA: Diagnosis not present

## 2017-10-24 DIAGNOSIS — R451 Restlessness and agitation: Secondary | ICD-10-CM | POA: Diagnosis not present

## 2017-10-24 DIAGNOSIS — R296 Repeated falls: Secondary | ICD-10-CM | POA: Diagnosis not present

## 2017-10-24 DIAGNOSIS — Z515 Encounter for palliative care: Secondary | ICD-10-CM | POA: Diagnosis not present

## 2017-10-24 DIAGNOSIS — R634 Abnormal weight loss: Secondary | ICD-10-CM | POA: Diagnosis not present

## 2017-10-25 DIAGNOSIS — I10 Essential (primary) hypertension: Secondary | ICD-10-CM | POA: Diagnosis not present

## 2017-10-25 DIAGNOSIS — R634 Abnormal weight loss: Secondary | ICD-10-CM | POA: Diagnosis not present

## 2017-10-25 DIAGNOSIS — R410 Disorientation, unspecified: Secondary | ICD-10-CM | POA: Diagnosis not present

## 2017-10-25 DIAGNOSIS — I779 Disorder of arteries and arterioles, unspecified: Secondary | ICD-10-CM | POA: Diagnosis not present

## 2017-10-25 DIAGNOSIS — R451 Restlessness and agitation: Secondary | ICD-10-CM | POA: Diagnosis not present

## 2017-10-25 DIAGNOSIS — I6523 Occlusion and stenosis of bilateral carotid arteries: Secondary | ICD-10-CM | POA: Diagnosis not present

## 2017-10-25 DIAGNOSIS — R296 Repeated falls: Secondary | ICD-10-CM | POA: Diagnosis not present

## 2017-10-25 DIAGNOSIS — Z515 Encounter for palliative care: Secondary | ICD-10-CM | POA: Diagnosis not present

## 2017-10-26 DIAGNOSIS — I779 Disorder of arteries and arterioles, unspecified: Secondary | ICD-10-CM | POA: Diagnosis not present

## 2017-10-26 DIAGNOSIS — Z515 Encounter for palliative care: Secondary | ICD-10-CM | POA: Diagnosis not present

## 2017-10-26 DIAGNOSIS — I6523 Occlusion and stenosis of bilateral carotid arteries: Secondary | ICD-10-CM | POA: Diagnosis not present

## 2017-10-26 DIAGNOSIS — R634 Abnormal weight loss: Secondary | ICD-10-CM | POA: Diagnosis not present

## 2017-10-26 DIAGNOSIS — I10 Essential (primary) hypertension: Secondary | ICD-10-CM | POA: Diagnosis not present

## 2017-10-26 DIAGNOSIS — R451 Restlessness and agitation: Secondary | ICD-10-CM | POA: Diagnosis not present

## 2017-10-26 DIAGNOSIS — R296 Repeated falls: Secondary | ICD-10-CM | POA: Diagnosis not present

## 2017-10-26 DIAGNOSIS — R410 Disorientation, unspecified: Secondary | ICD-10-CM | POA: Diagnosis not present

## 2017-10-27 DIAGNOSIS — Z515 Encounter for palliative care: Secondary | ICD-10-CM | POA: Diagnosis not present

## 2017-10-27 DIAGNOSIS — I10 Essential (primary) hypertension: Secondary | ICD-10-CM | POA: Diagnosis not present

## 2017-10-27 DIAGNOSIS — R451 Restlessness and agitation: Secondary | ICD-10-CM | POA: Diagnosis not present

## 2017-10-27 DIAGNOSIS — I6523 Occlusion and stenosis of bilateral carotid arteries: Secondary | ICD-10-CM | POA: Diagnosis not present

## 2017-10-27 DIAGNOSIS — I779 Disorder of arteries and arterioles, unspecified: Secondary | ICD-10-CM | POA: Diagnosis not present

## 2017-10-27 DIAGNOSIS — R634 Abnormal weight loss: Secondary | ICD-10-CM | POA: Diagnosis not present

## 2017-10-27 DIAGNOSIS — R296 Repeated falls: Secondary | ICD-10-CM | POA: Diagnosis not present

## 2017-10-27 DIAGNOSIS — R410 Disorientation, unspecified: Secondary | ICD-10-CM | POA: Diagnosis not present

## 2017-10-28 DIAGNOSIS — R410 Disorientation, unspecified: Secondary | ICD-10-CM | POA: Diagnosis not present

## 2017-10-28 DIAGNOSIS — R296 Repeated falls: Secondary | ICD-10-CM | POA: Diagnosis not present

## 2017-10-28 DIAGNOSIS — M25551 Pain in right hip: Secondary | ICD-10-CM | POA: Diagnosis not present

## 2017-10-28 DIAGNOSIS — R634 Abnormal weight loss: Secondary | ICD-10-CM | POA: Diagnosis not present

## 2017-10-28 DIAGNOSIS — R451 Restlessness and agitation: Secondary | ICD-10-CM | POA: Diagnosis not present

## 2017-10-28 DIAGNOSIS — I779 Disorder of arteries and arterioles, unspecified: Secondary | ICD-10-CM | POA: Diagnosis not present

## 2017-10-28 DIAGNOSIS — I10 Essential (primary) hypertension: Secondary | ICD-10-CM | POA: Diagnosis not present

## 2017-10-28 DIAGNOSIS — I6523 Occlusion and stenosis of bilateral carotid arteries: Secondary | ICD-10-CM | POA: Diagnosis not present

## 2017-10-28 DIAGNOSIS — K5900 Constipation, unspecified: Secondary | ICD-10-CM | POA: Diagnosis not present

## 2017-10-28 DIAGNOSIS — Z515 Encounter for palliative care: Secondary | ICD-10-CM | POA: Diagnosis not present

## 2017-10-29 DIAGNOSIS — I6523 Occlusion and stenosis of bilateral carotid arteries: Secondary | ICD-10-CM | POA: Diagnosis not present

## 2017-10-29 DIAGNOSIS — R634 Abnormal weight loss: Secondary | ICD-10-CM | POA: Diagnosis not present

## 2017-10-29 DIAGNOSIS — I10 Essential (primary) hypertension: Secondary | ICD-10-CM | POA: Diagnosis not present

## 2017-10-29 DIAGNOSIS — I779 Disorder of arteries and arterioles, unspecified: Secondary | ICD-10-CM | POA: Diagnosis not present

## 2017-10-29 DIAGNOSIS — R410 Disorientation, unspecified: Secondary | ICD-10-CM | POA: Diagnosis not present

## 2017-10-29 DIAGNOSIS — Z515 Encounter for palliative care: Secondary | ICD-10-CM | POA: Diagnosis not present

## 2017-10-29 DIAGNOSIS — R451 Restlessness and agitation: Secondary | ICD-10-CM | POA: Diagnosis not present

## 2017-10-29 DIAGNOSIS — R296 Repeated falls: Secondary | ICD-10-CM | POA: Diagnosis not present

## 2017-10-30 DIAGNOSIS — R451 Restlessness and agitation: Secondary | ICD-10-CM | POA: Diagnosis not present

## 2017-10-30 DIAGNOSIS — I6523 Occlusion and stenosis of bilateral carotid arteries: Secondary | ICD-10-CM | POA: Diagnosis not present

## 2017-10-30 DIAGNOSIS — R410 Disorientation, unspecified: Secondary | ICD-10-CM | POA: Diagnosis not present

## 2017-10-30 DIAGNOSIS — Z515 Encounter for palliative care: Secondary | ICD-10-CM | POA: Diagnosis not present

## 2017-10-30 DIAGNOSIS — I779 Disorder of arteries and arterioles, unspecified: Secondary | ICD-10-CM | POA: Diagnosis not present

## 2017-10-30 DIAGNOSIS — R634 Abnormal weight loss: Secondary | ICD-10-CM | POA: Diagnosis not present

## 2017-10-30 DIAGNOSIS — R296 Repeated falls: Secondary | ICD-10-CM | POA: Diagnosis not present

## 2017-10-30 DIAGNOSIS — I10 Essential (primary) hypertension: Secondary | ICD-10-CM | POA: Diagnosis not present

## 2017-10-31 DIAGNOSIS — I779 Disorder of arteries and arterioles, unspecified: Secondary | ICD-10-CM | POA: Diagnosis not present

## 2017-10-31 DIAGNOSIS — I6523 Occlusion and stenosis of bilateral carotid arteries: Secondary | ICD-10-CM | POA: Diagnosis not present

## 2017-10-31 DIAGNOSIS — R296 Repeated falls: Secondary | ICD-10-CM | POA: Diagnosis not present

## 2017-10-31 DIAGNOSIS — R634 Abnormal weight loss: Secondary | ICD-10-CM | POA: Diagnosis not present

## 2017-10-31 DIAGNOSIS — R410 Disorientation, unspecified: Secondary | ICD-10-CM | POA: Diagnosis not present

## 2017-10-31 DIAGNOSIS — Z515 Encounter for palliative care: Secondary | ICD-10-CM | POA: Diagnosis not present

## 2017-10-31 DIAGNOSIS — I10 Essential (primary) hypertension: Secondary | ICD-10-CM | POA: Diagnosis not present

## 2017-10-31 DIAGNOSIS — R451 Restlessness and agitation: Secondary | ICD-10-CM | POA: Diagnosis not present

## 2017-11-01 DIAGNOSIS — R296 Repeated falls: Secondary | ICD-10-CM | POA: Diagnosis not present

## 2017-11-01 DIAGNOSIS — R410 Disorientation, unspecified: Secondary | ICD-10-CM | POA: Diagnosis not present

## 2017-11-01 DIAGNOSIS — R451 Restlessness and agitation: Secondary | ICD-10-CM | POA: Diagnosis not present

## 2017-11-01 DIAGNOSIS — I779 Disorder of arteries and arterioles, unspecified: Secondary | ICD-10-CM | POA: Diagnosis not present

## 2017-11-01 DIAGNOSIS — I6523 Occlusion and stenosis of bilateral carotid arteries: Secondary | ICD-10-CM | POA: Diagnosis not present

## 2017-11-01 DIAGNOSIS — Z515 Encounter for palliative care: Secondary | ICD-10-CM | POA: Diagnosis not present

## 2017-11-01 DIAGNOSIS — R634 Abnormal weight loss: Secondary | ICD-10-CM | POA: Diagnosis not present

## 2017-11-01 DIAGNOSIS — I10 Essential (primary) hypertension: Secondary | ICD-10-CM | POA: Diagnosis not present

## 2017-11-02 DIAGNOSIS — R410 Disorientation, unspecified: Secondary | ICD-10-CM | POA: Diagnosis not present

## 2017-11-02 DIAGNOSIS — I6523 Occlusion and stenosis of bilateral carotid arteries: Secondary | ICD-10-CM | POA: Diagnosis not present

## 2017-11-02 DIAGNOSIS — I779 Disorder of arteries and arterioles, unspecified: Secondary | ICD-10-CM | POA: Diagnosis not present

## 2017-11-02 DIAGNOSIS — R451 Restlessness and agitation: Secondary | ICD-10-CM | POA: Diagnosis not present

## 2017-11-02 DIAGNOSIS — Z515 Encounter for palliative care: Secondary | ICD-10-CM | POA: Diagnosis not present

## 2017-11-02 DIAGNOSIS — R296 Repeated falls: Secondary | ICD-10-CM | POA: Diagnosis not present

## 2017-11-02 DIAGNOSIS — R634 Abnormal weight loss: Secondary | ICD-10-CM | POA: Diagnosis not present

## 2017-11-02 DIAGNOSIS — I10 Essential (primary) hypertension: Secondary | ICD-10-CM | POA: Diagnosis not present

## 2017-11-03 DIAGNOSIS — R410 Disorientation, unspecified: Secondary | ICD-10-CM | POA: Diagnosis not present

## 2017-11-03 DIAGNOSIS — Z515 Encounter for palliative care: Secondary | ICD-10-CM | POA: Diagnosis not present

## 2017-11-03 DIAGNOSIS — I6523 Occlusion and stenosis of bilateral carotid arteries: Secondary | ICD-10-CM | POA: Diagnosis not present

## 2017-11-03 DIAGNOSIS — R451 Restlessness and agitation: Secondary | ICD-10-CM | POA: Diagnosis not present

## 2017-11-03 DIAGNOSIS — R634 Abnormal weight loss: Secondary | ICD-10-CM | POA: Diagnosis not present

## 2017-11-03 DIAGNOSIS — R296 Repeated falls: Secondary | ICD-10-CM | POA: Diagnosis not present

## 2017-11-03 DIAGNOSIS — I10 Essential (primary) hypertension: Secondary | ICD-10-CM | POA: Diagnosis not present

## 2017-11-03 DIAGNOSIS — I779 Disorder of arteries and arterioles, unspecified: Secondary | ICD-10-CM | POA: Diagnosis not present

## 2017-11-04 ENCOUNTER — Encounter: Payer: Self-pay | Admitting: Neurology

## 2017-11-04 ENCOUNTER — Ambulatory Visit: Payer: BLUE CROSS/BLUE SHIELD | Admitting: Neurology

## 2017-11-04 VITALS — BP 201/89 | HR 92

## 2017-11-04 DIAGNOSIS — I779 Disorder of arteries and arterioles, unspecified: Secondary | ICD-10-CM | POA: Diagnosis not present

## 2017-11-04 DIAGNOSIS — R410 Disorientation, unspecified: Secondary | ICD-10-CM | POA: Diagnosis not present

## 2017-11-04 DIAGNOSIS — F0151 Vascular dementia with behavioral disturbance: Secondary | ICD-10-CM | POA: Diagnosis not present

## 2017-11-04 DIAGNOSIS — I10 Essential (primary) hypertension: Secondary | ICD-10-CM | POA: Diagnosis not present

## 2017-11-04 DIAGNOSIS — Z515 Encounter for palliative care: Secondary | ICD-10-CM | POA: Diagnosis not present

## 2017-11-04 DIAGNOSIS — I639 Cerebral infarction, unspecified: Secondary | ICD-10-CM | POA: Diagnosis not present

## 2017-11-04 DIAGNOSIS — R296 Repeated falls: Secondary | ICD-10-CM | POA: Diagnosis not present

## 2017-11-04 DIAGNOSIS — R634 Abnormal weight loss: Secondary | ICD-10-CM | POA: Diagnosis not present

## 2017-11-04 DIAGNOSIS — I6523 Occlusion and stenosis of bilateral carotid arteries: Secondary | ICD-10-CM | POA: Diagnosis not present

## 2017-11-04 DIAGNOSIS — R451 Restlessness and agitation: Secondary | ICD-10-CM | POA: Diagnosis not present

## 2017-11-04 DIAGNOSIS — F01518 Vascular dementia, unspecified severity, with other behavioral disturbance: Secondary | ICD-10-CM

## 2017-11-04 MED ORDER — QUETIAPINE FUMARATE 25 MG PO TABS: ORAL_TABLET | ORAL | 11 refills | Status: DC

## 2017-11-04 MED ORDER — QUETIAPINE FUMARATE 25 MG PO TABS: ORAL_TABLET | ORAL | 11 refills | Status: AC

## 2017-11-04 NOTE — Progress Notes (Signed)
PATIENT: Maria Ortega A Vallandingham DOB: Apr 23, 1956   Chief Complaint  Patient presents with  . Dementia  . PCP     HISTORICAL  Maria Ortega is a 61 year old female, accompanied by her husband, and the facility staff, [facility] in refer [facility] Dr. Bethann BerkshireJoseph Zammit for evaluation of dementia, initial evaluation was on November 04 2017.  I have reviewed and summarized the referring note, she has history of hypertension, hyperlipidemia, stroke, strong family history of dementia, mother, maternal uncle suffered severe dementia in their elderly age.  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  [facility] eventually was [facility] since August 2018, she was noted to have worsening agitations, continued had multiple emergency visit despite 1:1 supervision, she fell few times, increased agitations,  Have personally reviewed CT head without contrast October 21 2017, generalized atrophy, diffuse supratentorium small vessel disease.  CT cervical spine showed evidence of mild degenerative changes, there was no evidence of canal or foraminal narrowing.  Laboratory evaluation in October 2018, creatinine 1.1, myoglobin 7.8, it was 9.7 UA in September 2018 showed large leukocytosis, positive nitrite, protein of 30,   Today she was brought in by her husband, her cousin, facility staff, family members, remained agitated throughout interview, not cooperative on examinations, tends to jump out of her chair,  REVIEW OF SYSTEMS: Full 14 system review of systems performed and notable only for as  above  ALLERGIES: Allergies  Allergen Reactions  . Ativan [Lorazepam] Hives    HOME MEDICATIONS: Current Outpatient Medications  Medication Sig Dispense Refill  . atorvastatin (LIPITOR) 10 MG tablet Take 1 tablet by mouth daily.    . busPIRone (BUSPAR) 15 MG tablet Take 7.5 mg by mouth 2 (two) times daily.     . cephALEXin (KEFLEX) 500 MG capsule Take 1 capsule (500 mg total) by mouth 4 (four) times daily. (Patient not taking: Reported on 10/21/2017) 28 capsule 0  . cloNIDine (CATAPRES) 0.2 MG tablet Take 1 tablet by mouth 2 (two) times daily.    Marland Kitchen. dipyridamole-aspirin (AGGRENOX) 200-25 MG 12hr capsule TAKE ONE CAPSULE BY MOUTH TWICE DAILY  2  . FLUZONE QUADRIVALENT 0.5 ML injection TO BE ADMINISTERED BY PHARMACIST FOR IMMUNIZATION  0  . LORazepam (ATIVAN) 0.5 MG tablet Take 0.5 mg by mouth 2 (two) times daily.    . polyethylene glycol powder (GLYCOLAX/MIRALAX) powder Take 1 g by mouth daily.  10  . traZODone (DESYREL) 50 MG tablet Take 50 mg by mouth at bedtime.  10   No current facility-administered medications for this visit.     PAST MEDICAL HISTORY: Past Medical History:  Diagnosis Date  . Dementia   . Dementia   . High cholesterol   . Hypertension   . Protein calorie malnutrition (HCC)   . Stroke [facility](HCC)     PAST SURGICAL HISTORY: No past surgical history on file.  FAMILY HISTORY: No family history on file.  SOCIAL HISTORY:  Social History   Socioeconomic History  . Marital status: Married    Spouse name: Not on file  . Number of children: Not on file  .  Years of education: Not on file  . Highest education level: Not on file  Social Needs  . Financial resource strain: Not on file  . Food insecurity - worry: Not on file  . Food insecurity - inability: Not on file  . Transportation needs - medical: Not on file  . Transportation needs - non-medical: Not on file  Occupational History  . Not on file  Tobacco Use  . Smoking status: Never Smoker  . Smokeless  tobacco: Never Used  Substance and Sexual Activity  . Alcohol use: No  . Drug use: No  . Sexual activity: Not on file  Other Topics Concern  . Not on file  Social History Narrative  . Not on file     PHYSICAL EXAM   There were no vitals filed for this visit.  Not recorded      There is no height or weight on file to calculate BMI.  PHYSICAL EXAMNIATION:  Gen: NAD, conversant, well nourised, obese, well groomed                     Cardiovascular: Regular rate rhythm, no peripheral edema, warm, nontender. Eyes: Conjunctivae clear without exudates or hemorrhage Neck: Supple, no carotid bruits. Pulmonary: Clear to auscultation bilaterally   NEUROLOGICAL EXAM:  MENTAL STATUS: Intelligent speech, not cooperative on examinations, CRANIAL NERVES: CN II: Pupils were round reactive to light CN III, IV, VI: extraocular movement are normal. No ptosis. CN V: Facial sensation is intact to pinprick in all 3 divisions bilaterally. Corneal responses are intact.  CN VII: Face is symmetric with normal eye closure and smile. CN VIII: Hearing is normal to rubbing fingers CN IX, X: Palate elevates symmetrically. Phonation is normal. CN XI: Head turning and shoulder shrug are intact CN XII: Tongue is midline with normal movements and no atrophy.  MOTOR: Agitated, moving all 4 extremities without difficulties   REFLEXES: Hypoactive and symmetric   SENSORY: Withdraw to pain  COORDINATION: Therefore extremity without difficulty,  GAIT/STANCE: Attempts to jump out of chair,  DIAGNOSTIC DATA (LABS, IMAGING, TESTING) - I reviewed patient records, labs, notes, testing and imaging myself where available.   ASSESSMENT AND PLAN  Vessie A Ortega is a 61 y.o. female   Dementia was agitations  That could potentially complicated by UTI, worsening anemia,  Add on seroquel 25 mg in the morning, 75 mg every night   Levert Feinstein, M.D. Ph.D.  [facility] [address],  [address] [place], [place] [address] Ph: [phone] Fax: [phone]  CC: Referring Provider

## 2017-11-05 DIAGNOSIS — R451 Restlessness and agitation: Secondary | ICD-10-CM | POA: Diagnosis not present

## 2017-11-05 DIAGNOSIS — I10 Essential (primary) hypertension: Secondary | ICD-10-CM | POA: Diagnosis not present

## 2017-11-05 DIAGNOSIS — Z515 Encounter for palliative care: Secondary | ICD-10-CM | POA: Diagnosis not present

## 2017-11-05 DIAGNOSIS — I6523 Occlusion and stenosis of bilateral carotid arteries: Secondary | ICD-10-CM | POA: Diagnosis not present

## 2017-11-05 DIAGNOSIS — I779 Disorder of arteries and arterioles, unspecified: Secondary | ICD-10-CM | POA: Diagnosis not present

## 2017-11-05 DIAGNOSIS — R634 Abnormal weight loss: Secondary | ICD-10-CM | POA: Diagnosis not present

## 2017-11-05 DIAGNOSIS — R296 Repeated falls: Secondary | ICD-10-CM | POA: Diagnosis not present

## 2017-11-05 DIAGNOSIS — R410 Disorientation, unspecified: Secondary | ICD-10-CM | POA: Diagnosis not present

## 2017-11-06 DIAGNOSIS — R410 Disorientation, unspecified: Secondary | ICD-10-CM | POA: Diagnosis not present

## 2017-11-06 DIAGNOSIS — R296 Repeated falls: Secondary | ICD-10-CM | POA: Diagnosis not present

## 2017-11-06 DIAGNOSIS — Z515 Encounter for palliative care: Secondary | ICD-10-CM | POA: Diagnosis not present

## 2017-11-06 DIAGNOSIS — R634 Abnormal weight loss: Secondary | ICD-10-CM | POA: Diagnosis not present

## 2017-11-06 DIAGNOSIS — R451 Restlessness and agitation: Secondary | ICD-10-CM | POA: Diagnosis not present

## 2017-11-06 DIAGNOSIS — I10 Essential (primary) hypertension: Secondary | ICD-10-CM | POA: Diagnosis not present

## 2017-11-06 DIAGNOSIS — I779 Disorder of arteries and arterioles, unspecified: Secondary | ICD-10-CM | POA: Diagnosis not present

## 2017-11-06 DIAGNOSIS — I6523 Occlusion and stenosis of bilateral carotid arteries: Secondary | ICD-10-CM | POA: Diagnosis not present

## 2017-11-07 DIAGNOSIS — R634 Abnormal weight loss: Secondary | ICD-10-CM | POA: Diagnosis not present

## 2017-11-07 DIAGNOSIS — I6523 Occlusion and stenosis of bilateral carotid arteries: Secondary | ICD-10-CM | POA: Diagnosis not present

## 2017-11-07 DIAGNOSIS — Z515 Encounter for palliative care: Secondary | ICD-10-CM | POA: Diagnosis not present

## 2017-11-07 DIAGNOSIS — I10 Essential (primary) hypertension: Secondary | ICD-10-CM | POA: Diagnosis not present

## 2017-11-07 DIAGNOSIS — R296 Repeated falls: Secondary | ICD-10-CM | POA: Diagnosis not present

## 2017-11-07 DIAGNOSIS — I779 Disorder of arteries and arterioles, unspecified: Secondary | ICD-10-CM | POA: Diagnosis not present

## 2017-11-07 DIAGNOSIS — R410 Disorientation, unspecified: Secondary | ICD-10-CM | POA: Diagnosis not present

## 2017-11-07 DIAGNOSIS — R451 Restlessness and agitation: Secondary | ICD-10-CM | POA: Diagnosis not present

## 2017-11-08 DIAGNOSIS — Z515 Encounter for palliative care: Secondary | ICD-10-CM | POA: Diagnosis not present

## 2017-11-08 DIAGNOSIS — R410 Disorientation, unspecified: Secondary | ICD-10-CM | POA: Diagnosis not present

## 2017-11-08 DIAGNOSIS — R634 Abnormal weight loss: Secondary | ICD-10-CM | POA: Diagnosis not present

## 2017-11-08 DIAGNOSIS — R296 Repeated falls: Secondary | ICD-10-CM | POA: Diagnosis not present

## 2017-11-08 DIAGNOSIS — I779 Disorder of arteries and arterioles, unspecified: Secondary | ICD-10-CM | POA: Diagnosis not present

## 2017-11-08 DIAGNOSIS — R451 Restlessness and agitation: Secondary | ICD-10-CM | POA: Diagnosis not present

## 2017-11-08 DIAGNOSIS — I10 Essential (primary) hypertension: Secondary | ICD-10-CM | POA: Diagnosis not present

## 2017-11-08 DIAGNOSIS — I6523 Occlusion and stenosis of bilateral carotid arteries: Secondary | ICD-10-CM | POA: Diagnosis not present

## 2017-11-09 DIAGNOSIS — I10 Essential (primary) hypertension: Secondary | ICD-10-CM | POA: Diagnosis not present

## 2017-11-09 DIAGNOSIS — R451 Restlessness and agitation: Secondary | ICD-10-CM | POA: Diagnosis not present

## 2017-11-09 DIAGNOSIS — Z515 Encounter for palliative care: Secondary | ICD-10-CM | POA: Diagnosis not present

## 2017-11-09 DIAGNOSIS — I6523 Occlusion and stenosis of bilateral carotid arteries: Secondary | ICD-10-CM | POA: Diagnosis not present

## 2017-11-09 DIAGNOSIS — R410 Disorientation, unspecified: Secondary | ICD-10-CM | POA: Diagnosis not present

## 2017-11-09 DIAGNOSIS — R296 Repeated falls: Secondary | ICD-10-CM | POA: Diagnosis not present

## 2017-11-09 DIAGNOSIS — I779 Disorder of arteries and arterioles, unspecified: Secondary | ICD-10-CM | POA: Diagnosis not present

## 2017-11-09 DIAGNOSIS — R634 Abnormal weight loss: Secondary | ICD-10-CM | POA: Diagnosis not present

## 2017-11-10 DIAGNOSIS — I6523 Occlusion and stenosis of bilateral carotid arteries: Secondary | ICD-10-CM | POA: Diagnosis not present

## 2017-11-10 DIAGNOSIS — R410 Disorientation, unspecified: Secondary | ICD-10-CM | POA: Diagnosis not present

## 2017-11-10 DIAGNOSIS — R634 Abnormal weight loss: Secondary | ICD-10-CM | POA: Diagnosis not present

## 2017-11-10 DIAGNOSIS — I10 Essential (primary) hypertension: Secondary | ICD-10-CM | POA: Diagnosis not present

## 2017-11-10 DIAGNOSIS — Z515 Encounter for palliative care: Secondary | ICD-10-CM | POA: Diagnosis not present

## 2017-11-10 DIAGNOSIS — R451 Restlessness and agitation: Secondary | ICD-10-CM | POA: Diagnosis not present

## 2017-11-10 DIAGNOSIS — I779 Disorder of arteries and arterioles, unspecified: Secondary | ICD-10-CM | POA: Diagnosis not present

## 2017-11-10 DIAGNOSIS — R296 Repeated falls: Secondary | ICD-10-CM | POA: Diagnosis not present

## 2017-11-11 DIAGNOSIS — R296 Repeated falls: Secondary | ICD-10-CM | POA: Diagnosis not present

## 2017-11-11 DIAGNOSIS — R451 Restlessness and agitation: Secondary | ICD-10-CM | POA: Diagnosis not present

## 2017-11-11 DIAGNOSIS — Z515 Encounter for palliative care: Secondary | ICD-10-CM | POA: Diagnosis not present

## 2017-11-11 DIAGNOSIS — R634 Abnormal weight loss: Secondary | ICD-10-CM | POA: Diagnosis not present

## 2017-11-11 DIAGNOSIS — I10 Essential (primary) hypertension: Secondary | ICD-10-CM | POA: Diagnosis not present

## 2017-11-11 DIAGNOSIS — I779 Disorder of arteries and arterioles, unspecified: Secondary | ICD-10-CM | POA: Diagnosis not present

## 2017-11-11 DIAGNOSIS — I6523 Occlusion and stenosis of bilateral carotid arteries: Secondary | ICD-10-CM | POA: Diagnosis not present

## 2017-11-11 DIAGNOSIS — R410 Disorientation, unspecified: Secondary | ICD-10-CM | POA: Diagnosis not present

## 2017-11-12 DIAGNOSIS — R451 Restlessness and agitation: Secondary | ICD-10-CM | POA: Diagnosis not present

## 2017-11-12 DIAGNOSIS — I10 Essential (primary) hypertension: Secondary | ICD-10-CM | POA: Diagnosis not present

## 2017-11-12 DIAGNOSIS — Z515 Encounter for palliative care: Secondary | ICD-10-CM | POA: Diagnosis not present

## 2017-11-12 DIAGNOSIS — I779 Disorder of arteries and arterioles, unspecified: Secondary | ICD-10-CM | POA: Diagnosis not present

## 2017-11-12 DIAGNOSIS — R296 Repeated falls: Secondary | ICD-10-CM | POA: Diagnosis not present

## 2017-11-12 DIAGNOSIS — R410 Disorientation, unspecified: Secondary | ICD-10-CM | POA: Diagnosis not present

## 2017-11-12 DIAGNOSIS — R634 Abnormal weight loss: Secondary | ICD-10-CM | POA: Diagnosis not present

## 2017-11-12 DIAGNOSIS — I6523 Occlusion and stenosis of bilateral carotid arteries: Secondary | ICD-10-CM | POA: Diagnosis not present

## 2017-11-13 DIAGNOSIS — R451 Restlessness and agitation: Secondary | ICD-10-CM | POA: Diagnosis not present

## 2017-11-13 DIAGNOSIS — I6523 Occlusion and stenosis of bilateral carotid arteries: Secondary | ICD-10-CM | POA: Diagnosis not present

## 2017-11-13 DIAGNOSIS — I779 Disorder of arteries and arterioles, unspecified: Secondary | ICD-10-CM | POA: Diagnosis not present

## 2017-11-13 DIAGNOSIS — I10 Essential (primary) hypertension: Secondary | ICD-10-CM | POA: Diagnosis not present

## 2017-11-13 DIAGNOSIS — Z515 Encounter for palliative care: Secondary | ICD-10-CM | POA: Diagnosis not present

## 2017-11-13 DIAGNOSIS — M6281 Muscle weakness (generalized): Secondary | ICD-10-CM | POA: Diagnosis not present

## 2017-11-13 DIAGNOSIS — R296 Repeated falls: Secondary | ICD-10-CM | POA: Diagnosis not present

## 2017-11-13 DIAGNOSIS — R634 Abnormal weight loss: Secondary | ICD-10-CM | POA: Diagnosis not present

## 2017-11-13 DIAGNOSIS — I639 Cerebral infarction, unspecified: Secondary | ICD-10-CM | POA: Diagnosis not present

## 2017-11-13 DIAGNOSIS — F0391 Unspecified dementia with behavioral disturbance: Secondary | ICD-10-CM | POA: Diagnosis not present

## 2017-11-13 DIAGNOSIS — R410 Disorientation, unspecified: Secondary | ICD-10-CM | POA: Diagnosis not present

## 2017-11-14 DIAGNOSIS — I779 Disorder of arteries and arterioles, unspecified: Secondary | ICD-10-CM | POA: Diagnosis not present

## 2017-11-14 DIAGNOSIS — R296 Repeated falls: Secondary | ICD-10-CM | POA: Diagnosis not present

## 2017-11-14 DIAGNOSIS — R410 Disorientation, unspecified: Secondary | ICD-10-CM | POA: Diagnosis not present

## 2017-11-14 DIAGNOSIS — R634 Abnormal weight loss: Secondary | ICD-10-CM | POA: Diagnosis not present

## 2017-11-14 DIAGNOSIS — I6523 Occlusion and stenosis of bilateral carotid arteries: Secondary | ICD-10-CM | POA: Diagnosis not present

## 2017-11-14 DIAGNOSIS — R451 Restlessness and agitation: Secondary | ICD-10-CM | POA: Diagnosis not present

## 2017-11-14 DIAGNOSIS — Z515 Encounter for palliative care: Secondary | ICD-10-CM | POA: Diagnosis not present

## 2017-11-14 DIAGNOSIS — I10 Essential (primary) hypertension: Secondary | ICD-10-CM | POA: Diagnosis not present

## 2017-11-15 DIAGNOSIS — F0391 Unspecified dementia with behavioral disturbance: Secondary | ICD-10-CM | POA: Diagnosis not present

## 2017-11-15 DIAGNOSIS — I639 Cerebral infarction, unspecified: Secondary | ICD-10-CM | POA: Diagnosis not present

## 2017-11-15 DIAGNOSIS — Z515 Encounter for palliative care: Secondary | ICD-10-CM | POA: Diagnosis not present

## 2017-11-15 DIAGNOSIS — R634 Abnormal weight loss: Secondary | ICD-10-CM | POA: Diagnosis not present

## 2017-11-15 DIAGNOSIS — M6281 Muscle weakness (generalized): Secondary | ICD-10-CM | POA: Diagnosis not present

## 2017-11-15 DIAGNOSIS — R451 Restlessness and agitation: Secondary | ICD-10-CM | POA: Diagnosis not present

## 2017-11-15 DIAGNOSIS — I779 Disorder of arteries and arterioles, unspecified: Secondary | ICD-10-CM | POA: Diagnosis not present

## 2017-11-15 DIAGNOSIS — R410 Disorientation, unspecified: Secondary | ICD-10-CM | POA: Diagnosis not present

## 2017-11-15 DIAGNOSIS — I10 Essential (primary) hypertension: Secondary | ICD-10-CM | POA: Diagnosis not present

## 2017-11-15 DIAGNOSIS — R296 Repeated falls: Secondary | ICD-10-CM | POA: Diagnosis not present

## 2017-11-15 DIAGNOSIS — I6523 Occlusion and stenosis of bilateral carotid arteries: Secondary | ICD-10-CM | POA: Diagnosis not present

## 2017-11-16 DIAGNOSIS — R296 Repeated falls: Secondary | ICD-10-CM | POA: Diagnosis not present

## 2017-11-16 DIAGNOSIS — R451 Restlessness and agitation: Secondary | ICD-10-CM | POA: Diagnosis not present

## 2017-11-16 DIAGNOSIS — R410 Disorientation, unspecified: Secondary | ICD-10-CM | POA: Diagnosis not present

## 2017-11-16 DIAGNOSIS — R634 Abnormal weight loss: Secondary | ICD-10-CM | POA: Diagnosis not present

## 2017-11-16 DIAGNOSIS — I779 Disorder of arteries and arterioles, unspecified: Secondary | ICD-10-CM | POA: Diagnosis not present

## 2017-11-16 DIAGNOSIS — I10 Essential (primary) hypertension: Secondary | ICD-10-CM | POA: Diagnosis not present

## 2017-11-16 DIAGNOSIS — Z515 Encounter for palliative care: Secondary | ICD-10-CM | POA: Diagnosis not present

## 2017-11-16 DIAGNOSIS — I6523 Occlusion and stenosis of bilateral carotid arteries: Secondary | ICD-10-CM | POA: Diagnosis not present

## 2017-11-17 DIAGNOSIS — Z515 Encounter for palliative care: Secondary | ICD-10-CM | POA: Diagnosis not present

## 2017-11-17 DIAGNOSIS — R451 Restlessness and agitation: Secondary | ICD-10-CM | POA: Diagnosis not present

## 2017-11-17 DIAGNOSIS — R296 Repeated falls: Secondary | ICD-10-CM | POA: Diagnosis not present

## 2017-11-17 DIAGNOSIS — R634 Abnormal weight loss: Secondary | ICD-10-CM | POA: Diagnosis not present

## 2017-11-17 DIAGNOSIS — R410 Disorientation, unspecified: Secondary | ICD-10-CM | POA: Diagnosis not present

## 2017-11-17 DIAGNOSIS — I6523 Occlusion and stenosis of bilateral carotid arteries: Secondary | ICD-10-CM | POA: Diagnosis not present

## 2017-11-17 DIAGNOSIS — I779 Disorder of arteries and arterioles, unspecified: Secondary | ICD-10-CM | POA: Diagnosis not present

## 2017-11-17 DIAGNOSIS — I10 Essential (primary) hypertension: Secondary | ICD-10-CM | POA: Diagnosis not present

## 2017-11-18 DIAGNOSIS — R410 Disorientation, unspecified: Secondary | ICD-10-CM | POA: Diagnosis not present

## 2017-11-18 DIAGNOSIS — I779 Disorder of arteries and arterioles, unspecified: Secondary | ICD-10-CM | POA: Diagnosis not present

## 2017-11-18 DIAGNOSIS — R296 Repeated falls: Secondary | ICD-10-CM | POA: Diagnosis not present

## 2017-11-18 DIAGNOSIS — Z515 Encounter for palliative care: Secondary | ICD-10-CM | POA: Diagnosis not present

## 2017-11-18 DIAGNOSIS — R451 Restlessness and agitation: Secondary | ICD-10-CM | POA: Diagnosis not present

## 2017-11-18 DIAGNOSIS — I6523 Occlusion and stenosis of bilateral carotid arteries: Secondary | ICD-10-CM | POA: Diagnosis not present

## 2017-11-18 DIAGNOSIS — F0390 Unspecified dementia without behavioral disturbance: Secondary | ICD-10-CM | POA: Diagnosis not present

## 2017-11-18 DIAGNOSIS — R634 Abnormal weight loss: Secondary | ICD-10-CM | POA: Diagnosis not present

## 2017-11-18 DIAGNOSIS — I10 Essential (primary) hypertension: Secondary | ICD-10-CM | POA: Diagnosis not present

## 2017-11-19 DIAGNOSIS — M6281 Muscle weakness (generalized): Secondary | ICD-10-CM | POA: Diagnosis not present

## 2017-11-19 DIAGNOSIS — I779 Disorder of arteries and arterioles, unspecified: Secondary | ICD-10-CM | POA: Diagnosis not present

## 2017-11-19 DIAGNOSIS — I10 Essential (primary) hypertension: Secondary | ICD-10-CM | POA: Diagnosis not present

## 2017-11-19 DIAGNOSIS — R451 Restlessness and agitation: Secondary | ICD-10-CM | POA: Diagnosis not present

## 2017-11-19 DIAGNOSIS — R296 Repeated falls: Secondary | ICD-10-CM | POA: Diagnosis not present

## 2017-11-19 DIAGNOSIS — R410 Disorientation, unspecified: Secondary | ICD-10-CM | POA: Diagnosis not present

## 2017-11-19 DIAGNOSIS — I6523 Occlusion and stenosis of bilateral carotid arteries: Secondary | ICD-10-CM | POA: Diagnosis not present

## 2017-11-19 DIAGNOSIS — Z515 Encounter for palliative care: Secondary | ICD-10-CM | POA: Diagnosis not present

## 2017-11-19 DIAGNOSIS — F0391 Unspecified dementia with behavioral disturbance: Secondary | ICD-10-CM | POA: Diagnosis not present

## 2017-11-19 DIAGNOSIS — R634 Abnormal weight loss: Secondary | ICD-10-CM | POA: Diagnosis not present

## 2017-11-20 DIAGNOSIS — I6523 Occlusion and stenosis of bilateral carotid arteries: Secondary | ICD-10-CM | POA: Diagnosis not present

## 2017-11-20 DIAGNOSIS — R410 Disorientation, unspecified: Secondary | ICD-10-CM | POA: Diagnosis not present

## 2017-11-20 DIAGNOSIS — Z515 Encounter for palliative care: Secondary | ICD-10-CM | POA: Diagnosis not present

## 2017-11-20 DIAGNOSIS — I779 Disorder of arteries and arterioles, unspecified: Secondary | ICD-10-CM | POA: Diagnosis not present

## 2017-11-20 DIAGNOSIS — R451 Restlessness and agitation: Secondary | ICD-10-CM | POA: Diagnosis not present

## 2017-11-20 DIAGNOSIS — I10 Essential (primary) hypertension: Secondary | ICD-10-CM | POA: Diagnosis not present

## 2017-11-20 DIAGNOSIS — R296 Repeated falls: Secondary | ICD-10-CM | POA: Diagnosis not present

## 2017-11-20 DIAGNOSIS — R634 Abnormal weight loss: Secondary | ICD-10-CM | POA: Diagnosis not present

## 2017-11-21 DIAGNOSIS — R296 Repeated falls: Secondary | ICD-10-CM | POA: Diagnosis not present

## 2017-11-21 DIAGNOSIS — I6523 Occlusion and stenosis of bilateral carotid arteries: Secondary | ICD-10-CM | POA: Diagnosis not present

## 2017-11-21 DIAGNOSIS — I10 Essential (primary) hypertension: Secondary | ICD-10-CM | POA: Diagnosis not present

## 2017-11-21 DIAGNOSIS — I779 Disorder of arteries and arterioles, unspecified: Secondary | ICD-10-CM | POA: Diagnosis not present

## 2017-11-21 DIAGNOSIS — R451 Restlessness and agitation: Secondary | ICD-10-CM | POA: Diagnosis not present

## 2017-11-21 DIAGNOSIS — R410 Disorientation, unspecified: Secondary | ICD-10-CM | POA: Diagnosis not present

## 2017-11-21 DIAGNOSIS — R634 Abnormal weight loss: Secondary | ICD-10-CM | POA: Diagnosis not present

## 2017-11-21 DIAGNOSIS — Z515 Encounter for palliative care: Secondary | ICD-10-CM | POA: Diagnosis not present

## 2017-11-22 DIAGNOSIS — I779 Disorder of arteries and arterioles, unspecified: Secondary | ICD-10-CM | POA: Diagnosis not present

## 2017-11-22 DIAGNOSIS — R451 Restlessness and agitation: Secondary | ICD-10-CM | POA: Diagnosis not present

## 2017-11-22 DIAGNOSIS — I6523 Occlusion and stenosis of bilateral carotid arteries: Secondary | ICD-10-CM | POA: Diagnosis not present

## 2017-11-22 DIAGNOSIS — R296 Repeated falls: Secondary | ICD-10-CM | POA: Diagnosis not present

## 2017-11-22 DIAGNOSIS — R410 Disorientation, unspecified: Secondary | ICD-10-CM | POA: Diagnosis not present

## 2017-11-22 DIAGNOSIS — Z515 Encounter for palliative care: Secondary | ICD-10-CM | POA: Diagnosis not present

## 2017-11-22 DIAGNOSIS — R634 Abnormal weight loss: Secondary | ICD-10-CM | POA: Diagnosis not present

## 2017-11-22 DIAGNOSIS — I10 Essential (primary) hypertension: Secondary | ICD-10-CM | POA: Diagnosis not present

## 2017-11-23 DIAGNOSIS — R296 Repeated falls: Secondary | ICD-10-CM | POA: Diagnosis not present

## 2017-11-23 DIAGNOSIS — I10 Essential (primary) hypertension: Secondary | ICD-10-CM | POA: Diagnosis not present

## 2017-11-23 DIAGNOSIS — I6523 Occlusion and stenosis of bilateral carotid arteries: Secondary | ICD-10-CM | POA: Diagnosis not present

## 2017-11-23 DIAGNOSIS — Z515 Encounter for palliative care: Secondary | ICD-10-CM | POA: Diagnosis not present

## 2017-11-23 DIAGNOSIS — R634 Abnormal weight loss: Secondary | ICD-10-CM | POA: Diagnosis not present

## 2017-11-23 DIAGNOSIS — I779 Disorder of arteries and arterioles, unspecified: Secondary | ICD-10-CM | POA: Diagnosis not present

## 2017-11-23 DIAGNOSIS — R451 Restlessness and agitation: Secondary | ICD-10-CM | POA: Diagnosis not present

## 2017-11-23 DIAGNOSIS — R410 Disorientation, unspecified: Secondary | ICD-10-CM | POA: Diagnosis not present

## 2017-11-24 DIAGNOSIS — I779 Disorder of arteries and arterioles, unspecified: Secondary | ICD-10-CM | POA: Diagnosis not present

## 2017-11-24 DIAGNOSIS — I10 Essential (primary) hypertension: Secondary | ICD-10-CM | POA: Diagnosis not present

## 2017-11-24 DIAGNOSIS — R410 Disorientation, unspecified: Secondary | ICD-10-CM | POA: Diagnosis not present

## 2017-11-24 DIAGNOSIS — R451 Restlessness and agitation: Secondary | ICD-10-CM | POA: Diagnosis not present

## 2017-11-24 DIAGNOSIS — I6523 Occlusion and stenosis of bilateral carotid arteries: Secondary | ICD-10-CM | POA: Diagnosis not present

## 2017-11-24 DIAGNOSIS — Z515 Encounter for palliative care: Secondary | ICD-10-CM | POA: Diagnosis not present

## 2017-11-24 DIAGNOSIS — R296 Repeated falls: Secondary | ICD-10-CM | POA: Diagnosis not present

## 2017-11-24 DIAGNOSIS — R634 Abnormal weight loss: Secondary | ICD-10-CM | POA: Diagnosis not present

## 2017-11-25 DIAGNOSIS — I10 Essential (primary) hypertension: Secondary | ICD-10-CM | POA: Diagnosis not present

## 2017-11-25 DIAGNOSIS — I779 Disorder of arteries and arterioles, unspecified: Secondary | ICD-10-CM | POA: Diagnosis not present

## 2017-11-25 DIAGNOSIS — R296 Repeated falls: Secondary | ICD-10-CM | POA: Diagnosis not present

## 2017-11-25 DIAGNOSIS — R634 Abnormal weight loss: Secondary | ICD-10-CM | POA: Diagnosis not present

## 2017-11-25 DIAGNOSIS — I6523 Occlusion and stenosis of bilateral carotid arteries: Secondary | ICD-10-CM | POA: Diagnosis not present

## 2017-11-25 DIAGNOSIS — R451 Restlessness and agitation: Secondary | ICD-10-CM | POA: Diagnosis not present

## 2017-11-25 DIAGNOSIS — R410 Disorientation, unspecified: Secondary | ICD-10-CM | POA: Diagnosis not present

## 2017-11-25 DIAGNOSIS — Z515 Encounter for palliative care: Secondary | ICD-10-CM | POA: Diagnosis not present

## 2017-11-26 DIAGNOSIS — R451 Restlessness and agitation: Secondary | ICD-10-CM | POA: Diagnosis not present

## 2017-11-26 DIAGNOSIS — R634 Abnormal weight loss: Secondary | ICD-10-CM | POA: Diagnosis not present

## 2017-11-26 DIAGNOSIS — Z515 Encounter for palliative care: Secondary | ICD-10-CM | POA: Diagnosis not present

## 2017-11-26 DIAGNOSIS — I779 Disorder of arteries and arterioles, unspecified: Secondary | ICD-10-CM | POA: Diagnosis not present

## 2017-11-26 DIAGNOSIS — I6523 Occlusion and stenosis of bilateral carotid arteries: Secondary | ICD-10-CM | POA: Diagnosis not present

## 2017-11-26 DIAGNOSIS — R410 Disorientation, unspecified: Secondary | ICD-10-CM | POA: Diagnosis not present

## 2017-11-26 DIAGNOSIS — I10 Essential (primary) hypertension: Secondary | ICD-10-CM | POA: Diagnosis not present

## 2017-11-26 DIAGNOSIS — R296 Repeated falls: Secondary | ICD-10-CM | POA: Diagnosis not present

## 2017-11-27 DIAGNOSIS — R410 Disorientation, unspecified: Secondary | ICD-10-CM | POA: Diagnosis not present

## 2017-11-27 DIAGNOSIS — I779 Disorder of arteries and arterioles, unspecified: Secondary | ICD-10-CM | POA: Diagnosis not present

## 2017-11-27 DIAGNOSIS — R451 Restlessness and agitation: Secondary | ICD-10-CM | POA: Diagnosis not present

## 2017-11-27 DIAGNOSIS — I6523 Occlusion and stenosis of bilateral carotid arteries: Secondary | ICD-10-CM | POA: Diagnosis not present

## 2017-11-27 DIAGNOSIS — R296 Repeated falls: Secondary | ICD-10-CM | POA: Diagnosis not present

## 2017-11-27 DIAGNOSIS — Z515 Encounter for palliative care: Secondary | ICD-10-CM | POA: Diagnosis not present

## 2017-11-27 DIAGNOSIS — R634 Abnormal weight loss: Secondary | ICD-10-CM | POA: Diagnosis not present

## 2017-11-27 DIAGNOSIS — I10 Essential (primary) hypertension: Secondary | ICD-10-CM | POA: Diagnosis not present

## 2017-11-28 DIAGNOSIS — I6523 Occlusion and stenosis of bilateral carotid arteries: Secondary | ICD-10-CM | POA: Diagnosis not present

## 2017-11-28 DIAGNOSIS — I779 Disorder of arteries and arterioles, unspecified: Secondary | ICD-10-CM | POA: Diagnosis not present

## 2017-11-28 DIAGNOSIS — R634 Abnormal weight loss: Secondary | ICD-10-CM | POA: Diagnosis not present

## 2017-11-28 DIAGNOSIS — R410 Disorientation, unspecified: Secondary | ICD-10-CM | POA: Diagnosis not present

## 2017-11-28 DIAGNOSIS — Z515 Encounter for palliative care: Secondary | ICD-10-CM | POA: Diagnosis not present

## 2017-11-28 DIAGNOSIS — I10 Essential (primary) hypertension: Secondary | ICD-10-CM | POA: Diagnosis not present

## 2017-11-28 DIAGNOSIS — R296 Repeated falls: Secondary | ICD-10-CM | POA: Diagnosis not present

## 2017-11-28 DIAGNOSIS — R451 Restlessness and agitation: Secondary | ICD-10-CM | POA: Diagnosis not present

## 2017-11-29 DIAGNOSIS — Z515 Encounter for palliative care: Secondary | ICD-10-CM | POA: Diagnosis not present

## 2017-11-29 DIAGNOSIS — R296 Repeated falls: Secondary | ICD-10-CM | POA: Diagnosis not present

## 2017-11-29 DIAGNOSIS — I779 Disorder of arteries and arterioles, unspecified: Secondary | ICD-10-CM | POA: Diagnosis not present

## 2017-11-29 DIAGNOSIS — R410 Disorientation, unspecified: Secondary | ICD-10-CM | POA: Diagnosis not present

## 2017-11-29 DIAGNOSIS — R634 Abnormal weight loss: Secondary | ICD-10-CM | POA: Diagnosis not present

## 2017-11-29 DIAGNOSIS — R451 Restlessness and agitation: Secondary | ICD-10-CM | POA: Diagnosis not present

## 2017-11-29 DIAGNOSIS — I10 Essential (primary) hypertension: Secondary | ICD-10-CM | POA: Diagnosis not present

## 2017-11-29 DIAGNOSIS — I6523 Occlusion and stenosis of bilateral carotid arteries: Secondary | ICD-10-CM | POA: Diagnosis not present

## 2017-11-30 DIAGNOSIS — R451 Restlessness and agitation: Secondary | ICD-10-CM | POA: Diagnosis not present

## 2017-11-30 DIAGNOSIS — I779 Disorder of arteries and arterioles, unspecified: Secondary | ICD-10-CM | POA: Diagnosis not present

## 2017-11-30 DIAGNOSIS — I10 Essential (primary) hypertension: Secondary | ICD-10-CM | POA: Diagnosis not present

## 2017-11-30 DIAGNOSIS — Z515 Encounter for palliative care: Secondary | ICD-10-CM | POA: Diagnosis not present

## 2017-11-30 DIAGNOSIS — R296 Repeated falls: Secondary | ICD-10-CM | POA: Diagnosis not present

## 2017-11-30 DIAGNOSIS — R410 Disorientation, unspecified: Secondary | ICD-10-CM | POA: Diagnosis not present

## 2017-11-30 DIAGNOSIS — R634 Abnormal weight loss: Secondary | ICD-10-CM | POA: Diagnosis not present

## 2017-11-30 DIAGNOSIS — I6523 Occlusion and stenosis of bilateral carotid arteries: Secondary | ICD-10-CM | POA: Diagnosis not present

## 2017-12-01 DIAGNOSIS — R296 Repeated falls: Secondary | ICD-10-CM | POA: Diagnosis not present

## 2017-12-01 DIAGNOSIS — I779 Disorder of arteries and arterioles, unspecified: Secondary | ICD-10-CM | POA: Diagnosis not present

## 2017-12-01 DIAGNOSIS — R410 Disorientation, unspecified: Secondary | ICD-10-CM | POA: Diagnosis not present

## 2017-12-01 DIAGNOSIS — Z515 Encounter for palliative care: Secondary | ICD-10-CM | POA: Diagnosis not present

## 2017-12-01 DIAGNOSIS — I6523 Occlusion and stenosis of bilateral carotid arteries: Secondary | ICD-10-CM | POA: Diagnosis not present

## 2017-12-01 DIAGNOSIS — R634 Abnormal weight loss: Secondary | ICD-10-CM | POA: Diagnosis not present

## 2017-12-01 DIAGNOSIS — R451 Restlessness and agitation: Secondary | ICD-10-CM | POA: Diagnosis not present

## 2017-12-01 DIAGNOSIS — I10 Essential (primary) hypertension: Secondary | ICD-10-CM | POA: Diagnosis not present

## 2017-12-02 DIAGNOSIS — I6523 Occlusion and stenosis of bilateral carotid arteries: Secondary | ICD-10-CM | POA: Diagnosis not present

## 2017-12-02 DIAGNOSIS — R451 Restlessness and agitation: Secondary | ICD-10-CM | POA: Diagnosis not present

## 2017-12-02 DIAGNOSIS — I10 Essential (primary) hypertension: Secondary | ICD-10-CM | POA: Diagnosis not present

## 2017-12-02 DIAGNOSIS — Z515 Encounter for palliative care: Secondary | ICD-10-CM | POA: Diagnosis not present

## 2017-12-02 DIAGNOSIS — R296 Repeated falls: Secondary | ICD-10-CM | POA: Diagnosis not present

## 2017-12-02 DIAGNOSIS — R634 Abnormal weight loss: Secondary | ICD-10-CM | POA: Diagnosis not present

## 2017-12-02 DIAGNOSIS — R410 Disorientation, unspecified: Secondary | ICD-10-CM | POA: Diagnosis not present

## 2017-12-02 DIAGNOSIS — I779 Disorder of arteries and arterioles, unspecified: Secondary | ICD-10-CM | POA: Diagnosis not present

## 2017-12-03 DIAGNOSIS — I6523 Occlusion and stenosis of bilateral carotid arteries: Secondary | ICD-10-CM | POA: Diagnosis not present

## 2017-12-03 DIAGNOSIS — R410 Disorientation, unspecified: Secondary | ICD-10-CM | POA: Diagnosis not present

## 2017-12-03 DIAGNOSIS — Z515 Encounter for palliative care: Secondary | ICD-10-CM | POA: Diagnosis not present

## 2017-12-03 DIAGNOSIS — R634 Abnormal weight loss: Secondary | ICD-10-CM | POA: Diagnosis not present

## 2017-12-03 DIAGNOSIS — I10 Essential (primary) hypertension: Secondary | ICD-10-CM | POA: Diagnosis not present

## 2017-12-03 DIAGNOSIS — I779 Disorder of arteries and arterioles, unspecified: Secondary | ICD-10-CM | POA: Diagnosis not present

## 2017-12-03 DIAGNOSIS — R451 Restlessness and agitation: Secondary | ICD-10-CM | POA: Diagnosis not present

## 2017-12-03 DIAGNOSIS — R296 Repeated falls: Secondary | ICD-10-CM | POA: Diagnosis not present

## 2017-12-04 DIAGNOSIS — R634 Abnormal weight loss: Secondary | ICD-10-CM | POA: Diagnosis not present

## 2017-12-04 DIAGNOSIS — R296 Repeated falls: Secondary | ICD-10-CM | POA: Diagnosis not present

## 2017-12-04 DIAGNOSIS — F0391 Unspecified dementia with behavioral disturbance: Secondary | ICD-10-CM | POA: Diagnosis not present

## 2017-12-04 DIAGNOSIS — I779 Disorder of arteries and arterioles, unspecified: Secondary | ICD-10-CM | POA: Diagnosis not present

## 2017-12-04 DIAGNOSIS — I6523 Occlusion and stenosis of bilateral carotid arteries: Secondary | ICD-10-CM | POA: Diagnosis not present

## 2017-12-04 DIAGNOSIS — R21 Rash and other nonspecific skin eruption: Secondary | ICD-10-CM | POA: Diagnosis not present

## 2017-12-04 DIAGNOSIS — R451 Restlessness and agitation: Secondary | ICD-10-CM | POA: Diagnosis not present

## 2017-12-04 DIAGNOSIS — K5900 Constipation, unspecified: Secondary | ICD-10-CM | POA: Diagnosis not present

## 2017-12-04 DIAGNOSIS — F0390 Unspecified dementia without behavioral disturbance: Secondary | ICD-10-CM | POA: Diagnosis not present

## 2017-12-04 DIAGNOSIS — F419 Anxiety disorder, unspecified: Secondary | ICD-10-CM | POA: Diagnosis not present

## 2017-12-04 DIAGNOSIS — Z23 Encounter for immunization: Secondary | ICD-10-CM | POA: Diagnosis not present

## 2017-12-04 DIAGNOSIS — G479 Sleep disorder, unspecified: Secondary | ICD-10-CM | POA: Diagnosis not present

## 2017-12-04 DIAGNOSIS — E785 Hyperlipidemia, unspecified: Secondary | ICD-10-CM | POA: Diagnosis not present

## 2017-12-04 DIAGNOSIS — R636 Underweight: Secondary | ICD-10-CM | POA: Diagnosis not present

## 2017-12-04 DIAGNOSIS — Z515 Encounter for palliative care: Secondary | ICD-10-CM | POA: Diagnosis not present

## 2017-12-04 DIAGNOSIS — I10 Essential (primary) hypertension: Secondary | ICD-10-CM | POA: Diagnosis not present

## 2017-12-04 DIAGNOSIS — R52 Pain, unspecified: Secondary | ICD-10-CM | POA: Diagnosis not present

## 2017-12-04 DIAGNOSIS — R410 Disorientation, unspecified: Secondary | ICD-10-CM | POA: Diagnosis not present

## 2017-12-05 DIAGNOSIS — Z515 Encounter for palliative care: Secondary | ICD-10-CM | POA: Diagnosis not present

## 2017-12-05 DIAGNOSIS — R634 Abnormal weight loss: Secondary | ICD-10-CM | POA: Diagnosis not present

## 2017-12-05 DIAGNOSIS — I10 Essential (primary) hypertension: Secondary | ICD-10-CM | POA: Diagnosis not present

## 2017-12-05 DIAGNOSIS — R451 Restlessness and agitation: Secondary | ICD-10-CM | POA: Diagnosis not present

## 2017-12-05 DIAGNOSIS — R410 Disorientation, unspecified: Secondary | ICD-10-CM | POA: Diagnosis not present

## 2017-12-05 DIAGNOSIS — I779 Disorder of arteries and arterioles, unspecified: Secondary | ICD-10-CM | POA: Diagnosis not present

## 2017-12-05 DIAGNOSIS — I6523 Occlusion and stenosis of bilateral carotid arteries: Secondary | ICD-10-CM | POA: Diagnosis not present

## 2017-12-05 DIAGNOSIS — R296 Repeated falls: Secondary | ICD-10-CM | POA: Diagnosis not present

## 2017-12-06 DIAGNOSIS — I779 Disorder of arteries and arterioles, unspecified: Secondary | ICD-10-CM | POA: Diagnosis not present

## 2017-12-06 DIAGNOSIS — I6523 Occlusion and stenosis of bilateral carotid arteries: Secondary | ICD-10-CM | POA: Diagnosis not present

## 2017-12-06 DIAGNOSIS — R296 Repeated falls: Secondary | ICD-10-CM | POA: Diagnosis not present

## 2017-12-06 DIAGNOSIS — Z515 Encounter for palliative care: Secondary | ICD-10-CM | POA: Diagnosis not present

## 2017-12-06 DIAGNOSIS — R410 Disorientation, unspecified: Secondary | ICD-10-CM | POA: Diagnosis not present

## 2017-12-06 DIAGNOSIS — R634 Abnormal weight loss: Secondary | ICD-10-CM | POA: Diagnosis not present

## 2017-12-06 DIAGNOSIS — I10 Essential (primary) hypertension: Secondary | ICD-10-CM | POA: Diagnosis not present

## 2017-12-06 DIAGNOSIS — R451 Restlessness and agitation: Secondary | ICD-10-CM | POA: Diagnosis not present

## 2017-12-07 DIAGNOSIS — Z515 Encounter for palliative care: Secondary | ICD-10-CM | POA: Diagnosis not present

## 2017-12-07 DIAGNOSIS — I779 Disorder of arteries and arterioles, unspecified: Secondary | ICD-10-CM | POA: Diagnosis not present

## 2017-12-07 DIAGNOSIS — R296 Repeated falls: Secondary | ICD-10-CM | POA: Diagnosis not present

## 2017-12-07 DIAGNOSIS — I10 Essential (primary) hypertension: Secondary | ICD-10-CM | POA: Diagnosis not present

## 2017-12-07 DIAGNOSIS — R410 Disorientation, unspecified: Secondary | ICD-10-CM | POA: Diagnosis not present

## 2017-12-07 DIAGNOSIS — R634 Abnormal weight loss: Secondary | ICD-10-CM | POA: Diagnosis not present

## 2017-12-07 DIAGNOSIS — R451 Restlessness and agitation: Secondary | ICD-10-CM | POA: Diagnosis not present

## 2017-12-07 DIAGNOSIS — I6523 Occlusion and stenosis of bilateral carotid arteries: Secondary | ICD-10-CM | POA: Diagnosis not present

## 2017-12-08 DIAGNOSIS — Z515 Encounter for palliative care: Secondary | ICD-10-CM | POA: Diagnosis not present

## 2017-12-08 DIAGNOSIS — I10 Essential (primary) hypertension: Secondary | ICD-10-CM | POA: Diagnosis not present

## 2017-12-08 DIAGNOSIS — I779 Disorder of arteries and arterioles, unspecified: Secondary | ICD-10-CM | POA: Diagnosis not present

## 2017-12-08 DIAGNOSIS — R451 Restlessness and agitation: Secondary | ICD-10-CM | POA: Diagnosis not present

## 2017-12-08 DIAGNOSIS — R634 Abnormal weight loss: Secondary | ICD-10-CM | POA: Diagnosis not present

## 2017-12-08 DIAGNOSIS — I6523 Occlusion and stenosis of bilateral carotid arteries: Secondary | ICD-10-CM | POA: Diagnosis not present

## 2017-12-08 DIAGNOSIS — R410 Disorientation, unspecified: Secondary | ICD-10-CM | POA: Diagnosis not present

## 2017-12-08 DIAGNOSIS — R296 Repeated falls: Secondary | ICD-10-CM | POA: Diagnosis not present

## 2017-12-09 DIAGNOSIS — R410 Disorientation, unspecified: Secondary | ICD-10-CM | POA: Diagnosis not present

## 2017-12-09 DIAGNOSIS — R296 Repeated falls: Secondary | ICD-10-CM | POA: Diagnosis not present

## 2017-12-09 DIAGNOSIS — I6523 Occlusion and stenosis of bilateral carotid arteries: Secondary | ICD-10-CM | POA: Diagnosis not present

## 2017-12-09 DIAGNOSIS — R451 Restlessness and agitation: Secondary | ICD-10-CM | POA: Diagnosis not present

## 2017-12-09 DIAGNOSIS — I779 Disorder of arteries and arterioles, unspecified: Secondary | ICD-10-CM | POA: Diagnosis not present

## 2017-12-09 DIAGNOSIS — I10 Essential (primary) hypertension: Secondary | ICD-10-CM | POA: Diagnosis not present

## 2017-12-09 DIAGNOSIS — R634 Abnormal weight loss: Secondary | ICD-10-CM | POA: Diagnosis not present

## 2017-12-09 DIAGNOSIS — Z515 Encounter for palliative care: Secondary | ICD-10-CM | POA: Diagnosis not present

## 2017-12-10 DIAGNOSIS — R296 Repeated falls: Secondary | ICD-10-CM | POA: Diagnosis not present

## 2017-12-10 DIAGNOSIS — Z515 Encounter for palliative care: Secondary | ICD-10-CM | POA: Diagnosis not present

## 2017-12-10 DIAGNOSIS — R451 Restlessness and agitation: Secondary | ICD-10-CM | POA: Diagnosis not present

## 2017-12-10 DIAGNOSIS — I10 Essential (primary) hypertension: Secondary | ICD-10-CM | POA: Diagnosis not present

## 2017-12-10 DIAGNOSIS — I779 Disorder of arteries and arterioles, unspecified: Secondary | ICD-10-CM | POA: Diagnosis not present

## 2017-12-10 DIAGNOSIS — I6523 Occlusion and stenosis of bilateral carotid arteries: Secondary | ICD-10-CM | POA: Diagnosis not present

## 2017-12-10 DIAGNOSIS — R410 Disorientation, unspecified: Secondary | ICD-10-CM | POA: Diagnosis not present

## 2017-12-10 DIAGNOSIS — R634 Abnormal weight loss: Secondary | ICD-10-CM | POA: Diagnosis not present

## 2017-12-11 DIAGNOSIS — R451 Restlessness and agitation: Secondary | ICD-10-CM | POA: Diagnosis not present

## 2017-12-11 DIAGNOSIS — I779 Disorder of arteries and arterioles, unspecified: Secondary | ICD-10-CM | POA: Diagnosis not present

## 2017-12-11 DIAGNOSIS — R634 Abnormal weight loss: Secondary | ICD-10-CM | POA: Diagnosis not present

## 2017-12-11 DIAGNOSIS — I10 Essential (primary) hypertension: Secondary | ICD-10-CM | POA: Diagnosis not present

## 2017-12-11 DIAGNOSIS — I6523 Occlusion and stenosis of bilateral carotid arteries: Secondary | ICD-10-CM | POA: Diagnosis not present

## 2017-12-11 DIAGNOSIS — R296 Repeated falls: Secondary | ICD-10-CM | POA: Diagnosis not present

## 2017-12-11 DIAGNOSIS — R410 Disorientation, unspecified: Secondary | ICD-10-CM | POA: Diagnosis not present

## 2017-12-11 DIAGNOSIS — Z515 Encounter for palliative care: Secondary | ICD-10-CM | POA: Diagnosis not present

## 2017-12-12 DIAGNOSIS — R634 Abnormal weight loss: Secondary | ICD-10-CM | POA: Diagnosis not present

## 2017-12-12 DIAGNOSIS — I6523 Occlusion and stenosis of bilateral carotid arteries: Secondary | ICD-10-CM | POA: Diagnosis not present

## 2017-12-12 DIAGNOSIS — Z515 Encounter for palliative care: Secondary | ICD-10-CM | POA: Diagnosis not present

## 2017-12-12 DIAGNOSIS — R296 Repeated falls: Secondary | ICD-10-CM | POA: Diagnosis not present

## 2017-12-12 DIAGNOSIS — I10 Essential (primary) hypertension: Secondary | ICD-10-CM | POA: Diagnosis not present

## 2017-12-12 DIAGNOSIS — I779 Disorder of arteries and arterioles, unspecified: Secondary | ICD-10-CM | POA: Diagnosis not present

## 2017-12-12 DIAGNOSIS — R451 Restlessness and agitation: Secondary | ICD-10-CM | POA: Diagnosis not present

## 2017-12-12 DIAGNOSIS — R410 Disorientation, unspecified: Secondary | ICD-10-CM | POA: Diagnosis not present

## 2017-12-13 DIAGNOSIS — R451 Restlessness and agitation: Secondary | ICD-10-CM | POA: Diagnosis not present

## 2017-12-13 DIAGNOSIS — I10 Essential (primary) hypertension: Secondary | ICD-10-CM | POA: Diagnosis not present

## 2017-12-13 DIAGNOSIS — R296 Repeated falls: Secondary | ICD-10-CM | POA: Diagnosis not present

## 2017-12-13 DIAGNOSIS — Z515 Encounter for palliative care: Secondary | ICD-10-CM | POA: Diagnosis not present

## 2017-12-13 DIAGNOSIS — R410 Disorientation, unspecified: Secondary | ICD-10-CM | POA: Diagnosis not present

## 2017-12-13 DIAGNOSIS — R634 Abnormal weight loss: Secondary | ICD-10-CM | POA: Diagnosis not present

## 2017-12-13 DIAGNOSIS — I6523 Occlusion and stenosis of bilateral carotid arteries: Secondary | ICD-10-CM | POA: Diagnosis not present

## 2017-12-13 DIAGNOSIS — I779 Disorder of arteries and arterioles, unspecified: Secondary | ICD-10-CM | POA: Diagnosis not present

## 2017-12-14 DIAGNOSIS — R451 Restlessness and agitation: Secondary | ICD-10-CM | POA: Diagnosis not present

## 2017-12-14 DIAGNOSIS — Z515 Encounter for palliative care: Secondary | ICD-10-CM | POA: Diagnosis not present

## 2017-12-14 DIAGNOSIS — I779 Disorder of arteries and arterioles, unspecified: Secondary | ICD-10-CM | POA: Diagnosis not present

## 2017-12-14 DIAGNOSIS — R410 Disorientation, unspecified: Secondary | ICD-10-CM | POA: Diagnosis not present

## 2017-12-14 DIAGNOSIS — I6523 Occlusion and stenosis of bilateral carotid arteries: Secondary | ICD-10-CM | POA: Diagnosis not present

## 2017-12-14 DIAGNOSIS — R296 Repeated falls: Secondary | ICD-10-CM | POA: Diagnosis not present

## 2017-12-14 DIAGNOSIS — R634 Abnormal weight loss: Secondary | ICD-10-CM | POA: Diagnosis not present

## 2017-12-14 DIAGNOSIS — I10 Essential (primary) hypertension: Secondary | ICD-10-CM | POA: Diagnosis not present

## 2017-12-15 DIAGNOSIS — I10 Essential (primary) hypertension: Secondary | ICD-10-CM | POA: Diagnosis not present

## 2017-12-15 DIAGNOSIS — R296 Repeated falls: Secondary | ICD-10-CM | POA: Diagnosis not present

## 2017-12-15 DIAGNOSIS — I6523 Occlusion and stenosis of bilateral carotid arteries: Secondary | ICD-10-CM | POA: Diagnosis not present

## 2017-12-15 DIAGNOSIS — I779 Disorder of arteries and arterioles, unspecified: Secondary | ICD-10-CM | POA: Diagnosis not present

## 2017-12-15 DIAGNOSIS — R451 Restlessness and agitation: Secondary | ICD-10-CM | POA: Diagnosis not present

## 2017-12-15 DIAGNOSIS — R634 Abnormal weight loss: Secondary | ICD-10-CM | POA: Diagnosis not present

## 2017-12-15 DIAGNOSIS — R410 Disorientation, unspecified: Secondary | ICD-10-CM | POA: Diagnosis not present

## 2017-12-15 DIAGNOSIS — Z515 Encounter for palliative care: Secondary | ICD-10-CM | POA: Diagnosis not present

## 2017-12-16 DIAGNOSIS — I10 Essential (primary) hypertension: Secondary | ICD-10-CM | POA: Diagnosis not present

## 2017-12-16 DIAGNOSIS — R410 Disorientation, unspecified: Secondary | ICD-10-CM | POA: Diagnosis not present

## 2017-12-16 DIAGNOSIS — I779 Disorder of arteries and arterioles, unspecified: Secondary | ICD-10-CM | POA: Diagnosis not present

## 2017-12-16 DIAGNOSIS — R634 Abnormal weight loss: Secondary | ICD-10-CM | POA: Diagnosis not present

## 2017-12-16 DIAGNOSIS — R296 Repeated falls: Secondary | ICD-10-CM | POA: Diagnosis not present

## 2017-12-16 DIAGNOSIS — I6523 Occlusion and stenosis of bilateral carotid arteries: Secondary | ICD-10-CM | POA: Diagnosis not present

## 2017-12-16 DIAGNOSIS — Z515 Encounter for palliative care: Secondary | ICD-10-CM | POA: Diagnosis not present

## 2017-12-16 DIAGNOSIS — R451 Restlessness and agitation: Secondary | ICD-10-CM | POA: Diagnosis not present

## 2017-12-17 DIAGNOSIS — R21 Rash and other nonspecific skin eruption: Secondary | ICD-10-CM | POA: Diagnosis not present

## 2017-12-17 DIAGNOSIS — I6523 Occlusion and stenosis of bilateral carotid arteries: Secondary | ICD-10-CM | POA: Diagnosis not present

## 2017-12-17 DIAGNOSIS — R296 Repeated falls: Secondary | ICD-10-CM | POA: Diagnosis not present

## 2017-12-17 DIAGNOSIS — I10 Essential (primary) hypertension: Secondary | ICD-10-CM | POA: Diagnosis not present

## 2017-12-17 DIAGNOSIS — R634 Abnormal weight loss: Secondary | ICD-10-CM | POA: Diagnosis not present

## 2017-12-17 DIAGNOSIS — R410 Disorientation, unspecified: Secondary | ICD-10-CM | POA: Diagnosis not present

## 2017-12-17 DIAGNOSIS — Z515 Encounter for palliative care: Secondary | ICD-10-CM | POA: Diagnosis not present

## 2017-12-17 DIAGNOSIS — R451 Restlessness and agitation: Secondary | ICD-10-CM | POA: Diagnosis not present

## 2017-12-17 DIAGNOSIS — I779 Disorder of arteries and arterioles, unspecified: Secondary | ICD-10-CM | POA: Diagnosis not present

## 2017-12-18 DIAGNOSIS — R634 Abnormal weight loss: Secondary | ICD-10-CM | POA: Diagnosis not present

## 2017-12-18 DIAGNOSIS — I10 Essential (primary) hypertension: Secondary | ICD-10-CM | POA: Diagnosis not present

## 2017-12-18 DIAGNOSIS — R410 Disorientation, unspecified: Secondary | ICD-10-CM | POA: Diagnosis not present

## 2017-12-18 DIAGNOSIS — Z515 Encounter for palliative care: Secondary | ICD-10-CM | POA: Diagnosis not present

## 2017-12-18 DIAGNOSIS — R296 Repeated falls: Secondary | ICD-10-CM | POA: Diagnosis not present

## 2017-12-18 DIAGNOSIS — I779 Disorder of arteries and arterioles, unspecified: Secondary | ICD-10-CM | POA: Diagnosis not present

## 2017-12-18 DIAGNOSIS — G894 Chronic pain syndrome: Secondary | ICD-10-CM | POA: Diagnosis not present

## 2017-12-18 DIAGNOSIS — R451 Restlessness and agitation: Secondary | ICD-10-CM | POA: Diagnosis not present

## 2017-12-18 DIAGNOSIS — I6523 Occlusion and stenosis of bilateral carotid arteries: Secondary | ICD-10-CM | POA: Diagnosis not present

## 2017-12-19 DIAGNOSIS — R634 Abnormal weight loss: Secondary | ICD-10-CM | POA: Diagnosis not present

## 2017-12-19 DIAGNOSIS — I10 Essential (primary) hypertension: Secondary | ICD-10-CM | POA: Diagnosis not present

## 2017-12-19 DIAGNOSIS — I779 Disorder of arteries and arterioles, unspecified: Secondary | ICD-10-CM | POA: Diagnosis not present

## 2017-12-19 DIAGNOSIS — I6523 Occlusion and stenosis of bilateral carotid arteries: Secondary | ICD-10-CM | POA: Diagnosis not present

## 2017-12-19 DIAGNOSIS — Z515 Encounter for palliative care: Secondary | ICD-10-CM | POA: Diagnosis not present

## 2017-12-19 DIAGNOSIS — R296 Repeated falls: Secondary | ICD-10-CM | POA: Diagnosis not present

## 2017-12-19 DIAGNOSIS — R410 Disorientation, unspecified: Secondary | ICD-10-CM | POA: Diagnosis not present

## 2017-12-19 DIAGNOSIS — R451 Restlessness and agitation: Secondary | ICD-10-CM | POA: Diagnosis not present

## 2017-12-20 DIAGNOSIS — I779 Disorder of arteries and arterioles, unspecified: Secondary | ICD-10-CM | POA: Diagnosis not present

## 2017-12-20 DIAGNOSIS — Z515 Encounter for palliative care: Secondary | ICD-10-CM | POA: Diagnosis not present

## 2017-12-20 DIAGNOSIS — R296 Repeated falls: Secondary | ICD-10-CM | POA: Diagnosis not present

## 2017-12-20 DIAGNOSIS — I10 Essential (primary) hypertension: Secondary | ICD-10-CM | POA: Diagnosis not present

## 2017-12-20 DIAGNOSIS — R634 Abnormal weight loss: Secondary | ICD-10-CM | POA: Diagnosis not present

## 2017-12-20 DIAGNOSIS — R451 Restlessness and agitation: Secondary | ICD-10-CM | POA: Diagnosis not present

## 2017-12-20 DIAGNOSIS — I6523 Occlusion and stenosis of bilateral carotid arteries: Secondary | ICD-10-CM | POA: Diagnosis not present

## 2017-12-20 DIAGNOSIS — R410 Disorientation, unspecified: Secondary | ICD-10-CM | POA: Diagnosis not present

## 2017-12-21 DIAGNOSIS — R451 Restlessness and agitation: Secondary | ICD-10-CM | POA: Diagnosis not present

## 2017-12-21 DIAGNOSIS — R296 Repeated falls: Secondary | ICD-10-CM | POA: Diagnosis not present

## 2017-12-21 DIAGNOSIS — I6523 Occlusion and stenosis of bilateral carotid arteries: Secondary | ICD-10-CM | POA: Diagnosis not present

## 2017-12-21 DIAGNOSIS — R410 Disorientation, unspecified: Secondary | ICD-10-CM | POA: Diagnosis not present

## 2017-12-21 DIAGNOSIS — Z515 Encounter for palliative care: Secondary | ICD-10-CM | POA: Diagnosis not present

## 2017-12-21 DIAGNOSIS — I10 Essential (primary) hypertension: Secondary | ICD-10-CM | POA: Diagnosis not present

## 2017-12-21 DIAGNOSIS — I779 Disorder of arteries and arterioles, unspecified: Secondary | ICD-10-CM | POA: Diagnosis not present

## 2017-12-21 DIAGNOSIS — R634 Abnormal weight loss: Secondary | ICD-10-CM | POA: Diagnosis not present

## 2017-12-22 DIAGNOSIS — Z515 Encounter for palliative care: Secondary | ICD-10-CM | POA: Diagnosis not present

## 2017-12-22 DIAGNOSIS — I10 Essential (primary) hypertension: Secondary | ICD-10-CM | POA: Diagnosis not present

## 2017-12-22 DIAGNOSIS — R634 Abnormal weight loss: Secondary | ICD-10-CM | POA: Diagnosis not present

## 2017-12-22 DIAGNOSIS — R296 Repeated falls: Secondary | ICD-10-CM | POA: Diagnosis not present

## 2017-12-22 DIAGNOSIS — R410 Disorientation, unspecified: Secondary | ICD-10-CM | POA: Diagnosis not present

## 2017-12-22 DIAGNOSIS — I6523 Occlusion and stenosis of bilateral carotid arteries: Secondary | ICD-10-CM | POA: Diagnosis not present

## 2017-12-22 DIAGNOSIS — I779 Disorder of arteries and arterioles, unspecified: Secondary | ICD-10-CM | POA: Diagnosis not present

## 2017-12-22 DIAGNOSIS — R451 Restlessness and agitation: Secondary | ICD-10-CM | POA: Diagnosis not present

## 2017-12-23 DIAGNOSIS — R451 Restlessness and agitation: Secondary | ICD-10-CM | POA: Diagnosis not present

## 2017-12-23 DIAGNOSIS — R634 Abnormal weight loss: Secondary | ICD-10-CM | POA: Diagnosis not present

## 2017-12-23 DIAGNOSIS — I779 Disorder of arteries and arterioles, unspecified: Secondary | ICD-10-CM | POA: Diagnosis not present

## 2017-12-23 DIAGNOSIS — I10 Essential (primary) hypertension: Secondary | ICD-10-CM | POA: Diagnosis not present

## 2017-12-23 DIAGNOSIS — R410 Disorientation, unspecified: Secondary | ICD-10-CM | POA: Diagnosis not present

## 2017-12-23 DIAGNOSIS — R296 Repeated falls: Secondary | ICD-10-CM | POA: Diagnosis not present

## 2017-12-23 DIAGNOSIS — I6523 Occlusion and stenosis of bilateral carotid arteries: Secondary | ICD-10-CM | POA: Diagnosis not present

## 2017-12-23 DIAGNOSIS — Z515 Encounter for palliative care: Secondary | ICD-10-CM | POA: Diagnosis not present

## 2017-12-24 DIAGNOSIS — I10 Essential (primary) hypertension: Secondary | ICD-10-CM | POA: Diagnosis not present

## 2017-12-24 DIAGNOSIS — I6523 Occlusion and stenosis of bilateral carotid arteries: Secondary | ICD-10-CM | POA: Diagnosis not present

## 2017-12-24 DIAGNOSIS — I779 Disorder of arteries and arterioles, unspecified: Secondary | ICD-10-CM | POA: Diagnosis not present

## 2017-12-24 DIAGNOSIS — Z515 Encounter for palliative care: Secondary | ICD-10-CM | POA: Diagnosis not present

## 2017-12-24 DIAGNOSIS — R296 Repeated falls: Secondary | ICD-10-CM | POA: Diagnosis not present

## 2017-12-24 DIAGNOSIS — R410 Disorientation, unspecified: Secondary | ICD-10-CM | POA: Diagnosis not present

## 2017-12-24 DIAGNOSIS — R451 Restlessness and agitation: Secondary | ICD-10-CM | POA: Diagnosis not present

## 2017-12-24 DIAGNOSIS — R634 Abnormal weight loss: Secondary | ICD-10-CM | POA: Diagnosis not present

## 2017-12-25 DIAGNOSIS — I6523 Occlusion and stenosis of bilateral carotid arteries: Secondary | ICD-10-CM | POA: Diagnosis not present

## 2017-12-25 DIAGNOSIS — R296 Repeated falls: Secondary | ICD-10-CM | POA: Diagnosis not present

## 2017-12-25 DIAGNOSIS — I779 Disorder of arteries and arterioles, unspecified: Secondary | ICD-10-CM | POA: Diagnosis not present

## 2017-12-25 DIAGNOSIS — R451 Restlessness and agitation: Secondary | ICD-10-CM | POA: Diagnosis not present

## 2017-12-25 DIAGNOSIS — Z515 Encounter for palliative care: Secondary | ICD-10-CM | POA: Diagnosis not present

## 2017-12-25 DIAGNOSIS — R634 Abnormal weight loss: Secondary | ICD-10-CM | POA: Diagnosis not present

## 2017-12-25 DIAGNOSIS — I10 Essential (primary) hypertension: Secondary | ICD-10-CM | POA: Diagnosis not present

## 2017-12-25 DIAGNOSIS — R410 Disorientation, unspecified: Secondary | ICD-10-CM | POA: Diagnosis not present

## 2017-12-26 DIAGNOSIS — Z515 Encounter for palliative care: Secondary | ICD-10-CM | POA: Diagnosis not present

## 2017-12-26 DIAGNOSIS — R451 Restlessness and agitation: Secondary | ICD-10-CM | POA: Diagnosis not present

## 2017-12-26 DIAGNOSIS — R410 Disorientation, unspecified: Secondary | ICD-10-CM | POA: Diagnosis not present

## 2017-12-26 DIAGNOSIS — I6523 Occlusion and stenosis of bilateral carotid arteries: Secondary | ICD-10-CM | POA: Diagnosis not present

## 2017-12-26 DIAGNOSIS — I779 Disorder of arteries and arterioles, unspecified: Secondary | ICD-10-CM | POA: Diagnosis not present

## 2017-12-26 DIAGNOSIS — R296 Repeated falls: Secondary | ICD-10-CM | POA: Diagnosis not present

## 2017-12-26 DIAGNOSIS — R634 Abnormal weight loss: Secondary | ICD-10-CM | POA: Diagnosis not present

## 2017-12-26 DIAGNOSIS — I10 Essential (primary) hypertension: Secondary | ICD-10-CM | POA: Diagnosis not present

## 2017-12-27 DIAGNOSIS — I779 Disorder of arteries and arterioles, unspecified: Secondary | ICD-10-CM | POA: Diagnosis not present

## 2017-12-27 DIAGNOSIS — R410 Disorientation, unspecified: Secondary | ICD-10-CM | POA: Diagnosis not present

## 2017-12-27 DIAGNOSIS — R634 Abnormal weight loss: Secondary | ICD-10-CM | POA: Diagnosis not present

## 2017-12-27 DIAGNOSIS — I6523 Occlusion and stenosis of bilateral carotid arteries: Secondary | ICD-10-CM | POA: Diagnosis not present

## 2017-12-27 DIAGNOSIS — R296 Repeated falls: Secondary | ICD-10-CM | POA: Diagnosis not present

## 2017-12-27 DIAGNOSIS — I10 Essential (primary) hypertension: Secondary | ICD-10-CM | POA: Diagnosis not present

## 2017-12-27 DIAGNOSIS — R451 Restlessness and agitation: Secondary | ICD-10-CM | POA: Diagnosis not present

## 2017-12-27 DIAGNOSIS — Z515 Encounter for palliative care: Secondary | ICD-10-CM | POA: Diagnosis not present

## 2017-12-28 DIAGNOSIS — I10 Essential (primary) hypertension: Secondary | ICD-10-CM | POA: Diagnosis not present

## 2017-12-28 DIAGNOSIS — R634 Abnormal weight loss: Secondary | ICD-10-CM | POA: Diagnosis not present

## 2017-12-28 DIAGNOSIS — I779 Disorder of arteries and arterioles, unspecified: Secondary | ICD-10-CM | POA: Diagnosis not present

## 2017-12-28 DIAGNOSIS — R451 Restlessness and agitation: Secondary | ICD-10-CM | POA: Diagnosis not present

## 2017-12-28 DIAGNOSIS — R296 Repeated falls: Secondary | ICD-10-CM | POA: Diagnosis not present

## 2017-12-28 DIAGNOSIS — I6523 Occlusion and stenosis of bilateral carotid arteries: Secondary | ICD-10-CM | POA: Diagnosis not present

## 2017-12-28 DIAGNOSIS — Z515 Encounter for palliative care: Secondary | ICD-10-CM | POA: Diagnosis not present

## 2017-12-28 DIAGNOSIS — R410 Disorientation, unspecified: Secondary | ICD-10-CM | POA: Diagnosis not present

## 2017-12-29 DIAGNOSIS — I6523 Occlusion and stenosis of bilateral carotid arteries: Secondary | ICD-10-CM | POA: Diagnosis not present

## 2017-12-29 DIAGNOSIS — R451 Restlessness and agitation: Secondary | ICD-10-CM | POA: Diagnosis not present

## 2017-12-29 DIAGNOSIS — R296 Repeated falls: Secondary | ICD-10-CM | POA: Diagnosis not present

## 2017-12-29 DIAGNOSIS — I779 Disorder of arteries and arterioles, unspecified: Secondary | ICD-10-CM | POA: Diagnosis not present

## 2017-12-29 DIAGNOSIS — R410 Disorientation, unspecified: Secondary | ICD-10-CM | POA: Diagnosis not present

## 2017-12-29 DIAGNOSIS — R634 Abnormal weight loss: Secondary | ICD-10-CM | POA: Diagnosis not present

## 2017-12-29 DIAGNOSIS — Z515 Encounter for palliative care: Secondary | ICD-10-CM | POA: Diagnosis not present

## 2017-12-29 DIAGNOSIS — I10 Essential (primary) hypertension: Secondary | ICD-10-CM | POA: Diagnosis not present

## 2017-12-30 DIAGNOSIS — I6523 Occlusion and stenosis of bilateral carotid arteries: Secondary | ICD-10-CM | POA: Diagnosis not present

## 2017-12-30 DIAGNOSIS — R296 Repeated falls: Secondary | ICD-10-CM | POA: Diagnosis not present

## 2017-12-30 DIAGNOSIS — R451 Restlessness and agitation: Secondary | ICD-10-CM | POA: Diagnosis not present

## 2017-12-30 DIAGNOSIS — I779 Disorder of arteries and arterioles, unspecified: Secondary | ICD-10-CM | POA: Diagnosis not present

## 2017-12-30 DIAGNOSIS — R634 Abnormal weight loss: Secondary | ICD-10-CM | POA: Diagnosis not present

## 2017-12-30 DIAGNOSIS — Z515 Encounter for palliative care: Secondary | ICD-10-CM | POA: Diagnosis not present

## 2017-12-30 DIAGNOSIS — I10 Essential (primary) hypertension: Secondary | ICD-10-CM | POA: Diagnosis not present

## 2017-12-30 DIAGNOSIS — R410 Disorientation, unspecified: Secondary | ICD-10-CM | POA: Diagnosis not present

## 2017-12-31 DIAGNOSIS — F0150 Vascular dementia without behavioral disturbance: Secondary | ICD-10-CM | POA: Diagnosis not present

## 2017-12-31 DIAGNOSIS — I10 Essential (primary) hypertension: Secondary | ICD-10-CM | POA: Diagnosis not present

## 2017-12-31 DIAGNOSIS — R634 Abnormal weight loss: Secondary | ICD-10-CM | POA: Diagnosis not present

## 2017-12-31 DIAGNOSIS — Z515 Encounter for palliative care: Secondary | ICD-10-CM | POA: Diagnosis not present

## 2017-12-31 DIAGNOSIS — R451 Restlessness and agitation: Secondary | ICD-10-CM | POA: Diagnosis not present

## 2017-12-31 DIAGNOSIS — I779 Disorder of arteries and arterioles, unspecified: Secondary | ICD-10-CM | POA: Diagnosis not present

## 2017-12-31 DIAGNOSIS — R296 Repeated falls: Secondary | ICD-10-CM | POA: Diagnosis not present

## 2017-12-31 DIAGNOSIS — R410 Disorientation, unspecified: Secondary | ICD-10-CM | POA: Diagnosis not present

## 2017-12-31 DIAGNOSIS — I6523 Occlusion and stenosis of bilateral carotid arteries: Secondary | ICD-10-CM | POA: Diagnosis not present

## 2018-01-01 DIAGNOSIS — F0150 Vascular dementia without behavioral disturbance: Secondary | ICD-10-CM | POA: Diagnosis not present

## 2018-01-01 DIAGNOSIS — Z515 Encounter for palliative care: Secondary | ICD-10-CM | POA: Diagnosis not present

## 2018-01-01 DIAGNOSIS — R410 Disorientation, unspecified: Secondary | ICD-10-CM | POA: Diagnosis not present

## 2018-01-01 DIAGNOSIS — I10 Essential (primary) hypertension: Secondary | ICD-10-CM | POA: Diagnosis not present

## 2018-01-01 DIAGNOSIS — R451 Restlessness and agitation: Secondary | ICD-10-CM | POA: Diagnosis not present

## 2018-01-01 DIAGNOSIS — I6523 Occlusion and stenosis of bilateral carotid arteries: Secondary | ICD-10-CM | POA: Diagnosis not present

## 2018-01-01 DIAGNOSIS — I779 Disorder of arteries and arterioles, unspecified: Secondary | ICD-10-CM | POA: Diagnosis not present

## 2018-01-01 DIAGNOSIS — R296 Repeated falls: Secondary | ICD-10-CM | POA: Diagnosis not present

## 2018-01-01 DIAGNOSIS — R634 Abnormal weight loss: Secondary | ICD-10-CM | POA: Diagnosis not present

## 2018-01-02 DIAGNOSIS — F0150 Vascular dementia without behavioral disturbance: Secondary | ICD-10-CM | POA: Diagnosis not present

## 2018-01-02 DIAGNOSIS — R634 Abnormal weight loss: Secondary | ICD-10-CM | POA: Diagnosis not present

## 2018-01-02 DIAGNOSIS — R410 Disorientation, unspecified: Secondary | ICD-10-CM | POA: Diagnosis not present

## 2018-01-02 DIAGNOSIS — Z515 Encounter for palliative care: Secondary | ICD-10-CM | POA: Diagnosis not present

## 2018-01-02 DIAGNOSIS — I10 Essential (primary) hypertension: Secondary | ICD-10-CM | POA: Diagnosis not present

## 2018-01-02 DIAGNOSIS — R296 Repeated falls: Secondary | ICD-10-CM | POA: Diagnosis not present

## 2018-01-02 DIAGNOSIS — I6523 Occlusion and stenosis of bilateral carotid arteries: Secondary | ICD-10-CM | POA: Diagnosis not present

## 2018-01-02 DIAGNOSIS — I779 Disorder of arteries and arterioles, unspecified: Secondary | ICD-10-CM | POA: Diagnosis not present

## 2018-01-02 DIAGNOSIS — R451 Restlessness and agitation: Secondary | ICD-10-CM | POA: Diagnosis not present

## 2018-01-03 DIAGNOSIS — F0150 Vascular dementia without behavioral disturbance: Secondary | ICD-10-CM | POA: Diagnosis not present

## 2018-01-03 DIAGNOSIS — R296 Repeated falls: Secondary | ICD-10-CM | POA: Diagnosis not present

## 2018-01-03 DIAGNOSIS — R451 Restlessness and agitation: Secondary | ICD-10-CM | POA: Diagnosis not present

## 2018-01-03 DIAGNOSIS — I6523 Occlusion and stenosis of bilateral carotid arteries: Secondary | ICD-10-CM | POA: Diagnosis not present

## 2018-01-03 DIAGNOSIS — Z515 Encounter for palliative care: Secondary | ICD-10-CM | POA: Diagnosis not present

## 2018-01-03 DIAGNOSIS — R634 Abnormal weight loss: Secondary | ICD-10-CM | POA: Diagnosis not present

## 2018-01-03 DIAGNOSIS — I779 Disorder of arteries and arterioles, unspecified: Secondary | ICD-10-CM | POA: Diagnosis not present

## 2018-01-03 DIAGNOSIS — I10 Essential (primary) hypertension: Secondary | ICD-10-CM | POA: Diagnosis not present

## 2018-01-03 DIAGNOSIS — R410 Disorientation, unspecified: Secondary | ICD-10-CM | POA: Diagnosis not present

## 2018-01-04 DIAGNOSIS — I6523 Occlusion and stenosis of bilateral carotid arteries: Secondary | ICD-10-CM | POA: Diagnosis not present

## 2018-01-04 DIAGNOSIS — R296 Repeated falls: Secondary | ICD-10-CM | POA: Diagnosis not present

## 2018-01-04 DIAGNOSIS — R451 Restlessness and agitation: Secondary | ICD-10-CM | POA: Diagnosis not present

## 2018-01-04 DIAGNOSIS — I779 Disorder of arteries and arterioles, unspecified: Secondary | ICD-10-CM | POA: Diagnosis not present

## 2018-01-04 DIAGNOSIS — F0150 Vascular dementia without behavioral disturbance: Secondary | ICD-10-CM | POA: Diagnosis not present

## 2018-01-04 DIAGNOSIS — Z515 Encounter for palliative care: Secondary | ICD-10-CM | POA: Diagnosis not present

## 2018-01-04 DIAGNOSIS — R634 Abnormal weight loss: Secondary | ICD-10-CM | POA: Diagnosis not present

## 2018-01-04 DIAGNOSIS — I10 Essential (primary) hypertension: Secondary | ICD-10-CM | POA: Diagnosis not present

## 2018-01-04 DIAGNOSIS — R410 Disorientation, unspecified: Secondary | ICD-10-CM | POA: Diagnosis not present

## 2018-01-05 DIAGNOSIS — R634 Abnormal weight loss: Secondary | ICD-10-CM | POA: Diagnosis not present

## 2018-01-05 DIAGNOSIS — R410 Disorientation, unspecified: Secondary | ICD-10-CM | POA: Diagnosis not present

## 2018-01-05 DIAGNOSIS — R451 Restlessness and agitation: Secondary | ICD-10-CM | POA: Diagnosis not present

## 2018-01-05 DIAGNOSIS — I10 Essential (primary) hypertension: Secondary | ICD-10-CM | POA: Diagnosis not present

## 2018-01-05 DIAGNOSIS — R296 Repeated falls: Secondary | ICD-10-CM | POA: Diagnosis not present

## 2018-01-05 DIAGNOSIS — F0150 Vascular dementia without behavioral disturbance: Secondary | ICD-10-CM | POA: Diagnosis not present

## 2018-01-05 DIAGNOSIS — I779 Disorder of arteries and arterioles, unspecified: Secondary | ICD-10-CM | POA: Diagnosis not present

## 2018-01-05 DIAGNOSIS — Z515 Encounter for palliative care: Secondary | ICD-10-CM | POA: Diagnosis not present

## 2018-01-05 DIAGNOSIS — I6523 Occlusion and stenosis of bilateral carotid arteries: Secondary | ICD-10-CM | POA: Diagnosis not present

## 2018-01-06 DIAGNOSIS — I10 Essential (primary) hypertension: Secondary | ICD-10-CM | POA: Diagnosis not present

## 2018-01-06 DIAGNOSIS — Z515 Encounter for palliative care: Secondary | ICD-10-CM | POA: Diagnosis not present

## 2018-01-06 DIAGNOSIS — I6523 Occlusion and stenosis of bilateral carotid arteries: Secondary | ICD-10-CM | POA: Diagnosis not present

## 2018-01-06 DIAGNOSIS — R634 Abnormal weight loss: Secondary | ICD-10-CM | POA: Diagnosis not present

## 2018-01-06 DIAGNOSIS — I779 Disorder of arteries and arterioles, unspecified: Secondary | ICD-10-CM | POA: Diagnosis not present

## 2018-01-06 DIAGNOSIS — R451 Restlessness and agitation: Secondary | ICD-10-CM | POA: Diagnosis not present

## 2018-01-06 DIAGNOSIS — R296 Repeated falls: Secondary | ICD-10-CM | POA: Diagnosis not present

## 2018-01-06 DIAGNOSIS — R410 Disorientation, unspecified: Secondary | ICD-10-CM | POA: Diagnosis not present

## 2018-01-06 DIAGNOSIS — F0150 Vascular dementia without behavioral disturbance: Secondary | ICD-10-CM | POA: Diagnosis not present

## 2018-01-07 DIAGNOSIS — Z515 Encounter for palliative care: Secondary | ICD-10-CM | POA: Diagnosis not present

## 2018-01-07 DIAGNOSIS — R296 Repeated falls: Secondary | ICD-10-CM | POA: Diagnosis not present

## 2018-01-07 DIAGNOSIS — R410 Disorientation, unspecified: Secondary | ICD-10-CM | POA: Diagnosis not present

## 2018-01-07 DIAGNOSIS — R451 Restlessness and agitation: Secondary | ICD-10-CM | POA: Diagnosis not present

## 2018-01-07 DIAGNOSIS — I779 Disorder of arteries and arterioles, unspecified: Secondary | ICD-10-CM | POA: Diagnosis not present

## 2018-01-07 DIAGNOSIS — R634 Abnormal weight loss: Secondary | ICD-10-CM | POA: Diagnosis not present

## 2018-01-07 DIAGNOSIS — I6523 Occlusion and stenosis of bilateral carotid arteries: Secondary | ICD-10-CM | POA: Diagnosis not present

## 2018-01-07 DIAGNOSIS — F0150 Vascular dementia without behavioral disturbance: Secondary | ICD-10-CM | POA: Diagnosis not present

## 2018-01-07 DIAGNOSIS — I10 Essential (primary) hypertension: Secondary | ICD-10-CM | POA: Diagnosis not present

## 2018-01-08 DIAGNOSIS — R634 Abnormal weight loss: Secondary | ICD-10-CM | POA: Diagnosis not present

## 2018-01-08 DIAGNOSIS — F0150 Vascular dementia without behavioral disturbance: Secondary | ICD-10-CM | POA: Diagnosis not present

## 2018-01-08 DIAGNOSIS — I779 Disorder of arteries and arterioles, unspecified: Secondary | ICD-10-CM | POA: Diagnosis not present

## 2018-01-08 DIAGNOSIS — I10 Essential (primary) hypertension: Secondary | ICD-10-CM | POA: Diagnosis not present

## 2018-01-08 DIAGNOSIS — R410 Disorientation, unspecified: Secondary | ICD-10-CM | POA: Diagnosis not present

## 2018-01-08 DIAGNOSIS — Z515 Encounter for palliative care: Secondary | ICD-10-CM | POA: Diagnosis not present

## 2018-01-08 DIAGNOSIS — R451 Restlessness and agitation: Secondary | ICD-10-CM | POA: Diagnosis not present

## 2018-01-08 DIAGNOSIS — I6523 Occlusion and stenosis of bilateral carotid arteries: Secondary | ICD-10-CM | POA: Diagnosis not present

## 2018-01-08 DIAGNOSIS — R296 Repeated falls: Secondary | ICD-10-CM | POA: Diagnosis not present

## 2018-01-09 DIAGNOSIS — R634 Abnormal weight loss: Secondary | ICD-10-CM | POA: Diagnosis not present

## 2018-01-09 DIAGNOSIS — R410 Disorientation, unspecified: Secondary | ICD-10-CM | POA: Diagnosis not present

## 2018-01-09 DIAGNOSIS — F0150 Vascular dementia without behavioral disturbance: Secondary | ICD-10-CM | POA: Diagnosis not present

## 2018-01-09 DIAGNOSIS — R296 Repeated falls: Secondary | ICD-10-CM | POA: Diagnosis not present

## 2018-01-09 DIAGNOSIS — Z515 Encounter for palliative care: Secondary | ICD-10-CM | POA: Diagnosis not present

## 2018-01-09 DIAGNOSIS — I10 Essential (primary) hypertension: Secondary | ICD-10-CM | POA: Diagnosis not present

## 2018-01-09 DIAGNOSIS — I779 Disorder of arteries and arterioles, unspecified: Secondary | ICD-10-CM | POA: Diagnosis not present

## 2018-01-09 DIAGNOSIS — I6523 Occlusion and stenosis of bilateral carotid arteries: Secondary | ICD-10-CM | POA: Diagnosis not present

## 2018-01-09 DIAGNOSIS — R451 Restlessness and agitation: Secondary | ICD-10-CM | POA: Diagnosis not present

## 2018-01-10 DIAGNOSIS — R634 Abnormal weight loss: Secondary | ICD-10-CM | POA: Diagnosis not present

## 2018-01-10 DIAGNOSIS — I10 Essential (primary) hypertension: Secondary | ICD-10-CM | POA: Diagnosis not present

## 2018-01-10 DIAGNOSIS — Z515 Encounter for palliative care: Secondary | ICD-10-CM | POA: Diagnosis not present

## 2018-01-10 DIAGNOSIS — R451 Restlessness and agitation: Secondary | ICD-10-CM | POA: Diagnosis not present

## 2018-01-10 DIAGNOSIS — F0150 Vascular dementia without behavioral disturbance: Secondary | ICD-10-CM | POA: Diagnosis not present

## 2018-01-10 DIAGNOSIS — I6523 Occlusion and stenosis of bilateral carotid arteries: Secondary | ICD-10-CM | POA: Diagnosis not present

## 2018-01-10 DIAGNOSIS — R296 Repeated falls: Secondary | ICD-10-CM | POA: Diagnosis not present

## 2018-01-10 DIAGNOSIS — I779 Disorder of arteries and arterioles, unspecified: Secondary | ICD-10-CM | POA: Diagnosis not present

## 2018-01-10 DIAGNOSIS — R410 Disorientation, unspecified: Secondary | ICD-10-CM | POA: Diagnosis not present

## 2018-01-11 DIAGNOSIS — I779 Disorder of arteries and arterioles, unspecified: Secondary | ICD-10-CM | POA: Diagnosis not present

## 2018-01-11 DIAGNOSIS — R410 Disorientation, unspecified: Secondary | ICD-10-CM | POA: Diagnosis not present

## 2018-01-11 DIAGNOSIS — F0150 Vascular dementia without behavioral disturbance: Secondary | ICD-10-CM | POA: Diagnosis not present

## 2018-01-11 DIAGNOSIS — R634 Abnormal weight loss: Secondary | ICD-10-CM | POA: Diagnosis not present

## 2018-01-11 DIAGNOSIS — R451 Restlessness and agitation: Secondary | ICD-10-CM | POA: Diagnosis not present

## 2018-01-11 DIAGNOSIS — Z515 Encounter for palliative care: Secondary | ICD-10-CM | POA: Diagnosis not present

## 2018-01-11 DIAGNOSIS — I6523 Occlusion and stenosis of bilateral carotid arteries: Secondary | ICD-10-CM | POA: Diagnosis not present

## 2018-01-11 DIAGNOSIS — R296 Repeated falls: Secondary | ICD-10-CM | POA: Diagnosis not present

## 2018-01-11 DIAGNOSIS — I10 Essential (primary) hypertension: Secondary | ICD-10-CM | POA: Diagnosis not present

## 2018-01-12 DIAGNOSIS — R451 Restlessness and agitation: Secondary | ICD-10-CM | POA: Diagnosis not present

## 2018-01-12 DIAGNOSIS — I6523 Occlusion and stenosis of bilateral carotid arteries: Secondary | ICD-10-CM | POA: Diagnosis not present

## 2018-01-12 DIAGNOSIS — F0150 Vascular dementia without behavioral disturbance: Secondary | ICD-10-CM | POA: Diagnosis not present

## 2018-01-12 DIAGNOSIS — R634 Abnormal weight loss: Secondary | ICD-10-CM | POA: Diagnosis not present

## 2018-01-12 DIAGNOSIS — I10 Essential (primary) hypertension: Secondary | ICD-10-CM | POA: Diagnosis not present

## 2018-01-12 DIAGNOSIS — Z515 Encounter for palliative care: Secondary | ICD-10-CM | POA: Diagnosis not present

## 2018-01-12 DIAGNOSIS — R296 Repeated falls: Secondary | ICD-10-CM | POA: Diagnosis not present

## 2018-01-12 DIAGNOSIS — R410 Disorientation, unspecified: Secondary | ICD-10-CM | POA: Diagnosis not present

## 2018-01-12 DIAGNOSIS — I779 Disorder of arteries and arterioles, unspecified: Secondary | ICD-10-CM | POA: Diagnosis not present

## 2018-01-13 DIAGNOSIS — R410 Disorientation, unspecified: Secondary | ICD-10-CM | POA: Diagnosis not present

## 2018-01-13 DIAGNOSIS — R296 Repeated falls: Secondary | ICD-10-CM | POA: Diagnosis not present

## 2018-01-13 DIAGNOSIS — I10 Essential (primary) hypertension: Secondary | ICD-10-CM | POA: Diagnosis not present

## 2018-01-13 DIAGNOSIS — R451 Restlessness and agitation: Secondary | ICD-10-CM | POA: Diagnosis not present

## 2018-01-13 DIAGNOSIS — R634 Abnormal weight loss: Secondary | ICD-10-CM | POA: Diagnosis not present

## 2018-01-13 DIAGNOSIS — I6523 Occlusion and stenosis of bilateral carotid arteries: Secondary | ICD-10-CM | POA: Diagnosis not present

## 2018-01-13 DIAGNOSIS — I779 Disorder of arteries and arterioles, unspecified: Secondary | ICD-10-CM | POA: Diagnosis not present

## 2018-01-13 DIAGNOSIS — Z515 Encounter for palliative care: Secondary | ICD-10-CM | POA: Diagnosis not present

## 2018-01-13 DIAGNOSIS — F0150 Vascular dementia without behavioral disturbance: Secondary | ICD-10-CM | POA: Diagnosis not present

## 2018-01-14 DIAGNOSIS — R634 Abnormal weight loss: Secondary | ICD-10-CM | POA: Diagnosis not present

## 2018-01-14 DIAGNOSIS — F0150 Vascular dementia without behavioral disturbance: Secondary | ICD-10-CM | POA: Diagnosis not present

## 2018-01-14 DIAGNOSIS — R410 Disorientation, unspecified: Secondary | ICD-10-CM | POA: Diagnosis not present

## 2018-01-14 DIAGNOSIS — I779 Disorder of arteries and arterioles, unspecified: Secondary | ICD-10-CM | POA: Diagnosis not present

## 2018-01-14 DIAGNOSIS — R296 Repeated falls: Secondary | ICD-10-CM | POA: Diagnosis not present

## 2018-01-14 DIAGNOSIS — I10 Essential (primary) hypertension: Secondary | ICD-10-CM | POA: Diagnosis not present

## 2018-01-14 DIAGNOSIS — Z515 Encounter for palliative care: Secondary | ICD-10-CM | POA: Diagnosis not present

## 2018-01-14 DIAGNOSIS — I6523 Occlusion and stenosis of bilateral carotid arteries: Secondary | ICD-10-CM | POA: Diagnosis not present

## 2018-01-14 DIAGNOSIS — R451 Restlessness and agitation: Secondary | ICD-10-CM | POA: Diagnosis not present

## 2018-01-15 DIAGNOSIS — R296 Repeated falls: Secondary | ICD-10-CM | POA: Diagnosis not present

## 2018-01-15 DIAGNOSIS — F0150 Vascular dementia without behavioral disturbance: Secondary | ICD-10-CM | POA: Diagnosis not present

## 2018-01-15 DIAGNOSIS — R451 Restlessness and agitation: Secondary | ICD-10-CM | POA: Diagnosis not present

## 2018-01-15 DIAGNOSIS — I10 Essential (primary) hypertension: Secondary | ICD-10-CM | POA: Diagnosis not present

## 2018-01-15 DIAGNOSIS — R634 Abnormal weight loss: Secondary | ICD-10-CM | POA: Diagnosis not present

## 2018-01-15 DIAGNOSIS — R410 Disorientation, unspecified: Secondary | ICD-10-CM | POA: Diagnosis not present

## 2018-01-15 DIAGNOSIS — Z515 Encounter for palliative care: Secondary | ICD-10-CM | POA: Diagnosis not present

## 2018-01-15 DIAGNOSIS — I779 Disorder of arteries and arterioles, unspecified: Secondary | ICD-10-CM | POA: Diagnosis not present

## 2018-01-15 DIAGNOSIS — I6523 Occlusion and stenosis of bilateral carotid arteries: Secondary | ICD-10-CM | POA: Diagnosis not present

## 2018-01-16 DIAGNOSIS — F0150 Vascular dementia without behavioral disturbance: Secondary | ICD-10-CM | POA: Diagnosis not present

## 2018-01-16 DIAGNOSIS — R410 Disorientation, unspecified: Secondary | ICD-10-CM | POA: Diagnosis not present

## 2018-01-16 DIAGNOSIS — I779 Disorder of arteries and arterioles, unspecified: Secondary | ICD-10-CM | POA: Diagnosis not present

## 2018-01-16 DIAGNOSIS — R634 Abnormal weight loss: Secondary | ICD-10-CM | POA: Diagnosis not present

## 2018-01-16 DIAGNOSIS — R451 Restlessness and agitation: Secondary | ICD-10-CM | POA: Diagnosis not present

## 2018-01-16 DIAGNOSIS — Z515 Encounter for palliative care: Secondary | ICD-10-CM | POA: Diagnosis not present

## 2018-01-16 DIAGNOSIS — I6523 Occlusion and stenosis of bilateral carotid arteries: Secondary | ICD-10-CM | POA: Diagnosis not present

## 2018-01-16 DIAGNOSIS — R296 Repeated falls: Secondary | ICD-10-CM | POA: Diagnosis not present

## 2018-01-16 DIAGNOSIS — I10 Essential (primary) hypertension: Secondary | ICD-10-CM | POA: Diagnosis not present

## 2018-01-17 DIAGNOSIS — I779 Disorder of arteries and arterioles, unspecified: Secondary | ICD-10-CM | POA: Diagnosis not present

## 2018-01-17 DIAGNOSIS — R296 Repeated falls: Secondary | ICD-10-CM | POA: Diagnosis not present

## 2018-01-17 DIAGNOSIS — I6523 Occlusion and stenosis of bilateral carotid arteries: Secondary | ICD-10-CM | POA: Diagnosis not present

## 2018-01-17 DIAGNOSIS — F0150 Vascular dementia without behavioral disturbance: Secondary | ICD-10-CM | POA: Diagnosis not present

## 2018-01-17 DIAGNOSIS — R451 Restlessness and agitation: Secondary | ICD-10-CM | POA: Diagnosis not present

## 2018-01-17 DIAGNOSIS — R634 Abnormal weight loss: Secondary | ICD-10-CM | POA: Diagnosis not present

## 2018-01-17 DIAGNOSIS — R410 Disorientation, unspecified: Secondary | ICD-10-CM | POA: Diagnosis not present

## 2018-01-17 DIAGNOSIS — Z515 Encounter for palliative care: Secondary | ICD-10-CM | POA: Diagnosis not present

## 2018-01-17 DIAGNOSIS — I10 Essential (primary) hypertension: Secondary | ICD-10-CM | POA: Diagnosis not present

## 2018-01-18 DIAGNOSIS — R634 Abnormal weight loss: Secondary | ICD-10-CM | POA: Diagnosis not present

## 2018-01-18 DIAGNOSIS — F0150 Vascular dementia without behavioral disturbance: Secondary | ICD-10-CM | POA: Diagnosis not present

## 2018-01-18 DIAGNOSIS — I6523 Occlusion and stenosis of bilateral carotid arteries: Secondary | ICD-10-CM | POA: Diagnosis not present

## 2018-01-18 DIAGNOSIS — I779 Disorder of arteries and arterioles, unspecified: Secondary | ICD-10-CM | POA: Diagnosis not present

## 2018-01-18 DIAGNOSIS — R451 Restlessness and agitation: Secondary | ICD-10-CM | POA: Diagnosis not present

## 2018-01-18 DIAGNOSIS — R410 Disorientation, unspecified: Secondary | ICD-10-CM | POA: Diagnosis not present

## 2018-01-18 DIAGNOSIS — I10 Essential (primary) hypertension: Secondary | ICD-10-CM | POA: Diagnosis not present

## 2018-01-18 DIAGNOSIS — Z515 Encounter for palliative care: Secondary | ICD-10-CM | POA: Diagnosis not present

## 2018-01-18 DIAGNOSIS — R296 Repeated falls: Secondary | ICD-10-CM | POA: Diagnosis not present

## 2018-01-19 DIAGNOSIS — I6523 Occlusion and stenosis of bilateral carotid arteries: Secondary | ICD-10-CM | POA: Diagnosis not present

## 2018-01-19 DIAGNOSIS — I779 Disorder of arteries and arterioles, unspecified: Secondary | ICD-10-CM | POA: Diagnosis not present

## 2018-01-19 DIAGNOSIS — F0150 Vascular dementia without behavioral disturbance: Secondary | ICD-10-CM | POA: Diagnosis not present

## 2018-01-19 DIAGNOSIS — I10 Essential (primary) hypertension: Secondary | ICD-10-CM | POA: Diagnosis not present

## 2018-01-19 DIAGNOSIS — R410 Disorientation, unspecified: Secondary | ICD-10-CM | POA: Diagnosis not present

## 2018-01-19 DIAGNOSIS — R296 Repeated falls: Secondary | ICD-10-CM | POA: Diagnosis not present

## 2018-01-19 DIAGNOSIS — R634 Abnormal weight loss: Secondary | ICD-10-CM | POA: Diagnosis not present

## 2018-01-19 DIAGNOSIS — Z515 Encounter for palliative care: Secondary | ICD-10-CM | POA: Diagnosis not present

## 2018-01-19 DIAGNOSIS — R451 Restlessness and agitation: Secondary | ICD-10-CM | POA: Diagnosis not present

## 2018-01-20 DIAGNOSIS — R634 Abnormal weight loss: Secondary | ICD-10-CM | POA: Diagnosis not present

## 2018-01-20 DIAGNOSIS — I10 Essential (primary) hypertension: Secondary | ICD-10-CM | POA: Diagnosis not present

## 2018-01-20 DIAGNOSIS — R296 Repeated falls: Secondary | ICD-10-CM | POA: Diagnosis not present

## 2018-01-20 DIAGNOSIS — F0150 Vascular dementia without behavioral disturbance: Secondary | ICD-10-CM | POA: Diagnosis not present

## 2018-01-20 DIAGNOSIS — R410 Disorientation, unspecified: Secondary | ICD-10-CM | POA: Diagnosis not present

## 2018-01-20 DIAGNOSIS — I6523 Occlusion and stenosis of bilateral carotid arteries: Secondary | ICD-10-CM | POA: Diagnosis not present

## 2018-01-20 DIAGNOSIS — R451 Restlessness and agitation: Secondary | ICD-10-CM | POA: Diagnosis not present

## 2018-01-20 DIAGNOSIS — Z515 Encounter for palliative care: Secondary | ICD-10-CM | POA: Diagnosis not present

## 2018-01-20 DIAGNOSIS — I779 Disorder of arteries and arterioles, unspecified: Secondary | ICD-10-CM | POA: Diagnosis not present

## 2018-01-21 DIAGNOSIS — F0150 Vascular dementia without behavioral disturbance: Secondary | ICD-10-CM | POA: Diagnosis not present

## 2018-01-21 DIAGNOSIS — I6523 Occlusion and stenosis of bilateral carotid arteries: Secondary | ICD-10-CM | POA: Diagnosis not present

## 2018-01-21 DIAGNOSIS — Z515 Encounter for palliative care: Secondary | ICD-10-CM | POA: Diagnosis not present

## 2018-01-21 DIAGNOSIS — R296 Repeated falls: Secondary | ICD-10-CM | POA: Diagnosis not present

## 2018-01-21 DIAGNOSIS — R451 Restlessness and agitation: Secondary | ICD-10-CM | POA: Diagnosis not present

## 2018-01-21 DIAGNOSIS — I10 Essential (primary) hypertension: Secondary | ICD-10-CM | POA: Diagnosis not present

## 2018-01-21 DIAGNOSIS — R410 Disorientation, unspecified: Secondary | ICD-10-CM | POA: Diagnosis not present

## 2018-01-21 DIAGNOSIS — I779 Disorder of arteries and arterioles, unspecified: Secondary | ICD-10-CM | POA: Diagnosis not present

## 2018-01-21 DIAGNOSIS — R634 Abnormal weight loss: Secondary | ICD-10-CM | POA: Diagnosis not present

## 2018-01-22 DIAGNOSIS — I6523 Occlusion and stenosis of bilateral carotid arteries: Secondary | ICD-10-CM | POA: Diagnosis not present

## 2018-01-22 DIAGNOSIS — F0150 Vascular dementia without behavioral disturbance: Secondary | ICD-10-CM | POA: Diagnosis not present

## 2018-01-22 DIAGNOSIS — R410 Disorientation, unspecified: Secondary | ICD-10-CM | POA: Diagnosis not present

## 2018-01-22 DIAGNOSIS — R634 Abnormal weight loss: Secondary | ICD-10-CM | POA: Diagnosis not present

## 2018-01-22 DIAGNOSIS — I10 Essential (primary) hypertension: Secondary | ICD-10-CM | POA: Diagnosis not present

## 2018-01-22 DIAGNOSIS — Z515 Encounter for palliative care: Secondary | ICD-10-CM | POA: Diagnosis not present

## 2018-01-22 DIAGNOSIS — R451 Restlessness and agitation: Secondary | ICD-10-CM | POA: Diagnosis not present

## 2018-01-22 DIAGNOSIS — R296 Repeated falls: Secondary | ICD-10-CM | POA: Diagnosis not present

## 2018-01-22 DIAGNOSIS — I779 Disorder of arteries and arterioles, unspecified: Secondary | ICD-10-CM | POA: Diagnosis not present

## 2018-01-23 DIAGNOSIS — I779 Disorder of arteries and arterioles, unspecified: Secondary | ICD-10-CM | POA: Diagnosis not present

## 2018-01-23 DIAGNOSIS — F0150 Vascular dementia without behavioral disturbance: Secondary | ICD-10-CM | POA: Diagnosis not present

## 2018-01-23 DIAGNOSIS — R451 Restlessness and agitation: Secondary | ICD-10-CM | POA: Diagnosis not present

## 2018-01-23 DIAGNOSIS — R634 Abnormal weight loss: Secondary | ICD-10-CM | POA: Diagnosis not present

## 2018-01-23 DIAGNOSIS — R296 Repeated falls: Secondary | ICD-10-CM | POA: Diagnosis not present

## 2018-01-23 DIAGNOSIS — I10 Essential (primary) hypertension: Secondary | ICD-10-CM | POA: Diagnosis not present

## 2018-01-23 DIAGNOSIS — Z515 Encounter for palliative care: Secondary | ICD-10-CM | POA: Diagnosis not present

## 2018-01-23 DIAGNOSIS — I6523 Occlusion and stenosis of bilateral carotid arteries: Secondary | ICD-10-CM | POA: Diagnosis not present

## 2018-01-23 DIAGNOSIS — R410 Disorientation, unspecified: Secondary | ICD-10-CM | POA: Diagnosis not present

## 2018-01-24 DIAGNOSIS — R634 Abnormal weight loss: Secondary | ICD-10-CM | POA: Diagnosis not present

## 2018-01-24 DIAGNOSIS — F0150 Vascular dementia without behavioral disturbance: Secondary | ICD-10-CM | POA: Diagnosis not present

## 2018-01-24 DIAGNOSIS — I6523 Occlusion and stenosis of bilateral carotid arteries: Secondary | ICD-10-CM | POA: Diagnosis not present

## 2018-01-24 DIAGNOSIS — R451 Restlessness and agitation: Secondary | ICD-10-CM | POA: Diagnosis not present

## 2018-01-24 DIAGNOSIS — I779 Disorder of arteries and arterioles, unspecified: Secondary | ICD-10-CM | POA: Diagnosis not present

## 2018-01-24 DIAGNOSIS — I10 Essential (primary) hypertension: Secondary | ICD-10-CM | POA: Diagnosis not present

## 2018-01-24 DIAGNOSIS — R410 Disorientation, unspecified: Secondary | ICD-10-CM | POA: Diagnosis not present

## 2018-01-24 DIAGNOSIS — Z515 Encounter for palliative care: Secondary | ICD-10-CM | POA: Diagnosis not present

## 2018-01-24 DIAGNOSIS — R296 Repeated falls: Secondary | ICD-10-CM | POA: Diagnosis not present

## 2018-01-25 DIAGNOSIS — I10 Essential (primary) hypertension: Secondary | ICD-10-CM | POA: Diagnosis not present

## 2018-01-25 DIAGNOSIS — R296 Repeated falls: Secondary | ICD-10-CM | POA: Diagnosis not present

## 2018-01-25 DIAGNOSIS — R634 Abnormal weight loss: Secondary | ICD-10-CM | POA: Diagnosis not present

## 2018-01-25 DIAGNOSIS — F0150 Vascular dementia without behavioral disturbance: Secondary | ICD-10-CM | POA: Diagnosis not present

## 2018-01-25 DIAGNOSIS — Z515 Encounter for palliative care: Secondary | ICD-10-CM | POA: Diagnosis not present

## 2018-01-25 DIAGNOSIS — R410 Disorientation, unspecified: Secondary | ICD-10-CM | POA: Diagnosis not present

## 2018-01-25 DIAGNOSIS — I779 Disorder of arteries and arterioles, unspecified: Secondary | ICD-10-CM | POA: Diagnosis not present

## 2018-01-25 DIAGNOSIS — I6523 Occlusion and stenosis of bilateral carotid arteries: Secondary | ICD-10-CM | POA: Diagnosis not present

## 2018-01-25 DIAGNOSIS — R451 Restlessness and agitation: Secondary | ICD-10-CM | POA: Diagnosis not present

## 2018-01-26 DIAGNOSIS — F0150 Vascular dementia without behavioral disturbance: Secondary | ICD-10-CM | POA: Diagnosis not present

## 2018-01-26 DIAGNOSIS — Z515 Encounter for palliative care: Secondary | ICD-10-CM | POA: Diagnosis not present

## 2018-01-26 DIAGNOSIS — R451 Restlessness and agitation: Secondary | ICD-10-CM | POA: Diagnosis not present

## 2018-01-26 DIAGNOSIS — R296 Repeated falls: Secondary | ICD-10-CM | POA: Diagnosis not present

## 2018-01-26 DIAGNOSIS — R634 Abnormal weight loss: Secondary | ICD-10-CM | POA: Diagnosis not present

## 2018-01-26 DIAGNOSIS — I779 Disorder of arteries and arterioles, unspecified: Secondary | ICD-10-CM | POA: Diagnosis not present

## 2018-01-26 DIAGNOSIS — R410 Disorientation, unspecified: Secondary | ICD-10-CM | POA: Diagnosis not present

## 2018-01-26 DIAGNOSIS — I10 Essential (primary) hypertension: Secondary | ICD-10-CM | POA: Diagnosis not present

## 2018-01-26 DIAGNOSIS — I6523 Occlusion and stenosis of bilateral carotid arteries: Secondary | ICD-10-CM | POA: Diagnosis not present

## 2018-01-27 DIAGNOSIS — R296 Repeated falls: Secondary | ICD-10-CM | POA: Diagnosis not present

## 2018-01-27 DIAGNOSIS — I6523 Occlusion and stenosis of bilateral carotid arteries: Secondary | ICD-10-CM | POA: Diagnosis not present

## 2018-01-27 DIAGNOSIS — R410 Disorientation, unspecified: Secondary | ICD-10-CM | POA: Diagnosis not present

## 2018-01-27 DIAGNOSIS — R634 Abnormal weight loss: Secondary | ICD-10-CM | POA: Diagnosis not present

## 2018-01-27 DIAGNOSIS — I10 Essential (primary) hypertension: Secondary | ICD-10-CM | POA: Diagnosis not present

## 2018-01-27 DIAGNOSIS — F0150 Vascular dementia without behavioral disturbance: Secondary | ICD-10-CM | POA: Diagnosis not present

## 2018-01-27 DIAGNOSIS — R451 Restlessness and agitation: Secondary | ICD-10-CM | POA: Diagnosis not present

## 2018-01-27 DIAGNOSIS — I779 Disorder of arteries and arterioles, unspecified: Secondary | ICD-10-CM | POA: Diagnosis not present

## 2018-01-27 DIAGNOSIS — Z515 Encounter for palliative care: Secondary | ICD-10-CM | POA: Diagnosis not present

## 2018-01-28 DIAGNOSIS — I10 Essential (primary) hypertension: Secondary | ICD-10-CM | POA: Diagnosis not present

## 2018-01-28 DIAGNOSIS — R296 Repeated falls: Secondary | ICD-10-CM | POA: Diagnosis not present

## 2018-01-28 DIAGNOSIS — I6523 Occlusion and stenosis of bilateral carotid arteries: Secondary | ICD-10-CM | POA: Diagnosis not present

## 2018-01-28 DIAGNOSIS — F0150 Vascular dementia without behavioral disturbance: Secondary | ICD-10-CM | POA: Diagnosis not present

## 2018-01-28 DIAGNOSIS — I779 Disorder of arteries and arterioles, unspecified: Secondary | ICD-10-CM | POA: Diagnosis not present

## 2018-01-28 DIAGNOSIS — Z515 Encounter for palliative care: Secondary | ICD-10-CM | POA: Diagnosis not present

## 2018-01-28 DIAGNOSIS — R634 Abnormal weight loss: Secondary | ICD-10-CM | POA: Diagnosis not present

## 2018-01-28 DIAGNOSIS — R410 Disorientation, unspecified: Secondary | ICD-10-CM | POA: Diagnosis not present

## 2018-01-28 DIAGNOSIS — R451 Restlessness and agitation: Secondary | ICD-10-CM | POA: Diagnosis not present

## 2018-01-29 DIAGNOSIS — I779 Disorder of arteries and arterioles, unspecified: Secondary | ICD-10-CM | POA: Diagnosis not present

## 2018-01-29 DIAGNOSIS — I6523 Occlusion and stenosis of bilateral carotid arteries: Secondary | ICD-10-CM | POA: Diagnosis not present

## 2018-01-29 DIAGNOSIS — Z515 Encounter for palliative care: Secondary | ICD-10-CM | POA: Diagnosis not present

## 2018-01-29 DIAGNOSIS — R410 Disorientation, unspecified: Secondary | ICD-10-CM | POA: Diagnosis not present

## 2018-01-29 DIAGNOSIS — I10 Essential (primary) hypertension: Secondary | ICD-10-CM | POA: Diagnosis not present

## 2018-01-29 DIAGNOSIS — R296 Repeated falls: Secondary | ICD-10-CM | POA: Diagnosis not present

## 2018-01-29 DIAGNOSIS — R634 Abnormal weight loss: Secondary | ICD-10-CM | POA: Diagnosis not present

## 2018-01-29 DIAGNOSIS — F0150 Vascular dementia without behavioral disturbance: Secondary | ICD-10-CM | POA: Diagnosis not present

## 2018-01-29 DIAGNOSIS — R451 Restlessness and agitation: Secondary | ICD-10-CM | POA: Diagnosis not present

## 2018-01-30 DIAGNOSIS — I10 Essential (primary) hypertension: Secondary | ICD-10-CM | POA: Diagnosis not present

## 2018-01-30 DIAGNOSIS — R296 Repeated falls: Secondary | ICD-10-CM | POA: Diagnosis not present

## 2018-01-30 DIAGNOSIS — F0150 Vascular dementia without behavioral disturbance: Secondary | ICD-10-CM | POA: Diagnosis not present

## 2018-01-30 DIAGNOSIS — Z515 Encounter for palliative care: Secondary | ICD-10-CM | POA: Diagnosis not present

## 2018-01-30 DIAGNOSIS — R410 Disorientation, unspecified: Secondary | ICD-10-CM | POA: Diagnosis not present

## 2018-01-30 DIAGNOSIS — I779 Disorder of arteries and arterioles, unspecified: Secondary | ICD-10-CM | POA: Diagnosis not present

## 2018-01-30 DIAGNOSIS — R634 Abnormal weight loss: Secondary | ICD-10-CM | POA: Diagnosis not present

## 2018-01-30 DIAGNOSIS — R451 Restlessness and agitation: Secondary | ICD-10-CM | POA: Diagnosis not present

## 2018-01-30 DIAGNOSIS — I6523 Occlusion and stenosis of bilateral carotid arteries: Secondary | ICD-10-CM | POA: Diagnosis not present

## 2018-01-31 DIAGNOSIS — R296 Repeated falls: Secondary | ICD-10-CM | POA: Diagnosis not present

## 2018-01-31 DIAGNOSIS — R634 Abnormal weight loss: Secondary | ICD-10-CM | POA: Diagnosis not present

## 2018-01-31 DIAGNOSIS — Z515 Encounter for palliative care: Secondary | ICD-10-CM | POA: Diagnosis not present

## 2018-01-31 DIAGNOSIS — I779 Disorder of arteries and arterioles, unspecified: Secondary | ICD-10-CM | POA: Diagnosis not present

## 2018-01-31 DIAGNOSIS — R451 Restlessness and agitation: Secondary | ICD-10-CM | POA: Diagnosis not present

## 2018-01-31 DIAGNOSIS — I6523 Occlusion and stenosis of bilateral carotid arteries: Secondary | ICD-10-CM | POA: Diagnosis not present

## 2018-01-31 DIAGNOSIS — I10 Essential (primary) hypertension: Secondary | ICD-10-CM | POA: Diagnosis not present

## 2018-01-31 DIAGNOSIS — F0150 Vascular dementia without behavioral disturbance: Secondary | ICD-10-CM | POA: Diagnosis not present

## 2018-01-31 DIAGNOSIS — R410 Disorientation, unspecified: Secondary | ICD-10-CM | POA: Diagnosis not present

## 2018-02-01 DIAGNOSIS — I779 Disorder of arteries and arterioles, unspecified: Secondary | ICD-10-CM | POA: Diagnosis not present

## 2018-02-01 DIAGNOSIS — R410 Disorientation, unspecified: Secondary | ICD-10-CM | POA: Diagnosis not present

## 2018-02-01 DIAGNOSIS — R296 Repeated falls: Secondary | ICD-10-CM | POA: Diagnosis not present

## 2018-02-01 DIAGNOSIS — R634 Abnormal weight loss: Secondary | ICD-10-CM | POA: Diagnosis not present

## 2018-02-01 DIAGNOSIS — Z515 Encounter for palliative care: Secondary | ICD-10-CM | POA: Diagnosis not present

## 2018-02-01 DIAGNOSIS — F0150 Vascular dementia without behavioral disturbance: Secondary | ICD-10-CM | POA: Diagnosis not present

## 2018-02-01 DIAGNOSIS — I6523 Occlusion and stenosis of bilateral carotid arteries: Secondary | ICD-10-CM | POA: Diagnosis not present

## 2018-02-01 DIAGNOSIS — I10 Essential (primary) hypertension: Secondary | ICD-10-CM | POA: Diagnosis not present

## 2018-02-01 DIAGNOSIS — R451 Restlessness and agitation: Secondary | ICD-10-CM | POA: Diagnosis not present

## 2018-02-02 DIAGNOSIS — I779 Disorder of arteries and arterioles, unspecified: Secondary | ICD-10-CM | POA: Diagnosis not present

## 2018-02-02 DIAGNOSIS — R296 Repeated falls: Secondary | ICD-10-CM | POA: Diagnosis not present

## 2018-02-02 DIAGNOSIS — I6523 Occlusion and stenosis of bilateral carotid arteries: Secondary | ICD-10-CM | POA: Diagnosis not present

## 2018-02-02 DIAGNOSIS — R451 Restlessness and agitation: Secondary | ICD-10-CM | POA: Diagnosis not present

## 2018-02-02 DIAGNOSIS — Z515 Encounter for palliative care: Secondary | ICD-10-CM | POA: Diagnosis not present

## 2018-02-02 DIAGNOSIS — F0150 Vascular dementia without behavioral disturbance: Secondary | ICD-10-CM | POA: Diagnosis not present

## 2018-02-02 DIAGNOSIS — R410 Disorientation, unspecified: Secondary | ICD-10-CM | POA: Diagnosis not present

## 2018-02-02 DIAGNOSIS — I10 Essential (primary) hypertension: Secondary | ICD-10-CM | POA: Diagnosis not present

## 2018-02-02 DIAGNOSIS — R634 Abnormal weight loss: Secondary | ICD-10-CM | POA: Diagnosis not present

## 2018-02-03 DIAGNOSIS — R634 Abnormal weight loss: Secondary | ICD-10-CM | POA: Diagnosis not present

## 2018-02-03 DIAGNOSIS — R296 Repeated falls: Secondary | ICD-10-CM | POA: Diagnosis not present

## 2018-02-03 DIAGNOSIS — I10 Essential (primary) hypertension: Secondary | ICD-10-CM | POA: Diagnosis not present

## 2018-02-03 DIAGNOSIS — Z515 Encounter for palliative care: Secondary | ICD-10-CM | POA: Diagnosis not present

## 2018-02-03 DIAGNOSIS — R410 Disorientation, unspecified: Secondary | ICD-10-CM | POA: Diagnosis not present

## 2018-02-03 DIAGNOSIS — F0150 Vascular dementia without behavioral disturbance: Secondary | ICD-10-CM | POA: Diagnosis not present

## 2018-02-03 DIAGNOSIS — I779 Disorder of arteries and arterioles, unspecified: Secondary | ICD-10-CM | POA: Diagnosis not present

## 2018-02-03 DIAGNOSIS — I6523 Occlusion and stenosis of bilateral carotid arteries: Secondary | ICD-10-CM | POA: Diagnosis not present

## 2018-02-03 DIAGNOSIS — R451 Restlessness and agitation: Secondary | ICD-10-CM | POA: Diagnosis not present

## 2018-02-04 DIAGNOSIS — I6523 Occlusion and stenosis of bilateral carotid arteries: Secondary | ICD-10-CM | POA: Diagnosis not present

## 2018-02-04 DIAGNOSIS — R634 Abnormal weight loss: Secondary | ICD-10-CM | POA: Diagnosis not present

## 2018-02-04 DIAGNOSIS — R410 Disorientation, unspecified: Secondary | ICD-10-CM | POA: Diagnosis not present

## 2018-02-04 DIAGNOSIS — R296 Repeated falls: Secondary | ICD-10-CM | POA: Diagnosis not present

## 2018-02-04 DIAGNOSIS — I779 Disorder of arteries and arterioles, unspecified: Secondary | ICD-10-CM | POA: Diagnosis not present

## 2018-02-04 DIAGNOSIS — Z515 Encounter for palliative care: Secondary | ICD-10-CM | POA: Diagnosis not present

## 2018-02-04 DIAGNOSIS — F0150 Vascular dementia without behavioral disturbance: Secondary | ICD-10-CM | POA: Diagnosis not present

## 2018-02-04 DIAGNOSIS — I10 Essential (primary) hypertension: Secondary | ICD-10-CM | POA: Diagnosis not present

## 2018-02-04 DIAGNOSIS — R451 Restlessness and agitation: Secondary | ICD-10-CM | POA: Diagnosis not present

## 2018-02-05 DIAGNOSIS — R410 Disorientation, unspecified: Secondary | ICD-10-CM | POA: Diagnosis not present

## 2018-02-05 DIAGNOSIS — I779 Disorder of arteries and arterioles, unspecified: Secondary | ICD-10-CM | POA: Diagnosis not present

## 2018-02-05 DIAGNOSIS — R451 Restlessness and agitation: Secondary | ICD-10-CM | POA: Diagnosis not present

## 2018-02-05 DIAGNOSIS — F0150 Vascular dementia without behavioral disturbance: Secondary | ICD-10-CM | POA: Diagnosis not present

## 2018-02-05 DIAGNOSIS — I6523 Occlusion and stenosis of bilateral carotid arteries: Secondary | ICD-10-CM | POA: Diagnosis not present

## 2018-02-05 DIAGNOSIS — Z515 Encounter for palliative care: Secondary | ICD-10-CM | POA: Diagnosis not present

## 2018-02-05 DIAGNOSIS — R296 Repeated falls: Secondary | ICD-10-CM | POA: Diagnosis not present

## 2018-02-05 DIAGNOSIS — R634 Abnormal weight loss: Secondary | ICD-10-CM | POA: Diagnosis not present

## 2018-02-05 DIAGNOSIS — I10 Essential (primary) hypertension: Secondary | ICD-10-CM | POA: Diagnosis not present

## 2018-02-06 DIAGNOSIS — R410 Disorientation, unspecified: Secondary | ICD-10-CM | POA: Diagnosis not present

## 2018-02-06 DIAGNOSIS — R451 Restlessness and agitation: Secondary | ICD-10-CM | POA: Diagnosis not present

## 2018-02-06 DIAGNOSIS — I779 Disorder of arteries and arterioles, unspecified: Secondary | ICD-10-CM | POA: Diagnosis not present

## 2018-02-06 DIAGNOSIS — R634 Abnormal weight loss: Secondary | ICD-10-CM | POA: Diagnosis not present

## 2018-02-06 DIAGNOSIS — F0150 Vascular dementia without behavioral disturbance: Secondary | ICD-10-CM | POA: Diagnosis not present

## 2018-02-06 DIAGNOSIS — I10 Essential (primary) hypertension: Secondary | ICD-10-CM | POA: Diagnosis not present

## 2018-02-06 DIAGNOSIS — I6523 Occlusion and stenosis of bilateral carotid arteries: Secondary | ICD-10-CM | POA: Diagnosis not present

## 2018-02-06 DIAGNOSIS — R296 Repeated falls: Secondary | ICD-10-CM | POA: Diagnosis not present

## 2018-02-06 DIAGNOSIS — Z515 Encounter for palliative care: Secondary | ICD-10-CM | POA: Diagnosis not present

## 2018-02-07 DIAGNOSIS — R634 Abnormal weight loss: Secondary | ICD-10-CM | POA: Diagnosis not present

## 2018-02-07 DIAGNOSIS — F0150 Vascular dementia without behavioral disturbance: Secondary | ICD-10-CM | POA: Diagnosis not present

## 2018-02-07 DIAGNOSIS — R451 Restlessness and agitation: Secondary | ICD-10-CM | POA: Diagnosis not present

## 2018-02-07 DIAGNOSIS — Z515 Encounter for palliative care: Secondary | ICD-10-CM | POA: Diagnosis not present

## 2018-02-07 DIAGNOSIS — I779 Disorder of arteries and arterioles, unspecified: Secondary | ICD-10-CM | POA: Diagnosis not present

## 2018-02-07 DIAGNOSIS — I6523 Occlusion and stenosis of bilateral carotid arteries: Secondary | ICD-10-CM | POA: Diagnosis not present

## 2018-02-07 DIAGNOSIS — I10 Essential (primary) hypertension: Secondary | ICD-10-CM | POA: Diagnosis not present

## 2018-02-07 DIAGNOSIS — R410 Disorientation, unspecified: Secondary | ICD-10-CM | POA: Diagnosis not present

## 2018-02-07 DIAGNOSIS — R296 Repeated falls: Secondary | ICD-10-CM | POA: Diagnosis not present

## 2018-02-08 DIAGNOSIS — R296 Repeated falls: Secondary | ICD-10-CM | POA: Diagnosis not present

## 2018-02-08 DIAGNOSIS — R410 Disorientation, unspecified: Secondary | ICD-10-CM | POA: Diagnosis not present

## 2018-02-08 DIAGNOSIS — I779 Disorder of arteries and arterioles, unspecified: Secondary | ICD-10-CM | POA: Diagnosis not present

## 2018-02-08 DIAGNOSIS — R634 Abnormal weight loss: Secondary | ICD-10-CM | POA: Diagnosis not present

## 2018-02-08 DIAGNOSIS — Z515 Encounter for palliative care: Secondary | ICD-10-CM | POA: Diagnosis not present

## 2018-02-08 DIAGNOSIS — F0150 Vascular dementia without behavioral disturbance: Secondary | ICD-10-CM | POA: Diagnosis not present

## 2018-02-08 DIAGNOSIS — R451 Restlessness and agitation: Secondary | ICD-10-CM | POA: Diagnosis not present

## 2018-02-08 DIAGNOSIS — I10 Essential (primary) hypertension: Secondary | ICD-10-CM | POA: Diagnosis not present

## 2018-02-08 DIAGNOSIS — I6523 Occlusion and stenosis of bilateral carotid arteries: Secondary | ICD-10-CM | POA: Diagnosis not present

## 2018-02-09 DIAGNOSIS — Z515 Encounter for palliative care: Secondary | ICD-10-CM | POA: Diagnosis not present

## 2018-02-09 DIAGNOSIS — R296 Repeated falls: Secondary | ICD-10-CM | POA: Diagnosis not present

## 2018-02-09 DIAGNOSIS — I6523 Occlusion and stenosis of bilateral carotid arteries: Secondary | ICD-10-CM | POA: Diagnosis not present

## 2018-02-09 DIAGNOSIS — R634 Abnormal weight loss: Secondary | ICD-10-CM | POA: Diagnosis not present

## 2018-02-09 DIAGNOSIS — F0150 Vascular dementia without behavioral disturbance: Secondary | ICD-10-CM | POA: Diagnosis not present

## 2018-02-09 DIAGNOSIS — R410 Disorientation, unspecified: Secondary | ICD-10-CM | POA: Diagnosis not present

## 2018-02-09 DIAGNOSIS — R451 Restlessness and agitation: Secondary | ICD-10-CM | POA: Diagnosis not present

## 2018-02-09 DIAGNOSIS — I10 Essential (primary) hypertension: Secondary | ICD-10-CM | POA: Diagnosis not present

## 2018-02-09 DIAGNOSIS — I779 Disorder of arteries and arterioles, unspecified: Secondary | ICD-10-CM | POA: Diagnosis not present

## 2018-02-10 DIAGNOSIS — I10 Essential (primary) hypertension: Secondary | ICD-10-CM | POA: Diagnosis not present

## 2018-02-10 DIAGNOSIS — I6523 Occlusion and stenosis of bilateral carotid arteries: Secondary | ICD-10-CM | POA: Diagnosis not present

## 2018-02-10 DIAGNOSIS — R296 Repeated falls: Secondary | ICD-10-CM | POA: Diagnosis not present

## 2018-02-10 DIAGNOSIS — I779 Disorder of arteries and arterioles, unspecified: Secondary | ICD-10-CM | POA: Diagnosis not present

## 2018-02-10 DIAGNOSIS — F0150 Vascular dementia without behavioral disturbance: Secondary | ICD-10-CM | POA: Diagnosis not present

## 2018-02-10 DIAGNOSIS — R410 Disorientation, unspecified: Secondary | ICD-10-CM | POA: Diagnosis not present

## 2018-02-10 DIAGNOSIS — R634 Abnormal weight loss: Secondary | ICD-10-CM | POA: Diagnosis not present

## 2018-02-10 DIAGNOSIS — Z515 Encounter for palliative care: Secondary | ICD-10-CM | POA: Diagnosis not present

## 2018-02-10 DIAGNOSIS — R451 Restlessness and agitation: Secondary | ICD-10-CM | POA: Diagnosis not present

## 2018-02-11 DIAGNOSIS — I10 Essential (primary) hypertension: Secondary | ICD-10-CM | POA: Diagnosis not present

## 2018-02-11 DIAGNOSIS — R410 Disorientation, unspecified: Secondary | ICD-10-CM | POA: Diagnosis not present

## 2018-02-11 DIAGNOSIS — F0150 Vascular dementia without behavioral disturbance: Secondary | ICD-10-CM | POA: Diagnosis not present

## 2018-02-11 DIAGNOSIS — Z515 Encounter for palliative care: Secondary | ICD-10-CM | POA: Diagnosis not present

## 2018-02-11 DIAGNOSIS — R451 Restlessness and agitation: Secondary | ICD-10-CM | POA: Diagnosis not present

## 2018-02-11 DIAGNOSIS — R634 Abnormal weight loss: Secondary | ICD-10-CM | POA: Diagnosis not present

## 2018-02-11 DIAGNOSIS — I6523 Occlusion and stenosis of bilateral carotid arteries: Secondary | ICD-10-CM | POA: Diagnosis not present

## 2018-02-11 DIAGNOSIS — I779 Disorder of arteries and arterioles, unspecified: Secondary | ICD-10-CM | POA: Diagnosis not present

## 2018-02-11 DIAGNOSIS — R296 Repeated falls: Secondary | ICD-10-CM | POA: Diagnosis not present

## 2018-02-12 DIAGNOSIS — R410 Disorientation, unspecified: Secondary | ICD-10-CM | POA: Diagnosis not present

## 2018-02-12 DIAGNOSIS — R451 Restlessness and agitation: Secondary | ICD-10-CM | POA: Diagnosis not present

## 2018-02-12 DIAGNOSIS — I6523 Occlusion and stenosis of bilateral carotid arteries: Secondary | ICD-10-CM | POA: Diagnosis not present

## 2018-02-12 DIAGNOSIS — R296 Repeated falls: Secondary | ICD-10-CM | POA: Diagnosis not present

## 2018-02-12 DIAGNOSIS — R634 Abnormal weight loss: Secondary | ICD-10-CM | POA: Diagnosis not present

## 2018-02-12 DIAGNOSIS — F0150 Vascular dementia without behavioral disturbance: Secondary | ICD-10-CM | POA: Diagnosis not present

## 2018-02-12 DIAGNOSIS — I10 Essential (primary) hypertension: Secondary | ICD-10-CM | POA: Diagnosis not present

## 2018-02-12 DIAGNOSIS — Z515 Encounter for palliative care: Secondary | ICD-10-CM | POA: Diagnosis not present

## 2018-02-12 DIAGNOSIS — I779 Disorder of arteries and arterioles, unspecified: Secondary | ICD-10-CM | POA: Diagnosis not present

## 2018-02-13 DIAGNOSIS — R451 Restlessness and agitation: Secondary | ICD-10-CM | POA: Diagnosis not present

## 2018-02-13 DIAGNOSIS — I6523 Occlusion and stenosis of bilateral carotid arteries: Secondary | ICD-10-CM | POA: Diagnosis not present

## 2018-02-13 DIAGNOSIS — R634 Abnormal weight loss: Secondary | ICD-10-CM | POA: Diagnosis not present

## 2018-02-13 DIAGNOSIS — I10 Essential (primary) hypertension: Secondary | ICD-10-CM | POA: Diagnosis not present

## 2018-02-13 DIAGNOSIS — Z515 Encounter for palliative care: Secondary | ICD-10-CM | POA: Diagnosis not present

## 2018-02-13 DIAGNOSIS — I779 Disorder of arteries and arterioles, unspecified: Secondary | ICD-10-CM | POA: Diagnosis not present

## 2018-02-13 DIAGNOSIS — R296 Repeated falls: Secondary | ICD-10-CM | POA: Diagnosis not present

## 2018-02-13 DIAGNOSIS — F0150 Vascular dementia without behavioral disturbance: Secondary | ICD-10-CM | POA: Diagnosis not present

## 2018-02-13 DIAGNOSIS — R410 Disorientation, unspecified: Secondary | ICD-10-CM | POA: Diagnosis not present

## 2018-02-14 DIAGNOSIS — I10 Essential (primary) hypertension: Secondary | ICD-10-CM | POA: Diagnosis not present

## 2018-02-14 DIAGNOSIS — I779 Disorder of arteries and arterioles, unspecified: Secondary | ICD-10-CM | POA: Diagnosis not present

## 2018-02-14 DIAGNOSIS — R296 Repeated falls: Secondary | ICD-10-CM | POA: Diagnosis not present

## 2018-02-14 DIAGNOSIS — R634 Abnormal weight loss: Secondary | ICD-10-CM | POA: Diagnosis not present

## 2018-02-14 DIAGNOSIS — Z515 Encounter for palliative care: Secondary | ICD-10-CM | POA: Diagnosis not present

## 2018-02-14 DIAGNOSIS — R451 Restlessness and agitation: Secondary | ICD-10-CM | POA: Diagnosis not present

## 2018-02-14 DIAGNOSIS — I6523 Occlusion and stenosis of bilateral carotid arteries: Secondary | ICD-10-CM | POA: Diagnosis not present

## 2018-02-14 DIAGNOSIS — F0150 Vascular dementia without behavioral disturbance: Secondary | ICD-10-CM | POA: Diagnosis not present

## 2018-02-14 DIAGNOSIS — R410 Disorientation, unspecified: Secondary | ICD-10-CM | POA: Diagnosis not present

## 2018-02-15 DIAGNOSIS — F0150 Vascular dementia without behavioral disturbance: Secondary | ICD-10-CM | POA: Diagnosis not present

## 2018-02-15 DIAGNOSIS — Z515 Encounter for palliative care: Secondary | ICD-10-CM | POA: Diagnosis not present

## 2018-02-15 DIAGNOSIS — I6523 Occlusion and stenosis of bilateral carotid arteries: Secondary | ICD-10-CM | POA: Diagnosis not present

## 2018-02-15 DIAGNOSIS — R451 Restlessness and agitation: Secondary | ICD-10-CM | POA: Diagnosis not present

## 2018-02-15 DIAGNOSIS — I779 Disorder of arteries and arterioles, unspecified: Secondary | ICD-10-CM | POA: Diagnosis not present

## 2018-02-15 DIAGNOSIS — R410 Disorientation, unspecified: Secondary | ICD-10-CM | POA: Diagnosis not present

## 2018-02-15 DIAGNOSIS — R296 Repeated falls: Secondary | ICD-10-CM | POA: Diagnosis not present

## 2018-02-15 DIAGNOSIS — I10 Essential (primary) hypertension: Secondary | ICD-10-CM | POA: Diagnosis not present

## 2018-02-15 DIAGNOSIS — R634 Abnormal weight loss: Secondary | ICD-10-CM | POA: Diagnosis not present

## 2018-02-16 DIAGNOSIS — R634 Abnormal weight loss: Secondary | ICD-10-CM | POA: Diagnosis not present

## 2018-02-16 DIAGNOSIS — F0150 Vascular dementia without behavioral disturbance: Secondary | ICD-10-CM | POA: Diagnosis not present

## 2018-02-16 DIAGNOSIS — R296 Repeated falls: Secondary | ICD-10-CM | POA: Diagnosis not present

## 2018-02-16 DIAGNOSIS — I6523 Occlusion and stenosis of bilateral carotid arteries: Secondary | ICD-10-CM | POA: Diagnosis not present

## 2018-02-16 DIAGNOSIS — Z515 Encounter for palliative care: Secondary | ICD-10-CM | POA: Diagnosis not present

## 2018-02-16 DIAGNOSIS — R410 Disorientation, unspecified: Secondary | ICD-10-CM | POA: Diagnosis not present

## 2018-02-16 DIAGNOSIS — I10 Essential (primary) hypertension: Secondary | ICD-10-CM | POA: Diagnosis not present

## 2018-02-16 DIAGNOSIS — I779 Disorder of arteries and arterioles, unspecified: Secondary | ICD-10-CM | POA: Diagnosis not present

## 2018-02-16 DIAGNOSIS — R451 Restlessness and agitation: Secondary | ICD-10-CM | POA: Diagnosis not present

## 2018-02-17 DIAGNOSIS — I779 Disorder of arteries and arterioles, unspecified: Secondary | ICD-10-CM | POA: Diagnosis not present

## 2018-02-17 DIAGNOSIS — I6523 Occlusion and stenosis of bilateral carotid arteries: Secondary | ICD-10-CM | POA: Diagnosis not present

## 2018-02-17 DIAGNOSIS — R296 Repeated falls: Secondary | ICD-10-CM | POA: Diagnosis not present

## 2018-02-17 DIAGNOSIS — R410 Disorientation, unspecified: Secondary | ICD-10-CM | POA: Diagnosis not present

## 2018-02-17 DIAGNOSIS — F0150 Vascular dementia without behavioral disturbance: Secondary | ICD-10-CM | POA: Diagnosis not present

## 2018-02-17 DIAGNOSIS — R634 Abnormal weight loss: Secondary | ICD-10-CM | POA: Diagnosis not present

## 2018-02-17 DIAGNOSIS — Z515 Encounter for palliative care: Secondary | ICD-10-CM | POA: Diagnosis not present

## 2018-02-17 DIAGNOSIS — R451 Restlessness and agitation: Secondary | ICD-10-CM | POA: Diagnosis not present

## 2018-02-17 DIAGNOSIS — I10 Essential (primary) hypertension: Secondary | ICD-10-CM | POA: Diagnosis not present

## 2018-02-18 DIAGNOSIS — I10 Essential (primary) hypertension: Secondary | ICD-10-CM | POA: Diagnosis not present

## 2018-02-18 DIAGNOSIS — R634 Abnormal weight loss: Secondary | ICD-10-CM | POA: Diagnosis not present

## 2018-02-18 DIAGNOSIS — F0150 Vascular dementia without behavioral disturbance: Secondary | ICD-10-CM | POA: Diagnosis not present

## 2018-02-18 DIAGNOSIS — Z515 Encounter for palliative care: Secondary | ICD-10-CM | POA: Diagnosis not present

## 2018-02-18 DIAGNOSIS — R410 Disorientation, unspecified: Secondary | ICD-10-CM | POA: Diagnosis not present

## 2018-02-18 DIAGNOSIS — I779 Disorder of arteries and arterioles, unspecified: Secondary | ICD-10-CM | POA: Diagnosis not present

## 2018-02-18 DIAGNOSIS — R296 Repeated falls: Secondary | ICD-10-CM | POA: Diagnosis not present

## 2018-02-18 DIAGNOSIS — I6523 Occlusion and stenosis of bilateral carotid arteries: Secondary | ICD-10-CM | POA: Diagnosis not present

## 2018-02-18 DIAGNOSIS — R451 Restlessness and agitation: Secondary | ICD-10-CM | POA: Diagnosis not present

## 2018-02-19 DIAGNOSIS — F0150 Vascular dementia without behavioral disturbance: Secondary | ICD-10-CM | POA: Diagnosis not present

## 2018-02-19 DIAGNOSIS — I6523 Occlusion and stenosis of bilateral carotid arteries: Secondary | ICD-10-CM | POA: Diagnosis not present

## 2018-02-19 DIAGNOSIS — I10 Essential (primary) hypertension: Secondary | ICD-10-CM | POA: Diagnosis not present

## 2018-02-19 DIAGNOSIS — R410 Disorientation, unspecified: Secondary | ICD-10-CM | POA: Diagnosis not present

## 2018-02-19 DIAGNOSIS — Z515 Encounter for palliative care: Secondary | ICD-10-CM | POA: Diagnosis not present

## 2018-02-19 DIAGNOSIS — I779 Disorder of arteries and arterioles, unspecified: Secondary | ICD-10-CM | POA: Diagnosis not present

## 2018-02-19 DIAGNOSIS — R634 Abnormal weight loss: Secondary | ICD-10-CM | POA: Diagnosis not present

## 2018-02-19 DIAGNOSIS — R296 Repeated falls: Secondary | ICD-10-CM | POA: Diagnosis not present

## 2018-02-19 DIAGNOSIS — R451 Restlessness and agitation: Secondary | ICD-10-CM | POA: Diagnosis not present

## 2018-02-20 DIAGNOSIS — R634 Abnormal weight loss: Secondary | ICD-10-CM | POA: Diagnosis not present

## 2018-02-20 DIAGNOSIS — F0150 Vascular dementia without behavioral disturbance: Secondary | ICD-10-CM | POA: Diagnosis not present

## 2018-02-20 DIAGNOSIS — I6523 Occlusion and stenosis of bilateral carotid arteries: Secondary | ICD-10-CM | POA: Diagnosis not present

## 2018-02-20 DIAGNOSIS — Z515 Encounter for palliative care: Secondary | ICD-10-CM | POA: Diagnosis not present

## 2018-02-20 DIAGNOSIS — I779 Disorder of arteries and arterioles, unspecified: Secondary | ICD-10-CM | POA: Diagnosis not present

## 2018-02-20 DIAGNOSIS — R410 Disorientation, unspecified: Secondary | ICD-10-CM | POA: Diagnosis not present

## 2018-02-20 DIAGNOSIS — R296 Repeated falls: Secondary | ICD-10-CM | POA: Diagnosis not present

## 2018-02-20 DIAGNOSIS — I10 Essential (primary) hypertension: Secondary | ICD-10-CM | POA: Diagnosis not present

## 2018-02-20 DIAGNOSIS — R451 Restlessness and agitation: Secondary | ICD-10-CM | POA: Diagnosis not present

## 2018-02-21 DIAGNOSIS — R296 Repeated falls: Secondary | ICD-10-CM | POA: Diagnosis not present

## 2018-02-21 DIAGNOSIS — I6523 Occlusion and stenosis of bilateral carotid arteries: Secondary | ICD-10-CM | POA: Diagnosis not present

## 2018-02-21 DIAGNOSIS — Z515 Encounter for palliative care: Secondary | ICD-10-CM | POA: Diagnosis not present

## 2018-02-21 DIAGNOSIS — R410 Disorientation, unspecified: Secondary | ICD-10-CM | POA: Diagnosis not present

## 2018-02-21 DIAGNOSIS — F0150 Vascular dementia without behavioral disturbance: Secondary | ICD-10-CM | POA: Diagnosis not present

## 2018-02-21 DIAGNOSIS — R451 Restlessness and agitation: Secondary | ICD-10-CM | POA: Diagnosis not present

## 2018-02-21 DIAGNOSIS — I779 Disorder of arteries and arterioles, unspecified: Secondary | ICD-10-CM | POA: Diagnosis not present

## 2018-02-21 DIAGNOSIS — R634 Abnormal weight loss: Secondary | ICD-10-CM | POA: Diagnosis not present

## 2018-02-21 DIAGNOSIS — I10 Essential (primary) hypertension: Secondary | ICD-10-CM | POA: Diagnosis not present

## 2018-02-22 DIAGNOSIS — I6523 Occlusion and stenosis of bilateral carotid arteries: Secondary | ICD-10-CM | POA: Diagnosis not present

## 2018-02-22 DIAGNOSIS — R410 Disorientation, unspecified: Secondary | ICD-10-CM | POA: Diagnosis not present

## 2018-02-22 DIAGNOSIS — I10 Essential (primary) hypertension: Secondary | ICD-10-CM | POA: Diagnosis not present

## 2018-02-22 DIAGNOSIS — Z515 Encounter for palliative care: Secondary | ICD-10-CM | POA: Diagnosis not present

## 2018-02-22 DIAGNOSIS — R634 Abnormal weight loss: Secondary | ICD-10-CM | POA: Diagnosis not present

## 2018-02-22 DIAGNOSIS — R451 Restlessness and agitation: Secondary | ICD-10-CM | POA: Diagnosis not present

## 2018-02-22 DIAGNOSIS — R296 Repeated falls: Secondary | ICD-10-CM | POA: Diagnosis not present

## 2018-02-22 DIAGNOSIS — I779 Disorder of arteries and arterioles, unspecified: Secondary | ICD-10-CM | POA: Diagnosis not present

## 2018-02-22 DIAGNOSIS — F0150 Vascular dementia without behavioral disturbance: Secondary | ICD-10-CM | POA: Diagnosis not present

## 2018-02-23 DIAGNOSIS — Z515 Encounter for palliative care: Secondary | ICD-10-CM | POA: Diagnosis not present

## 2018-02-23 DIAGNOSIS — I10 Essential (primary) hypertension: Secondary | ICD-10-CM | POA: Diagnosis not present

## 2018-02-23 DIAGNOSIS — I6523 Occlusion and stenosis of bilateral carotid arteries: Secondary | ICD-10-CM | POA: Diagnosis not present

## 2018-02-23 DIAGNOSIS — F0150 Vascular dementia without behavioral disturbance: Secondary | ICD-10-CM | POA: Diagnosis not present

## 2018-02-23 DIAGNOSIS — R634 Abnormal weight loss: Secondary | ICD-10-CM | POA: Diagnosis not present

## 2018-02-23 DIAGNOSIS — R296 Repeated falls: Secondary | ICD-10-CM | POA: Diagnosis not present

## 2018-02-23 DIAGNOSIS — I779 Disorder of arteries and arterioles, unspecified: Secondary | ICD-10-CM | POA: Diagnosis not present

## 2018-02-23 DIAGNOSIS — R451 Restlessness and agitation: Secondary | ICD-10-CM | POA: Diagnosis not present

## 2018-02-23 DIAGNOSIS — R410 Disorientation, unspecified: Secondary | ICD-10-CM | POA: Diagnosis not present

## 2018-02-24 DIAGNOSIS — R410 Disorientation, unspecified: Secondary | ICD-10-CM | POA: Diagnosis not present

## 2018-02-24 DIAGNOSIS — I6523 Occlusion and stenosis of bilateral carotid arteries: Secondary | ICD-10-CM | POA: Diagnosis not present

## 2018-02-24 DIAGNOSIS — F0150 Vascular dementia without behavioral disturbance: Secondary | ICD-10-CM | POA: Diagnosis not present

## 2018-02-24 DIAGNOSIS — I779 Disorder of arteries and arterioles, unspecified: Secondary | ICD-10-CM | POA: Diagnosis not present

## 2018-02-24 DIAGNOSIS — M6281 Muscle weakness (generalized): Secondary | ICD-10-CM | POA: Diagnosis not present

## 2018-02-24 DIAGNOSIS — R634 Abnormal weight loss: Secondary | ICD-10-CM | POA: Diagnosis not present

## 2018-02-24 DIAGNOSIS — I639 Cerebral infarction, unspecified: Secondary | ICD-10-CM | POA: Diagnosis not present

## 2018-02-24 DIAGNOSIS — R451 Restlessness and agitation: Secondary | ICD-10-CM | POA: Diagnosis not present

## 2018-02-24 DIAGNOSIS — I10 Essential (primary) hypertension: Secondary | ICD-10-CM | POA: Diagnosis not present

## 2018-02-24 DIAGNOSIS — F0391 Unspecified dementia with behavioral disturbance: Secondary | ICD-10-CM | POA: Diagnosis not present

## 2018-02-24 DIAGNOSIS — Z515 Encounter for palliative care: Secondary | ICD-10-CM | POA: Diagnosis not present

## 2018-02-24 DIAGNOSIS — R296 Repeated falls: Secondary | ICD-10-CM | POA: Diagnosis not present

## 2018-02-25 DIAGNOSIS — I779 Disorder of arteries and arterioles, unspecified: Secondary | ICD-10-CM | POA: Diagnosis not present

## 2018-02-25 DIAGNOSIS — R410 Disorientation, unspecified: Secondary | ICD-10-CM | POA: Diagnosis not present

## 2018-02-25 DIAGNOSIS — R634 Abnormal weight loss: Secondary | ICD-10-CM | POA: Diagnosis not present

## 2018-02-25 DIAGNOSIS — R451 Restlessness and agitation: Secondary | ICD-10-CM | POA: Diagnosis not present

## 2018-02-25 DIAGNOSIS — E785 Hyperlipidemia, unspecified: Secondary | ICD-10-CM | POA: Diagnosis not present

## 2018-02-25 DIAGNOSIS — Z515 Encounter for palliative care: Secondary | ICD-10-CM | POA: Diagnosis not present

## 2018-02-25 DIAGNOSIS — I6523 Occlusion and stenosis of bilateral carotid arteries: Secondary | ICD-10-CM | POA: Diagnosis not present

## 2018-02-25 DIAGNOSIS — F0150 Vascular dementia without behavioral disturbance: Secondary | ICD-10-CM | POA: Diagnosis not present

## 2018-02-25 DIAGNOSIS — R296 Repeated falls: Secondary | ICD-10-CM | POA: Diagnosis not present

## 2018-02-25 DIAGNOSIS — I10 Essential (primary) hypertension: Secondary | ICD-10-CM | POA: Diagnosis not present

## 2018-02-25 DIAGNOSIS — D649 Anemia, unspecified: Secondary | ICD-10-CM | POA: Diagnosis not present

## 2018-02-26 DIAGNOSIS — I6523 Occlusion and stenosis of bilateral carotid arteries: Secondary | ICD-10-CM | POA: Diagnosis not present

## 2018-02-26 DIAGNOSIS — I10 Essential (primary) hypertension: Secondary | ICD-10-CM | POA: Diagnosis not present

## 2018-02-26 DIAGNOSIS — I779 Disorder of arteries and arterioles, unspecified: Secondary | ICD-10-CM | POA: Diagnosis not present

## 2018-02-26 DIAGNOSIS — R451 Restlessness and agitation: Secondary | ICD-10-CM | POA: Diagnosis not present

## 2018-02-26 DIAGNOSIS — R296 Repeated falls: Secondary | ICD-10-CM | POA: Diagnosis not present

## 2018-02-26 DIAGNOSIS — R410 Disorientation, unspecified: Secondary | ICD-10-CM | POA: Diagnosis not present

## 2018-02-26 DIAGNOSIS — R634 Abnormal weight loss: Secondary | ICD-10-CM | POA: Diagnosis not present

## 2018-02-26 DIAGNOSIS — F0150 Vascular dementia without behavioral disturbance: Secondary | ICD-10-CM | POA: Diagnosis not present

## 2018-02-26 DIAGNOSIS — Z515 Encounter for palliative care: Secondary | ICD-10-CM | POA: Diagnosis not present

## 2018-02-27 DIAGNOSIS — I779 Disorder of arteries and arterioles, unspecified: Secondary | ICD-10-CM | POA: Diagnosis not present

## 2018-02-27 DIAGNOSIS — I6523 Occlusion and stenosis of bilateral carotid arteries: Secondary | ICD-10-CM | POA: Diagnosis not present

## 2018-02-27 DIAGNOSIS — R410 Disorientation, unspecified: Secondary | ICD-10-CM | POA: Diagnosis not present

## 2018-02-27 DIAGNOSIS — Z515 Encounter for palliative care: Secondary | ICD-10-CM | POA: Diagnosis not present

## 2018-02-27 DIAGNOSIS — F0150 Vascular dementia without behavioral disturbance: Secondary | ICD-10-CM | POA: Diagnosis not present

## 2018-02-27 DIAGNOSIS — I10 Essential (primary) hypertension: Secondary | ICD-10-CM | POA: Diagnosis not present

## 2018-02-27 DIAGNOSIS — R296 Repeated falls: Secondary | ICD-10-CM | POA: Diagnosis not present

## 2018-02-27 DIAGNOSIS — R634 Abnormal weight loss: Secondary | ICD-10-CM | POA: Diagnosis not present

## 2018-02-27 DIAGNOSIS — R451 Restlessness and agitation: Secondary | ICD-10-CM | POA: Diagnosis not present

## 2018-02-28 DIAGNOSIS — F0150 Vascular dementia without behavioral disturbance: Secondary | ICD-10-CM | POA: Diagnosis not present

## 2018-02-28 DIAGNOSIS — R634 Abnormal weight loss: Secondary | ICD-10-CM | POA: Diagnosis not present

## 2018-02-28 DIAGNOSIS — R296 Repeated falls: Secondary | ICD-10-CM | POA: Diagnosis not present

## 2018-02-28 DIAGNOSIS — I6523 Occlusion and stenosis of bilateral carotid arteries: Secondary | ICD-10-CM | POA: Diagnosis not present

## 2018-02-28 DIAGNOSIS — Z515 Encounter for palliative care: Secondary | ICD-10-CM | POA: Diagnosis not present

## 2018-02-28 DIAGNOSIS — R410 Disorientation, unspecified: Secondary | ICD-10-CM | POA: Diagnosis not present

## 2018-02-28 DIAGNOSIS — I779 Disorder of arteries and arterioles, unspecified: Secondary | ICD-10-CM | POA: Diagnosis not present

## 2018-02-28 DIAGNOSIS — I10 Essential (primary) hypertension: Secondary | ICD-10-CM | POA: Diagnosis not present

## 2018-02-28 DIAGNOSIS — R451 Restlessness and agitation: Secondary | ICD-10-CM | POA: Diagnosis not present

## 2018-03-01 DIAGNOSIS — R451 Restlessness and agitation: Secondary | ICD-10-CM | POA: Diagnosis not present

## 2018-03-01 DIAGNOSIS — Z515 Encounter for palliative care: Secondary | ICD-10-CM | POA: Diagnosis not present

## 2018-03-01 DIAGNOSIS — R634 Abnormal weight loss: Secondary | ICD-10-CM | POA: Diagnosis not present

## 2018-03-01 DIAGNOSIS — I10 Essential (primary) hypertension: Secondary | ICD-10-CM | POA: Diagnosis not present

## 2018-03-01 DIAGNOSIS — I6523 Occlusion and stenosis of bilateral carotid arteries: Secondary | ICD-10-CM | POA: Diagnosis not present

## 2018-03-01 DIAGNOSIS — F0150 Vascular dementia without behavioral disturbance: Secondary | ICD-10-CM | POA: Diagnosis not present

## 2018-03-01 DIAGNOSIS — R410 Disorientation, unspecified: Secondary | ICD-10-CM | POA: Diagnosis not present

## 2018-03-01 DIAGNOSIS — R296 Repeated falls: Secondary | ICD-10-CM | POA: Diagnosis not present

## 2018-03-01 DIAGNOSIS — I779 Disorder of arteries and arterioles, unspecified: Secondary | ICD-10-CM | POA: Diagnosis not present

## 2018-03-02 DIAGNOSIS — I10 Essential (primary) hypertension: Secondary | ICD-10-CM | POA: Diagnosis not present

## 2018-03-02 DIAGNOSIS — R410 Disorientation, unspecified: Secondary | ICD-10-CM | POA: Diagnosis not present

## 2018-03-02 DIAGNOSIS — I779 Disorder of arteries and arterioles, unspecified: Secondary | ICD-10-CM | POA: Diagnosis not present

## 2018-03-02 DIAGNOSIS — Z515 Encounter for palliative care: Secondary | ICD-10-CM | POA: Diagnosis not present

## 2018-03-02 DIAGNOSIS — I6523 Occlusion and stenosis of bilateral carotid arteries: Secondary | ICD-10-CM | POA: Diagnosis not present

## 2018-03-02 DIAGNOSIS — R296 Repeated falls: Secondary | ICD-10-CM | POA: Diagnosis not present

## 2018-03-02 DIAGNOSIS — R451 Restlessness and agitation: Secondary | ICD-10-CM | POA: Diagnosis not present

## 2018-03-02 DIAGNOSIS — R634 Abnormal weight loss: Secondary | ICD-10-CM | POA: Diagnosis not present

## 2018-03-02 DIAGNOSIS — F0150 Vascular dementia without behavioral disturbance: Secondary | ICD-10-CM | POA: Diagnosis not present

## 2018-03-03 DIAGNOSIS — I779 Disorder of arteries and arterioles, unspecified: Secondary | ICD-10-CM | POA: Diagnosis not present

## 2018-03-03 DIAGNOSIS — R410 Disorientation, unspecified: Secondary | ICD-10-CM | POA: Diagnosis not present

## 2018-03-03 DIAGNOSIS — Z515 Encounter for palliative care: Secondary | ICD-10-CM | POA: Diagnosis not present

## 2018-03-03 DIAGNOSIS — R451 Restlessness and agitation: Secondary | ICD-10-CM | POA: Diagnosis not present

## 2018-03-03 DIAGNOSIS — R634 Abnormal weight loss: Secondary | ICD-10-CM | POA: Diagnosis not present

## 2018-03-03 DIAGNOSIS — F0150 Vascular dementia without behavioral disturbance: Secondary | ICD-10-CM | POA: Diagnosis not present

## 2018-03-03 DIAGNOSIS — R296 Repeated falls: Secondary | ICD-10-CM | POA: Diagnosis not present

## 2018-03-03 DIAGNOSIS — I10 Essential (primary) hypertension: Secondary | ICD-10-CM | POA: Diagnosis not present

## 2018-03-03 DIAGNOSIS — I6523 Occlusion and stenosis of bilateral carotid arteries: Secondary | ICD-10-CM | POA: Diagnosis not present

## 2018-03-04 DIAGNOSIS — R296 Repeated falls: Secondary | ICD-10-CM | POA: Diagnosis not present

## 2018-03-04 DIAGNOSIS — R410 Disorientation, unspecified: Secondary | ICD-10-CM | POA: Diagnosis not present

## 2018-03-04 DIAGNOSIS — R634 Abnormal weight loss: Secondary | ICD-10-CM | POA: Diagnosis not present

## 2018-03-04 DIAGNOSIS — I6523 Occlusion and stenosis of bilateral carotid arteries: Secondary | ICD-10-CM | POA: Diagnosis not present

## 2018-03-04 DIAGNOSIS — I779 Disorder of arteries and arterioles, unspecified: Secondary | ICD-10-CM | POA: Diagnosis not present

## 2018-03-04 DIAGNOSIS — F0150 Vascular dementia without behavioral disturbance: Secondary | ICD-10-CM | POA: Diagnosis not present

## 2018-03-04 DIAGNOSIS — I10 Essential (primary) hypertension: Secondary | ICD-10-CM | POA: Diagnosis not present

## 2018-03-04 DIAGNOSIS — Z515 Encounter for palliative care: Secondary | ICD-10-CM | POA: Diagnosis not present

## 2018-03-04 DIAGNOSIS — R451 Restlessness and agitation: Secondary | ICD-10-CM | POA: Diagnosis not present

## 2018-03-05 DIAGNOSIS — I6523 Occlusion and stenosis of bilateral carotid arteries: Secondary | ICD-10-CM | POA: Diagnosis not present

## 2018-03-05 DIAGNOSIS — Z515 Encounter for palliative care: Secondary | ICD-10-CM | POA: Diagnosis not present

## 2018-03-05 DIAGNOSIS — R634 Abnormal weight loss: Secondary | ICD-10-CM | POA: Diagnosis not present

## 2018-03-05 DIAGNOSIS — R296 Repeated falls: Secondary | ICD-10-CM | POA: Diagnosis not present

## 2018-03-05 DIAGNOSIS — R410 Disorientation, unspecified: Secondary | ICD-10-CM | POA: Diagnosis not present

## 2018-03-05 DIAGNOSIS — I10 Essential (primary) hypertension: Secondary | ICD-10-CM | POA: Diagnosis not present

## 2018-03-05 DIAGNOSIS — R451 Restlessness and agitation: Secondary | ICD-10-CM | POA: Diagnosis not present

## 2018-03-05 DIAGNOSIS — F0150 Vascular dementia without behavioral disturbance: Secondary | ICD-10-CM | POA: Diagnosis not present

## 2018-03-05 DIAGNOSIS — I779 Disorder of arteries and arterioles, unspecified: Secondary | ICD-10-CM | POA: Diagnosis not present

## 2018-03-06 DIAGNOSIS — I10 Essential (primary) hypertension: Secondary | ICD-10-CM | POA: Diagnosis not present

## 2018-03-06 DIAGNOSIS — R296 Repeated falls: Secondary | ICD-10-CM | POA: Diagnosis not present

## 2018-03-06 DIAGNOSIS — R634 Abnormal weight loss: Secondary | ICD-10-CM | POA: Diagnosis not present

## 2018-03-06 DIAGNOSIS — I6523 Occlusion and stenosis of bilateral carotid arteries: Secondary | ICD-10-CM | POA: Diagnosis not present

## 2018-03-06 DIAGNOSIS — F0150 Vascular dementia without behavioral disturbance: Secondary | ICD-10-CM | POA: Diagnosis not present

## 2018-03-06 DIAGNOSIS — I779 Disorder of arteries and arterioles, unspecified: Secondary | ICD-10-CM | POA: Diagnosis not present

## 2018-03-06 DIAGNOSIS — R410 Disorientation, unspecified: Secondary | ICD-10-CM | POA: Diagnosis not present

## 2018-03-06 DIAGNOSIS — Z515 Encounter for palliative care: Secondary | ICD-10-CM | POA: Diagnosis not present

## 2018-03-06 DIAGNOSIS — R451 Restlessness and agitation: Secondary | ICD-10-CM | POA: Diagnosis not present

## 2018-03-07 DIAGNOSIS — I10 Essential (primary) hypertension: Secondary | ICD-10-CM | POA: Diagnosis not present

## 2018-03-07 DIAGNOSIS — I779 Disorder of arteries and arterioles, unspecified: Secondary | ICD-10-CM | POA: Diagnosis not present

## 2018-03-07 DIAGNOSIS — I6523 Occlusion and stenosis of bilateral carotid arteries: Secondary | ICD-10-CM | POA: Diagnosis not present

## 2018-03-07 DIAGNOSIS — R451 Restlessness and agitation: Secondary | ICD-10-CM | POA: Diagnosis not present

## 2018-03-07 DIAGNOSIS — F0150 Vascular dementia without behavioral disturbance: Secondary | ICD-10-CM | POA: Diagnosis not present

## 2018-03-07 DIAGNOSIS — R410 Disorientation, unspecified: Secondary | ICD-10-CM | POA: Diagnosis not present

## 2018-03-07 DIAGNOSIS — R634 Abnormal weight loss: Secondary | ICD-10-CM | POA: Diagnosis not present

## 2018-03-07 DIAGNOSIS — Z515 Encounter for palliative care: Secondary | ICD-10-CM | POA: Diagnosis not present

## 2018-03-07 DIAGNOSIS — R296 Repeated falls: Secondary | ICD-10-CM | POA: Diagnosis not present

## 2018-03-08 DIAGNOSIS — R634 Abnormal weight loss: Secondary | ICD-10-CM | POA: Diagnosis not present

## 2018-03-08 DIAGNOSIS — I6523 Occlusion and stenosis of bilateral carotid arteries: Secondary | ICD-10-CM | POA: Diagnosis not present

## 2018-03-08 DIAGNOSIS — I779 Disorder of arteries and arterioles, unspecified: Secondary | ICD-10-CM | POA: Diagnosis not present

## 2018-03-08 DIAGNOSIS — R410 Disorientation, unspecified: Secondary | ICD-10-CM | POA: Diagnosis not present

## 2018-03-08 DIAGNOSIS — I10 Essential (primary) hypertension: Secondary | ICD-10-CM | POA: Diagnosis not present

## 2018-03-08 DIAGNOSIS — Z515 Encounter for palliative care: Secondary | ICD-10-CM | POA: Diagnosis not present

## 2018-03-08 DIAGNOSIS — R451 Restlessness and agitation: Secondary | ICD-10-CM | POA: Diagnosis not present

## 2018-03-08 DIAGNOSIS — F0150 Vascular dementia without behavioral disturbance: Secondary | ICD-10-CM | POA: Diagnosis not present

## 2018-03-08 DIAGNOSIS — R296 Repeated falls: Secondary | ICD-10-CM | POA: Diagnosis not present

## 2018-03-09 DIAGNOSIS — I779 Disorder of arteries and arterioles, unspecified: Secondary | ICD-10-CM | POA: Diagnosis not present

## 2018-03-09 DIAGNOSIS — F0150 Vascular dementia without behavioral disturbance: Secondary | ICD-10-CM | POA: Diagnosis not present

## 2018-03-09 DIAGNOSIS — I6523 Occlusion and stenosis of bilateral carotid arteries: Secondary | ICD-10-CM | POA: Diagnosis not present

## 2018-03-09 DIAGNOSIS — Z515 Encounter for palliative care: Secondary | ICD-10-CM | POA: Diagnosis not present

## 2018-03-09 DIAGNOSIS — I10 Essential (primary) hypertension: Secondary | ICD-10-CM | POA: Diagnosis not present

## 2018-03-09 DIAGNOSIS — R410 Disorientation, unspecified: Secondary | ICD-10-CM | POA: Diagnosis not present

## 2018-03-09 DIAGNOSIS — R451 Restlessness and agitation: Secondary | ICD-10-CM | POA: Diagnosis not present

## 2018-03-09 DIAGNOSIS — R296 Repeated falls: Secondary | ICD-10-CM | POA: Diagnosis not present

## 2018-03-09 DIAGNOSIS — R634 Abnormal weight loss: Secondary | ICD-10-CM | POA: Diagnosis not present

## 2018-03-10 DIAGNOSIS — F0150 Vascular dementia without behavioral disturbance: Secondary | ICD-10-CM | POA: Diagnosis not present

## 2018-03-10 DIAGNOSIS — R410 Disorientation, unspecified: Secondary | ICD-10-CM | POA: Diagnosis not present

## 2018-03-10 DIAGNOSIS — I10 Essential (primary) hypertension: Secondary | ICD-10-CM | POA: Diagnosis not present

## 2018-03-10 DIAGNOSIS — R451 Restlessness and agitation: Secondary | ICD-10-CM | POA: Diagnosis not present

## 2018-03-10 DIAGNOSIS — R634 Abnormal weight loss: Secondary | ICD-10-CM | POA: Diagnosis not present

## 2018-03-10 DIAGNOSIS — R296 Repeated falls: Secondary | ICD-10-CM | POA: Diagnosis not present

## 2018-03-10 DIAGNOSIS — Z515 Encounter for palliative care: Secondary | ICD-10-CM | POA: Diagnosis not present

## 2018-03-10 DIAGNOSIS — I779 Disorder of arteries and arterioles, unspecified: Secondary | ICD-10-CM | POA: Diagnosis not present

## 2018-03-10 DIAGNOSIS — I6523 Occlusion and stenosis of bilateral carotid arteries: Secondary | ICD-10-CM | POA: Diagnosis not present

## 2018-03-11 DIAGNOSIS — R634 Abnormal weight loss: Secondary | ICD-10-CM | POA: Diagnosis not present

## 2018-03-11 DIAGNOSIS — I779 Disorder of arteries and arterioles, unspecified: Secondary | ICD-10-CM | POA: Diagnosis not present

## 2018-03-11 DIAGNOSIS — R410 Disorientation, unspecified: Secondary | ICD-10-CM | POA: Diagnosis not present

## 2018-03-11 DIAGNOSIS — F0150 Vascular dementia without behavioral disturbance: Secondary | ICD-10-CM | POA: Diagnosis not present

## 2018-03-11 DIAGNOSIS — I6523 Occlusion and stenosis of bilateral carotid arteries: Secondary | ICD-10-CM | POA: Diagnosis not present

## 2018-03-11 DIAGNOSIS — I10 Essential (primary) hypertension: Secondary | ICD-10-CM | POA: Diagnosis not present

## 2018-03-11 DIAGNOSIS — R296 Repeated falls: Secondary | ICD-10-CM | POA: Diagnosis not present

## 2018-03-11 DIAGNOSIS — R451 Restlessness and agitation: Secondary | ICD-10-CM | POA: Diagnosis not present

## 2018-03-11 DIAGNOSIS — Z515 Encounter for palliative care: Secondary | ICD-10-CM | POA: Diagnosis not present

## 2018-03-12 DIAGNOSIS — I6523 Occlusion and stenosis of bilateral carotid arteries: Secondary | ICD-10-CM | POA: Diagnosis not present

## 2018-03-12 DIAGNOSIS — R296 Repeated falls: Secondary | ICD-10-CM | POA: Diagnosis not present

## 2018-03-12 DIAGNOSIS — R634 Abnormal weight loss: Secondary | ICD-10-CM | POA: Diagnosis not present

## 2018-03-12 DIAGNOSIS — I10 Essential (primary) hypertension: Secondary | ICD-10-CM | POA: Diagnosis not present

## 2018-03-12 DIAGNOSIS — R451 Restlessness and agitation: Secondary | ICD-10-CM | POA: Diagnosis not present

## 2018-03-12 DIAGNOSIS — F0150 Vascular dementia without behavioral disturbance: Secondary | ICD-10-CM | POA: Diagnosis not present

## 2018-03-12 DIAGNOSIS — R410 Disorientation, unspecified: Secondary | ICD-10-CM | POA: Diagnosis not present

## 2018-03-12 DIAGNOSIS — I779 Disorder of arteries and arterioles, unspecified: Secondary | ICD-10-CM | POA: Diagnosis not present

## 2018-03-12 DIAGNOSIS — Z515 Encounter for palliative care: Secondary | ICD-10-CM | POA: Diagnosis not present

## 2018-03-13 DIAGNOSIS — I10 Essential (primary) hypertension: Secondary | ICD-10-CM | POA: Diagnosis not present

## 2018-03-13 DIAGNOSIS — F0150 Vascular dementia without behavioral disturbance: Secondary | ICD-10-CM | POA: Diagnosis not present

## 2018-03-13 DIAGNOSIS — R451 Restlessness and agitation: Secondary | ICD-10-CM | POA: Diagnosis not present

## 2018-03-13 DIAGNOSIS — I6523 Occlusion and stenosis of bilateral carotid arteries: Secondary | ICD-10-CM | POA: Diagnosis not present

## 2018-03-13 DIAGNOSIS — F0390 Unspecified dementia without behavioral disturbance: Secondary | ICD-10-CM | POA: Diagnosis not present

## 2018-03-13 DIAGNOSIS — Z515 Encounter for palliative care: Secondary | ICD-10-CM | POA: Diagnosis not present

## 2018-03-13 DIAGNOSIS — R410 Disorientation, unspecified: Secondary | ICD-10-CM | POA: Diagnosis not present

## 2018-03-13 DIAGNOSIS — R634 Abnormal weight loss: Secondary | ICD-10-CM | POA: Diagnosis not present

## 2018-03-13 DIAGNOSIS — I779 Disorder of arteries and arterioles, unspecified: Secondary | ICD-10-CM | POA: Diagnosis not present

## 2018-03-13 DIAGNOSIS — R296 Repeated falls: Secondary | ICD-10-CM | POA: Diagnosis not present

## 2018-03-14 DIAGNOSIS — I779 Disorder of arteries and arterioles, unspecified: Secondary | ICD-10-CM | POA: Diagnosis not present

## 2018-03-14 DIAGNOSIS — Z515 Encounter for palliative care: Secondary | ICD-10-CM | POA: Diagnosis not present

## 2018-03-14 DIAGNOSIS — R296 Repeated falls: Secondary | ICD-10-CM | POA: Diagnosis not present

## 2018-03-14 DIAGNOSIS — F0150 Vascular dementia without behavioral disturbance: Secondary | ICD-10-CM | POA: Diagnosis not present

## 2018-03-14 DIAGNOSIS — I6523 Occlusion and stenosis of bilateral carotid arteries: Secondary | ICD-10-CM | POA: Diagnosis not present

## 2018-03-14 DIAGNOSIS — R451 Restlessness and agitation: Secondary | ICD-10-CM | POA: Diagnosis not present

## 2018-03-14 DIAGNOSIS — R410 Disorientation, unspecified: Secondary | ICD-10-CM | POA: Diagnosis not present

## 2018-03-14 DIAGNOSIS — I10 Essential (primary) hypertension: Secondary | ICD-10-CM | POA: Diagnosis not present

## 2018-03-14 DIAGNOSIS — R634 Abnormal weight loss: Secondary | ICD-10-CM | POA: Diagnosis not present

## 2018-03-15 DIAGNOSIS — F0150 Vascular dementia without behavioral disturbance: Secondary | ICD-10-CM | POA: Diagnosis not present

## 2018-03-15 DIAGNOSIS — R296 Repeated falls: Secondary | ICD-10-CM | POA: Diagnosis not present

## 2018-03-15 DIAGNOSIS — I10 Essential (primary) hypertension: Secondary | ICD-10-CM | POA: Diagnosis not present

## 2018-03-15 DIAGNOSIS — I6523 Occlusion and stenosis of bilateral carotid arteries: Secondary | ICD-10-CM | POA: Diagnosis not present

## 2018-03-15 DIAGNOSIS — R410 Disorientation, unspecified: Secondary | ICD-10-CM | POA: Diagnosis not present

## 2018-03-15 DIAGNOSIS — Z515 Encounter for palliative care: Secondary | ICD-10-CM | POA: Diagnosis not present

## 2018-03-15 DIAGNOSIS — R451 Restlessness and agitation: Secondary | ICD-10-CM | POA: Diagnosis not present

## 2018-03-15 DIAGNOSIS — I779 Disorder of arteries and arterioles, unspecified: Secondary | ICD-10-CM | POA: Diagnosis not present

## 2018-03-15 DIAGNOSIS — R634 Abnormal weight loss: Secondary | ICD-10-CM | POA: Diagnosis not present

## 2018-03-16 DIAGNOSIS — I779 Disorder of arteries and arterioles, unspecified: Secondary | ICD-10-CM | POA: Diagnosis not present

## 2018-03-16 DIAGNOSIS — I10 Essential (primary) hypertension: Secondary | ICD-10-CM | POA: Diagnosis not present

## 2018-03-16 DIAGNOSIS — R451 Restlessness and agitation: Secondary | ICD-10-CM | POA: Diagnosis not present

## 2018-03-16 DIAGNOSIS — R634 Abnormal weight loss: Secondary | ICD-10-CM | POA: Diagnosis not present

## 2018-03-16 DIAGNOSIS — R410 Disorientation, unspecified: Secondary | ICD-10-CM | POA: Diagnosis not present

## 2018-03-16 DIAGNOSIS — I6523 Occlusion and stenosis of bilateral carotid arteries: Secondary | ICD-10-CM | POA: Diagnosis not present

## 2018-03-16 DIAGNOSIS — R296 Repeated falls: Secondary | ICD-10-CM | POA: Diagnosis not present

## 2018-03-16 DIAGNOSIS — Z515 Encounter for palliative care: Secondary | ICD-10-CM | POA: Diagnosis not present

## 2018-03-16 DIAGNOSIS — F0150 Vascular dementia without behavioral disturbance: Secondary | ICD-10-CM | POA: Diagnosis not present

## 2018-03-17 DIAGNOSIS — Z515 Encounter for palliative care: Secondary | ICD-10-CM | POA: Diagnosis not present

## 2018-03-17 DIAGNOSIS — R634 Abnormal weight loss: Secondary | ICD-10-CM | POA: Diagnosis not present

## 2018-03-17 DIAGNOSIS — R410 Disorientation, unspecified: Secondary | ICD-10-CM | POA: Diagnosis not present

## 2018-03-17 DIAGNOSIS — R451 Restlessness and agitation: Secondary | ICD-10-CM | POA: Diagnosis not present

## 2018-03-17 DIAGNOSIS — I6523 Occlusion and stenosis of bilateral carotid arteries: Secondary | ICD-10-CM | POA: Diagnosis not present

## 2018-03-17 DIAGNOSIS — I779 Disorder of arteries and arterioles, unspecified: Secondary | ICD-10-CM | POA: Diagnosis not present

## 2018-03-17 DIAGNOSIS — F0150 Vascular dementia without behavioral disturbance: Secondary | ICD-10-CM | POA: Diagnosis not present

## 2018-03-17 DIAGNOSIS — I10 Essential (primary) hypertension: Secondary | ICD-10-CM | POA: Diagnosis not present

## 2018-03-17 DIAGNOSIS — R296 Repeated falls: Secondary | ICD-10-CM | POA: Diagnosis not present

## 2018-03-18 DIAGNOSIS — R410 Disorientation, unspecified: Secondary | ICD-10-CM | POA: Diagnosis not present

## 2018-03-18 DIAGNOSIS — I10 Essential (primary) hypertension: Secondary | ICD-10-CM | POA: Diagnosis not present

## 2018-03-18 DIAGNOSIS — F0150 Vascular dementia without behavioral disturbance: Secondary | ICD-10-CM | POA: Diagnosis not present

## 2018-03-18 DIAGNOSIS — R296 Repeated falls: Secondary | ICD-10-CM | POA: Diagnosis not present

## 2018-03-18 DIAGNOSIS — I6523 Occlusion and stenosis of bilateral carotid arteries: Secondary | ICD-10-CM | POA: Diagnosis not present

## 2018-03-18 DIAGNOSIS — I779 Disorder of arteries and arterioles, unspecified: Secondary | ICD-10-CM | POA: Diagnosis not present

## 2018-03-18 DIAGNOSIS — Z515 Encounter for palliative care: Secondary | ICD-10-CM | POA: Diagnosis not present

## 2018-03-18 DIAGNOSIS — R451 Restlessness and agitation: Secondary | ICD-10-CM | POA: Diagnosis not present

## 2018-03-18 DIAGNOSIS — R634 Abnormal weight loss: Secondary | ICD-10-CM | POA: Diagnosis not present

## 2018-03-19 DIAGNOSIS — R296 Repeated falls: Secondary | ICD-10-CM | POA: Diagnosis not present

## 2018-03-19 DIAGNOSIS — F0150 Vascular dementia without behavioral disturbance: Secondary | ICD-10-CM | POA: Diagnosis not present

## 2018-03-19 DIAGNOSIS — I10 Essential (primary) hypertension: Secondary | ICD-10-CM | POA: Diagnosis not present

## 2018-03-19 DIAGNOSIS — R451 Restlessness and agitation: Secondary | ICD-10-CM | POA: Diagnosis not present

## 2018-03-19 DIAGNOSIS — R410 Disorientation, unspecified: Secondary | ICD-10-CM | POA: Diagnosis not present

## 2018-03-19 DIAGNOSIS — I6523 Occlusion and stenosis of bilateral carotid arteries: Secondary | ICD-10-CM | POA: Diagnosis not present

## 2018-03-19 DIAGNOSIS — I779 Disorder of arteries and arterioles, unspecified: Secondary | ICD-10-CM | POA: Diagnosis not present

## 2018-03-19 DIAGNOSIS — Z515 Encounter for palliative care: Secondary | ICD-10-CM | POA: Diagnosis not present

## 2018-03-19 DIAGNOSIS — R634 Abnormal weight loss: Secondary | ICD-10-CM | POA: Diagnosis not present

## 2018-03-20 DIAGNOSIS — R634 Abnormal weight loss: Secondary | ICD-10-CM | POA: Diagnosis not present

## 2018-03-20 DIAGNOSIS — R410 Disorientation, unspecified: Secondary | ICD-10-CM | POA: Diagnosis not present

## 2018-03-20 DIAGNOSIS — Z515 Encounter for palliative care: Secondary | ICD-10-CM | POA: Diagnosis not present

## 2018-03-20 DIAGNOSIS — I779 Disorder of arteries and arterioles, unspecified: Secondary | ICD-10-CM | POA: Diagnosis not present

## 2018-03-20 DIAGNOSIS — F0150 Vascular dementia without behavioral disturbance: Secondary | ICD-10-CM | POA: Diagnosis not present

## 2018-03-20 DIAGNOSIS — I10 Essential (primary) hypertension: Secondary | ICD-10-CM | POA: Diagnosis not present

## 2018-03-20 DIAGNOSIS — I6523 Occlusion and stenosis of bilateral carotid arteries: Secondary | ICD-10-CM | POA: Diagnosis not present

## 2018-03-20 DIAGNOSIS — R451 Restlessness and agitation: Secondary | ICD-10-CM | POA: Diagnosis not present

## 2018-03-20 DIAGNOSIS — R296 Repeated falls: Secondary | ICD-10-CM | POA: Diagnosis not present

## 2018-03-21 DIAGNOSIS — R451 Restlessness and agitation: Secondary | ICD-10-CM | POA: Diagnosis not present

## 2018-03-21 DIAGNOSIS — I10 Essential (primary) hypertension: Secondary | ICD-10-CM | POA: Diagnosis not present

## 2018-03-21 DIAGNOSIS — R296 Repeated falls: Secondary | ICD-10-CM | POA: Diagnosis not present

## 2018-03-21 DIAGNOSIS — R634 Abnormal weight loss: Secondary | ICD-10-CM | POA: Diagnosis not present

## 2018-03-21 DIAGNOSIS — I779 Disorder of arteries and arterioles, unspecified: Secondary | ICD-10-CM | POA: Diagnosis not present

## 2018-03-21 DIAGNOSIS — I6523 Occlusion and stenosis of bilateral carotid arteries: Secondary | ICD-10-CM | POA: Diagnosis not present

## 2018-03-21 DIAGNOSIS — F0150 Vascular dementia without behavioral disturbance: Secondary | ICD-10-CM | POA: Diagnosis not present

## 2018-03-21 DIAGNOSIS — Z515 Encounter for palliative care: Secondary | ICD-10-CM | POA: Diagnosis not present

## 2018-03-21 DIAGNOSIS — R410 Disorientation, unspecified: Secondary | ICD-10-CM | POA: Diagnosis not present

## 2018-03-22 DIAGNOSIS — R451 Restlessness and agitation: Secondary | ICD-10-CM | POA: Diagnosis not present

## 2018-03-22 DIAGNOSIS — R634 Abnormal weight loss: Secondary | ICD-10-CM | POA: Diagnosis not present

## 2018-03-22 DIAGNOSIS — F0150 Vascular dementia without behavioral disturbance: Secondary | ICD-10-CM | POA: Diagnosis not present

## 2018-03-22 DIAGNOSIS — R410 Disorientation, unspecified: Secondary | ICD-10-CM | POA: Diagnosis not present

## 2018-03-22 DIAGNOSIS — R296 Repeated falls: Secondary | ICD-10-CM | POA: Diagnosis not present

## 2018-03-22 DIAGNOSIS — I6523 Occlusion and stenosis of bilateral carotid arteries: Secondary | ICD-10-CM | POA: Diagnosis not present

## 2018-03-22 DIAGNOSIS — Z515 Encounter for palliative care: Secondary | ICD-10-CM | POA: Diagnosis not present

## 2018-03-22 DIAGNOSIS — I779 Disorder of arteries and arterioles, unspecified: Secondary | ICD-10-CM | POA: Diagnosis not present

## 2018-03-22 DIAGNOSIS — I10 Essential (primary) hypertension: Secondary | ICD-10-CM | POA: Diagnosis not present

## 2018-03-23 DIAGNOSIS — F0150 Vascular dementia without behavioral disturbance: Secondary | ICD-10-CM | POA: Diagnosis not present

## 2018-03-23 DIAGNOSIS — R634 Abnormal weight loss: Secondary | ICD-10-CM | POA: Diagnosis not present

## 2018-03-23 DIAGNOSIS — I779 Disorder of arteries and arterioles, unspecified: Secondary | ICD-10-CM | POA: Diagnosis not present

## 2018-03-23 DIAGNOSIS — R451 Restlessness and agitation: Secondary | ICD-10-CM | POA: Diagnosis not present

## 2018-03-23 DIAGNOSIS — Z515 Encounter for palliative care: Secondary | ICD-10-CM | POA: Diagnosis not present

## 2018-03-23 DIAGNOSIS — I6523 Occlusion and stenosis of bilateral carotid arteries: Secondary | ICD-10-CM | POA: Diagnosis not present

## 2018-03-23 DIAGNOSIS — R296 Repeated falls: Secondary | ICD-10-CM | POA: Diagnosis not present

## 2018-03-23 DIAGNOSIS — I10 Essential (primary) hypertension: Secondary | ICD-10-CM | POA: Diagnosis not present

## 2018-03-23 DIAGNOSIS — R410 Disorientation, unspecified: Secondary | ICD-10-CM | POA: Diagnosis not present

## 2018-03-24 DIAGNOSIS — I639 Cerebral infarction, unspecified: Secondary | ICD-10-CM | POA: Diagnosis not present

## 2018-03-24 DIAGNOSIS — K5900 Constipation, unspecified: Secondary | ICD-10-CM | POA: Diagnosis not present

## 2018-03-24 DIAGNOSIS — I779 Disorder of arteries and arterioles, unspecified: Secondary | ICD-10-CM | POA: Diagnosis not present

## 2018-03-24 DIAGNOSIS — R634 Abnormal weight loss: Secondary | ICD-10-CM | POA: Diagnosis not present

## 2018-03-24 DIAGNOSIS — Z515 Encounter for palliative care: Secondary | ICD-10-CM | POA: Diagnosis not present

## 2018-03-24 DIAGNOSIS — I10 Essential (primary) hypertension: Secondary | ICD-10-CM | POA: Diagnosis not present

## 2018-03-24 DIAGNOSIS — I6523 Occlusion and stenosis of bilateral carotid arteries: Secondary | ICD-10-CM | POA: Diagnosis not present

## 2018-03-24 DIAGNOSIS — F0150 Vascular dementia without behavioral disturbance: Secondary | ICD-10-CM | POA: Diagnosis not present

## 2018-03-24 DIAGNOSIS — R296 Repeated falls: Secondary | ICD-10-CM | POA: Diagnosis not present

## 2018-03-24 DIAGNOSIS — R410 Disorientation, unspecified: Secondary | ICD-10-CM | POA: Diagnosis not present

## 2018-03-24 DIAGNOSIS — M6281 Muscle weakness (generalized): Secondary | ICD-10-CM | POA: Diagnosis not present

## 2018-03-24 DIAGNOSIS — R451 Restlessness and agitation: Secondary | ICD-10-CM | POA: Diagnosis not present

## 2018-03-25 DIAGNOSIS — R451 Restlessness and agitation: Secondary | ICD-10-CM | POA: Diagnosis not present

## 2018-03-25 DIAGNOSIS — R634 Abnormal weight loss: Secondary | ICD-10-CM | POA: Diagnosis not present

## 2018-03-25 DIAGNOSIS — I6523 Occlusion and stenosis of bilateral carotid arteries: Secondary | ICD-10-CM | POA: Diagnosis not present

## 2018-03-25 DIAGNOSIS — R410 Disorientation, unspecified: Secondary | ICD-10-CM | POA: Diagnosis not present

## 2018-03-25 DIAGNOSIS — I779 Disorder of arteries and arterioles, unspecified: Secondary | ICD-10-CM | POA: Diagnosis not present

## 2018-03-25 DIAGNOSIS — R296 Repeated falls: Secondary | ICD-10-CM | POA: Diagnosis not present

## 2018-03-25 DIAGNOSIS — I10 Essential (primary) hypertension: Secondary | ICD-10-CM | POA: Diagnosis not present

## 2018-03-25 DIAGNOSIS — F0150 Vascular dementia without behavioral disturbance: Secondary | ICD-10-CM | POA: Diagnosis not present

## 2018-03-25 DIAGNOSIS — Z515 Encounter for palliative care: Secondary | ICD-10-CM | POA: Diagnosis not present

## 2018-03-26 DIAGNOSIS — F0150 Vascular dementia without behavioral disturbance: Secondary | ICD-10-CM | POA: Diagnosis not present

## 2018-03-26 DIAGNOSIS — R296 Repeated falls: Secondary | ICD-10-CM | POA: Diagnosis not present

## 2018-03-26 DIAGNOSIS — R451 Restlessness and agitation: Secondary | ICD-10-CM | POA: Diagnosis not present

## 2018-03-26 DIAGNOSIS — I6523 Occlusion and stenosis of bilateral carotid arteries: Secondary | ICD-10-CM | POA: Diagnosis not present

## 2018-03-26 DIAGNOSIS — R410 Disorientation, unspecified: Secondary | ICD-10-CM | POA: Diagnosis not present

## 2018-03-26 DIAGNOSIS — R634 Abnormal weight loss: Secondary | ICD-10-CM | POA: Diagnosis not present

## 2018-03-26 DIAGNOSIS — I779 Disorder of arteries and arterioles, unspecified: Secondary | ICD-10-CM | POA: Diagnosis not present

## 2018-03-26 DIAGNOSIS — I10 Essential (primary) hypertension: Secondary | ICD-10-CM | POA: Diagnosis not present

## 2018-03-26 DIAGNOSIS — Z515 Encounter for palliative care: Secondary | ICD-10-CM | POA: Diagnosis not present

## 2018-03-27 DIAGNOSIS — R634 Abnormal weight loss: Secondary | ICD-10-CM | POA: Diagnosis not present

## 2018-03-27 DIAGNOSIS — R296 Repeated falls: Secondary | ICD-10-CM | POA: Diagnosis not present

## 2018-03-27 DIAGNOSIS — R410 Disorientation, unspecified: Secondary | ICD-10-CM | POA: Diagnosis not present

## 2018-03-27 DIAGNOSIS — I779 Disorder of arteries and arterioles, unspecified: Secondary | ICD-10-CM | POA: Diagnosis not present

## 2018-03-27 DIAGNOSIS — F0150 Vascular dementia without behavioral disturbance: Secondary | ICD-10-CM | POA: Diagnosis not present

## 2018-03-27 DIAGNOSIS — Z515 Encounter for palliative care: Secondary | ICD-10-CM | POA: Diagnosis not present

## 2018-03-27 DIAGNOSIS — R451 Restlessness and agitation: Secondary | ICD-10-CM | POA: Diagnosis not present

## 2018-03-27 DIAGNOSIS — I6523 Occlusion and stenosis of bilateral carotid arteries: Secondary | ICD-10-CM | POA: Diagnosis not present

## 2018-03-27 DIAGNOSIS — I10 Essential (primary) hypertension: Secondary | ICD-10-CM | POA: Diagnosis not present

## 2018-03-28 DIAGNOSIS — I779 Disorder of arteries and arterioles, unspecified: Secondary | ICD-10-CM | POA: Diagnosis not present

## 2018-03-28 DIAGNOSIS — R634 Abnormal weight loss: Secondary | ICD-10-CM | POA: Diagnosis not present

## 2018-03-28 DIAGNOSIS — R410 Disorientation, unspecified: Secondary | ICD-10-CM | POA: Diagnosis not present

## 2018-03-28 DIAGNOSIS — F0150 Vascular dementia without behavioral disturbance: Secondary | ICD-10-CM | POA: Diagnosis not present

## 2018-03-28 DIAGNOSIS — R451 Restlessness and agitation: Secondary | ICD-10-CM | POA: Diagnosis not present

## 2018-03-28 DIAGNOSIS — R296 Repeated falls: Secondary | ICD-10-CM | POA: Diagnosis not present

## 2018-03-28 DIAGNOSIS — I10 Essential (primary) hypertension: Secondary | ICD-10-CM | POA: Diagnosis not present

## 2018-03-28 DIAGNOSIS — Z515 Encounter for palliative care: Secondary | ICD-10-CM | POA: Diagnosis not present

## 2018-03-28 DIAGNOSIS — I6523 Occlusion and stenosis of bilateral carotid arteries: Secondary | ICD-10-CM | POA: Diagnosis not present

## 2018-03-29 DIAGNOSIS — R296 Repeated falls: Secondary | ICD-10-CM | POA: Diagnosis not present

## 2018-03-29 DIAGNOSIS — R634 Abnormal weight loss: Secondary | ICD-10-CM | POA: Diagnosis not present

## 2018-03-29 DIAGNOSIS — F0150 Vascular dementia without behavioral disturbance: Secondary | ICD-10-CM | POA: Diagnosis not present

## 2018-03-29 DIAGNOSIS — R451 Restlessness and agitation: Secondary | ICD-10-CM | POA: Diagnosis not present

## 2018-03-29 DIAGNOSIS — R410 Disorientation, unspecified: Secondary | ICD-10-CM | POA: Diagnosis not present

## 2018-03-29 DIAGNOSIS — I6523 Occlusion and stenosis of bilateral carotid arteries: Secondary | ICD-10-CM | POA: Diagnosis not present

## 2018-03-29 DIAGNOSIS — I779 Disorder of arteries and arterioles, unspecified: Secondary | ICD-10-CM | POA: Diagnosis not present

## 2018-03-29 DIAGNOSIS — Z515 Encounter for palliative care: Secondary | ICD-10-CM | POA: Diagnosis not present

## 2018-03-29 DIAGNOSIS — I10 Essential (primary) hypertension: Secondary | ICD-10-CM | POA: Diagnosis not present

## 2018-03-30 DIAGNOSIS — R410 Disorientation, unspecified: Secondary | ICD-10-CM | POA: Diagnosis not present

## 2018-03-30 DIAGNOSIS — Z515 Encounter for palliative care: Secondary | ICD-10-CM | POA: Diagnosis not present

## 2018-03-30 DIAGNOSIS — I10 Essential (primary) hypertension: Secondary | ICD-10-CM | POA: Diagnosis not present

## 2018-03-30 DIAGNOSIS — R451 Restlessness and agitation: Secondary | ICD-10-CM | POA: Diagnosis not present

## 2018-03-30 DIAGNOSIS — R634 Abnormal weight loss: Secondary | ICD-10-CM | POA: Diagnosis not present

## 2018-03-30 DIAGNOSIS — I6523 Occlusion and stenosis of bilateral carotid arteries: Secondary | ICD-10-CM | POA: Diagnosis not present

## 2018-03-30 DIAGNOSIS — I779 Disorder of arteries and arterioles, unspecified: Secondary | ICD-10-CM | POA: Diagnosis not present

## 2018-03-30 DIAGNOSIS — F0150 Vascular dementia without behavioral disturbance: Secondary | ICD-10-CM | POA: Diagnosis not present

## 2018-03-30 DIAGNOSIS — R296 Repeated falls: Secondary | ICD-10-CM | POA: Diagnosis not present

## 2018-03-31 DIAGNOSIS — I6523 Occlusion and stenosis of bilateral carotid arteries: Secondary | ICD-10-CM | POA: Diagnosis not present

## 2018-03-31 DIAGNOSIS — R296 Repeated falls: Secondary | ICD-10-CM | POA: Diagnosis not present

## 2018-03-31 DIAGNOSIS — F0280 Dementia in other diseases classified elsewhere without behavioral disturbance: Secondary | ICD-10-CM | POA: Diagnosis not present

## 2018-03-31 DIAGNOSIS — I10 Essential (primary) hypertension: Secondary | ICD-10-CM | POA: Diagnosis not present

## 2018-03-31 DIAGNOSIS — Z515 Encounter for palliative care: Secondary | ICD-10-CM | POA: Diagnosis not present

## 2018-03-31 DIAGNOSIS — R634 Abnormal weight loss: Secondary | ICD-10-CM | POA: Diagnosis not present

## 2018-03-31 DIAGNOSIS — G300 Alzheimer's disease with early onset: Secondary | ICD-10-CM | POA: Diagnosis not present

## 2018-03-31 DIAGNOSIS — I779 Disorder of arteries and arterioles, unspecified: Secondary | ICD-10-CM | POA: Diagnosis not present

## 2018-03-31 DIAGNOSIS — R410 Disorientation, unspecified: Secondary | ICD-10-CM | POA: Diagnosis not present

## 2018-03-31 DIAGNOSIS — R451 Restlessness and agitation: Secondary | ICD-10-CM | POA: Diagnosis not present

## 2018-04-01 DIAGNOSIS — I10 Essential (primary) hypertension: Secondary | ICD-10-CM | POA: Diagnosis not present

## 2018-04-01 DIAGNOSIS — F0280 Dementia in other diseases classified elsewhere without behavioral disturbance: Secondary | ICD-10-CM | POA: Diagnosis not present

## 2018-04-01 DIAGNOSIS — I6523 Occlusion and stenosis of bilateral carotid arteries: Secondary | ICD-10-CM | POA: Diagnosis not present

## 2018-04-01 DIAGNOSIS — R451 Restlessness and agitation: Secondary | ICD-10-CM | POA: Diagnosis not present

## 2018-04-01 DIAGNOSIS — I779 Disorder of arteries and arterioles, unspecified: Secondary | ICD-10-CM | POA: Diagnosis not present

## 2018-04-01 DIAGNOSIS — R296 Repeated falls: Secondary | ICD-10-CM | POA: Diagnosis not present

## 2018-04-01 DIAGNOSIS — R410 Disorientation, unspecified: Secondary | ICD-10-CM | POA: Diagnosis not present

## 2018-04-01 DIAGNOSIS — R634 Abnormal weight loss: Secondary | ICD-10-CM | POA: Diagnosis not present

## 2018-04-01 DIAGNOSIS — G300 Alzheimer's disease with early onset: Secondary | ICD-10-CM | POA: Diagnosis not present

## 2018-04-01 DIAGNOSIS — Z515 Encounter for palliative care: Secondary | ICD-10-CM | POA: Diagnosis not present

## 2018-04-02 DIAGNOSIS — I779 Disorder of arteries and arterioles, unspecified: Secondary | ICD-10-CM | POA: Diagnosis not present

## 2018-04-02 DIAGNOSIS — F0280 Dementia in other diseases classified elsewhere without behavioral disturbance: Secondary | ICD-10-CM | POA: Diagnosis not present

## 2018-04-02 DIAGNOSIS — R296 Repeated falls: Secondary | ICD-10-CM | POA: Diagnosis not present

## 2018-04-02 DIAGNOSIS — R634 Abnormal weight loss: Secondary | ICD-10-CM | POA: Diagnosis not present

## 2018-04-02 DIAGNOSIS — Z515 Encounter for palliative care: Secondary | ICD-10-CM | POA: Diagnosis not present

## 2018-04-02 DIAGNOSIS — I10 Essential (primary) hypertension: Secondary | ICD-10-CM | POA: Diagnosis not present

## 2018-04-02 DIAGNOSIS — R410 Disorientation, unspecified: Secondary | ICD-10-CM | POA: Diagnosis not present

## 2018-04-02 DIAGNOSIS — G300 Alzheimer's disease with early onset: Secondary | ICD-10-CM | POA: Diagnosis not present

## 2018-04-02 DIAGNOSIS — I6523 Occlusion and stenosis of bilateral carotid arteries: Secondary | ICD-10-CM | POA: Diagnosis not present

## 2018-04-02 DIAGNOSIS — R451 Restlessness and agitation: Secondary | ICD-10-CM | POA: Diagnosis not present

## 2018-04-03 DIAGNOSIS — R634 Abnormal weight loss: Secondary | ICD-10-CM | POA: Diagnosis not present

## 2018-04-03 DIAGNOSIS — G300 Alzheimer's disease with early onset: Secondary | ICD-10-CM | POA: Diagnosis not present

## 2018-04-03 DIAGNOSIS — R296 Repeated falls: Secondary | ICD-10-CM | POA: Diagnosis not present

## 2018-04-03 DIAGNOSIS — F0280 Dementia in other diseases classified elsewhere without behavioral disturbance: Secondary | ICD-10-CM | POA: Diagnosis not present

## 2018-04-03 DIAGNOSIS — I10 Essential (primary) hypertension: Secondary | ICD-10-CM | POA: Diagnosis not present

## 2018-04-03 DIAGNOSIS — Z515 Encounter for palliative care: Secondary | ICD-10-CM | POA: Diagnosis not present

## 2018-04-03 DIAGNOSIS — I779 Disorder of arteries and arterioles, unspecified: Secondary | ICD-10-CM | POA: Diagnosis not present

## 2018-04-03 DIAGNOSIS — I6523 Occlusion and stenosis of bilateral carotid arteries: Secondary | ICD-10-CM | POA: Diagnosis not present

## 2018-04-03 DIAGNOSIS — R410 Disorientation, unspecified: Secondary | ICD-10-CM | POA: Diagnosis not present

## 2018-04-03 DIAGNOSIS — R451 Restlessness and agitation: Secondary | ICD-10-CM | POA: Diagnosis not present

## 2018-04-04 DIAGNOSIS — I779 Disorder of arteries and arterioles, unspecified: Secondary | ICD-10-CM | POA: Diagnosis not present

## 2018-04-04 DIAGNOSIS — R296 Repeated falls: Secondary | ICD-10-CM | POA: Diagnosis not present

## 2018-04-04 DIAGNOSIS — R634 Abnormal weight loss: Secondary | ICD-10-CM | POA: Diagnosis not present

## 2018-04-04 DIAGNOSIS — R451 Restlessness and agitation: Secondary | ICD-10-CM | POA: Diagnosis not present

## 2018-04-04 DIAGNOSIS — Z515 Encounter for palliative care: Secondary | ICD-10-CM | POA: Diagnosis not present

## 2018-04-04 DIAGNOSIS — G300 Alzheimer's disease with early onset: Secondary | ICD-10-CM | POA: Diagnosis not present

## 2018-04-04 DIAGNOSIS — I10 Essential (primary) hypertension: Secondary | ICD-10-CM | POA: Diagnosis not present

## 2018-04-04 DIAGNOSIS — R410 Disorientation, unspecified: Secondary | ICD-10-CM | POA: Diagnosis not present

## 2018-04-04 DIAGNOSIS — F0280 Dementia in other diseases classified elsewhere without behavioral disturbance: Secondary | ICD-10-CM | POA: Diagnosis not present

## 2018-04-04 DIAGNOSIS — I6523 Occlusion and stenosis of bilateral carotid arteries: Secondary | ICD-10-CM | POA: Diagnosis not present

## 2018-04-05 DIAGNOSIS — R296 Repeated falls: Secondary | ICD-10-CM | POA: Diagnosis not present

## 2018-04-05 DIAGNOSIS — R410 Disorientation, unspecified: Secondary | ICD-10-CM | POA: Diagnosis not present

## 2018-04-05 DIAGNOSIS — R451 Restlessness and agitation: Secondary | ICD-10-CM | POA: Diagnosis not present

## 2018-04-05 DIAGNOSIS — Z515 Encounter for palliative care: Secondary | ICD-10-CM | POA: Diagnosis not present

## 2018-04-05 DIAGNOSIS — G300 Alzheimer's disease with early onset: Secondary | ICD-10-CM | POA: Diagnosis not present

## 2018-04-05 DIAGNOSIS — I10 Essential (primary) hypertension: Secondary | ICD-10-CM | POA: Diagnosis not present

## 2018-04-05 DIAGNOSIS — R634 Abnormal weight loss: Secondary | ICD-10-CM | POA: Diagnosis not present

## 2018-04-05 DIAGNOSIS — I779 Disorder of arteries and arterioles, unspecified: Secondary | ICD-10-CM | POA: Diagnosis not present

## 2018-04-05 DIAGNOSIS — I6523 Occlusion and stenosis of bilateral carotid arteries: Secondary | ICD-10-CM | POA: Diagnosis not present

## 2018-04-05 DIAGNOSIS — F0280 Dementia in other diseases classified elsewhere without behavioral disturbance: Secondary | ICD-10-CM | POA: Diagnosis not present

## 2018-04-06 DIAGNOSIS — R451 Restlessness and agitation: Secondary | ICD-10-CM | POA: Diagnosis not present

## 2018-04-06 DIAGNOSIS — R634 Abnormal weight loss: Secondary | ICD-10-CM | POA: Diagnosis not present

## 2018-04-06 DIAGNOSIS — R410 Disorientation, unspecified: Secondary | ICD-10-CM | POA: Diagnosis not present

## 2018-04-06 DIAGNOSIS — F0280 Dementia in other diseases classified elsewhere without behavioral disturbance: Secondary | ICD-10-CM | POA: Diagnosis not present

## 2018-04-06 DIAGNOSIS — I10 Essential (primary) hypertension: Secondary | ICD-10-CM | POA: Diagnosis not present

## 2018-04-06 DIAGNOSIS — I779 Disorder of arteries and arterioles, unspecified: Secondary | ICD-10-CM | POA: Diagnosis not present

## 2018-04-06 DIAGNOSIS — G300 Alzheimer's disease with early onset: Secondary | ICD-10-CM | POA: Diagnosis not present

## 2018-04-06 DIAGNOSIS — R296 Repeated falls: Secondary | ICD-10-CM | POA: Diagnosis not present

## 2018-04-06 DIAGNOSIS — I6523 Occlusion and stenosis of bilateral carotid arteries: Secondary | ICD-10-CM | POA: Diagnosis not present

## 2018-04-06 DIAGNOSIS — Z515 Encounter for palliative care: Secondary | ICD-10-CM | POA: Diagnosis not present

## 2018-04-07 DIAGNOSIS — R634 Abnormal weight loss: Secondary | ICD-10-CM | POA: Diagnosis not present

## 2018-04-07 DIAGNOSIS — R410 Disorientation, unspecified: Secondary | ICD-10-CM | POA: Diagnosis not present

## 2018-04-07 DIAGNOSIS — I10 Essential (primary) hypertension: Secondary | ICD-10-CM | POA: Diagnosis not present

## 2018-04-07 DIAGNOSIS — Z515 Encounter for palliative care: Secondary | ICD-10-CM | POA: Diagnosis not present

## 2018-04-07 DIAGNOSIS — I779 Disorder of arteries and arterioles, unspecified: Secondary | ICD-10-CM | POA: Diagnosis not present

## 2018-04-07 DIAGNOSIS — R451 Restlessness and agitation: Secondary | ICD-10-CM | POA: Diagnosis not present

## 2018-04-07 DIAGNOSIS — I6523 Occlusion and stenosis of bilateral carotid arteries: Secondary | ICD-10-CM | POA: Diagnosis not present

## 2018-04-07 DIAGNOSIS — R296 Repeated falls: Secondary | ICD-10-CM | POA: Diagnosis not present

## 2018-04-07 DIAGNOSIS — G300 Alzheimer's disease with early onset: Secondary | ICD-10-CM | POA: Diagnosis not present

## 2018-04-07 DIAGNOSIS — F0280 Dementia in other diseases classified elsewhere without behavioral disturbance: Secondary | ICD-10-CM | POA: Diagnosis not present

## 2018-04-08 DIAGNOSIS — I6523 Occlusion and stenosis of bilateral carotid arteries: Secondary | ICD-10-CM | POA: Diagnosis not present

## 2018-04-08 DIAGNOSIS — Z515 Encounter for palliative care: Secondary | ICD-10-CM | POA: Diagnosis not present

## 2018-04-08 DIAGNOSIS — R296 Repeated falls: Secondary | ICD-10-CM | POA: Diagnosis not present

## 2018-04-08 DIAGNOSIS — I779 Disorder of arteries and arterioles, unspecified: Secondary | ICD-10-CM | POA: Diagnosis not present

## 2018-04-08 DIAGNOSIS — F0280 Dementia in other diseases classified elsewhere without behavioral disturbance: Secondary | ICD-10-CM | POA: Diagnosis not present

## 2018-04-08 DIAGNOSIS — R410 Disorientation, unspecified: Secondary | ICD-10-CM | POA: Diagnosis not present

## 2018-04-08 DIAGNOSIS — I10 Essential (primary) hypertension: Secondary | ICD-10-CM | POA: Diagnosis not present

## 2018-04-08 DIAGNOSIS — R634 Abnormal weight loss: Secondary | ICD-10-CM | POA: Diagnosis not present

## 2018-04-08 DIAGNOSIS — G300 Alzheimer's disease with early onset: Secondary | ICD-10-CM | POA: Diagnosis not present

## 2018-04-08 DIAGNOSIS — R451 Restlessness and agitation: Secondary | ICD-10-CM | POA: Diagnosis not present

## 2018-04-09 DIAGNOSIS — R634 Abnormal weight loss: Secondary | ICD-10-CM | POA: Diagnosis not present

## 2018-04-09 DIAGNOSIS — I6523 Occlusion and stenosis of bilateral carotid arteries: Secondary | ICD-10-CM | POA: Diagnosis not present

## 2018-04-09 DIAGNOSIS — R410 Disorientation, unspecified: Secondary | ICD-10-CM | POA: Diagnosis not present

## 2018-04-09 DIAGNOSIS — F0390 Unspecified dementia without behavioral disturbance: Secondary | ICD-10-CM | POA: Diagnosis not present

## 2018-04-09 DIAGNOSIS — F419 Anxiety disorder, unspecified: Secondary | ICD-10-CM | POA: Diagnosis not present

## 2018-04-09 DIAGNOSIS — F0280 Dementia in other diseases classified elsewhere without behavioral disturbance: Secondary | ICD-10-CM | POA: Diagnosis not present

## 2018-04-09 DIAGNOSIS — I10 Essential (primary) hypertension: Secondary | ICD-10-CM | POA: Diagnosis not present

## 2018-04-09 DIAGNOSIS — R451 Restlessness and agitation: Secondary | ICD-10-CM | POA: Diagnosis not present

## 2018-04-09 DIAGNOSIS — Z515 Encounter for palliative care: Secondary | ICD-10-CM | POA: Diagnosis not present

## 2018-04-09 DIAGNOSIS — G300 Alzheimer's disease with early onset: Secondary | ICD-10-CM | POA: Diagnosis not present

## 2018-04-09 DIAGNOSIS — I779 Disorder of arteries and arterioles, unspecified: Secondary | ICD-10-CM | POA: Diagnosis not present

## 2018-04-09 DIAGNOSIS — R296 Repeated falls: Secondary | ICD-10-CM | POA: Diagnosis not present

## 2018-04-10 DIAGNOSIS — Z515 Encounter for palliative care: Secondary | ICD-10-CM | POA: Diagnosis not present

## 2018-04-10 DIAGNOSIS — R634 Abnormal weight loss: Secondary | ICD-10-CM | POA: Diagnosis not present

## 2018-04-10 DIAGNOSIS — F0280 Dementia in other diseases classified elsewhere without behavioral disturbance: Secondary | ICD-10-CM | POA: Diagnosis not present

## 2018-04-10 DIAGNOSIS — G300 Alzheimer's disease with early onset: Secondary | ICD-10-CM | POA: Diagnosis not present

## 2018-04-10 DIAGNOSIS — R296 Repeated falls: Secondary | ICD-10-CM | POA: Diagnosis not present

## 2018-04-10 DIAGNOSIS — R410 Disorientation, unspecified: Secondary | ICD-10-CM | POA: Diagnosis not present

## 2018-04-10 DIAGNOSIS — I779 Disorder of arteries and arterioles, unspecified: Secondary | ICD-10-CM | POA: Diagnosis not present

## 2018-04-10 DIAGNOSIS — I6523 Occlusion and stenosis of bilateral carotid arteries: Secondary | ICD-10-CM | POA: Diagnosis not present

## 2018-04-10 DIAGNOSIS — R451 Restlessness and agitation: Secondary | ICD-10-CM | POA: Diagnosis not present

## 2018-04-10 DIAGNOSIS — R569 Unspecified convulsions: Secondary | ICD-10-CM | POA: Diagnosis not present

## 2018-04-10 DIAGNOSIS — I10 Essential (primary) hypertension: Secondary | ICD-10-CM | POA: Diagnosis not present

## 2018-04-11 DIAGNOSIS — R296 Repeated falls: Secondary | ICD-10-CM | POA: Diagnosis not present

## 2018-04-11 DIAGNOSIS — G300 Alzheimer's disease with early onset: Secondary | ICD-10-CM | POA: Diagnosis not present

## 2018-04-11 DIAGNOSIS — Z515 Encounter for palliative care: Secondary | ICD-10-CM | POA: Diagnosis not present

## 2018-04-11 DIAGNOSIS — R634 Abnormal weight loss: Secondary | ICD-10-CM | POA: Diagnosis not present

## 2018-04-11 DIAGNOSIS — I6523 Occlusion and stenosis of bilateral carotid arteries: Secondary | ICD-10-CM | POA: Diagnosis not present

## 2018-04-11 DIAGNOSIS — I10 Essential (primary) hypertension: Secondary | ICD-10-CM | POA: Diagnosis not present

## 2018-04-11 DIAGNOSIS — R410 Disorientation, unspecified: Secondary | ICD-10-CM | POA: Diagnosis not present

## 2018-04-11 DIAGNOSIS — F0280 Dementia in other diseases classified elsewhere without behavioral disturbance: Secondary | ICD-10-CM | POA: Diagnosis not present

## 2018-04-11 DIAGNOSIS — I779 Disorder of arteries and arterioles, unspecified: Secondary | ICD-10-CM | POA: Diagnosis not present

## 2018-04-11 DIAGNOSIS — R451 Restlessness and agitation: Secondary | ICD-10-CM | POA: Diagnosis not present

## 2018-04-12 DIAGNOSIS — R410 Disorientation, unspecified: Secondary | ICD-10-CM | POA: Diagnosis not present

## 2018-04-12 DIAGNOSIS — I6523 Occlusion and stenosis of bilateral carotid arteries: Secondary | ICD-10-CM | POA: Diagnosis not present

## 2018-04-12 DIAGNOSIS — F0280 Dementia in other diseases classified elsewhere without behavioral disturbance: Secondary | ICD-10-CM | POA: Diagnosis not present

## 2018-04-12 DIAGNOSIS — R634 Abnormal weight loss: Secondary | ICD-10-CM | POA: Diagnosis not present

## 2018-04-12 DIAGNOSIS — R296 Repeated falls: Secondary | ICD-10-CM | POA: Diagnosis not present

## 2018-04-12 DIAGNOSIS — Z515 Encounter for palliative care: Secondary | ICD-10-CM | POA: Diagnosis not present

## 2018-04-12 DIAGNOSIS — R451 Restlessness and agitation: Secondary | ICD-10-CM | POA: Diagnosis not present

## 2018-04-12 DIAGNOSIS — I779 Disorder of arteries and arterioles, unspecified: Secondary | ICD-10-CM | POA: Diagnosis not present

## 2018-04-12 DIAGNOSIS — G300 Alzheimer's disease with early onset: Secondary | ICD-10-CM | POA: Diagnosis not present

## 2018-04-12 DIAGNOSIS — I10 Essential (primary) hypertension: Secondary | ICD-10-CM | POA: Diagnosis not present

## 2018-04-13 DIAGNOSIS — G300 Alzheimer's disease with early onset: Secondary | ICD-10-CM | POA: Diagnosis not present

## 2018-04-13 DIAGNOSIS — I10 Essential (primary) hypertension: Secondary | ICD-10-CM | POA: Diagnosis not present

## 2018-04-13 DIAGNOSIS — R410 Disorientation, unspecified: Secondary | ICD-10-CM | POA: Diagnosis not present

## 2018-04-13 DIAGNOSIS — R634 Abnormal weight loss: Secondary | ICD-10-CM | POA: Diagnosis not present

## 2018-04-13 DIAGNOSIS — I779 Disorder of arteries and arterioles, unspecified: Secondary | ICD-10-CM | POA: Diagnosis not present

## 2018-04-13 DIAGNOSIS — Z515 Encounter for palliative care: Secondary | ICD-10-CM | POA: Diagnosis not present

## 2018-04-13 DIAGNOSIS — F0280 Dementia in other diseases classified elsewhere without behavioral disturbance: Secondary | ICD-10-CM | POA: Diagnosis not present

## 2018-04-13 DIAGNOSIS — R296 Repeated falls: Secondary | ICD-10-CM | POA: Diagnosis not present

## 2018-04-13 DIAGNOSIS — R451 Restlessness and agitation: Secondary | ICD-10-CM | POA: Diagnosis not present

## 2018-04-13 DIAGNOSIS — I6523 Occlusion and stenosis of bilateral carotid arteries: Secondary | ICD-10-CM | POA: Diagnosis not present

## 2018-04-14 DIAGNOSIS — R634 Abnormal weight loss: Secondary | ICD-10-CM | POA: Diagnosis not present

## 2018-04-14 DIAGNOSIS — G300 Alzheimer's disease with early onset: Secondary | ICD-10-CM | POA: Diagnosis not present

## 2018-04-14 DIAGNOSIS — R451 Restlessness and agitation: Secondary | ICD-10-CM | POA: Diagnosis not present

## 2018-04-14 DIAGNOSIS — I779 Disorder of arteries and arterioles, unspecified: Secondary | ICD-10-CM | POA: Diagnosis not present

## 2018-04-14 DIAGNOSIS — R410 Disorientation, unspecified: Secondary | ICD-10-CM | POA: Diagnosis not present

## 2018-04-14 DIAGNOSIS — F0280 Dementia in other diseases classified elsewhere without behavioral disturbance: Secondary | ICD-10-CM | POA: Diagnosis not present

## 2018-04-14 DIAGNOSIS — I6523 Occlusion and stenosis of bilateral carotid arteries: Secondary | ICD-10-CM | POA: Diagnosis not present

## 2018-04-14 DIAGNOSIS — I10 Essential (primary) hypertension: Secondary | ICD-10-CM | POA: Diagnosis not present

## 2018-04-14 DIAGNOSIS — R296 Repeated falls: Secondary | ICD-10-CM | POA: Diagnosis not present

## 2018-04-14 DIAGNOSIS — Z515 Encounter for palliative care: Secondary | ICD-10-CM | POA: Diagnosis not present

## 2018-04-15 DIAGNOSIS — G300 Alzheimer's disease with early onset: Secondary | ICD-10-CM | POA: Diagnosis not present

## 2018-04-15 DIAGNOSIS — R634 Abnormal weight loss: Secondary | ICD-10-CM | POA: Diagnosis not present

## 2018-04-15 DIAGNOSIS — R296 Repeated falls: Secondary | ICD-10-CM | POA: Diagnosis not present

## 2018-04-15 DIAGNOSIS — R410 Disorientation, unspecified: Secondary | ICD-10-CM | POA: Diagnosis not present

## 2018-04-15 DIAGNOSIS — R451 Restlessness and agitation: Secondary | ICD-10-CM | POA: Diagnosis not present

## 2018-04-15 DIAGNOSIS — I10 Essential (primary) hypertension: Secondary | ICD-10-CM | POA: Diagnosis not present

## 2018-04-15 DIAGNOSIS — Z515 Encounter for palliative care: Secondary | ICD-10-CM | POA: Diagnosis not present

## 2018-04-15 DIAGNOSIS — I779 Disorder of arteries and arterioles, unspecified: Secondary | ICD-10-CM | POA: Diagnosis not present

## 2018-04-15 DIAGNOSIS — F0280 Dementia in other diseases classified elsewhere without behavioral disturbance: Secondary | ICD-10-CM | POA: Diagnosis not present

## 2018-04-15 DIAGNOSIS — I6523 Occlusion and stenosis of bilateral carotid arteries: Secondary | ICD-10-CM | POA: Diagnosis not present

## 2018-04-16 DIAGNOSIS — Z515 Encounter for palliative care: Secondary | ICD-10-CM | POA: Diagnosis not present

## 2018-04-16 DIAGNOSIS — R296 Repeated falls: Secondary | ICD-10-CM | POA: Diagnosis not present

## 2018-04-16 DIAGNOSIS — R410 Disorientation, unspecified: Secondary | ICD-10-CM | POA: Diagnosis not present

## 2018-04-16 DIAGNOSIS — I10 Essential (primary) hypertension: Secondary | ICD-10-CM | POA: Diagnosis not present

## 2018-04-16 DIAGNOSIS — R634 Abnormal weight loss: Secondary | ICD-10-CM | POA: Diagnosis not present

## 2018-04-16 DIAGNOSIS — R451 Restlessness and agitation: Secondary | ICD-10-CM | POA: Diagnosis not present

## 2018-04-16 DIAGNOSIS — I779 Disorder of arteries and arterioles, unspecified: Secondary | ICD-10-CM | POA: Diagnosis not present

## 2018-04-16 DIAGNOSIS — F0280 Dementia in other diseases classified elsewhere without behavioral disturbance: Secondary | ICD-10-CM | POA: Diagnosis not present

## 2018-04-16 DIAGNOSIS — G300 Alzheimer's disease with early onset: Secondary | ICD-10-CM | POA: Diagnosis not present

## 2018-04-16 DIAGNOSIS — I6523 Occlusion and stenosis of bilateral carotid arteries: Secondary | ICD-10-CM | POA: Diagnosis not present

## 2018-04-17 DIAGNOSIS — I6523 Occlusion and stenosis of bilateral carotid arteries: Secondary | ICD-10-CM | POA: Diagnosis not present

## 2018-04-17 DIAGNOSIS — G300 Alzheimer's disease with early onset: Secondary | ICD-10-CM | POA: Diagnosis not present

## 2018-04-17 DIAGNOSIS — R410 Disorientation, unspecified: Secondary | ICD-10-CM | POA: Diagnosis not present

## 2018-04-17 DIAGNOSIS — Z515 Encounter for palliative care: Secondary | ICD-10-CM | POA: Diagnosis not present

## 2018-04-17 DIAGNOSIS — R451 Restlessness and agitation: Secondary | ICD-10-CM | POA: Diagnosis not present

## 2018-04-17 DIAGNOSIS — I10 Essential (primary) hypertension: Secondary | ICD-10-CM | POA: Diagnosis not present

## 2018-04-17 DIAGNOSIS — R296 Repeated falls: Secondary | ICD-10-CM | POA: Diagnosis not present

## 2018-04-17 DIAGNOSIS — R634 Abnormal weight loss: Secondary | ICD-10-CM | POA: Diagnosis not present

## 2018-04-17 DIAGNOSIS — F0280 Dementia in other diseases classified elsewhere without behavioral disturbance: Secondary | ICD-10-CM | POA: Diagnosis not present

## 2018-04-17 DIAGNOSIS — I779 Disorder of arteries and arterioles, unspecified: Secondary | ICD-10-CM | POA: Diagnosis not present

## 2018-04-18 DIAGNOSIS — I779 Disorder of arteries and arterioles, unspecified: Secondary | ICD-10-CM | POA: Diagnosis not present

## 2018-04-18 DIAGNOSIS — F0280 Dementia in other diseases classified elsewhere without behavioral disturbance: Secondary | ICD-10-CM | POA: Diagnosis not present

## 2018-04-18 DIAGNOSIS — Z515 Encounter for palliative care: Secondary | ICD-10-CM | POA: Diagnosis not present

## 2018-04-18 DIAGNOSIS — I10 Essential (primary) hypertension: Secondary | ICD-10-CM | POA: Diagnosis not present

## 2018-04-18 DIAGNOSIS — I6523 Occlusion and stenosis of bilateral carotid arteries: Secondary | ICD-10-CM | POA: Diagnosis not present

## 2018-04-18 DIAGNOSIS — R451 Restlessness and agitation: Secondary | ICD-10-CM | POA: Diagnosis not present

## 2018-04-18 DIAGNOSIS — R296 Repeated falls: Secondary | ICD-10-CM | POA: Diagnosis not present

## 2018-04-18 DIAGNOSIS — G300 Alzheimer's disease with early onset: Secondary | ICD-10-CM | POA: Diagnosis not present

## 2018-04-18 DIAGNOSIS — R410 Disorientation, unspecified: Secondary | ICD-10-CM | POA: Diagnosis not present

## 2018-04-18 DIAGNOSIS — R634 Abnormal weight loss: Secondary | ICD-10-CM | POA: Diagnosis not present

## 2018-04-19 DIAGNOSIS — R296 Repeated falls: Secondary | ICD-10-CM | POA: Diagnosis not present

## 2018-04-19 DIAGNOSIS — R451 Restlessness and agitation: Secondary | ICD-10-CM | POA: Diagnosis not present

## 2018-04-19 DIAGNOSIS — I10 Essential (primary) hypertension: Secondary | ICD-10-CM | POA: Diagnosis not present

## 2018-04-19 DIAGNOSIS — I6523 Occlusion and stenosis of bilateral carotid arteries: Secondary | ICD-10-CM | POA: Diagnosis not present

## 2018-04-19 DIAGNOSIS — I779 Disorder of arteries and arterioles, unspecified: Secondary | ICD-10-CM | POA: Diagnosis not present

## 2018-04-19 DIAGNOSIS — F0280 Dementia in other diseases classified elsewhere without behavioral disturbance: Secondary | ICD-10-CM | POA: Diagnosis not present

## 2018-04-19 DIAGNOSIS — R410 Disorientation, unspecified: Secondary | ICD-10-CM | POA: Diagnosis not present

## 2018-04-19 DIAGNOSIS — R634 Abnormal weight loss: Secondary | ICD-10-CM | POA: Diagnosis not present

## 2018-04-19 DIAGNOSIS — G300 Alzheimer's disease with early onset: Secondary | ICD-10-CM | POA: Diagnosis not present

## 2018-04-19 DIAGNOSIS — Z515 Encounter for palliative care: Secondary | ICD-10-CM | POA: Diagnosis not present

## 2018-04-20 DIAGNOSIS — I6523 Occlusion and stenosis of bilateral carotid arteries: Secondary | ICD-10-CM | POA: Diagnosis not present

## 2018-04-20 DIAGNOSIS — Z515 Encounter for palliative care: Secondary | ICD-10-CM | POA: Diagnosis not present

## 2018-04-20 DIAGNOSIS — R410 Disorientation, unspecified: Secondary | ICD-10-CM | POA: Diagnosis not present

## 2018-04-20 DIAGNOSIS — I779 Disorder of arteries and arterioles, unspecified: Secondary | ICD-10-CM | POA: Diagnosis not present

## 2018-04-20 DIAGNOSIS — R634 Abnormal weight loss: Secondary | ICD-10-CM | POA: Diagnosis not present

## 2018-04-20 DIAGNOSIS — I10 Essential (primary) hypertension: Secondary | ICD-10-CM | POA: Diagnosis not present

## 2018-04-20 DIAGNOSIS — R296 Repeated falls: Secondary | ICD-10-CM | POA: Diagnosis not present

## 2018-04-20 DIAGNOSIS — G300 Alzheimer's disease with early onset: Secondary | ICD-10-CM | POA: Diagnosis not present

## 2018-04-20 DIAGNOSIS — F0280 Dementia in other diseases classified elsewhere without behavioral disturbance: Secondary | ICD-10-CM | POA: Diagnosis not present

## 2018-04-20 DIAGNOSIS — R451 Restlessness and agitation: Secondary | ICD-10-CM | POA: Diagnosis not present

## 2018-04-21 DIAGNOSIS — R410 Disorientation, unspecified: Secondary | ICD-10-CM | POA: Diagnosis not present

## 2018-04-21 DIAGNOSIS — I779 Disorder of arteries and arterioles, unspecified: Secondary | ICD-10-CM | POA: Diagnosis not present

## 2018-04-21 DIAGNOSIS — Z515 Encounter for palliative care: Secondary | ICD-10-CM | POA: Diagnosis not present

## 2018-04-21 DIAGNOSIS — R296 Repeated falls: Secondary | ICD-10-CM | POA: Diagnosis not present

## 2018-04-21 DIAGNOSIS — R634 Abnormal weight loss: Secondary | ICD-10-CM | POA: Diagnosis not present

## 2018-04-21 DIAGNOSIS — R451 Restlessness and agitation: Secondary | ICD-10-CM | POA: Diagnosis not present

## 2018-04-21 DIAGNOSIS — I10 Essential (primary) hypertension: Secondary | ICD-10-CM | POA: Diagnosis not present

## 2018-04-21 DIAGNOSIS — G300 Alzheimer's disease with early onset: Secondary | ICD-10-CM | POA: Diagnosis not present

## 2018-04-21 DIAGNOSIS — I6523 Occlusion and stenosis of bilateral carotid arteries: Secondary | ICD-10-CM | POA: Diagnosis not present

## 2018-04-21 DIAGNOSIS — F0280 Dementia in other diseases classified elsewhere without behavioral disturbance: Secondary | ICD-10-CM | POA: Diagnosis not present

## 2018-04-22 DIAGNOSIS — F0280 Dementia in other diseases classified elsewhere without behavioral disturbance: Secondary | ICD-10-CM | POA: Diagnosis not present

## 2018-04-22 DIAGNOSIS — I6523 Occlusion and stenosis of bilateral carotid arteries: Secondary | ICD-10-CM | POA: Diagnosis not present

## 2018-04-22 DIAGNOSIS — R410 Disorientation, unspecified: Secondary | ICD-10-CM | POA: Diagnosis not present

## 2018-04-22 DIAGNOSIS — I10 Essential (primary) hypertension: Secondary | ICD-10-CM | POA: Diagnosis not present

## 2018-04-22 DIAGNOSIS — G300 Alzheimer's disease with early onset: Secondary | ICD-10-CM | POA: Diagnosis not present

## 2018-04-22 DIAGNOSIS — R634 Abnormal weight loss: Secondary | ICD-10-CM | POA: Diagnosis not present

## 2018-04-22 DIAGNOSIS — R451 Restlessness and agitation: Secondary | ICD-10-CM | POA: Diagnosis not present

## 2018-04-22 DIAGNOSIS — R296 Repeated falls: Secondary | ICD-10-CM | POA: Diagnosis not present

## 2018-04-22 DIAGNOSIS — Z515 Encounter for palliative care: Secondary | ICD-10-CM | POA: Diagnosis not present

## 2018-04-22 DIAGNOSIS — I779 Disorder of arteries and arterioles, unspecified: Secondary | ICD-10-CM | POA: Diagnosis not present

## 2018-04-23 DIAGNOSIS — I779 Disorder of arteries and arterioles, unspecified: Secondary | ICD-10-CM | POA: Diagnosis not present

## 2018-04-23 DIAGNOSIS — I639 Cerebral infarction, unspecified: Secondary | ICD-10-CM | POA: Diagnosis not present

## 2018-04-23 DIAGNOSIS — R296 Repeated falls: Secondary | ICD-10-CM | POA: Diagnosis not present

## 2018-04-23 DIAGNOSIS — I10 Essential (primary) hypertension: Secondary | ICD-10-CM | POA: Diagnosis not present

## 2018-04-23 DIAGNOSIS — R634 Abnormal weight loss: Secondary | ICD-10-CM | POA: Diagnosis not present

## 2018-04-23 DIAGNOSIS — R410 Disorientation, unspecified: Secondary | ICD-10-CM | POA: Diagnosis not present

## 2018-04-23 DIAGNOSIS — R451 Restlessness and agitation: Secondary | ICD-10-CM | POA: Diagnosis not present

## 2018-04-23 DIAGNOSIS — I6523 Occlusion and stenosis of bilateral carotid arteries: Secondary | ICD-10-CM | POA: Diagnosis not present

## 2018-04-23 DIAGNOSIS — R52 Pain, unspecified: Secondary | ICD-10-CM | POA: Diagnosis not present

## 2018-04-23 DIAGNOSIS — F0280 Dementia in other diseases classified elsewhere without behavioral disturbance: Secondary | ICD-10-CM | POA: Diagnosis not present

## 2018-04-23 DIAGNOSIS — G300 Alzheimer's disease with early onset: Secondary | ICD-10-CM | POA: Diagnosis not present

## 2018-04-23 DIAGNOSIS — Z515 Encounter for palliative care: Secondary | ICD-10-CM | POA: Diagnosis not present

## 2018-04-23 DIAGNOSIS — E785 Hyperlipidemia, unspecified: Secondary | ICD-10-CM | POA: Diagnosis not present

## 2018-04-24 DIAGNOSIS — I10 Essential (primary) hypertension: Secondary | ICD-10-CM | POA: Diagnosis not present

## 2018-04-24 DIAGNOSIS — R296 Repeated falls: Secondary | ICD-10-CM | POA: Diagnosis not present

## 2018-04-24 DIAGNOSIS — R634 Abnormal weight loss: Secondary | ICD-10-CM | POA: Diagnosis not present

## 2018-04-24 DIAGNOSIS — D649 Anemia, unspecified: Secondary | ICD-10-CM | POA: Diagnosis not present

## 2018-04-24 DIAGNOSIS — I6523 Occlusion and stenosis of bilateral carotid arteries: Secondary | ICD-10-CM | POA: Diagnosis not present

## 2018-04-24 DIAGNOSIS — R410 Disorientation, unspecified: Secondary | ICD-10-CM | POA: Diagnosis not present

## 2018-04-24 DIAGNOSIS — R451 Restlessness and agitation: Secondary | ICD-10-CM | POA: Diagnosis not present

## 2018-04-24 DIAGNOSIS — I779 Disorder of arteries and arterioles, unspecified: Secondary | ICD-10-CM | POA: Diagnosis not present

## 2018-04-24 DIAGNOSIS — F0280 Dementia in other diseases classified elsewhere without behavioral disturbance: Secondary | ICD-10-CM | POA: Diagnosis not present

## 2018-04-24 DIAGNOSIS — Z515 Encounter for palliative care: Secondary | ICD-10-CM | POA: Diagnosis not present

## 2018-04-24 DIAGNOSIS — G300 Alzheimer's disease with early onset: Secondary | ICD-10-CM | POA: Diagnosis not present

## 2018-04-25 DIAGNOSIS — F0280 Dementia in other diseases classified elsewhere without behavioral disturbance: Secondary | ICD-10-CM | POA: Diagnosis not present

## 2018-04-25 DIAGNOSIS — I10 Essential (primary) hypertension: Secondary | ICD-10-CM | POA: Diagnosis not present

## 2018-04-25 DIAGNOSIS — R410 Disorientation, unspecified: Secondary | ICD-10-CM | POA: Diagnosis not present

## 2018-04-25 DIAGNOSIS — I779 Disorder of arteries and arterioles, unspecified: Secondary | ICD-10-CM | POA: Diagnosis not present

## 2018-04-25 DIAGNOSIS — R451 Restlessness and agitation: Secondary | ICD-10-CM | POA: Diagnosis not present

## 2018-04-25 DIAGNOSIS — R296 Repeated falls: Secondary | ICD-10-CM | POA: Diagnosis not present

## 2018-04-25 DIAGNOSIS — G300 Alzheimer's disease with early onset: Secondary | ICD-10-CM | POA: Diagnosis not present

## 2018-04-25 DIAGNOSIS — Z515 Encounter for palliative care: Secondary | ICD-10-CM | POA: Diagnosis not present

## 2018-04-25 DIAGNOSIS — R634 Abnormal weight loss: Secondary | ICD-10-CM | POA: Diagnosis not present

## 2018-04-25 DIAGNOSIS — I6523 Occlusion and stenosis of bilateral carotid arteries: Secondary | ICD-10-CM | POA: Diagnosis not present

## 2018-04-26 DIAGNOSIS — F0280 Dementia in other diseases classified elsewhere without behavioral disturbance: Secondary | ICD-10-CM | POA: Diagnosis not present

## 2018-04-26 DIAGNOSIS — R451 Restlessness and agitation: Secondary | ICD-10-CM | POA: Diagnosis not present

## 2018-04-26 DIAGNOSIS — R410 Disorientation, unspecified: Secondary | ICD-10-CM | POA: Diagnosis not present

## 2018-04-26 DIAGNOSIS — I6523 Occlusion and stenosis of bilateral carotid arteries: Secondary | ICD-10-CM | POA: Diagnosis not present

## 2018-04-26 DIAGNOSIS — R296 Repeated falls: Secondary | ICD-10-CM | POA: Diagnosis not present

## 2018-04-26 DIAGNOSIS — R634 Abnormal weight loss: Secondary | ICD-10-CM | POA: Diagnosis not present

## 2018-04-26 DIAGNOSIS — G300 Alzheimer's disease with early onset: Secondary | ICD-10-CM | POA: Diagnosis not present

## 2018-04-26 DIAGNOSIS — Z515 Encounter for palliative care: Secondary | ICD-10-CM | POA: Diagnosis not present

## 2018-04-26 DIAGNOSIS — I779 Disorder of arteries and arterioles, unspecified: Secondary | ICD-10-CM | POA: Diagnosis not present

## 2018-04-26 DIAGNOSIS — I10 Essential (primary) hypertension: Secondary | ICD-10-CM | POA: Diagnosis not present

## 2018-04-27 DIAGNOSIS — Z515 Encounter for palliative care: Secondary | ICD-10-CM | POA: Diagnosis not present

## 2018-04-27 DIAGNOSIS — G300 Alzheimer's disease with early onset: Secondary | ICD-10-CM | POA: Diagnosis not present

## 2018-04-27 DIAGNOSIS — F0280 Dementia in other diseases classified elsewhere without behavioral disturbance: Secondary | ICD-10-CM | POA: Diagnosis not present

## 2018-04-27 DIAGNOSIS — R296 Repeated falls: Secondary | ICD-10-CM | POA: Diagnosis not present

## 2018-04-27 DIAGNOSIS — R451 Restlessness and agitation: Secondary | ICD-10-CM | POA: Diagnosis not present

## 2018-04-27 DIAGNOSIS — I10 Essential (primary) hypertension: Secondary | ICD-10-CM | POA: Diagnosis not present

## 2018-04-27 DIAGNOSIS — R634 Abnormal weight loss: Secondary | ICD-10-CM | POA: Diagnosis not present

## 2018-04-27 DIAGNOSIS — I6523 Occlusion and stenosis of bilateral carotid arteries: Secondary | ICD-10-CM | POA: Diagnosis not present

## 2018-04-27 DIAGNOSIS — I779 Disorder of arteries and arterioles, unspecified: Secondary | ICD-10-CM | POA: Diagnosis not present

## 2018-04-27 DIAGNOSIS — R410 Disorientation, unspecified: Secondary | ICD-10-CM | POA: Diagnosis not present

## 2018-04-28 DIAGNOSIS — F0280 Dementia in other diseases classified elsewhere without behavioral disturbance: Secondary | ICD-10-CM | POA: Diagnosis not present

## 2018-04-28 DIAGNOSIS — I6523 Occlusion and stenosis of bilateral carotid arteries: Secondary | ICD-10-CM | POA: Diagnosis not present

## 2018-04-28 DIAGNOSIS — Z515 Encounter for palliative care: Secondary | ICD-10-CM | POA: Diagnosis not present

## 2018-04-28 DIAGNOSIS — G300 Alzheimer's disease with early onset: Secondary | ICD-10-CM | POA: Diagnosis not present

## 2018-04-28 DIAGNOSIS — R634 Abnormal weight loss: Secondary | ICD-10-CM | POA: Diagnosis not present

## 2018-04-28 DIAGNOSIS — R451 Restlessness and agitation: Secondary | ICD-10-CM | POA: Diagnosis not present

## 2018-04-28 DIAGNOSIS — I779 Disorder of arteries and arterioles, unspecified: Secondary | ICD-10-CM | POA: Diagnosis not present

## 2018-04-28 DIAGNOSIS — R410 Disorientation, unspecified: Secondary | ICD-10-CM | POA: Diagnosis not present

## 2018-04-28 DIAGNOSIS — R296 Repeated falls: Secondary | ICD-10-CM | POA: Diagnosis not present

## 2018-04-28 DIAGNOSIS — I10 Essential (primary) hypertension: Secondary | ICD-10-CM | POA: Diagnosis not present

## 2018-04-29 DIAGNOSIS — Z515 Encounter for palliative care: Secondary | ICD-10-CM | POA: Diagnosis not present

## 2018-04-29 DIAGNOSIS — R634 Abnormal weight loss: Secondary | ICD-10-CM | POA: Diagnosis not present

## 2018-04-29 DIAGNOSIS — R296 Repeated falls: Secondary | ICD-10-CM | POA: Diagnosis not present

## 2018-04-29 DIAGNOSIS — I10 Essential (primary) hypertension: Secondary | ICD-10-CM | POA: Diagnosis not present

## 2018-04-29 DIAGNOSIS — I6523 Occlusion and stenosis of bilateral carotid arteries: Secondary | ICD-10-CM | POA: Diagnosis not present

## 2018-04-29 DIAGNOSIS — R451 Restlessness and agitation: Secondary | ICD-10-CM | POA: Diagnosis not present

## 2018-04-29 DIAGNOSIS — G300 Alzheimer's disease with early onset: Secondary | ICD-10-CM | POA: Diagnosis not present

## 2018-04-29 DIAGNOSIS — F0280 Dementia in other diseases classified elsewhere without behavioral disturbance: Secondary | ICD-10-CM | POA: Diagnosis not present

## 2018-04-29 DIAGNOSIS — R410 Disorientation, unspecified: Secondary | ICD-10-CM | POA: Diagnosis not present

## 2018-04-29 DIAGNOSIS — I779 Disorder of arteries and arterioles, unspecified: Secondary | ICD-10-CM | POA: Diagnosis not present

## 2018-04-30 DIAGNOSIS — R296 Repeated falls: Secondary | ICD-10-CM | POA: Diagnosis not present

## 2018-04-30 DIAGNOSIS — R410 Disorientation, unspecified: Secondary | ICD-10-CM | POA: Diagnosis not present

## 2018-04-30 DIAGNOSIS — Z515 Encounter for palliative care: Secondary | ICD-10-CM | POA: Diagnosis not present

## 2018-04-30 DIAGNOSIS — I10 Essential (primary) hypertension: Secondary | ICD-10-CM | POA: Diagnosis not present

## 2018-04-30 DIAGNOSIS — R634 Abnormal weight loss: Secondary | ICD-10-CM | POA: Diagnosis not present

## 2018-04-30 DIAGNOSIS — R451 Restlessness and agitation: Secondary | ICD-10-CM | POA: Diagnosis not present

## 2018-04-30 DIAGNOSIS — Z66 Do not resuscitate: Secondary | ICD-10-CM | POA: Diagnosis not present

## 2018-04-30 DIAGNOSIS — G300 Alzheimer's disease with early onset: Secondary | ICD-10-CM | POA: Diagnosis not present

## 2018-04-30 DIAGNOSIS — F0280 Dementia in other diseases classified elsewhere without behavioral disturbance: Secondary | ICD-10-CM | POA: Diagnosis not present

## 2018-04-30 DIAGNOSIS — I779 Disorder of arteries and arterioles, unspecified: Secondary | ICD-10-CM | POA: Diagnosis not present

## 2018-04-30 DIAGNOSIS — I6523 Occlusion and stenosis of bilateral carotid arteries: Secondary | ICD-10-CM | POA: Diagnosis not present

## 2018-05-01 DIAGNOSIS — R410 Disorientation, unspecified: Secondary | ICD-10-CM | POA: Diagnosis not present

## 2018-05-01 DIAGNOSIS — I6523 Occlusion and stenosis of bilateral carotid arteries: Secondary | ICD-10-CM | POA: Diagnosis not present

## 2018-05-01 DIAGNOSIS — I10 Essential (primary) hypertension: Secondary | ICD-10-CM | POA: Diagnosis not present

## 2018-05-01 DIAGNOSIS — Z515 Encounter for palliative care: Secondary | ICD-10-CM | POA: Diagnosis not present

## 2018-05-01 DIAGNOSIS — R451 Restlessness and agitation: Secondary | ICD-10-CM | POA: Diagnosis not present

## 2018-05-01 DIAGNOSIS — F0280 Dementia in other diseases classified elsewhere without behavioral disturbance: Secondary | ICD-10-CM | POA: Diagnosis not present

## 2018-05-01 DIAGNOSIS — I779 Disorder of arteries and arterioles, unspecified: Secondary | ICD-10-CM | POA: Diagnosis not present

## 2018-05-01 DIAGNOSIS — R296 Repeated falls: Secondary | ICD-10-CM | POA: Diagnosis not present

## 2018-05-01 DIAGNOSIS — Z66 Do not resuscitate: Secondary | ICD-10-CM | POA: Diagnosis not present

## 2018-05-01 DIAGNOSIS — R634 Abnormal weight loss: Secondary | ICD-10-CM | POA: Diagnosis not present

## 2018-05-01 DIAGNOSIS — G300 Alzheimer's disease with early onset: Secondary | ICD-10-CM | POA: Diagnosis not present

## 2018-05-02 DIAGNOSIS — G300 Alzheimer's disease with early onset: Secondary | ICD-10-CM | POA: Diagnosis not present

## 2018-05-02 DIAGNOSIS — I6523 Occlusion and stenosis of bilateral carotid arteries: Secondary | ICD-10-CM | POA: Diagnosis not present

## 2018-05-02 DIAGNOSIS — Z66 Do not resuscitate: Secondary | ICD-10-CM | POA: Diagnosis not present

## 2018-05-02 DIAGNOSIS — Z515 Encounter for palliative care: Secondary | ICD-10-CM | POA: Diagnosis not present

## 2018-05-02 DIAGNOSIS — R451 Restlessness and agitation: Secondary | ICD-10-CM | POA: Diagnosis not present

## 2018-05-02 DIAGNOSIS — I10 Essential (primary) hypertension: Secondary | ICD-10-CM | POA: Diagnosis not present

## 2018-05-02 DIAGNOSIS — R410 Disorientation, unspecified: Secondary | ICD-10-CM | POA: Diagnosis not present

## 2018-05-02 DIAGNOSIS — I779 Disorder of arteries and arterioles, unspecified: Secondary | ICD-10-CM | POA: Diagnosis not present

## 2018-05-02 DIAGNOSIS — R296 Repeated falls: Secondary | ICD-10-CM | POA: Diagnosis not present

## 2018-05-02 DIAGNOSIS — R634 Abnormal weight loss: Secondary | ICD-10-CM | POA: Diagnosis not present

## 2018-05-02 DIAGNOSIS — F0280 Dementia in other diseases classified elsewhere without behavioral disturbance: Secondary | ICD-10-CM | POA: Diagnosis not present

## 2018-05-03 DIAGNOSIS — I10 Essential (primary) hypertension: Secondary | ICD-10-CM | POA: Diagnosis not present

## 2018-05-03 DIAGNOSIS — I779 Disorder of arteries and arterioles, unspecified: Secondary | ICD-10-CM | POA: Diagnosis not present

## 2018-05-03 DIAGNOSIS — F0280 Dementia in other diseases classified elsewhere without behavioral disturbance: Secondary | ICD-10-CM | POA: Diagnosis not present

## 2018-05-03 DIAGNOSIS — Z515 Encounter for palliative care: Secondary | ICD-10-CM | POA: Diagnosis not present

## 2018-05-03 DIAGNOSIS — G300 Alzheimer's disease with early onset: Secondary | ICD-10-CM | POA: Diagnosis not present

## 2018-05-03 DIAGNOSIS — R410 Disorientation, unspecified: Secondary | ICD-10-CM | POA: Diagnosis not present

## 2018-05-03 DIAGNOSIS — I6523 Occlusion and stenosis of bilateral carotid arteries: Secondary | ICD-10-CM | POA: Diagnosis not present

## 2018-05-03 DIAGNOSIS — Z66 Do not resuscitate: Secondary | ICD-10-CM | POA: Diagnosis not present

## 2018-05-03 DIAGNOSIS — R451 Restlessness and agitation: Secondary | ICD-10-CM | POA: Diagnosis not present

## 2018-05-03 DIAGNOSIS — R296 Repeated falls: Secondary | ICD-10-CM | POA: Diagnosis not present

## 2018-05-03 DIAGNOSIS — R634 Abnormal weight loss: Secondary | ICD-10-CM | POA: Diagnosis not present

## 2018-05-04 DIAGNOSIS — F0280 Dementia in other diseases classified elsewhere without behavioral disturbance: Secondary | ICD-10-CM | POA: Diagnosis not present

## 2018-05-04 DIAGNOSIS — R410 Disorientation, unspecified: Secondary | ICD-10-CM | POA: Diagnosis not present

## 2018-05-04 DIAGNOSIS — I10 Essential (primary) hypertension: Secondary | ICD-10-CM | POA: Diagnosis not present

## 2018-05-04 DIAGNOSIS — Z515 Encounter for palliative care: Secondary | ICD-10-CM | POA: Diagnosis not present

## 2018-05-04 DIAGNOSIS — I779 Disorder of arteries and arterioles, unspecified: Secondary | ICD-10-CM | POA: Diagnosis not present

## 2018-05-04 DIAGNOSIS — R451 Restlessness and agitation: Secondary | ICD-10-CM | POA: Diagnosis not present

## 2018-05-04 DIAGNOSIS — R634 Abnormal weight loss: Secondary | ICD-10-CM | POA: Diagnosis not present

## 2018-05-04 DIAGNOSIS — G300 Alzheimer's disease with early onset: Secondary | ICD-10-CM | POA: Diagnosis not present

## 2018-05-04 DIAGNOSIS — I6523 Occlusion and stenosis of bilateral carotid arteries: Secondary | ICD-10-CM | POA: Diagnosis not present

## 2018-05-04 DIAGNOSIS — R296 Repeated falls: Secondary | ICD-10-CM | POA: Diagnosis not present

## 2018-05-04 DIAGNOSIS — Z66 Do not resuscitate: Secondary | ICD-10-CM | POA: Diagnosis not present

## 2018-05-05 DIAGNOSIS — R296 Repeated falls: Secondary | ICD-10-CM | POA: Diagnosis not present

## 2018-05-05 DIAGNOSIS — I6523 Occlusion and stenosis of bilateral carotid arteries: Secondary | ICD-10-CM | POA: Diagnosis not present

## 2018-05-05 DIAGNOSIS — G300 Alzheimer's disease with early onset: Secondary | ICD-10-CM | POA: Diagnosis not present

## 2018-05-05 DIAGNOSIS — Z515 Encounter for palliative care: Secondary | ICD-10-CM | POA: Diagnosis not present

## 2018-05-05 DIAGNOSIS — R634 Abnormal weight loss: Secondary | ICD-10-CM | POA: Diagnosis not present

## 2018-05-05 DIAGNOSIS — I10 Essential (primary) hypertension: Secondary | ICD-10-CM | POA: Diagnosis not present

## 2018-05-05 DIAGNOSIS — R451 Restlessness and agitation: Secondary | ICD-10-CM | POA: Diagnosis not present

## 2018-05-05 DIAGNOSIS — F0280 Dementia in other diseases classified elsewhere without behavioral disturbance: Secondary | ICD-10-CM | POA: Diagnosis not present

## 2018-05-05 DIAGNOSIS — Z66 Do not resuscitate: Secondary | ICD-10-CM | POA: Diagnosis not present

## 2018-05-05 DIAGNOSIS — I779 Disorder of arteries and arterioles, unspecified: Secondary | ICD-10-CM | POA: Diagnosis not present

## 2018-05-05 DIAGNOSIS — R410 Disorientation, unspecified: Secondary | ICD-10-CM | POA: Diagnosis not present

## 2018-05-06 DIAGNOSIS — R296 Repeated falls: Secondary | ICD-10-CM | POA: Diagnosis not present

## 2018-05-06 DIAGNOSIS — Z515 Encounter for palliative care: Secondary | ICD-10-CM | POA: Diagnosis not present

## 2018-05-06 DIAGNOSIS — Z66 Do not resuscitate: Secondary | ICD-10-CM | POA: Diagnosis not present

## 2018-05-06 DIAGNOSIS — R410 Disorientation, unspecified: Secondary | ICD-10-CM | POA: Diagnosis not present

## 2018-05-06 DIAGNOSIS — I10 Essential (primary) hypertension: Secondary | ICD-10-CM | POA: Diagnosis not present

## 2018-05-06 DIAGNOSIS — R451 Restlessness and agitation: Secondary | ICD-10-CM | POA: Diagnosis not present

## 2018-05-06 DIAGNOSIS — I6523 Occlusion and stenosis of bilateral carotid arteries: Secondary | ICD-10-CM | POA: Diagnosis not present

## 2018-05-06 DIAGNOSIS — G300 Alzheimer's disease with early onset: Secondary | ICD-10-CM | POA: Diagnosis not present

## 2018-05-06 DIAGNOSIS — R634 Abnormal weight loss: Secondary | ICD-10-CM | POA: Diagnosis not present

## 2018-05-06 DIAGNOSIS — F0280 Dementia in other diseases classified elsewhere without behavioral disturbance: Secondary | ICD-10-CM | POA: Diagnosis not present

## 2018-05-06 DIAGNOSIS — I779 Disorder of arteries and arterioles, unspecified: Secondary | ICD-10-CM | POA: Diagnosis not present

## 2018-05-07 DIAGNOSIS — R296 Repeated falls: Secondary | ICD-10-CM | POA: Diagnosis not present

## 2018-05-07 DIAGNOSIS — Z66 Do not resuscitate: Secondary | ICD-10-CM | POA: Diagnosis not present

## 2018-05-07 DIAGNOSIS — I10 Essential (primary) hypertension: Secondary | ICD-10-CM | POA: Diagnosis not present

## 2018-05-07 DIAGNOSIS — F419 Anxiety disorder, unspecified: Secondary | ICD-10-CM | POA: Diagnosis not present

## 2018-05-07 DIAGNOSIS — F0280 Dementia in other diseases classified elsewhere without behavioral disturbance: Secondary | ICD-10-CM | POA: Diagnosis not present

## 2018-05-07 DIAGNOSIS — Z515 Encounter for palliative care: Secondary | ICD-10-CM | POA: Diagnosis not present

## 2018-05-07 DIAGNOSIS — F0390 Unspecified dementia without behavioral disturbance: Secondary | ICD-10-CM | POA: Diagnosis not present

## 2018-05-07 DIAGNOSIS — R634 Abnormal weight loss: Secondary | ICD-10-CM | POA: Diagnosis not present

## 2018-05-07 DIAGNOSIS — G300 Alzheimer's disease with early onset: Secondary | ICD-10-CM | POA: Diagnosis not present

## 2018-05-07 DIAGNOSIS — R451 Restlessness and agitation: Secondary | ICD-10-CM | POA: Diagnosis not present

## 2018-05-07 DIAGNOSIS — I779 Disorder of arteries and arterioles, unspecified: Secondary | ICD-10-CM | POA: Diagnosis not present

## 2018-05-07 DIAGNOSIS — I6523 Occlusion and stenosis of bilateral carotid arteries: Secondary | ICD-10-CM | POA: Diagnosis not present

## 2018-05-07 DIAGNOSIS — R410 Disorientation, unspecified: Secondary | ICD-10-CM | POA: Diagnosis not present

## 2018-05-08 DIAGNOSIS — R296 Repeated falls: Secondary | ICD-10-CM | POA: Diagnosis not present

## 2018-05-08 DIAGNOSIS — R634 Abnormal weight loss: Secondary | ICD-10-CM | POA: Diagnosis not present

## 2018-05-08 DIAGNOSIS — F0280 Dementia in other diseases classified elsewhere without behavioral disturbance: Secondary | ICD-10-CM | POA: Diagnosis not present

## 2018-05-08 DIAGNOSIS — R451 Restlessness and agitation: Secondary | ICD-10-CM | POA: Diagnosis not present

## 2018-05-08 DIAGNOSIS — I6523 Occlusion and stenosis of bilateral carotid arteries: Secondary | ICD-10-CM | POA: Diagnosis not present

## 2018-05-08 DIAGNOSIS — R410 Disorientation, unspecified: Secondary | ICD-10-CM | POA: Diagnosis not present

## 2018-05-08 DIAGNOSIS — I779 Disorder of arteries and arterioles, unspecified: Secondary | ICD-10-CM | POA: Diagnosis not present

## 2018-05-08 DIAGNOSIS — I10 Essential (primary) hypertension: Secondary | ICD-10-CM | POA: Diagnosis not present

## 2018-05-08 DIAGNOSIS — Z66 Do not resuscitate: Secondary | ICD-10-CM | POA: Diagnosis not present

## 2018-05-08 DIAGNOSIS — Z515 Encounter for palliative care: Secondary | ICD-10-CM | POA: Diagnosis not present

## 2018-05-08 DIAGNOSIS — G300 Alzheimer's disease with early onset: Secondary | ICD-10-CM | POA: Diagnosis not present

## 2018-05-09 DIAGNOSIS — R410 Disorientation, unspecified: Secondary | ICD-10-CM | POA: Diagnosis not present

## 2018-05-09 DIAGNOSIS — R296 Repeated falls: Secondary | ICD-10-CM | POA: Diagnosis not present

## 2018-05-09 DIAGNOSIS — R634 Abnormal weight loss: Secondary | ICD-10-CM | POA: Diagnosis not present

## 2018-05-09 DIAGNOSIS — I779 Disorder of arteries and arterioles, unspecified: Secondary | ICD-10-CM | POA: Diagnosis not present

## 2018-05-09 DIAGNOSIS — Z66 Do not resuscitate: Secondary | ICD-10-CM | POA: Diagnosis not present

## 2018-05-09 DIAGNOSIS — G300 Alzheimer's disease with early onset: Secondary | ICD-10-CM | POA: Diagnosis not present

## 2018-05-09 DIAGNOSIS — I10 Essential (primary) hypertension: Secondary | ICD-10-CM | POA: Diagnosis not present

## 2018-05-09 DIAGNOSIS — F0280 Dementia in other diseases classified elsewhere without behavioral disturbance: Secondary | ICD-10-CM | POA: Diagnosis not present

## 2018-05-09 DIAGNOSIS — R451 Restlessness and agitation: Secondary | ICD-10-CM | POA: Diagnosis not present

## 2018-05-09 DIAGNOSIS — I6523 Occlusion and stenosis of bilateral carotid arteries: Secondary | ICD-10-CM | POA: Diagnosis not present

## 2018-05-09 DIAGNOSIS — Z515 Encounter for palliative care: Secondary | ICD-10-CM | POA: Diagnosis not present

## 2018-05-10 DIAGNOSIS — R451 Restlessness and agitation: Secondary | ICD-10-CM | POA: Diagnosis not present

## 2018-05-10 DIAGNOSIS — R296 Repeated falls: Secondary | ICD-10-CM | POA: Diagnosis not present

## 2018-05-10 DIAGNOSIS — G300 Alzheimer's disease with early onset: Secondary | ICD-10-CM | POA: Diagnosis not present

## 2018-05-10 DIAGNOSIS — Z66 Do not resuscitate: Secondary | ICD-10-CM | POA: Diagnosis not present

## 2018-05-10 DIAGNOSIS — F0280 Dementia in other diseases classified elsewhere without behavioral disturbance: Secondary | ICD-10-CM | POA: Diagnosis not present

## 2018-05-10 DIAGNOSIS — I779 Disorder of arteries and arterioles, unspecified: Secondary | ICD-10-CM | POA: Diagnosis not present

## 2018-05-10 DIAGNOSIS — I6523 Occlusion and stenosis of bilateral carotid arteries: Secondary | ICD-10-CM | POA: Diagnosis not present

## 2018-05-10 DIAGNOSIS — I10 Essential (primary) hypertension: Secondary | ICD-10-CM | POA: Diagnosis not present

## 2018-05-10 DIAGNOSIS — R634 Abnormal weight loss: Secondary | ICD-10-CM | POA: Diagnosis not present

## 2018-05-10 DIAGNOSIS — Z515 Encounter for palliative care: Secondary | ICD-10-CM | POA: Diagnosis not present

## 2018-05-10 DIAGNOSIS — R410 Disorientation, unspecified: Secondary | ICD-10-CM | POA: Diagnosis not present

## 2018-05-11 DIAGNOSIS — R634 Abnormal weight loss: Secondary | ICD-10-CM | POA: Diagnosis not present

## 2018-05-11 DIAGNOSIS — I779 Disorder of arteries and arterioles, unspecified: Secondary | ICD-10-CM | POA: Diagnosis not present

## 2018-05-11 DIAGNOSIS — I6523 Occlusion and stenosis of bilateral carotid arteries: Secondary | ICD-10-CM | POA: Diagnosis not present

## 2018-05-11 DIAGNOSIS — R451 Restlessness and agitation: Secondary | ICD-10-CM | POA: Diagnosis not present

## 2018-05-11 DIAGNOSIS — R410 Disorientation, unspecified: Secondary | ICD-10-CM | POA: Diagnosis not present

## 2018-05-11 DIAGNOSIS — Z66 Do not resuscitate: Secondary | ICD-10-CM | POA: Diagnosis not present

## 2018-05-11 DIAGNOSIS — G300 Alzheimer's disease with early onset: Secondary | ICD-10-CM | POA: Diagnosis not present

## 2018-05-11 DIAGNOSIS — F0280 Dementia in other diseases classified elsewhere without behavioral disturbance: Secondary | ICD-10-CM | POA: Diagnosis not present

## 2018-05-11 DIAGNOSIS — I10 Essential (primary) hypertension: Secondary | ICD-10-CM | POA: Diagnosis not present

## 2018-05-11 DIAGNOSIS — Z515 Encounter for palliative care: Secondary | ICD-10-CM | POA: Diagnosis not present

## 2018-05-11 DIAGNOSIS — R296 Repeated falls: Secondary | ICD-10-CM | POA: Diagnosis not present

## 2018-05-12 DIAGNOSIS — R410 Disorientation, unspecified: Secondary | ICD-10-CM | POA: Diagnosis not present

## 2018-05-12 DIAGNOSIS — R634 Abnormal weight loss: Secondary | ICD-10-CM | POA: Diagnosis not present

## 2018-05-12 DIAGNOSIS — I6523 Occlusion and stenosis of bilateral carotid arteries: Secondary | ICD-10-CM | POA: Diagnosis not present

## 2018-05-12 DIAGNOSIS — I10 Essential (primary) hypertension: Secondary | ICD-10-CM | POA: Diagnosis not present

## 2018-05-12 DIAGNOSIS — R451 Restlessness and agitation: Secondary | ICD-10-CM | POA: Diagnosis not present

## 2018-05-12 DIAGNOSIS — G300 Alzheimer's disease with early onset: Secondary | ICD-10-CM | POA: Diagnosis not present

## 2018-05-12 DIAGNOSIS — I779 Disorder of arteries and arterioles, unspecified: Secondary | ICD-10-CM | POA: Diagnosis not present

## 2018-05-12 DIAGNOSIS — Z515 Encounter for palliative care: Secondary | ICD-10-CM | POA: Diagnosis not present

## 2018-05-12 DIAGNOSIS — Z66 Do not resuscitate: Secondary | ICD-10-CM | POA: Diagnosis not present

## 2018-05-12 DIAGNOSIS — R296 Repeated falls: Secondary | ICD-10-CM | POA: Diagnosis not present

## 2018-05-12 DIAGNOSIS — F0280 Dementia in other diseases classified elsewhere without behavioral disturbance: Secondary | ICD-10-CM | POA: Diagnosis not present

## 2018-05-13 DIAGNOSIS — R451 Restlessness and agitation: Secondary | ICD-10-CM | POA: Diagnosis not present

## 2018-05-13 DIAGNOSIS — F0280 Dementia in other diseases classified elsewhere without behavioral disturbance: Secondary | ICD-10-CM | POA: Diagnosis not present

## 2018-05-13 DIAGNOSIS — G300 Alzheimer's disease with early onset: Secondary | ICD-10-CM | POA: Diagnosis not present

## 2018-05-13 DIAGNOSIS — R634 Abnormal weight loss: Secondary | ICD-10-CM | POA: Diagnosis not present

## 2018-05-13 DIAGNOSIS — I779 Disorder of arteries and arterioles, unspecified: Secondary | ICD-10-CM | POA: Diagnosis not present

## 2018-05-13 DIAGNOSIS — I6523 Occlusion and stenosis of bilateral carotid arteries: Secondary | ICD-10-CM | POA: Diagnosis not present

## 2018-05-13 DIAGNOSIS — R410 Disorientation, unspecified: Secondary | ICD-10-CM | POA: Diagnosis not present

## 2018-05-13 DIAGNOSIS — R296 Repeated falls: Secondary | ICD-10-CM | POA: Diagnosis not present

## 2018-05-13 DIAGNOSIS — Z515 Encounter for palliative care: Secondary | ICD-10-CM | POA: Diagnosis not present

## 2018-05-13 DIAGNOSIS — Z66 Do not resuscitate: Secondary | ICD-10-CM | POA: Diagnosis not present

## 2018-05-13 DIAGNOSIS — I10 Essential (primary) hypertension: Secondary | ICD-10-CM | POA: Diagnosis not present

## 2018-05-14 DIAGNOSIS — F0280 Dementia in other diseases classified elsewhere without behavioral disturbance: Secondary | ICD-10-CM | POA: Diagnosis not present

## 2018-05-14 DIAGNOSIS — Z66 Do not resuscitate: Secondary | ICD-10-CM | POA: Diagnosis not present

## 2018-05-14 DIAGNOSIS — R296 Repeated falls: Secondary | ICD-10-CM | POA: Diagnosis not present

## 2018-05-14 DIAGNOSIS — G300 Alzheimer's disease with early onset: Secondary | ICD-10-CM | POA: Diagnosis not present

## 2018-05-14 DIAGNOSIS — I779 Disorder of arteries and arterioles, unspecified: Secondary | ICD-10-CM | POA: Diagnosis not present

## 2018-05-14 DIAGNOSIS — I6523 Occlusion and stenosis of bilateral carotid arteries: Secondary | ICD-10-CM | POA: Diagnosis not present

## 2018-05-14 DIAGNOSIS — I10 Essential (primary) hypertension: Secondary | ICD-10-CM | POA: Diagnosis not present

## 2018-05-14 DIAGNOSIS — Z515 Encounter for palliative care: Secondary | ICD-10-CM | POA: Diagnosis not present

## 2018-05-14 DIAGNOSIS — R634 Abnormal weight loss: Secondary | ICD-10-CM | POA: Diagnosis not present

## 2018-05-14 DIAGNOSIS — R451 Restlessness and agitation: Secondary | ICD-10-CM | POA: Diagnosis not present

## 2018-05-14 DIAGNOSIS — R410 Disorientation, unspecified: Secondary | ICD-10-CM | POA: Diagnosis not present

## 2018-05-15 DIAGNOSIS — Z66 Do not resuscitate: Secondary | ICD-10-CM | POA: Diagnosis not present

## 2018-05-15 DIAGNOSIS — I779 Disorder of arteries and arterioles, unspecified: Secondary | ICD-10-CM | POA: Diagnosis not present

## 2018-05-15 DIAGNOSIS — F0280 Dementia in other diseases classified elsewhere without behavioral disturbance: Secondary | ICD-10-CM | POA: Diagnosis not present

## 2018-05-15 DIAGNOSIS — Z515 Encounter for palliative care: Secondary | ICD-10-CM | POA: Diagnosis not present

## 2018-05-15 DIAGNOSIS — R410 Disorientation, unspecified: Secondary | ICD-10-CM | POA: Diagnosis not present

## 2018-05-15 DIAGNOSIS — I10 Essential (primary) hypertension: Secondary | ICD-10-CM | POA: Diagnosis not present

## 2018-05-15 DIAGNOSIS — R451 Restlessness and agitation: Secondary | ICD-10-CM | POA: Diagnosis not present

## 2018-05-15 DIAGNOSIS — R296 Repeated falls: Secondary | ICD-10-CM | POA: Diagnosis not present

## 2018-05-15 DIAGNOSIS — G300 Alzheimer's disease with early onset: Secondary | ICD-10-CM | POA: Diagnosis not present

## 2018-05-15 DIAGNOSIS — R634 Abnormal weight loss: Secondary | ICD-10-CM | POA: Diagnosis not present

## 2018-05-15 DIAGNOSIS — I6523 Occlusion and stenosis of bilateral carotid arteries: Secondary | ICD-10-CM | POA: Diagnosis not present

## 2018-05-16 DIAGNOSIS — R296 Repeated falls: Secondary | ICD-10-CM | POA: Diagnosis not present

## 2018-05-16 DIAGNOSIS — R410 Disorientation, unspecified: Secondary | ICD-10-CM | POA: Diagnosis not present

## 2018-05-16 DIAGNOSIS — I10 Essential (primary) hypertension: Secondary | ICD-10-CM | POA: Diagnosis not present

## 2018-05-16 DIAGNOSIS — I779 Disorder of arteries and arterioles, unspecified: Secondary | ICD-10-CM | POA: Diagnosis not present

## 2018-05-16 DIAGNOSIS — Z66 Do not resuscitate: Secondary | ICD-10-CM | POA: Diagnosis not present

## 2018-05-16 DIAGNOSIS — I6523 Occlusion and stenosis of bilateral carotid arteries: Secondary | ICD-10-CM | POA: Diagnosis not present

## 2018-05-16 DIAGNOSIS — R451 Restlessness and agitation: Secondary | ICD-10-CM | POA: Diagnosis not present

## 2018-05-16 DIAGNOSIS — R634 Abnormal weight loss: Secondary | ICD-10-CM | POA: Diagnosis not present

## 2018-05-16 DIAGNOSIS — G300 Alzheimer's disease with early onset: Secondary | ICD-10-CM | POA: Diagnosis not present

## 2018-05-16 DIAGNOSIS — F0280 Dementia in other diseases classified elsewhere without behavioral disturbance: Secondary | ICD-10-CM | POA: Diagnosis not present

## 2018-05-16 DIAGNOSIS — Z515 Encounter for palliative care: Secondary | ICD-10-CM | POA: Diagnosis not present

## 2018-05-17 DIAGNOSIS — R451 Restlessness and agitation: Secondary | ICD-10-CM | POA: Diagnosis not present

## 2018-05-17 DIAGNOSIS — Z515 Encounter for palliative care: Secondary | ICD-10-CM | POA: Diagnosis not present

## 2018-05-17 DIAGNOSIS — G300 Alzheimer's disease with early onset: Secondary | ICD-10-CM | POA: Diagnosis not present

## 2018-05-17 DIAGNOSIS — R296 Repeated falls: Secondary | ICD-10-CM | POA: Diagnosis not present

## 2018-05-17 DIAGNOSIS — R410 Disorientation, unspecified: Secondary | ICD-10-CM | POA: Diagnosis not present

## 2018-05-17 DIAGNOSIS — I779 Disorder of arteries and arterioles, unspecified: Secondary | ICD-10-CM | POA: Diagnosis not present

## 2018-05-17 DIAGNOSIS — I6523 Occlusion and stenosis of bilateral carotid arteries: Secondary | ICD-10-CM | POA: Diagnosis not present

## 2018-05-17 DIAGNOSIS — R634 Abnormal weight loss: Secondary | ICD-10-CM | POA: Diagnosis not present

## 2018-05-17 DIAGNOSIS — F0280 Dementia in other diseases classified elsewhere without behavioral disturbance: Secondary | ICD-10-CM | POA: Diagnosis not present

## 2018-05-17 DIAGNOSIS — I10 Essential (primary) hypertension: Secondary | ICD-10-CM | POA: Diagnosis not present

## 2018-05-17 DIAGNOSIS — Z66 Do not resuscitate: Secondary | ICD-10-CM | POA: Diagnosis not present

## 2018-05-18 DIAGNOSIS — R634 Abnormal weight loss: Secondary | ICD-10-CM | POA: Diagnosis not present

## 2018-05-18 DIAGNOSIS — G300 Alzheimer's disease with early onset: Secondary | ICD-10-CM | POA: Diagnosis not present

## 2018-05-18 DIAGNOSIS — R410 Disorientation, unspecified: Secondary | ICD-10-CM | POA: Diagnosis not present

## 2018-05-18 DIAGNOSIS — I779 Disorder of arteries and arterioles, unspecified: Secondary | ICD-10-CM | POA: Diagnosis not present

## 2018-05-18 DIAGNOSIS — Z66 Do not resuscitate: Secondary | ICD-10-CM | POA: Diagnosis not present

## 2018-05-18 DIAGNOSIS — R451 Restlessness and agitation: Secondary | ICD-10-CM | POA: Diagnosis not present

## 2018-05-18 DIAGNOSIS — R296 Repeated falls: Secondary | ICD-10-CM | POA: Diagnosis not present

## 2018-05-18 DIAGNOSIS — I10 Essential (primary) hypertension: Secondary | ICD-10-CM | POA: Diagnosis not present

## 2018-05-18 DIAGNOSIS — I6523 Occlusion and stenosis of bilateral carotid arteries: Secondary | ICD-10-CM | POA: Diagnosis not present

## 2018-05-18 DIAGNOSIS — F0280 Dementia in other diseases classified elsewhere without behavioral disturbance: Secondary | ICD-10-CM | POA: Diagnosis not present

## 2018-05-18 DIAGNOSIS — Z515 Encounter for palliative care: Secondary | ICD-10-CM | POA: Diagnosis not present

## 2018-05-19 DIAGNOSIS — R296 Repeated falls: Secondary | ICD-10-CM | POA: Diagnosis not present

## 2018-05-19 DIAGNOSIS — Z515 Encounter for palliative care: Secondary | ICD-10-CM | POA: Diagnosis not present

## 2018-05-19 DIAGNOSIS — F0280 Dementia in other diseases classified elsewhere without behavioral disturbance: Secondary | ICD-10-CM | POA: Diagnosis not present

## 2018-05-19 DIAGNOSIS — R451 Restlessness and agitation: Secondary | ICD-10-CM | POA: Diagnosis not present

## 2018-05-19 DIAGNOSIS — I779 Disorder of arteries and arterioles, unspecified: Secondary | ICD-10-CM | POA: Diagnosis not present

## 2018-05-19 DIAGNOSIS — Z66 Do not resuscitate: Secondary | ICD-10-CM | POA: Diagnosis not present

## 2018-05-19 DIAGNOSIS — R634 Abnormal weight loss: Secondary | ICD-10-CM | POA: Diagnosis not present

## 2018-05-19 DIAGNOSIS — I6523 Occlusion and stenosis of bilateral carotid arteries: Secondary | ICD-10-CM | POA: Diagnosis not present

## 2018-05-19 DIAGNOSIS — I10 Essential (primary) hypertension: Secondary | ICD-10-CM | POA: Diagnosis not present

## 2018-05-19 DIAGNOSIS — R410 Disorientation, unspecified: Secondary | ICD-10-CM | POA: Diagnosis not present

## 2018-05-19 DIAGNOSIS — G300 Alzheimer's disease with early onset: Secondary | ICD-10-CM | POA: Diagnosis not present

## 2018-05-20 DIAGNOSIS — Z515 Encounter for palliative care: Secondary | ICD-10-CM | POA: Diagnosis not present

## 2018-05-20 DIAGNOSIS — Z66 Do not resuscitate: Secondary | ICD-10-CM | POA: Diagnosis not present

## 2018-05-20 DIAGNOSIS — G300 Alzheimer's disease with early onset: Secondary | ICD-10-CM | POA: Diagnosis not present

## 2018-05-20 DIAGNOSIS — R634 Abnormal weight loss: Secondary | ICD-10-CM | POA: Diagnosis not present

## 2018-05-20 DIAGNOSIS — R451 Restlessness and agitation: Secondary | ICD-10-CM | POA: Diagnosis not present

## 2018-05-20 DIAGNOSIS — I10 Essential (primary) hypertension: Secondary | ICD-10-CM | POA: Diagnosis not present

## 2018-05-20 DIAGNOSIS — I779 Disorder of arteries and arterioles, unspecified: Secondary | ICD-10-CM | POA: Diagnosis not present

## 2018-05-20 DIAGNOSIS — I6523 Occlusion and stenosis of bilateral carotid arteries: Secondary | ICD-10-CM | POA: Diagnosis not present

## 2018-05-20 DIAGNOSIS — R296 Repeated falls: Secondary | ICD-10-CM | POA: Diagnosis not present

## 2018-05-20 DIAGNOSIS — F0280 Dementia in other diseases classified elsewhere without behavioral disturbance: Secondary | ICD-10-CM | POA: Diagnosis not present

## 2018-05-20 DIAGNOSIS — R410 Disorientation, unspecified: Secondary | ICD-10-CM | POA: Diagnosis not present

## 2018-05-21 DIAGNOSIS — R451 Restlessness and agitation: Secondary | ICD-10-CM | POA: Diagnosis not present

## 2018-05-21 DIAGNOSIS — R410 Disorientation, unspecified: Secondary | ICD-10-CM | POA: Diagnosis not present

## 2018-05-21 DIAGNOSIS — R634 Abnormal weight loss: Secondary | ICD-10-CM | POA: Diagnosis not present

## 2018-05-21 DIAGNOSIS — I779 Disorder of arteries and arterioles, unspecified: Secondary | ICD-10-CM | POA: Diagnosis not present

## 2018-05-21 DIAGNOSIS — F0280 Dementia in other diseases classified elsewhere without behavioral disturbance: Secondary | ICD-10-CM | POA: Diagnosis not present

## 2018-05-21 DIAGNOSIS — Z66 Do not resuscitate: Secondary | ICD-10-CM | POA: Diagnosis not present

## 2018-05-21 DIAGNOSIS — G300 Alzheimer's disease with early onset: Secondary | ICD-10-CM | POA: Diagnosis not present

## 2018-05-21 DIAGNOSIS — Z515 Encounter for palliative care: Secondary | ICD-10-CM | POA: Diagnosis not present

## 2018-05-21 DIAGNOSIS — R296 Repeated falls: Secondary | ICD-10-CM | POA: Diagnosis not present

## 2018-05-21 DIAGNOSIS — I10 Essential (primary) hypertension: Secondary | ICD-10-CM | POA: Diagnosis not present

## 2018-05-21 DIAGNOSIS — I6523 Occlusion and stenosis of bilateral carotid arteries: Secondary | ICD-10-CM | POA: Diagnosis not present

## 2018-05-22 DIAGNOSIS — Z515 Encounter for palliative care: Secondary | ICD-10-CM | POA: Diagnosis not present

## 2018-05-22 DIAGNOSIS — F0280 Dementia in other diseases classified elsewhere without behavioral disturbance: Secondary | ICD-10-CM | POA: Diagnosis not present

## 2018-05-22 DIAGNOSIS — I6523 Occlusion and stenosis of bilateral carotid arteries: Secondary | ICD-10-CM | POA: Diagnosis not present

## 2018-05-22 DIAGNOSIS — R410 Disorientation, unspecified: Secondary | ICD-10-CM | POA: Diagnosis not present

## 2018-05-22 DIAGNOSIS — I779 Disorder of arteries and arterioles, unspecified: Secondary | ICD-10-CM | POA: Diagnosis not present

## 2018-05-22 DIAGNOSIS — I10 Essential (primary) hypertension: Secondary | ICD-10-CM | POA: Diagnosis not present

## 2018-05-22 DIAGNOSIS — R451 Restlessness and agitation: Secondary | ICD-10-CM | POA: Diagnosis not present

## 2018-05-22 DIAGNOSIS — G300 Alzheimer's disease with early onset: Secondary | ICD-10-CM | POA: Diagnosis not present

## 2018-05-22 DIAGNOSIS — R296 Repeated falls: Secondary | ICD-10-CM | POA: Diagnosis not present

## 2018-05-22 DIAGNOSIS — Z66 Do not resuscitate: Secondary | ICD-10-CM | POA: Diagnosis not present

## 2018-05-22 DIAGNOSIS — R634 Abnormal weight loss: Secondary | ICD-10-CM | POA: Diagnosis not present

## 2018-05-23 DIAGNOSIS — F0280 Dementia in other diseases classified elsewhere without behavioral disturbance: Secondary | ICD-10-CM | POA: Diagnosis not present

## 2018-05-23 DIAGNOSIS — I10 Essential (primary) hypertension: Secondary | ICD-10-CM | POA: Diagnosis not present

## 2018-05-23 DIAGNOSIS — R634 Abnormal weight loss: Secondary | ICD-10-CM | POA: Diagnosis not present

## 2018-05-23 DIAGNOSIS — I779 Disorder of arteries and arterioles, unspecified: Secondary | ICD-10-CM | POA: Diagnosis not present

## 2018-05-23 DIAGNOSIS — I6523 Occlusion and stenosis of bilateral carotid arteries: Secondary | ICD-10-CM | POA: Diagnosis not present

## 2018-05-23 DIAGNOSIS — R296 Repeated falls: Secondary | ICD-10-CM | POA: Diagnosis not present

## 2018-05-23 DIAGNOSIS — R451 Restlessness and agitation: Secondary | ICD-10-CM | POA: Diagnosis not present

## 2018-05-23 DIAGNOSIS — R410 Disorientation, unspecified: Secondary | ICD-10-CM | POA: Diagnosis not present

## 2018-05-23 DIAGNOSIS — G300 Alzheimer's disease with early onset: Secondary | ICD-10-CM | POA: Diagnosis not present

## 2018-05-23 DIAGNOSIS — Z66 Do not resuscitate: Secondary | ICD-10-CM | POA: Diagnosis not present

## 2018-05-23 DIAGNOSIS — Z515 Encounter for palliative care: Secondary | ICD-10-CM | POA: Diagnosis not present

## 2018-05-24 DIAGNOSIS — I10 Essential (primary) hypertension: Secondary | ICD-10-CM | POA: Diagnosis not present

## 2018-05-24 DIAGNOSIS — R410 Disorientation, unspecified: Secondary | ICD-10-CM | POA: Diagnosis not present

## 2018-05-24 DIAGNOSIS — R296 Repeated falls: Secondary | ICD-10-CM | POA: Diagnosis not present

## 2018-05-24 DIAGNOSIS — R634 Abnormal weight loss: Secondary | ICD-10-CM | POA: Diagnosis not present

## 2018-05-24 DIAGNOSIS — G300 Alzheimer's disease with early onset: Secondary | ICD-10-CM | POA: Diagnosis not present

## 2018-05-24 DIAGNOSIS — I6523 Occlusion and stenosis of bilateral carotid arteries: Secondary | ICD-10-CM | POA: Diagnosis not present

## 2018-05-24 DIAGNOSIS — F0280 Dementia in other diseases classified elsewhere without behavioral disturbance: Secondary | ICD-10-CM | POA: Diagnosis not present

## 2018-05-24 DIAGNOSIS — I779 Disorder of arteries and arterioles, unspecified: Secondary | ICD-10-CM | POA: Diagnosis not present

## 2018-05-24 DIAGNOSIS — Z66 Do not resuscitate: Secondary | ICD-10-CM | POA: Diagnosis not present

## 2018-05-24 DIAGNOSIS — Z515 Encounter for palliative care: Secondary | ICD-10-CM | POA: Diagnosis not present

## 2018-05-24 DIAGNOSIS — R451 Restlessness and agitation: Secondary | ICD-10-CM | POA: Diagnosis not present

## 2018-05-25 DIAGNOSIS — I6523 Occlusion and stenosis of bilateral carotid arteries: Secondary | ICD-10-CM | POA: Diagnosis not present

## 2018-05-25 DIAGNOSIS — R410 Disorientation, unspecified: Secondary | ICD-10-CM | POA: Diagnosis not present

## 2018-05-25 DIAGNOSIS — G300 Alzheimer's disease with early onset: Secondary | ICD-10-CM | POA: Diagnosis not present

## 2018-05-25 DIAGNOSIS — I10 Essential (primary) hypertension: Secondary | ICD-10-CM | POA: Diagnosis not present

## 2018-05-25 DIAGNOSIS — R296 Repeated falls: Secondary | ICD-10-CM | POA: Diagnosis not present

## 2018-05-25 DIAGNOSIS — F0280 Dementia in other diseases classified elsewhere without behavioral disturbance: Secondary | ICD-10-CM | POA: Diagnosis not present

## 2018-05-25 DIAGNOSIS — Z66 Do not resuscitate: Secondary | ICD-10-CM | POA: Diagnosis not present

## 2018-05-25 DIAGNOSIS — Z515 Encounter for palliative care: Secondary | ICD-10-CM | POA: Diagnosis not present

## 2018-05-25 DIAGNOSIS — I779 Disorder of arteries and arterioles, unspecified: Secondary | ICD-10-CM | POA: Diagnosis not present

## 2018-05-25 DIAGNOSIS — R634 Abnormal weight loss: Secondary | ICD-10-CM | POA: Diagnosis not present

## 2018-05-25 DIAGNOSIS — R451 Restlessness and agitation: Secondary | ICD-10-CM | POA: Diagnosis not present

## 2018-05-26 DIAGNOSIS — G300 Alzheimer's disease with early onset: Secondary | ICD-10-CM | POA: Diagnosis not present

## 2018-05-26 DIAGNOSIS — R296 Repeated falls: Secondary | ICD-10-CM | POA: Diagnosis not present

## 2018-05-26 DIAGNOSIS — Z66 Do not resuscitate: Secondary | ICD-10-CM | POA: Diagnosis not present

## 2018-05-26 DIAGNOSIS — F0280 Dementia in other diseases classified elsewhere without behavioral disturbance: Secondary | ICD-10-CM | POA: Diagnosis not present

## 2018-05-26 DIAGNOSIS — R410 Disorientation, unspecified: Secondary | ICD-10-CM | POA: Diagnosis not present

## 2018-05-26 DIAGNOSIS — R634 Abnormal weight loss: Secondary | ICD-10-CM | POA: Diagnosis not present

## 2018-05-26 DIAGNOSIS — I779 Disorder of arteries and arterioles, unspecified: Secondary | ICD-10-CM | POA: Diagnosis not present

## 2018-05-26 DIAGNOSIS — I10 Essential (primary) hypertension: Secondary | ICD-10-CM | POA: Diagnosis not present

## 2018-05-26 DIAGNOSIS — Z515 Encounter for palliative care: Secondary | ICD-10-CM | POA: Diagnosis not present

## 2018-05-26 DIAGNOSIS — I6523 Occlusion and stenosis of bilateral carotid arteries: Secondary | ICD-10-CM | POA: Diagnosis not present

## 2018-05-26 DIAGNOSIS — R451 Restlessness and agitation: Secondary | ICD-10-CM | POA: Diagnosis not present

## 2018-05-27 DIAGNOSIS — R634 Abnormal weight loss: Secondary | ICD-10-CM | POA: Diagnosis not present

## 2018-05-27 DIAGNOSIS — I6523 Occlusion and stenosis of bilateral carotid arteries: Secondary | ICD-10-CM | POA: Diagnosis not present

## 2018-05-27 DIAGNOSIS — Z515 Encounter for palliative care: Secondary | ICD-10-CM | POA: Diagnosis not present

## 2018-05-27 DIAGNOSIS — R451 Restlessness and agitation: Secondary | ICD-10-CM | POA: Diagnosis not present

## 2018-05-27 DIAGNOSIS — I10 Essential (primary) hypertension: Secondary | ICD-10-CM | POA: Diagnosis not present

## 2018-05-27 DIAGNOSIS — I779 Disorder of arteries and arterioles, unspecified: Secondary | ICD-10-CM | POA: Diagnosis not present

## 2018-05-27 DIAGNOSIS — R296 Repeated falls: Secondary | ICD-10-CM | POA: Diagnosis not present

## 2018-05-27 DIAGNOSIS — R410 Disorientation, unspecified: Secondary | ICD-10-CM | POA: Diagnosis not present

## 2018-05-27 DIAGNOSIS — F0280 Dementia in other diseases classified elsewhere without behavioral disturbance: Secondary | ICD-10-CM | POA: Diagnosis not present

## 2018-05-27 DIAGNOSIS — Z66 Do not resuscitate: Secondary | ICD-10-CM | POA: Diagnosis not present

## 2018-05-27 DIAGNOSIS — G300 Alzheimer's disease with early onset: Secondary | ICD-10-CM | POA: Diagnosis not present

## 2018-05-28 DIAGNOSIS — G300 Alzheimer's disease with early onset: Secondary | ICD-10-CM | POA: Diagnosis not present

## 2018-05-28 DIAGNOSIS — I6523 Occlusion and stenosis of bilateral carotid arteries: Secondary | ICD-10-CM | POA: Diagnosis not present

## 2018-05-28 DIAGNOSIS — R634 Abnormal weight loss: Secondary | ICD-10-CM | POA: Diagnosis not present

## 2018-05-28 DIAGNOSIS — I10 Essential (primary) hypertension: Secondary | ICD-10-CM | POA: Diagnosis not present

## 2018-05-28 DIAGNOSIS — F0280 Dementia in other diseases classified elsewhere without behavioral disturbance: Secondary | ICD-10-CM | POA: Diagnosis not present

## 2018-05-28 DIAGNOSIS — R451 Restlessness and agitation: Secondary | ICD-10-CM | POA: Diagnosis not present

## 2018-05-28 DIAGNOSIS — I779 Disorder of arteries and arterioles, unspecified: Secondary | ICD-10-CM | POA: Diagnosis not present

## 2018-05-28 DIAGNOSIS — Z515 Encounter for palliative care: Secondary | ICD-10-CM | POA: Diagnosis not present

## 2018-05-28 DIAGNOSIS — R296 Repeated falls: Secondary | ICD-10-CM | POA: Diagnosis not present

## 2018-05-28 DIAGNOSIS — R410 Disorientation, unspecified: Secondary | ICD-10-CM | POA: Diagnosis not present

## 2018-05-28 DIAGNOSIS — Z66 Do not resuscitate: Secondary | ICD-10-CM | POA: Diagnosis not present

## 2018-05-29 DIAGNOSIS — I6523 Occlusion and stenosis of bilateral carotid arteries: Secondary | ICD-10-CM | POA: Diagnosis not present

## 2018-05-29 DIAGNOSIS — R634 Abnormal weight loss: Secondary | ICD-10-CM | POA: Diagnosis not present

## 2018-05-29 DIAGNOSIS — I779 Disorder of arteries and arterioles, unspecified: Secondary | ICD-10-CM | POA: Diagnosis not present

## 2018-05-29 DIAGNOSIS — F0280 Dementia in other diseases classified elsewhere without behavioral disturbance: Secondary | ICD-10-CM | POA: Diagnosis not present

## 2018-05-29 DIAGNOSIS — Z66 Do not resuscitate: Secondary | ICD-10-CM | POA: Diagnosis not present

## 2018-05-29 DIAGNOSIS — R451 Restlessness and agitation: Secondary | ICD-10-CM | POA: Diagnosis not present

## 2018-05-29 DIAGNOSIS — R296 Repeated falls: Secondary | ICD-10-CM | POA: Diagnosis not present

## 2018-05-29 DIAGNOSIS — I10 Essential (primary) hypertension: Secondary | ICD-10-CM | POA: Diagnosis not present

## 2018-05-29 DIAGNOSIS — Z515 Encounter for palliative care: Secondary | ICD-10-CM | POA: Diagnosis not present

## 2018-05-29 DIAGNOSIS — G300 Alzheimer's disease with early onset: Secondary | ICD-10-CM | POA: Diagnosis not present

## 2018-05-29 DIAGNOSIS — R410 Disorientation, unspecified: Secondary | ICD-10-CM | POA: Diagnosis not present

## 2018-05-30 DIAGNOSIS — I779 Disorder of arteries and arterioles, unspecified: Secondary | ICD-10-CM | POA: Diagnosis not present

## 2018-05-30 DIAGNOSIS — Z515 Encounter for palliative care: Secondary | ICD-10-CM | POA: Diagnosis not present

## 2018-05-30 DIAGNOSIS — I639 Cerebral infarction, unspecified: Secondary | ICD-10-CM | POA: Diagnosis not present

## 2018-05-30 DIAGNOSIS — I6523 Occlusion and stenosis of bilateral carotid arteries: Secondary | ICD-10-CM | POA: Diagnosis not present

## 2018-05-30 DIAGNOSIS — I10 Essential (primary) hypertension: Secondary | ICD-10-CM | POA: Diagnosis not present

## 2018-05-30 DIAGNOSIS — Z66 Do not resuscitate: Secondary | ICD-10-CM | POA: Diagnosis not present

## 2018-05-30 DIAGNOSIS — G300 Alzheimer's disease with early onset: Secondary | ICD-10-CM | POA: Diagnosis not present

## 2018-05-30 DIAGNOSIS — F0280 Dementia in other diseases classified elsewhere without behavioral disturbance: Secondary | ICD-10-CM | POA: Diagnosis not present

## 2018-05-30 DIAGNOSIS — R451 Restlessness and agitation: Secondary | ICD-10-CM | POA: Diagnosis not present

## 2018-05-30 DIAGNOSIS — E785 Hyperlipidemia, unspecified: Secondary | ICD-10-CM | POA: Diagnosis not present

## 2018-05-30 DIAGNOSIS — K5900 Constipation, unspecified: Secondary | ICD-10-CM | POA: Diagnosis not present

## 2018-05-30 DIAGNOSIS — R634 Abnormal weight loss: Secondary | ICD-10-CM | POA: Diagnosis not present

## 2018-05-30 DIAGNOSIS — M6281 Muscle weakness (generalized): Secondary | ICD-10-CM | POA: Diagnosis not present

## 2018-05-30 DIAGNOSIS — R296 Repeated falls: Secondary | ICD-10-CM | POA: Diagnosis not present

## 2018-05-30 DIAGNOSIS — R410 Disorientation, unspecified: Secondary | ICD-10-CM | POA: Diagnosis not present

## 2018-05-31 DIAGNOSIS — R451 Restlessness and agitation: Secondary | ICD-10-CM | POA: Diagnosis not present

## 2018-05-31 DIAGNOSIS — I10 Essential (primary) hypertension: Secondary | ICD-10-CM | POA: Diagnosis not present

## 2018-05-31 DIAGNOSIS — Z515 Encounter for palliative care: Secondary | ICD-10-CM | POA: Diagnosis not present

## 2018-05-31 DIAGNOSIS — R296 Repeated falls: Secondary | ICD-10-CM | POA: Diagnosis not present

## 2018-05-31 DIAGNOSIS — R634 Abnormal weight loss: Secondary | ICD-10-CM | POA: Diagnosis not present

## 2018-05-31 DIAGNOSIS — Z66 Do not resuscitate: Secondary | ICD-10-CM | POA: Diagnosis not present

## 2018-05-31 DIAGNOSIS — I6523 Occlusion and stenosis of bilateral carotid arteries: Secondary | ICD-10-CM | POA: Diagnosis not present

## 2018-05-31 DIAGNOSIS — G300 Alzheimer's disease with early onset: Secondary | ICD-10-CM | POA: Diagnosis not present

## 2018-05-31 DIAGNOSIS — F0280 Dementia in other diseases classified elsewhere without behavioral disturbance: Secondary | ICD-10-CM | POA: Diagnosis not present

## 2018-05-31 DIAGNOSIS — I779 Disorder of arteries and arterioles, unspecified: Secondary | ICD-10-CM | POA: Diagnosis not present

## 2018-05-31 DIAGNOSIS — R410 Disorientation, unspecified: Secondary | ICD-10-CM | POA: Diagnosis not present

## 2018-06-01 DIAGNOSIS — Z515 Encounter for palliative care: Secondary | ICD-10-CM | POA: Diagnosis not present

## 2018-06-01 DIAGNOSIS — R296 Repeated falls: Secondary | ICD-10-CM | POA: Diagnosis not present

## 2018-06-01 DIAGNOSIS — G300 Alzheimer's disease with early onset: Secondary | ICD-10-CM | POA: Diagnosis not present

## 2018-06-01 DIAGNOSIS — I779 Disorder of arteries and arterioles, unspecified: Secondary | ICD-10-CM | POA: Diagnosis not present

## 2018-06-01 DIAGNOSIS — Z66 Do not resuscitate: Secondary | ICD-10-CM | POA: Diagnosis not present

## 2018-06-01 DIAGNOSIS — R634 Abnormal weight loss: Secondary | ICD-10-CM | POA: Diagnosis not present

## 2018-06-01 DIAGNOSIS — R410 Disorientation, unspecified: Secondary | ICD-10-CM | POA: Diagnosis not present

## 2018-06-01 DIAGNOSIS — F0280 Dementia in other diseases classified elsewhere without behavioral disturbance: Secondary | ICD-10-CM | POA: Diagnosis not present

## 2018-06-01 DIAGNOSIS — R451 Restlessness and agitation: Secondary | ICD-10-CM | POA: Diagnosis not present

## 2018-06-01 DIAGNOSIS — I6523 Occlusion and stenosis of bilateral carotid arteries: Secondary | ICD-10-CM | POA: Diagnosis not present

## 2018-06-01 DIAGNOSIS — I10 Essential (primary) hypertension: Secondary | ICD-10-CM | POA: Diagnosis not present

## 2018-06-02 DIAGNOSIS — I6523 Occlusion and stenosis of bilateral carotid arteries: Secondary | ICD-10-CM | POA: Diagnosis not present

## 2018-06-02 DIAGNOSIS — F0280 Dementia in other diseases classified elsewhere without behavioral disturbance: Secondary | ICD-10-CM | POA: Diagnosis not present

## 2018-06-02 DIAGNOSIS — R410 Disorientation, unspecified: Secondary | ICD-10-CM | POA: Diagnosis not present

## 2018-06-02 DIAGNOSIS — Z515 Encounter for palliative care: Secondary | ICD-10-CM | POA: Diagnosis not present

## 2018-06-02 DIAGNOSIS — Z66 Do not resuscitate: Secondary | ICD-10-CM | POA: Diagnosis not present

## 2018-06-02 DIAGNOSIS — I779 Disorder of arteries and arterioles, unspecified: Secondary | ICD-10-CM | POA: Diagnosis not present

## 2018-06-02 DIAGNOSIS — R451 Restlessness and agitation: Secondary | ICD-10-CM | POA: Diagnosis not present

## 2018-06-02 DIAGNOSIS — G300 Alzheimer's disease with early onset: Secondary | ICD-10-CM | POA: Diagnosis not present

## 2018-06-02 DIAGNOSIS — R634 Abnormal weight loss: Secondary | ICD-10-CM | POA: Diagnosis not present

## 2018-06-02 DIAGNOSIS — R296 Repeated falls: Secondary | ICD-10-CM | POA: Diagnosis not present

## 2018-06-02 DIAGNOSIS — I10 Essential (primary) hypertension: Secondary | ICD-10-CM | POA: Diagnosis not present

## 2018-06-03 DIAGNOSIS — R410 Disorientation, unspecified: Secondary | ICD-10-CM | POA: Diagnosis not present

## 2018-06-03 DIAGNOSIS — F0280 Dementia in other diseases classified elsewhere without behavioral disturbance: Secondary | ICD-10-CM | POA: Diagnosis not present

## 2018-06-03 DIAGNOSIS — I6523 Occlusion and stenosis of bilateral carotid arteries: Secondary | ICD-10-CM | POA: Diagnosis not present

## 2018-06-03 DIAGNOSIS — R634 Abnormal weight loss: Secondary | ICD-10-CM | POA: Diagnosis not present

## 2018-06-03 DIAGNOSIS — Z66 Do not resuscitate: Secondary | ICD-10-CM | POA: Diagnosis not present

## 2018-06-03 DIAGNOSIS — I779 Disorder of arteries and arterioles, unspecified: Secondary | ICD-10-CM | POA: Diagnosis not present

## 2018-06-03 DIAGNOSIS — Z515 Encounter for palliative care: Secondary | ICD-10-CM | POA: Diagnosis not present

## 2018-06-03 DIAGNOSIS — I10 Essential (primary) hypertension: Secondary | ICD-10-CM | POA: Diagnosis not present

## 2018-06-03 DIAGNOSIS — R296 Repeated falls: Secondary | ICD-10-CM | POA: Diagnosis not present

## 2018-06-03 DIAGNOSIS — G300 Alzheimer's disease with early onset: Secondary | ICD-10-CM | POA: Diagnosis not present

## 2018-06-03 DIAGNOSIS — R451 Restlessness and agitation: Secondary | ICD-10-CM | POA: Diagnosis not present

## 2018-06-04 DIAGNOSIS — I10 Essential (primary) hypertension: Secondary | ICD-10-CM | POA: Diagnosis not present

## 2018-06-04 DIAGNOSIS — G300 Alzheimer's disease with early onset: Secondary | ICD-10-CM | POA: Diagnosis not present

## 2018-06-04 DIAGNOSIS — Z515 Encounter for palliative care: Secondary | ICD-10-CM | POA: Diagnosis not present

## 2018-06-04 DIAGNOSIS — R634 Abnormal weight loss: Secondary | ICD-10-CM | POA: Diagnosis not present

## 2018-06-04 DIAGNOSIS — I779 Disorder of arteries and arterioles, unspecified: Secondary | ICD-10-CM | POA: Diagnosis not present

## 2018-06-04 DIAGNOSIS — R410 Disorientation, unspecified: Secondary | ICD-10-CM | POA: Diagnosis not present

## 2018-06-04 DIAGNOSIS — Z66 Do not resuscitate: Secondary | ICD-10-CM | POA: Diagnosis not present

## 2018-06-04 DIAGNOSIS — I6523 Occlusion and stenosis of bilateral carotid arteries: Secondary | ICD-10-CM | POA: Diagnosis not present

## 2018-06-04 DIAGNOSIS — F0280 Dementia in other diseases classified elsewhere without behavioral disturbance: Secondary | ICD-10-CM | POA: Diagnosis not present

## 2018-06-04 DIAGNOSIS — R451 Restlessness and agitation: Secondary | ICD-10-CM | POA: Diagnosis not present

## 2018-06-04 DIAGNOSIS — R296 Repeated falls: Secondary | ICD-10-CM | POA: Diagnosis not present

## 2018-06-05 DIAGNOSIS — I779 Disorder of arteries and arterioles, unspecified: Secondary | ICD-10-CM | POA: Diagnosis not present

## 2018-06-05 DIAGNOSIS — R634 Abnormal weight loss: Secondary | ICD-10-CM | POA: Diagnosis not present

## 2018-06-05 DIAGNOSIS — Z515 Encounter for palliative care: Secondary | ICD-10-CM | POA: Diagnosis not present

## 2018-06-05 DIAGNOSIS — G300 Alzheimer's disease with early onset: Secondary | ICD-10-CM | POA: Diagnosis not present

## 2018-06-05 DIAGNOSIS — R451 Restlessness and agitation: Secondary | ICD-10-CM | POA: Diagnosis not present

## 2018-06-05 DIAGNOSIS — R296 Repeated falls: Secondary | ICD-10-CM | POA: Diagnosis not present

## 2018-06-05 DIAGNOSIS — I10 Essential (primary) hypertension: Secondary | ICD-10-CM | POA: Diagnosis not present

## 2018-06-05 DIAGNOSIS — Z66 Do not resuscitate: Secondary | ICD-10-CM | POA: Diagnosis not present

## 2018-06-05 DIAGNOSIS — R410 Disorientation, unspecified: Secondary | ICD-10-CM | POA: Diagnosis not present

## 2018-06-05 DIAGNOSIS — F0280 Dementia in other diseases classified elsewhere without behavioral disturbance: Secondary | ICD-10-CM | POA: Diagnosis not present

## 2018-06-05 DIAGNOSIS — I6523 Occlusion and stenosis of bilateral carotid arteries: Secondary | ICD-10-CM | POA: Diagnosis not present

## 2018-06-06 DIAGNOSIS — G300 Alzheimer's disease with early onset: Secondary | ICD-10-CM | POA: Diagnosis not present

## 2018-06-06 DIAGNOSIS — I6523 Occlusion and stenosis of bilateral carotid arteries: Secondary | ICD-10-CM | POA: Diagnosis not present

## 2018-06-06 DIAGNOSIS — R451 Restlessness and agitation: Secondary | ICD-10-CM | POA: Diagnosis not present

## 2018-06-06 DIAGNOSIS — R410 Disorientation, unspecified: Secondary | ICD-10-CM | POA: Diagnosis not present

## 2018-06-06 DIAGNOSIS — F0280 Dementia in other diseases classified elsewhere without behavioral disturbance: Secondary | ICD-10-CM | POA: Diagnosis not present

## 2018-06-06 DIAGNOSIS — R634 Abnormal weight loss: Secondary | ICD-10-CM | POA: Diagnosis not present

## 2018-06-06 DIAGNOSIS — I10 Essential (primary) hypertension: Secondary | ICD-10-CM | POA: Diagnosis not present

## 2018-06-06 DIAGNOSIS — Z66 Do not resuscitate: Secondary | ICD-10-CM | POA: Diagnosis not present

## 2018-06-06 DIAGNOSIS — R296 Repeated falls: Secondary | ICD-10-CM | POA: Diagnosis not present

## 2018-06-06 DIAGNOSIS — I779 Disorder of arteries and arterioles, unspecified: Secondary | ICD-10-CM | POA: Diagnosis not present

## 2018-06-06 DIAGNOSIS — Z515 Encounter for palliative care: Secondary | ICD-10-CM | POA: Diagnosis not present

## 2018-06-07 DIAGNOSIS — I779 Disorder of arteries and arterioles, unspecified: Secondary | ICD-10-CM | POA: Diagnosis not present

## 2018-06-07 DIAGNOSIS — F0280 Dementia in other diseases classified elsewhere without behavioral disturbance: Secondary | ICD-10-CM | POA: Diagnosis not present

## 2018-06-07 DIAGNOSIS — R296 Repeated falls: Secondary | ICD-10-CM | POA: Diagnosis not present

## 2018-06-07 DIAGNOSIS — R634 Abnormal weight loss: Secondary | ICD-10-CM | POA: Diagnosis not present

## 2018-06-07 DIAGNOSIS — I10 Essential (primary) hypertension: Secondary | ICD-10-CM | POA: Diagnosis not present

## 2018-06-07 DIAGNOSIS — I6523 Occlusion and stenosis of bilateral carotid arteries: Secondary | ICD-10-CM | POA: Diagnosis not present

## 2018-06-07 DIAGNOSIS — G300 Alzheimer's disease with early onset: Secondary | ICD-10-CM | POA: Diagnosis not present

## 2018-06-07 DIAGNOSIS — Z515 Encounter for palliative care: Secondary | ICD-10-CM | POA: Diagnosis not present

## 2018-06-07 DIAGNOSIS — R451 Restlessness and agitation: Secondary | ICD-10-CM | POA: Diagnosis not present

## 2018-06-07 DIAGNOSIS — Z66 Do not resuscitate: Secondary | ICD-10-CM | POA: Diagnosis not present

## 2018-06-07 DIAGNOSIS — R410 Disorientation, unspecified: Secondary | ICD-10-CM | POA: Diagnosis not present

## 2018-06-08 DIAGNOSIS — Z515 Encounter for palliative care: Secondary | ICD-10-CM | POA: Diagnosis not present

## 2018-06-08 DIAGNOSIS — R451 Restlessness and agitation: Secondary | ICD-10-CM | POA: Diagnosis not present

## 2018-06-08 DIAGNOSIS — I6523 Occlusion and stenosis of bilateral carotid arteries: Secondary | ICD-10-CM | POA: Diagnosis not present

## 2018-06-08 DIAGNOSIS — I779 Disorder of arteries and arterioles, unspecified: Secondary | ICD-10-CM | POA: Diagnosis not present

## 2018-06-08 DIAGNOSIS — G300 Alzheimer's disease with early onset: Secondary | ICD-10-CM | POA: Diagnosis not present

## 2018-06-08 DIAGNOSIS — R296 Repeated falls: Secondary | ICD-10-CM | POA: Diagnosis not present

## 2018-06-08 DIAGNOSIS — F0280 Dementia in other diseases classified elsewhere without behavioral disturbance: Secondary | ICD-10-CM | POA: Diagnosis not present

## 2018-06-08 DIAGNOSIS — Z66 Do not resuscitate: Secondary | ICD-10-CM | POA: Diagnosis not present

## 2018-06-08 DIAGNOSIS — R634 Abnormal weight loss: Secondary | ICD-10-CM | POA: Diagnosis not present

## 2018-06-08 DIAGNOSIS — R410 Disorientation, unspecified: Secondary | ICD-10-CM | POA: Diagnosis not present

## 2018-06-08 DIAGNOSIS — I10 Essential (primary) hypertension: Secondary | ICD-10-CM | POA: Diagnosis not present

## 2018-06-09 DIAGNOSIS — I10 Essential (primary) hypertension: Secondary | ICD-10-CM | POA: Diagnosis not present

## 2018-06-09 DIAGNOSIS — I779 Disorder of arteries and arterioles, unspecified: Secondary | ICD-10-CM | POA: Diagnosis not present

## 2018-06-09 DIAGNOSIS — G300 Alzheimer's disease with early onset: Secondary | ICD-10-CM | POA: Diagnosis not present

## 2018-06-09 DIAGNOSIS — R451 Restlessness and agitation: Secondary | ICD-10-CM | POA: Diagnosis not present

## 2018-06-09 DIAGNOSIS — R296 Repeated falls: Secondary | ICD-10-CM | POA: Diagnosis not present

## 2018-06-09 DIAGNOSIS — R634 Abnormal weight loss: Secondary | ICD-10-CM | POA: Diagnosis not present

## 2018-06-09 DIAGNOSIS — Z66 Do not resuscitate: Secondary | ICD-10-CM | POA: Diagnosis not present

## 2018-06-09 DIAGNOSIS — F0280 Dementia in other diseases classified elsewhere without behavioral disturbance: Secondary | ICD-10-CM | POA: Diagnosis not present

## 2018-06-09 DIAGNOSIS — R410 Disorientation, unspecified: Secondary | ICD-10-CM | POA: Diagnosis not present

## 2018-06-09 DIAGNOSIS — I6523 Occlusion and stenosis of bilateral carotid arteries: Secondary | ICD-10-CM | POA: Diagnosis not present

## 2018-06-09 DIAGNOSIS — Z515 Encounter for palliative care: Secondary | ICD-10-CM | POA: Diagnosis not present

## 2018-06-10 DIAGNOSIS — R634 Abnormal weight loss: Secondary | ICD-10-CM | POA: Diagnosis not present

## 2018-06-10 DIAGNOSIS — R410 Disorientation, unspecified: Secondary | ICD-10-CM | POA: Diagnosis not present

## 2018-06-10 DIAGNOSIS — I6523 Occlusion and stenosis of bilateral carotid arteries: Secondary | ICD-10-CM | POA: Diagnosis not present

## 2018-06-10 DIAGNOSIS — Z515 Encounter for palliative care: Secondary | ICD-10-CM | POA: Diagnosis not present

## 2018-06-10 DIAGNOSIS — Z66 Do not resuscitate: Secondary | ICD-10-CM | POA: Diagnosis not present

## 2018-06-10 DIAGNOSIS — R451 Restlessness and agitation: Secondary | ICD-10-CM | POA: Diagnosis not present

## 2018-06-10 DIAGNOSIS — G300 Alzheimer's disease with early onset: Secondary | ICD-10-CM | POA: Diagnosis not present

## 2018-06-10 DIAGNOSIS — F0280 Dementia in other diseases classified elsewhere without behavioral disturbance: Secondary | ICD-10-CM | POA: Diagnosis not present

## 2018-06-10 DIAGNOSIS — I779 Disorder of arteries and arterioles, unspecified: Secondary | ICD-10-CM | POA: Diagnosis not present

## 2018-06-10 DIAGNOSIS — R296 Repeated falls: Secondary | ICD-10-CM | POA: Diagnosis not present

## 2018-06-10 DIAGNOSIS — I10 Essential (primary) hypertension: Secondary | ICD-10-CM | POA: Diagnosis not present

## 2018-06-11 DIAGNOSIS — R634 Abnormal weight loss: Secondary | ICD-10-CM | POA: Diagnosis not present

## 2018-06-11 DIAGNOSIS — R451 Restlessness and agitation: Secondary | ICD-10-CM | POA: Diagnosis not present

## 2018-06-11 DIAGNOSIS — R410 Disorientation, unspecified: Secondary | ICD-10-CM | POA: Diagnosis not present

## 2018-06-11 DIAGNOSIS — R296 Repeated falls: Secondary | ICD-10-CM | POA: Diagnosis not present

## 2018-06-11 DIAGNOSIS — Z515 Encounter for palliative care: Secondary | ICD-10-CM | POA: Diagnosis not present

## 2018-06-11 DIAGNOSIS — Z66 Do not resuscitate: Secondary | ICD-10-CM | POA: Diagnosis not present

## 2018-06-11 DIAGNOSIS — I6523 Occlusion and stenosis of bilateral carotid arteries: Secondary | ICD-10-CM | POA: Diagnosis not present

## 2018-06-11 DIAGNOSIS — I10 Essential (primary) hypertension: Secondary | ICD-10-CM | POA: Diagnosis not present

## 2018-06-11 DIAGNOSIS — G300 Alzheimer's disease with early onset: Secondary | ICD-10-CM | POA: Diagnosis not present

## 2018-06-11 DIAGNOSIS — F0280 Dementia in other diseases classified elsewhere without behavioral disturbance: Secondary | ICD-10-CM | POA: Diagnosis not present

## 2018-06-11 DIAGNOSIS — I779 Disorder of arteries and arterioles, unspecified: Secondary | ICD-10-CM | POA: Diagnosis not present

## 2018-06-12 DIAGNOSIS — Z515 Encounter for palliative care: Secondary | ICD-10-CM | POA: Diagnosis not present

## 2018-06-12 DIAGNOSIS — F0280 Dementia in other diseases classified elsewhere without behavioral disturbance: Secondary | ICD-10-CM | POA: Diagnosis not present

## 2018-06-12 DIAGNOSIS — Z66 Do not resuscitate: Secondary | ICD-10-CM | POA: Diagnosis not present

## 2018-06-12 DIAGNOSIS — I779 Disorder of arteries and arterioles, unspecified: Secondary | ICD-10-CM | POA: Diagnosis not present

## 2018-06-12 DIAGNOSIS — I6523 Occlusion and stenosis of bilateral carotid arteries: Secondary | ICD-10-CM | POA: Diagnosis not present

## 2018-06-12 DIAGNOSIS — G300 Alzheimer's disease with early onset: Secondary | ICD-10-CM | POA: Diagnosis not present

## 2018-06-12 DIAGNOSIS — I10 Essential (primary) hypertension: Secondary | ICD-10-CM | POA: Diagnosis not present

## 2018-06-12 DIAGNOSIS — R410 Disorientation, unspecified: Secondary | ICD-10-CM | POA: Diagnosis not present

## 2018-06-12 DIAGNOSIS — R634 Abnormal weight loss: Secondary | ICD-10-CM | POA: Diagnosis not present

## 2018-06-12 DIAGNOSIS — R296 Repeated falls: Secondary | ICD-10-CM | POA: Diagnosis not present

## 2018-06-12 DIAGNOSIS — R451 Restlessness and agitation: Secondary | ICD-10-CM | POA: Diagnosis not present

## 2018-06-13 DIAGNOSIS — Z515 Encounter for palliative care: Secondary | ICD-10-CM | POA: Diagnosis not present

## 2018-06-13 DIAGNOSIS — F0280 Dementia in other diseases classified elsewhere without behavioral disturbance: Secondary | ICD-10-CM | POA: Diagnosis not present

## 2018-06-13 DIAGNOSIS — I10 Essential (primary) hypertension: Secondary | ICD-10-CM | POA: Diagnosis not present

## 2018-06-13 DIAGNOSIS — R410 Disorientation, unspecified: Secondary | ICD-10-CM | POA: Diagnosis not present

## 2018-06-13 DIAGNOSIS — I6523 Occlusion and stenosis of bilateral carotid arteries: Secondary | ICD-10-CM | POA: Diagnosis not present

## 2018-06-13 DIAGNOSIS — Z66 Do not resuscitate: Secondary | ICD-10-CM | POA: Diagnosis not present

## 2018-06-13 DIAGNOSIS — R296 Repeated falls: Secondary | ICD-10-CM | POA: Diagnosis not present

## 2018-06-13 DIAGNOSIS — R634 Abnormal weight loss: Secondary | ICD-10-CM | POA: Diagnosis not present

## 2018-06-13 DIAGNOSIS — G300 Alzheimer's disease with early onset: Secondary | ICD-10-CM | POA: Diagnosis not present

## 2018-06-13 DIAGNOSIS — I779 Disorder of arteries and arterioles, unspecified: Secondary | ICD-10-CM | POA: Diagnosis not present

## 2018-06-13 DIAGNOSIS — R451 Restlessness and agitation: Secondary | ICD-10-CM | POA: Diagnosis not present

## 2018-06-14 DIAGNOSIS — Z66 Do not resuscitate: Secondary | ICD-10-CM | POA: Diagnosis not present

## 2018-06-14 DIAGNOSIS — R410 Disorientation, unspecified: Secondary | ICD-10-CM | POA: Diagnosis not present

## 2018-06-14 DIAGNOSIS — I779 Disorder of arteries and arterioles, unspecified: Secondary | ICD-10-CM | POA: Diagnosis not present

## 2018-06-14 DIAGNOSIS — R634 Abnormal weight loss: Secondary | ICD-10-CM | POA: Diagnosis not present

## 2018-06-14 DIAGNOSIS — R296 Repeated falls: Secondary | ICD-10-CM | POA: Diagnosis not present

## 2018-06-14 DIAGNOSIS — I10 Essential (primary) hypertension: Secondary | ICD-10-CM | POA: Diagnosis not present

## 2018-06-14 DIAGNOSIS — Z515 Encounter for palliative care: Secondary | ICD-10-CM | POA: Diagnosis not present

## 2018-06-14 DIAGNOSIS — R451 Restlessness and agitation: Secondary | ICD-10-CM | POA: Diagnosis not present

## 2018-06-14 DIAGNOSIS — F0280 Dementia in other diseases classified elsewhere without behavioral disturbance: Secondary | ICD-10-CM | POA: Diagnosis not present

## 2018-06-14 DIAGNOSIS — G300 Alzheimer's disease with early onset: Secondary | ICD-10-CM | POA: Diagnosis not present

## 2018-06-14 DIAGNOSIS — I6523 Occlusion and stenosis of bilateral carotid arteries: Secondary | ICD-10-CM | POA: Diagnosis not present

## 2018-06-15 DIAGNOSIS — I10 Essential (primary) hypertension: Secondary | ICD-10-CM | POA: Diagnosis not present

## 2018-06-15 DIAGNOSIS — R410 Disorientation, unspecified: Secondary | ICD-10-CM | POA: Diagnosis not present

## 2018-06-15 DIAGNOSIS — R451 Restlessness and agitation: Secondary | ICD-10-CM | POA: Diagnosis not present

## 2018-06-15 DIAGNOSIS — I6523 Occlusion and stenosis of bilateral carotid arteries: Secondary | ICD-10-CM | POA: Diagnosis not present

## 2018-06-15 DIAGNOSIS — F0280 Dementia in other diseases classified elsewhere without behavioral disturbance: Secondary | ICD-10-CM | POA: Diagnosis not present

## 2018-06-15 DIAGNOSIS — Z515 Encounter for palliative care: Secondary | ICD-10-CM | POA: Diagnosis not present

## 2018-06-15 DIAGNOSIS — Z66 Do not resuscitate: Secondary | ICD-10-CM | POA: Diagnosis not present

## 2018-06-15 DIAGNOSIS — G300 Alzheimer's disease with early onset: Secondary | ICD-10-CM | POA: Diagnosis not present

## 2018-06-15 DIAGNOSIS — R296 Repeated falls: Secondary | ICD-10-CM | POA: Diagnosis not present

## 2018-06-15 DIAGNOSIS — I779 Disorder of arteries and arterioles, unspecified: Secondary | ICD-10-CM | POA: Diagnosis not present

## 2018-06-15 DIAGNOSIS — R634 Abnormal weight loss: Secondary | ICD-10-CM | POA: Diagnosis not present

## 2018-06-16 DIAGNOSIS — Z66 Do not resuscitate: Secondary | ICD-10-CM | POA: Diagnosis not present

## 2018-06-16 DIAGNOSIS — F0280 Dementia in other diseases classified elsewhere without behavioral disturbance: Secondary | ICD-10-CM | POA: Diagnosis not present

## 2018-06-16 DIAGNOSIS — G300 Alzheimer's disease with early onset: Secondary | ICD-10-CM | POA: Diagnosis not present

## 2018-06-16 DIAGNOSIS — I6523 Occlusion and stenosis of bilateral carotid arteries: Secondary | ICD-10-CM | POA: Diagnosis not present

## 2018-06-16 DIAGNOSIS — I779 Disorder of arteries and arterioles, unspecified: Secondary | ICD-10-CM | POA: Diagnosis not present

## 2018-06-16 DIAGNOSIS — R296 Repeated falls: Secondary | ICD-10-CM | POA: Diagnosis not present

## 2018-06-16 DIAGNOSIS — R410 Disorientation, unspecified: Secondary | ICD-10-CM | POA: Diagnosis not present

## 2018-06-16 DIAGNOSIS — R634 Abnormal weight loss: Secondary | ICD-10-CM | POA: Diagnosis not present

## 2018-06-16 DIAGNOSIS — I10 Essential (primary) hypertension: Secondary | ICD-10-CM | POA: Diagnosis not present

## 2018-06-16 DIAGNOSIS — R451 Restlessness and agitation: Secondary | ICD-10-CM | POA: Diagnosis not present

## 2018-06-16 DIAGNOSIS — Z515 Encounter for palliative care: Secondary | ICD-10-CM | POA: Diagnosis not present

## 2018-06-17 DIAGNOSIS — R296 Repeated falls: Secondary | ICD-10-CM | POA: Diagnosis not present

## 2018-06-17 DIAGNOSIS — Z515 Encounter for palliative care: Secondary | ICD-10-CM | POA: Diagnosis not present

## 2018-06-17 DIAGNOSIS — R451 Restlessness and agitation: Secondary | ICD-10-CM | POA: Diagnosis not present

## 2018-06-17 DIAGNOSIS — I10 Essential (primary) hypertension: Secondary | ICD-10-CM | POA: Diagnosis not present

## 2018-06-17 DIAGNOSIS — G300 Alzheimer's disease with early onset: Secondary | ICD-10-CM | POA: Diagnosis not present

## 2018-06-17 DIAGNOSIS — I6523 Occlusion and stenosis of bilateral carotid arteries: Secondary | ICD-10-CM | POA: Diagnosis not present

## 2018-06-17 DIAGNOSIS — I779 Disorder of arteries and arterioles, unspecified: Secondary | ICD-10-CM | POA: Diagnosis not present

## 2018-06-17 DIAGNOSIS — R634 Abnormal weight loss: Secondary | ICD-10-CM | POA: Diagnosis not present

## 2018-06-17 DIAGNOSIS — F0280 Dementia in other diseases classified elsewhere without behavioral disturbance: Secondary | ICD-10-CM | POA: Diagnosis not present

## 2018-06-17 DIAGNOSIS — Z66 Do not resuscitate: Secondary | ICD-10-CM | POA: Diagnosis not present

## 2018-06-17 DIAGNOSIS — R410 Disorientation, unspecified: Secondary | ICD-10-CM | POA: Diagnosis not present

## 2018-06-18 DIAGNOSIS — R634 Abnormal weight loss: Secondary | ICD-10-CM | POA: Diagnosis not present

## 2018-06-18 DIAGNOSIS — I779 Disorder of arteries and arterioles, unspecified: Secondary | ICD-10-CM | POA: Diagnosis not present

## 2018-06-18 DIAGNOSIS — R410 Disorientation, unspecified: Secondary | ICD-10-CM | POA: Diagnosis not present

## 2018-06-18 DIAGNOSIS — G300 Alzheimer's disease with early onset: Secondary | ICD-10-CM | POA: Diagnosis not present

## 2018-06-18 DIAGNOSIS — I6523 Occlusion and stenosis of bilateral carotid arteries: Secondary | ICD-10-CM | POA: Diagnosis not present

## 2018-06-18 DIAGNOSIS — R296 Repeated falls: Secondary | ICD-10-CM | POA: Diagnosis not present

## 2018-06-18 DIAGNOSIS — Z515 Encounter for palliative care: Secondary | ICD-10-CM | POA: Diagnosis not present

## 2018-06-18 DIAGNOSIS — F0280 Dementia in other diseases classified elsewhere without behavioral disturbance: Secondary | ICD-10-CM | POA: Diagnosis not present

## 2018-06-18 DIAGNOSIS — I10 Essential (primary) hypertension: Secondary | ICD-10-CM | POA: Diagnosis not present

## 2018-06-18 DIAGNOSIS — R451 Restlessness and agitation: Secondary | ICD-10-CM | POA: Diagnosis not present

## 2018-06-18 DIAGNOSIS — Z66 Do not resuscitate: Secondary | ICD-10-CM | POA: Diagnosis not present

## 2018-06-19 DIAGNOSIS — I779 Disorder of arteries and arterioles, unspecified: Secondary | ICD-10-CM | POA: Diagnosis not present

## 2018-06-19 DIAGNOSIS — G300 Alzheimer's disease with early onset: Secondary | ICD-10-CM | POA: Diagnosis not present

## 2018-06-19 DIAGNOSIS — R634 Abnormal weight loss: Secondary | ICD-10-CM | POA: Diagnosis not present

## 2018-06-19 DIAGNOSIS — F0280 Dementia in other diseases classified elsewhere without behavioral disturbance: Secondary | ICD-10-CM | POA: Diagnosis not present

## 2018-06-19 DIAGNOSIS — R410 Disorientation, unspecified: Secondary | ICD-10-CM | POA: Diagnosis not present

## 2018-06-19 DIAGNOSIS — I10 Essential (primary) hypertension: Secondary | ICD-10-CM | POA: Diagnosis not present

## 2018-06-19 DIAGNOSIS — R451 Restlessness and agitation: Secondary | ICD-10-CM | POA: Diagnosis not present

## 2018-06-19 DIAGNOSIS — R296 Repeated falls: Secondary | ICD-10-CM | POA: Diagnosis not present

## 2018-06-19 DIAGNOSIS — I6523 Occlusion and stenosis of bilateral carotid arteries: Secondary | ICD-10-CM | POA: Diagnosis not present

## 2018-06-19 DIAGNOSIS — Z66 Do not resuscitate: Secondary | ICD-10-CM | POA: Diagnosis not present

## 2018-06-19 DIAGNOSIS — Z515 Encounter for palliative care: Secondary | ICD-10-CM | POA: Diagnosis not present

## 2018-06-20 DIAGNOSIS — R451 Restlessness and agitation: Secondary | ICD-10-CM | POA: Diagnosis not present

## 2018-06-20 DIAGNOSIS — F0280 Dementia in other diseases classified elsewhere without behavioral disturbance: Secondary | ICD-10-CM | POA: Diagnosis not present

## 2018-06-20 DIAGNOSIS — R410 Disorientation, unspecified: Secondary | ICD-10-CM | POA: Diagnosis not present

## 2018-06-20 DIAGNOSIS — I779 Disorder of arteries and arterioles, unspecified: Secondary | ICD-10-CM | POA: Diagnosis not present

## 2018-06-20 DIAGNOSIS — Z66 Do not resuscitate: Secondary | ICD-10-CM | POA: Diagnosis not present

## 2018-06-20 DIAGNOSIS — R634 Abnormal weight loss: Secondary | ICD-10-CM | POA: Diagnosis not present

## 2018-06-20 DIAGNOSIS — G300 Alzheimer's disease with early onset: Secondary | ICD-10-CM | POA: Diagnosis not present

## 2018-06-20 DIAGNOSIS — I10 Essential (primary) hypertension: Secondary | ICD-10-CM | POA: Diagnosis not present

## 2018-06-20 DIAGNOSIS — R296 Repeated falls: Secondary | ICD-10-CM | POA: Diagnosis not present

## 2018-06-20 DIAGNOSIS — I6523 Occlusion and stenosis of bilateral carotid arteries: Secondary | ICD-10-CM | POA: Diagnosis not present

## 2018-06-20 DIAGNOSIS — Z515 Encounter for palliative care: Secondary | ICD-10-CM | POA: Diagnosis not present

## 2018-06-21 DIAGNOSIS — Z66 Do not resuscitate: Secondary | ICD-10-CM | POA: Diagnosis not present

## 2018-06-21 DIAGNOSIS — I10 Essential (primary) hypertension: Secondary | ICD-10-CM | POA: Diagnosis not present

## 2018-06-21 DIAGNOSIS — R410 Disorientation, unspecified: Secondary | ICD-10-CM | POA: Diagnosis not present

## 2018-06-21 DIAGNOSIS — I6523 Occlusion and stenosis of bilateral carotid arteries: Secondary | ICD-10-CM | POA: Diagnosis not present

## 2018-06-21 DIAGNOSIS — G300 Alzheimer's disease with early onset: Secondary | ICD-10-CM | POA: Diagnosis not present

## 2018-06-21 DIAGNOSIS — F0280 Dementia in other diseases classified elsewhere without behavioral disturbance: Secondary | ICD-10-CM | POA: Diagnosis not present

## 2018-06-21 DIAGNOSIS — I779 Disorder of arteries and arterioles, unspecified: Secondary | ICD-10-CM | POA: Diagnosis not present

## 2018-06-21 DIAGNOSIS — Z515 Encounter for palliative care: Secondary | ICD-10-CM | POA: Diagnosis not present

## 2018-06-21 DIAGNOSIS — R634 Abnormal weight loss: Secondary | ICD-10-CM | POA: Diagnosis not present

## 2018-06-21 DIAGNOSIS — R451 Restlessness and agitation: Secondary | ICD-10-CM | POA: Diagnosis not present

## 2018-06-21 DIAGNOSIS — R296 Repeated falls: Secondary | ICD-10-CM | POA: Diagnosis not present

## 2018-06-22 DIAGNOSIS — Z515 Encounter for palliative care: Secondary | ICD-10-CM | POA: Diagnosis not present

## 2018-06-22 DIAGNOSIS — I6523 Occlusion and stenosis of bilateral carotid arteries: Secondary | ICD-10-CM | POA: Diagnosis not present

## 2018-06-22 DIAGNOSIS — R410 Disorientation, unspecified: Secondary | ICD-10-CM | POA: Diagnosis not present

## 2018-06-22 DIAGNOSIS — F0280 Dementia in other diseases classified elsewhere without behavioral disturbance: Secondary | ICD-10-CM | POA: Diagnosis not present

## 2018-06-22 DIAGNOSIS — I10 Essential (primary) hypertension: Secondary | ICD-10-CM | POA: Diagnosis not present

## 2018-06-22 DIAGNOSIS — R634 Abnormal weight loss: Secondary | ICD-10-CM | POA: Diagnosis not present

## 2018-06-22 DIAGNOSIS — R451 Restlessness and agitation: Secondary | ICD-10-CM | POA: Diagnosis not present

## 2018-06-22 DIAGNOSIS — R296 Repeated falls: Secondary | ICD-10-CM | POA: Diagnosis not present

## 2018-06-22 DIAGNOSIS — Z66 Do not resuscitate: Secondary | ICD-10-CM | POA: Diagnosis not present

## 2018-06-22 DIAGNOSIS — G300 Alzheimer's disease with early onset: Secondary | ICD-10-CM | POA: Diagnosis not present

## 2018-06-22 DIAGNOSIS — I779 Disorder of arteries and arterioles, unspecified: Secondary | ICD-10-CM | POA: Diagnosis not present

## 2018-06-23 DIAGNOSIS — F0280 Dementia in other diseases classified elsewhere without behavioral disturbance: Secondary | ICD-10-CM | POA: Diagnosis not present

## 2018-06-23 DIAGNOSIS — Z66 Do not resuscitate: Secondary | ICD-10-CM | POA: Diagnosis not present

## 2018-06-23 DIAGNOSIS — R634 Abnormal weight loss: Secondary | ICD-10-CM | POA: Diagnosis not present

## 2018-06-23 DIAGNOSIS — I10 Essential (primary) hypertension: Secondary | ICD-10-CM | POA: Diagnosis not present

## 2018-06-23 DIAGNOSIS — I779 Disorder of arteries and arterioles, unspecified: Secondary | ICD-10-CM | POA: Diagnosis not present

## 2018-06-23 DIAGNOSIS — R410 Disorientation, unspecified: Secondary | ICD-10-CM | POA: Diagnosis not present

## 2018-06-23 DIAGNOSIS — R451 Restlessness and agitation: Secondary | ICD-10-CM | POA: Diagnosis not present

## 2018-06-23 DIAGNOSIS — I6523 Occlusion and stenosis of bilateral carotid arteries: Secondary | ICD-10-CM | POA: Diagnosis not present

## 2018-06-23 DIAGNOSIS — G300 Alzheimer's disease with early onset: Secondary | ICD-10-CM | POA: Diagnosis not present

## 2018-06-23 DIAGNOSIS — Z515 Encounter for palliative care: Secondary | ICD-10-CM | POA: Diagnosis not present

## 2018-06-23 DIAGNOSIS — R296 Repeated falls: Secondary | ICD-10-CM | POA: Diagnosis not present

## 2018-06-24 DIAGNOSIS — I6523 Occlusion and stenosis of bilateral carotid arteries: Secondary | ICD-10-CM | POA: Diagnosis not present

## 2018-06-24 DIAGNOSIS — R451 Restlessness and agitation: Secondary | ICD-10-CM | POA: Diagnosis not present

## 2018-06-24 DIAGNOSIS — G300 Alzheimer's disease with early onset: Secondary | ICD-10-CM | POA: Diagnosis not present

## 2018-06-24 DIAGNOSIS — F0280 Dementia in other diseases classified elsewhere without behavioral disturbance: Secondary | ICD-10-CM | POA: Diagnosis not present

## 2018-06-24 DIAGNOSIS — R410 Disorientation, unspecified: Secondary | ICD-10-CM | POA: Diagnosis not present

## 2018-06-24 DIAGNOSIS — I779 Disorder of arteries and arterioles, unspecified: Secondary | ICD-10-CM | POA: Diagnosis not present

## 2018-06-24 DIAGNOSIS — R296 Repeated falls: Secondary | ICD-10-CM | POA: Diagnosis not present

## 2018-06-24 DIAGNOSIS — Z515 Encounter for palliative care: Secondary | ICD-10-CM | POA: Diagnosis not present

## 2018-06-24 DIAGNOSIS — I10 Essential (primary) hypertension: Secondary | ICD-10-CM | POA: Diagnosis not present

## 2018-06-24 DIAGNOSIS — Z66 Do not resuscitate: Secondary | ICD-10-CM | POA: Diagnosis not present

## 2018-06-24 DIAGNOSIS — R634 Abnormal weight loss: Secondary | ICD-10-CM | POA: Diagnosis not present

## 2018-06-25 DIAGNOSIS — G300 Alzheimer's disease with early onset: Secondary | ICD-10-CM | POA: Diagnosis not present

## 2018-06-25 DIAGNOSIS — Z66 Do not resuscitate: Secondary | ICD-10-CM | POA: Diagnosis not present

## 2018-06-25 DIAGNOSIS — F0280 Dementia in other diseases classified elsewhere without behavioral disturbance: Secondary | ICD-10-CM | POA: Diagnosis not present

## 2018-06-25 DIAGNOSIS — Z515 Encounter for palliative care: Secondary | ICD-10-CM | POA: Diagnosis not present

## 2018-06-25 DIAGNOSIS — I6523 Occlusion and stenosis of bilateral carotid arteries: Secondary | ICD-10-CM | POA: Diagnosis not present

## 2018-06-25 DIAGNOSIS — R451 Restlessness and agitation: Secondary | ICD-10-CM | POA: Diagnosis not present

## 2018-06-25 DIAGNOSIS — R410 Disorientation, unspecified: Secondary | ICD-10-CM | POA: Diagnosis not present

## 2018-06-25 DIAGNOSIS — R634 Abnormal weight loss: Secondary | ICD-10-CM | POA: Diagnosis not present

## 2018-06-25 DIAGNOSIS — R296 Repeated falls: Secondary | ICD-10-CM | POA: Diagnosis not present

## 2018-06-25 DIAGNOSIS — I779 Disorder of arteries and arterioles, unspecified: Secondary | ICD-10-CM | POA: Diagnosis not present

## 2018-06-25 DIAGNOSIS — I10 Essential (primary) hypertension: Secondary | ICD-10-CM | POA: Diagnosis not present

## 2018-06-26 DIAGNOSIS — I6523 Occlusion and stenosis of bilateral carotid arteries: Secondary | ICD-10-CM | POA: Diagnosis not present

## 2018-06-26 DIAGNOSIS — F0280 Dementia in other diseases classified elsewhere without behavioral disturbance: Secondary | ICD-10-CM | POA: Diagnosis not present

## 2018-06-26 DIAGNOSIS — Z66 Do not resuscitate: Secondary | ICD-10-CM | POA: Diagnosis not present

## 2018-06-26 DIAGNOSIS — G300 Alzheimer's disease with early onset: Secondary | ICD-10-CM | POA: Diagnosis not present

## 2018-06-26 DIAGNOSIS — R634 Abnormal weight loss: Secondary | ICD-10-CM | POA: Diagnosis not present

## 2018-06-26 DIAGNOSIS — I10 Essential (primary) hypertension: Secondary | ICD-10-CM | POA: Diagnosis not present

## 2018-06-26 DIAGNOSIS — R451 Restlessness and agitation: Secondary | ICD-10-CM | POA: Diagnosis not present

## 2018-06-26 DIAGNOSIS — R296 Repeated falls: Secondary | ICD-10-CM | POA: Diagnosis not present

## 2018-06-26 DIAGNOSIS — R410 Disorientation, unspecified: Secondary | ICD-10-CM | POA: Diagnosis not present

## 2018-06-26 DIAGNOSIS — Z515 Encounter for palliative care: Secondary | ICD-10-CM | POA: Diagnosis not present

## 2018-06-26 DIAGNOSIS — I779 Disorder of arteries and arterioles, unspecified: Secondary | ICD-10-CM | POA: Diagnosis not present

## 2018-06-27 DIAGNOSIS — R634 Abnormal weight loss: Secondary | ICD-10-CM | POA: Diagnosis not present

## 2018-06-27 DIAGNOSIS — Z66 Do not resuscitate: Secondary | ICD-10-CM | POA: Diagnosis not present

## 2018-06-27 DIAGNOSIS — I6523 Occlusion and stenosis of bilateral carotid arteries: Secondary | ICD-10-CM | POA: Diagnosis not present

## 2018-06-27 DIAGNOSIS — R451 Restlessness and agitation: Secondary | ICD-10-CM | POA: Diagnosis not present

## 2018-06-27 DIAGNOSIS — I10 Essential (primary) hypertension: Secondary | ICD-10-CM | POA: Diagnosis not present

## 2018-06-27 DIAGNOSIS — G300 Alzheimer's disease with early onset: Secondary | ICD-10-CM | POA: Diagnosis not present

## 2018-06-27 DIAGNOSIS — Z515 Encounter for palliative care: Secondary | ICD-10-CM | POA: Diagnosis not present

## 2018-06-27 DIAGNOSIS — I779 Disorder of arteries and arterioles, unspecified: Secondary | ICD-10-CM | POA: Diagnosis not present

## 2018-06-27 DIAGNOSIS — R410 Disorientation, unspecified: Secondary | ICD-10-CM | POA: Diagnosis not present

## 2018-06-27 DIAGNOSIS — R296 Repeated falls: Secondary | ICD-10-CM | POA: Diagnosis not present

## 2018-06-27 DIAGNOSIS — F0280 Dementia in other diseases classified elsewhere without behavioral disturbance: Secondary | ICD-10-CM | POA: Diagnosis not present

## 2018-06-28 DIAGNOSIS — I10 Essential (primary) hypertension: Secondary | ICD-10-CM | POA: Diagnosis not present

## 2018-06-28 DIAGNOSIS — I6523 Occlusion and stenosis of bilateral carotid arteries: Secondary | ICD-10-CM | POA: Diagnosis not present

## 2018-06-28 DIAGNOSIS — G300 Alzheimer's disease with early onset: Secondary | ICD-10-CM | POA: Diagnosis not present

## 2018-06-28 DIAGNOSIS — Z515 Encounter for palliative care: Secondary | ICD-10-CM | POA: Diagnosis not present

## 2018-06-28 DIAGNOSIS — R634 Abnormal weight loss: Secondary | ICD-10-CM | POA: Diagnosis not present

## 2018-06-28 DIAGNOSIS — I779 Disorder of arteries and arterioles, unspecified: Secondary | ICD-10-CM | POA: Diagnosis not present

## 2018-06-28 DIAGNOSIS — R451 Restlessness and agitation: Secondary | ICD-10-CM | POA: Diagnosis not present

## 2018-06-28 DIAGNOSIS — F0280 Dementia in other diseases classified elsewhere without behavioral disturbance: Secondary | ICD-10-CM | POA: Diagnosis not present

## 2018-06-28 DIAGNOSIS — Z66 Do not resuscitate: Secondary | ICD-10-CM | POA: Diagnosis not present

## 2018-06-28 DIAGNOSIS — R296 Repeated falls: Secondary | ICD-10-CM | POA: Diagnosis not present

## 2018-06-28 DIAGNOSIS — R410 Disorientation, unspecified: Secondary | ICD-10-CM | POA: Diagnosis not present

## 2018-06-29 DIAGNOSIS — Z66 Do not resuscitate: Secondary | ICD-10-CM | POA: Diagnosis not present

## 2018-06-29 DIAGNOSIS — R410 Disorientation, unspecified: Secondary | ICD-10-CM | POA: Diagnosis not present

## 2018-06-29 DIAGNOSIS — G300 Alzheimer's disease with early onset: Secondary | ICD-10-CM | POA: Diagnosis not present

## 2018-06-29 DIAGNOSIS — I6523 Occlusion and stenosis of bilateral carotid arteries: Secondary | ICD-10-CM | POA: Diagnosis not present

## 2018-06-29 DIAGNOSIS — R296 Repeated falls: Secondary | ICD-10-CM | POA: Diagnosis not present

## 2018-06-29 DIAGNOSIS — R451 Restlessness and agitation: Secondary | ICD-10-CM | POA: Diagnosis not present

## 2018-06-29 DIAGNOSIS — Z515 Encounter for palliative care: Secondary | ICD-10-CM | POA: Diagnosis not present

## 2018-06-29 DIAGNOSIS — I779 Disorder of arteries and arterioles, unspecified: Secondary | ICD-10-CM | POA: Diagnosis not present

## 2018-06-29 DIAGNOSIS — R634 Abnormal weight loss: Secondary | ICD-10-CM | POA: Diagnosis not present

## 2018-06-29 DIAGNOSIS — I10 Essential (primary) hypertension: Secondary | ICD-10-CM | POA: Diagnosis not present

## 2018-06-29 DIAGNOSIS — F0280 Dementia in other diseases classified elsewhere without behavioral disturbance: Secondary | ICD-10-CM | POA: Diagnosis not present

## 2018-06-30 DIAGNOSIS — G300 Alzheimer's disease with early onset: Secondary | ICD-10-CM | POA: Diagnosis not present

## 2018-06-30 DIAGNOSIS — I10 Essential (primary) hypertension: Secondary | ICD-10-CM | POA: Diagnosis not present

## 2018-06-30 DIAGNOSIS — R451 Restlessness and agitation: Secondary | ICD-10-CM | POA: Diagnosis not present

## 2018-06-30 DIAGNOSIS — I779 Disorder of arteries and arterioles, unspecified: Secondary | ICD-10-CM | POA: Diagnosis not present

## 2018-06-30 DIAGNOSIS — I6523 Occlusion and stenosis of bilateral carotid arteries: Secondary | ICD-10-CM | POA: Diagnosis not present

## 2018-06-30 DIAGNOSIS — R410 Disorientation, unspecified: Secondary | ICD-10-CM | POA: Diagnosis not present

## 2018-06-30 DIAGNOSIS — Z515 Encounter for palliative care: Secondary | ICD-10-CM | POA: Diagnosis not present

## 2018-06-30 DIAGNOSIS — Z66 Do not resuscitate: Secondary | ICD-10-CM | POA: Diagnosis not present

## 2018-06-30 DIAGNOSIS — F0280 Dementia in other diseases classified elsewhere without behavioral disturbance: Secondary | ICD-10-CM | POA: Diagnosis not present

## 2018-06-30 DIAGNOSIS — R634 Abnormal weight loss: Secondary | ICD-10-CM | POA: Diagnosis not present

## 2018-06-30 DIAGNOSIS — R296 Repeated falls: Secondary | ICD-10-CM | POA: Diagnosis not present

## 2018-07-01 DIAGNOSIS — R451 Restlessness and agitation: Secondary | ICD-10-CM | POA: Diagnosis not present

## 2018-07-01 DIAGNOSIS — Z515 Encounter for palliative care: Secondary | ICD-10-CM | POA: Diagnosis not present

## 2018-07-01 DIAGNOSIS — F0280 Dementia in other diseases classified elsewhere without behavioral disturbance: Secondary | ICD-10-CM | POA: Diagnosis not present

## 2018-07-01 DIAGNOSIS — I639 Cerebral infarction, unspecified: Secondary | ICD-10-CM | POA: Diagnosis not present

## 2018-07-01 DIAGNOSIS — Z66 Do not resuscitate: Secondary | ICD-10-CM | POA: Diagnosis not present

## 2018-07-01 DIAGNOSIS — R410 Disorientation, unspecified: Secondary | ICD-10-CM | POA: Diagnosis not present

## 2018-07-01 DIAGNOSIS — R54 Age-related physical debility: Secondary | ICD-10-CM | POA: Diagnosis not present

## 2018-07-01 DIAGNOSIS — I779 Disorder of arteries and arterioles, unspecified: Secondary | ICD-10-CM | POA: Diagnosis not present

## 2018-07-01 DIAGNOSIS — I10 Essential (primary) hypertension: Secondary | ICD-10-CM | POA: Diagnosis not present

## 2018-07-01 DIAGNOSIS — R296 Repeated falls: Secondary | ICD-10-CM | POA: Diagnosis not present

## 2018-07-01 DIAGNOSIS — R634 Abnormal weight loss: Secondary | ICD-10-CM | POA: Diagnosis not present

## 2018-07-01 DIAGNOSIS — I6523 Occlusion and stenosis of bilateral carotid arteries: Secondary | ICD-10-CM | POA: Diagnosis not present

## 2018-07-01 DIAGNOSIS — I69320 Aphasia following cerebral infarction: Secondary | ICD-10-CM | POA: Diagnosis not present

## 2018-07-01 DIAGNOSIS — R627 Adult failure to thrive: Secondary | ICD-10-CM | POA: Diagnosis not present

## 2018-07-01 DIAGNOSIS — G300 Alzheimer's disease with early onset: Secondary | ICD-10-CM | POA: Diagnosis not present

## 2018-07-02 DIAGNOSIS — Z515 Encounter for palliative care: Secondary | ICD-10-CM | POA: Diagnosis not present

## 2018-07-02 DIAGNOSIS — G300 Alzheimer's disease with early onset: Secondary | ICD-10-CM | POA: Diagnosis not present

## 2018-07-02 DIAGNOSIS — R451 Restlessness and agitation: Secondary | ICD-10-CM | POA: Diagnosis not present

## 2018-07-02 DIAGNOSIS — I6523 Occlusion and stenosis of bilateral carotid arteries: Secondary | ICD-10-CM | POA: Diagnosis not present

## 2018-07-02 DIAGNOSIS — I779 Disorder of arteries and arterioles, unspecified: Secondary | ICD-10-CM | POA: Diagnosis not present

## 2018-07-02 DIAGNOSIS — I10 Essential (primary) hypertension: Secondary | ICD-10-CM | POA: Diagnosis not present

## 2018-07-02 DIAGNOSIS — R296 Repeated falls: Secondary | ICD-10-CM | POA: Diagnosis not present

## 2018-07-02 DIAGNOSIS — F0280 Dementia in other diseases classified elsewhere without behavioral disturbance: Secondary | ICD-10-CM | POA: Diagnosis not present

## 2018-07-02 DIAGNOSIS — Z66 Do not resuscitate: Secondary | ICD-10-CM | POA: Diagnosis not present

## 2018-07-02 DIAGNOSIS — R410 Disorientation, unspecified: Secondary | ICD-10-CM | POA: Diagnosis not present

## 2018-07-02 DIAGNOSIS — R634 Abnormal weight loss: Secondary | ICD-10-CM | POA: Diagnosis not present

## 2018-07-03 DIAGNOSIS — Z66 Do not resuscitate: Secondary | ICD-10-CM | POA: Diagnosis not present

## 2018-07-03 DIAGNOSIS — I10 Essential (primary) hypertension: Secondary | ICD-10-CM | POA: Diagnosis not present

## 2018-07-03 DIAGNOSIS — G300 Alzheimer's disease with early onset: Secondary | ICD-10-CM | POA: Diagnosis not present

## 2018-07-03 DIAGNOSIS — R296 Repeated falls: Secondary | ICD-10-CM | POA: Diagnosis not present

## 2018-07-03 DIAGNOSIS — I6523 Occlusion and stenosis of bilateral carotid arteries: Secondary | ICD-10-CM | POA: Diagnosis not present

## 2018-07-03 DIAGNOSIS — F0280 Dementia in other diseases classified elsewhere without behavioral disturbance: Secondary | ICD-10-CM | POA: Diagnosis not present

## 2018-07-03 DIAGNOSIS — I779 Disorder of arteries and arterioles, unspecified: Secondary | ICD-10-CM | POA: Diagnosis not present

## 2018-07-03 DIAGNOSIS — R410 Disorientation, unspecified: Secondary | ICD-10-CM | POA: Diagnosis not present

## 2018-07-03 DIAGNOSIS — R634 Abnormal weight loss: Secondary | ICD-10-CM | POA: Diagnosis not present

## 2018-07-03 DIAGNOSIS — R451 Restlessness and agitation: Secondary | ICD-10-CM | POA: Diagnosis not present

## 2018-07-03 DIAGNOSIS — Z515 Encounter for palliative care: Secondary | ICD-10-CM | POA: Diagnosis not present

## 2018-07-04 DIAGNOSIS — R296 Repeated falls: Secondary | ICD-10-CM | POA: Diagnosis not present

## 2018-07-04 DIAGNOSIS — R451 Restlessness and agitation: Secondary | ICD-10-CM | POA: Diagnosis not present

## 2018-07-04 DIAGNOSIS — I779 Disorder of arteries and arterioles, unspecified: Secondary | ICD-10-CM | POA: Diagnosis not present

## 2018-07-04 DIAGNOSIS — R634 Abnormal weight loss: Secondary | ICD-10-CM | POA: Diagnosis not present

## 2018-07-04 DIAGNOSIS — I10 Essential (primary) hypertension: Secondary | ICD-10-CM | POA: Diagnosis not present

## 2018-07-04 DIAGNOSIS — G300 Alzheimer's disease with early onset: Secondary | ICD-10-CM | POA: Diagnosis not present

## 2018-07-04 DIAGNOSIS — F0280 Dementia in other diseases classified elsewhere without behavioral disturbance: Secondary | ICD-10-CM | POA: Diagnosis not present

## 2018-07-04 DIAGNOSIS — I6523 Occlusion and stenosis of bilateral carotid arteries: Secondary | ICD-10-CM | POA: Diagnosis not present

## 2018-07-04 DIAGNOSIS — R410 Disorientation, unspecified: Secondary | ICD-10-CM | POA: Diagnosis not present

## 2018-07-04 DIAGNOSIS — Z66 Do not resuscitate: Secondary | ICD-10-CM | POA: Diagnosis not present

## 2018-07-04 DIAGNOSIS — Z515 Encounter for palliative care: Secondary | ICD-10-CM | POA: Diagnosis not present

## 2018-07-05 DIAGNOSIS — Z515 Encounter for palliative care: Secondary | ICD-10-CM | POA: Diagnosis not present

## 2018-07-05 DIAGNOSIS — F0280 Dementia in other diseases classified elsewhere without behavioral disturbance: Secondary | ICD-10-CM | POA: Diagnosis not present

## 2018-07-05 DIAGNOSIS — R296 Repeated falls: Secondary | ICD-10-CM | POA: Diagnosis not present

## 2018-07-05 DIAGNOSIS — I779 Disorder of arteries and arterioles, unspecified: Secondary | ICD-10-CM | POA: Diagnosis not present

## 2018-07-05 DIAGNOSIS — G300 Alzheimer's disease with early onset: Secondary | ICD-10-CM | POA: Diagnosis not present

## 2018-07-05 DIAGNOSIS — R451 Restlessness and agitation: Secondary | ICD-10-CM | POA: Diagnosis not present

## 2018-07-05 DIAGNOSIS — R634 Abnormal weight loss: Secondary | ICD-10-CM | POA: Diagnosis not present

## 2018-07-05 DIAGNOSIS — R410 Disorientation, unspecified: Secondary | ICD-10-CM | POA: Diagnosis not present

## 2018-07-05 DIAGNOSIS — I6523 Occlusion and stenosis of bilateral carotid arteries: Secondary | ICD-10-CM | POA: Diagnosis not present

## 2018-07-05 DIAGNOSIS — Z66 Do not resuscitate: Secondary | ICD-10-CM | POA: Diagnosis not present

## 2018-07-05 DIAGNOSIS — I10 Essential (primary) hypertension: Secondary | ICD-10-CM | POA: Diagnosis not present

## 2018-07-06 DIAGNOSIS — I6523 Occlusion and stenosis of bilateral carotid arteries: Secondary | ICD-10-CM | POA: Diagnosis not present

## 2018-07-06 DIAGNOSIS — G300 Alzheimer's disease with early onset: Secondary | ICD-10-CM | POA: Diagnosis not present

## 2018-07-06 DIAGNOSIS — R634 Abnormal weight loss: Secondary | ICD-10-CM | POA: Diagnosis not present

## 2018-07-06 DIAGNOSIS — Z66 Do not resuscitate: Secondary | ICD-10-CM | POA: Diagnosis not present

## 2018-07-06 DIAGNOSIS — Z515 Encounter for palliative care: Secondary | ICD-10-CM | POA: Diagnosis not present

## 2018-07-06 DIAGNOSIS — R451 Restlessness and agitation: Secondary | ICD-10-CM | POA: Diagnosis not present

## 2018-07-06 DIAGNOSIS — R296 Repeated falls: Secondary | ICD-10-CM | POA: Diagnosis not present

## 2018-07-06 DIAGNOSIS — I779 Disorder of arteries and arterioles, unspecified: Secondary | ICD-10-CM | POA: Diagnosis not present

## 2018-07-06 DIAGNOSIS — I10 Essential (primary) hypertension: Secondary | ICD-10-CM | POA: Diagnosis not present

## 2018-07-06 DIAGNOSIS — F0280 Dementia in other diseases classified elsewhere without behavioral disturbance: Secondary | ICD-10-CM | POA: Diagnosis not present

## 2018-07-06 DIAGNOSIS — R410 Disorientation, unspecified: Secondary | ICD-10-CM | POA: Diagnosis not present

## 2018-07-07 DIAGNOSIS — Z515 Encounter for palliative care: Secondary | ICD-10-CM | POA: Diagnosis not present

## 2018-07-07 DIAGNOSIS — I779 Disorder of arteries and arterioles, unspecified: Secondary | ICD-10-CM | POA: Diagnosis not present

## 2018-07-07 DIAGNOSIS — R634 Abnormal weight loss: Secondary | ICD-10-CM | POA: Diagnosis not present

## 2018-07-07 DIAGNOSIS — R296 Repeated falls: Secondary | ICD-10-CM | POA: Diagnosis not present

## 2018-07-07 DIAGNOSIS — Z66 Do not resuscitate: Secondary | ICD-10-CM | POA: Diagnosis not present

## 2018-07-07 DIAGNOSIS — I10 Essential (primary) hypertension: Secondary | ICD-10-CM | POA: Diagnosis not present

## 2018-07-07 DIAGNOSIS — R410 Disorientation, unspecified: Secondary | ICD-10-CM | POA: Diagnosis not present

## 2018-07-07 DIAGNOSIS — F0280 Dementia in other diseases classified elsewhere without behavioral disturbance: Secondary | ICD-10-CM | POA: Diagnosis not present

## 2018-07-07 DIAGNOSIS — G300 Alzheimer's disease with early onset: Secondary | ICD-10-CM | POA: Diagnosis not present

## 2018-07-07 DIAGNOSIS — R451 Restlessness and agitation: Secondary | ICD-10-CM | POA: Diagnosis not present

## 2018-07-07 DIAGNOSIS — I6523 Occlusion and stenosis of bilateral carotid arteries: Secondary | ICD-10-CM | POA: Diagnosis not present

## 2018-07-08 DIAGNOSIS — R627 Adult failure to thrive: Secondary | ICD-10-CM | POA: Diagnosis not present

## 2018-07-08 DIAGNOSIS — R451 Restlessness and agitation: Secondary | ICD-10-CM | POA: Diagnosis not present

## 2018-07-08 DIAGNOSIS — I6523 Occlusion and stenosis of bilateral carotid arteries: Secondary | ICD-10-CM | POA: Diagnosis not present

## 2018-07-08 DIAGNOSIS — R52 Pain, unspecified: Secondary | ICD-10-CM | POA: Diagnosis not present

## 2018-07-08 DIAGNOSIS — R410 Disorientation, unspecified: Secondary | ICD-10-CM | POA: Diagnosis not present

## 2018-07-08 DIAGNOSIS — Z66 Do not resuscitate: Secondary | ICD-10-CM | POA: Diagnosis not present

## 2018-07-08 DIAGNOSIS — R634 Abnormal weight loss: Secondary | ICD-10-CM | POA: Diagnosis not present

## 2018-07-08 DIAGNOSIS — F0280 Dementia in other diseases classified elsewhere without behavioral disturbance: Secondary | ICD-10-CM | POA: Diagnosis not present

## 2018-07-08 DIAGNOSIS — I10 Essential (primary) hypertension: Secondary | ICD-10-CM | POA: Diagnosis not present

## 2018-07-08 DIAGNOSIS — Z515 Encounter for palliative care: Secondary | ICD-10-CM | POA: Diagnosis not present

## 2018-07-08 DIAGNOSIS — R296 Repeated falls: Secondary | ICD-10-CM | POA: Diagnosis not present

## 2018-07-08 DIAGNOSIS — R54 Age-related physical debility: Secondary | ICD-10-CM | POA: Diagnosis not present

## 2018-07-08 DIAGNOSIS — G300 Alzheimer's disease with early onset: Secondary | ICD-10-CM | POA: Diagnosis not present

## 2018-07-08 DIAGNOSIS — I779 Disorder of arteries and arterioles, unspecified: Secondary | ICD-10-CM | POA: Diagnosis not present

## 2018-07-09 DIAGNOSIS — R296 Repeated falls: Secondary | ICD-10-CM | POA: Diagnosis not present

## 2018-07-09 DIAGNOSIS — I6523 Occlusion and stenosis of bilateral carotid arteries: Secondary | ICD-10-CM | POA: Diagnosis not present

## 2018-07-09 DIAGNOSIS — F0280 Dementia in other diseases classified elsewhere without behavioral disturbance: Secondary | ICD-10-CM | POA: Diagnosis not present

## 2018-07-09 DIAGNOSIS — G300 Alzheimer's disease with early onset: Secondary | ICD-10-CM | POA: Diagnosis not present

## 2018-07-09 DIAGNOSIS — R451 Restlessness and agitation: Secondary | ICD-10-CM | POA: Diagnosis not present

## 2018-07-09 DIAGNOSIS — I779 Disorder of arteries and arterioles, unspecified: Secondary | ICD-10-CM | POA: Diagnosis not present

## 2018-07-09 DIAGNOSIS — R634 Abnormal weight loss: Secondary | ICD-10-CM | POA: Diagnosis not present

## 2018-07-09 DIAGNOSIS — F419 Anxiety disorder, unspecified: Secondary | ICD-10-CM | POA: Diagnosis not present

## 2018-07-09 DIAGNOSIS — Z66 Do not resuscitate: Secondary | ICD-10-CM | POA: Diagnosis not present

## 2018-07-09 DIAGNOSIS — R410 Disorientation, unspecified: Secondary | ICD-10-CM | POA: Diagnosis not present

## 2018-07-09 DIAGNOSIS — Z515 Encounter for palliative care: Secondary | ICD-10-CM | POA: Diagnosis not present

## 2018-07-09 DIAGNOSIS — F0390 Unspecified dementia without behavioral disturbance: Secondary | ICD-10-CM | POA: Diagnosis not present

## 2018-07-09 DIAGNOSIS — I10 Essential (primary) hypertension: Secondary | ICD-10-CM | POA: Diagnosis not present

## 2018-07-10 DIAGNOSIS — R634 Abnormal weight loss: Secondary | ICD-10-CM | POA: Diagnosis not present

## 2018-07-10 DIAGNOSIS — R410 Disorientation, unspecified: Secondary | ICD-10-CM | POA: Diagnosis not present

## 2018-07-10 DIAGNOSIS — Z515 Encounter for palliative care: Secondary | ICD-10-CM | POA: Diagnosis not present

## 2018-07-10 DIAGNOSIS — I10 Essential (primary) hypertension: Secondary | ICD-10-CM | POA: Diagnosis not present

## 2018-07-10 DIAGNOSIS — B351 Tinea unguium: Secondary | ICD-10-CM | POA: Diagnosis not present

## 2018-07-10 DIAGNOSIS — Z66 Do not resuscitate: Secondary | ICD-10-CM | POA: Diagnosis not present

## 2018-07-10 DIAGNOSIS — I739 Peripheral vascular disease, unspecified: Secondary | ICD-10-CM | POA: Diagnosis not present

## 2018-07-10 DIAGNOSIS — G300 Alzheimer's disease with early onset: Secondary | ICD-10-CM | POA: Diagnosis not present

## 2018-07-10 DIAGNOSIS — I779 Disorder of arteries and arterioles, unspecified: Secondary | ICD-10-CM | POA: Diagnosis not present

## 2018-07-10 DIAGNOSIS — I6523 Occlusion and stenosis of bilateral carotid arteries: Secondary | ICD-10-CM | POA: Diagnosis not present

## 2018-07-10 DIAGNOSIS — L603 Nail dystrophy: Secondary | ICD-10-CM | POA: Diagnosis not present

## 2018-07-10 DIAGNOSIS — R296 Repeated falls: Secondary | ICD-10-CM | POA: Diagnosis not present

## 2018-07-10 DIAGNOSIS — R451 Restlessness and agitation: Secondary | ICD-10-CM | POA: Diagnosis not present

## 2018-07-10 DIAGNOSIS — F0280 Dementia in other diseases classified elsewhere without behavioral disturbance: Secondary | ICD-10-CM | POA: Diagnosis not present

## 2018-07-10 DIAGNOSIS — Q845 Enlarged and hypertrophic nails: Secondary | ICD-10-CM | POA: Diagnosis not present

## 2018-07-11 DIAGNOSIS — R634 Abnormal weight loss: Secondary | ICD-10-CM | POA: Diagnosis not present

## 2018-07-11 DIAGNOSIS — Z66 Do not resuscitate: Secondary | ICD-10-CM | POA: Diagnosis not present

## 2018-07-11 DIAGNOSIS — I10 Essential (primary) hypertension: Secondary | ICD-10-CM | POA: Diagnosis not present

## 2018-07-11 DIAGNOSIS — R296 Repeated falls: Secondary | ICD-10-CM | POA: Diagnosis not present

## 2018-07-11 DIAGNOSIS — Z515 Encounter for palliative care: Secondary | ICD-10-CM | POA: Diagnosis not present

## 2018-07-11 DIAGNOSIS — I6523 Occlusion and stenosis of bilateral carotid arteries: Secondary | ICD-10-CM | POA: Diagnosis not present

## 2018-07-11 DIAGNOSIS — R410 Disorientation, unspecified: Secondary | ICD-10-CM | POA: Diagnosis not present

## 2018-07-11 DIAGNOSIS — R451 Restlessness and agitation: Secondary | ICD-10-CM | POA: Diagnosis not present

## 2018-07-11 DIAGNOSIS — F0280 Dementia in other diseases classified elsewhere without behavioral disturbance: Secondary | ICD-10-CM | POA: Diagnosis not present

## 2018-07-11 DIAGNOSIS — I779 Disorder of arteries and arterioles, unspecified: Secondary | ICD-10-CM | POA: Diagnosis not present

## 2018-07-11 DIAGNOSIS — G300 Alzheimer's disease with early onset: Secondary | ICD-10-CM | POA: Diagnosis not present

## 2018-07-12 DIAGNOSIS — R634 Abnormal weight loss: Secondary | ICD-10-CM | POA: Diagnosis not present

## 2018-07-12 DIAGNOSIS — I6523 Occlusion and stenosis of bilateral carotid arteries: Secondary | ICD-10-CM | POA: Diagnosis not present

## 2018-07-12 DIAGNOSIS — R451 Restlessness and agitation: Secondary | ICD-10-CM | POA: Diagnosis not present

## 2018-07-12 DIAGNOSIS — I10 Essential (primary) hypertension: Secondary | ICD-10-CM | POA: Diagnosis not present

## 2018-07-12 DIAGNOSIS — R410 Disorientation, unspecified: Secondary | ICD-10-CM | POA: Diagnosis not present

## 2018-07-12 DIAGNOSIS — R296 Repeated falls: Secondary | ICD-10-CM | POA: Diagnosis not present

## 2018-07-12 DIAGNOSIS — I779 Disorder of arteries and arterioles, unspecified: Secondary | ICD-10-CM | POA: Diagnosis not present

## 2018-07-12 DIAGNOSIS — G300 Alzheimer's disease with early onset: Secondary | ICD-10-CM | POA: Diagnosis not present

## 2018-07-12 DIAGNOSIS — Z66 Do not resuscitate: Secondary | ICD-10-CM | POA: Diagnosis not present

## 2018-07-12 DIAGNOSIS — Z515 Encounter for palliative care: Secondary | ICD-10-CM | POA: Diagnosis not present

## 2018-07-12 DIAGNOSIS — F0280 Dementia in other diseases classified elsewhere without behavioral disturbance: Secondary | ICD-10-CM | POA: Diagnosis not present

## 2018-07-13 DIAGNOSIS — Z515 Encounter for palliative care: Secondary | ICD-10-CM | POA: Diagnosis not present

## 2018-07-13 DIAGNOSIS — F0280 Dementia in other diseases classified elsewhere without behavioral disturbance: Secondary | ICD-10-CM | POA: Diagnosis not present

## 2018-07-13 DIAGNOSIS — R410 Disorientation, unspecified: Secondary | ICD-10-CM | POA: Diagnosis not present

## 2018-07-13 DIAGNOSIS — R634 Abnormal weight loss: Secondary | ICD-10-CM | POA: Diagnosis not present

## 2018-07-13 DIAGNOSIS — R451 Restlessness and agitation: Secondary | ICD-10-CM | POA: Diagnosis not present

## 2018-07-13 DIAGNOSIS — I779 Disorder of arteries and arterioles, unspecified: Secondary | ICD-10-CM | POA: Diagnosis not present

## 2018-07-13 DIAGNOSIS — R296 Repeated falls: Secondary | ICD-10-CM | POA: Diagnosis not present

## 2018-07-13 DIAGNOSIS — G300 Alzheimer's disease with early onset: Secondary | ICD-10-CM | POA: Diagnosis not present

## 2018-07-13 DIAGNOSIS — I6523 Occlusion and stenosis of bilateral carotid arteries: Secondary | ICD-10-CM | POA: Diagnosis not present

## 2018-07-13 DIAGNOSIS — Z66 Do not resuscitate: Secondary | ICD-10-CM | POA: Diagnosis not present

## 2018-07-13 DIAGNOSIS — I10 Essential (primary) hypertension: Secondary | ICD-10-CM | POA: Diagnosis not present

## 2018-07-14 DIAGNOSIS — R296 Repeated falls: Secondary | ICD-10-CM | POA: Diagnosis not present

## 2018-07-14 DIAGNOSIS — R410 Disorientation, unspecified: Secondary | ICD-10-CM | POA: Diagnosis not present

## 2018-07-14 DIAGNOSIS — I10 Essential (primary) hypertension: Secondary | ICD-10-CM | POA: Diagnosis not present

## 2018-07-14 DIAGNOSIS — Z66 Do not resuscitate: Secondary | ICD-10-CM | POA: Diagnosis not present

## 2018-07-14 DIAGNOSIS — Z515 Encounter for palliative care: Secondary | ICD-10-CM | POA: Diagnosis not present

## 2018-07-14 DIAGNOSIS — G300 Alzheimer's disease with early onset: Secondary | ICD-10-CM | POA: Diagnosis not present

## 2018-07-14 DIAGNOSIS — I6523 Occlusion and stenosis of bilateral carotid arteries: Secondary | ICD-10-CM | POA: Diagnosis not present

## 2018-07-14 DIAGNOSIS — R634 Abnormal weight loss: Secondary | ICD-10-CM | POA: Diagnosis not present

## 2018-07-14 DIAGNOSIS — I779 Disorder of arteries and arterioles, unspecified: Secondary | ICD-10-CM | POA: Diagnosis not present

## 2018-07-14 DIAGNOSIS — F0280 Dementia in other diseases classified elsewhere without behavioral disturbance: Secondary | ICD-10-CM | POA: Diagnosis not present

## 2018-07-14 DIAGNOSIS — R451 Restlessness and agitation: Secondary | ICD-10-CM | POA: Diagnosis not present

## 2018-07-15 DIAGNOSIS — R451 Restlessness and agitation: Secondary | ICD-10-CM | POA: Diagnosis not present

## 2018-07-15 DIAGNOSIS — R634 Abnormal weight loss: Secondary | ICD-10-CM | POA: Diagnosis not present

## 2018-07-15 DIAGNOSIS — Z515 Encounter for palliative care: Secondary | ICD-10-CM | POA: Diagnosis not present

## 2018-07-15 DIAGNOSIS — I6523 Occlusion and stenosis of bilateral carotid arteries: Secondary | ICD-10-CM | POA: Diagnosis not present

## 2018-07-15 DIAGNOSIS — G300 Alzheimer's disease with early onset: Secondary | ICD-10-CM | POA: Diagnosis not present

## 2018-07-15 DIAGNOSIS — I779 Disorder of arteries and arterioles, unspecified: Secondary | ICD-10-CM | POA: Diagnosis not present

## 2018-07-15 DIAGNOSIS — R410 Disorientation, unspecified: Secondary | ICD-10-CM | POA: Diagnosis not present

## 2018-07-15 DIAGNOSIS — F0280 Dementia in other diseases classified elsewhere without behavioral disturbance: Secondary | ICD-10-CM | POA: Diagnosis not present

## 2018-07-15 DIAGNOSIS — R296 Repeated falls: Secondary | ICD-10-CM | POA: Diagnosis not present

## 2018-07-15 DIAGNOSIS — I10 Essential (primary) hypertension: Secondary | ICD-10-CM | POA: Diagnosis not present

## 2018-07-15 DIAGNOSIS — Z66 Do not resuscitate: Secondary | ICD-10-CM | POA: Diagnosis not present

## 2018-07-16 DIAGNOSIS — R451 Restlessness and agitation: Secondary | ICD-10-CM | POA: Diagnosis not present

## 2018-07-16 DIAGNOSIS — I779 Disorder of arteries and arterioles, unspecified: Secondary | ICD-10-CM | POA: Diagnosis not present

## 2018-07-16 DIAGNOSIS — R296 Repeated falls: Secondary | ICD-10-CM | POA: Diagnosis not present

## 2018-07-16 DIAGNOSIS — F0280 Dementia in other diseases classified elsewhere without behavioral disturbance: Secondary | ICD-10-CM | POA: Diagnosis not present

## 2018-07-16 DIAGNOSIS — Z515 Encounter for palliative care: Secondary | ICD-10-CM | POA: Diagnosis not present

## 2018-07-16 DIAGNOSIS — R410 Disorientation, unspecified: Secondary | ICD-10-CM | POA: Diagnosis not present

## 2018-07-16 DIAGNOSIS — Z66 Do not resuscitate: Secondary | ICD-10-CM | POA: Diagnosis not present

## 2018-07-16 DIAGNOSIS — I6523 Occlusion and stenosis of bilateral carotid arteries: Secondary | ICD-10-CM | POA: Diagnosis not present

## 2018-07-16 DIAGNOSIS — I10 Essential (primary) hypertension: Secondary | ICD-10-CM | POA: Diagnosis not present

## 2018-07-16 DIAGNOSIS — G300 Alzheimer's disease with early onset: Secondary | ICD-10-CM | POA: Diagnosis not present

## 2018-07-16 DIAGNOSIS — R634 Abnormal weight loss: Secondary | ICD-10-CM | POA: Diagnosis not present

## 2018-07-17 DIAGNOSIS — R451 Restlessness and agitation: Secondary | ICD-10-CM | POA: Diagnosis not present

## 2018-07-17 DIAGNOSIS — R296 Repeated falls: Secondary | ICD-10-CM | POA: Diagnosis not present

## 2018-07-17 DIAGNOSIS — I10 Essential (primary) hypertension: Secondary | ICD-10-CM | POA: Diagnosis not present

## 2018-07-17 DIAGNOSIS — R634 Abnormal weight loss: Secondary | ICD-10-CM | POA: Diagnosis not present

## 2018-07-17 DIAGNOSIS — R410 Disorientation, unspecified: Secondary | ICD-10-CM | POA: Diagnosis not present

## 2018-07-17 DIAGNOSIS — I779 Disorder of arteries and arterioles, unspecified: Secondary | ICD-10-CM | POA: Diagnosis not present

## 2018-07-17 DIAGNOSIS — G300 Alzheimer's disease with early onset: Secondary | ICD-10-CM | POA: Diagnosis not present

## 2018-07-17 DIAGNOSIS — Z515 Encounter for palliative care: Secondary | ICD-10-CM | POA: Diagnosis not present

## 2018-07-17 DIAGNOSIS — I6523 Occlusion and stenosis of bilateral carotid arteries: Secondary | ICD-10-CM | POA: Diagnosis not present

## 2018-07-17 DIAGNOSIS — F0280 Dementia in other diseases classified elsewhere without behavioral disturbance: Secondary | ICD-10-CM | POA: Diagnosis not present

## 2018-07-17 DIAGNOSIS — Z66 Do not resuscitate: Secondary | ICD-10-CM | POA: Diagnosis not present

## 2018-07-18 DIAGNOSIS — F0280 Dementia in other diseases classified elsewhere without behavioral disturbance: Secondary | ICD-10-CM | POA: Diagnosis not present

## 2018-07-18 DIAGNOSIS — R296 Repeated falls: Secondary | ICD-10-CM | POA: Diagnosis not present

## 2018-07-18 DIAGNOSIS — Z515 Encounter for palliative care: Secondary | ICD-10-CM | POA: Diagnosis not present

## 2018-07-18 DIAGNOSIS — R634 Abnormal weight loss: Secondary | ICD-10-CM | POA: Diagnosis not present

## 2018-07-18 DIAGNOSIS — I10 Essential (primary) hypertension: Secondary | ICD-10-CM | POA: Diagnosis not present

## 2018-07-18 DIAGNOSIS — G300 Alzheimer's disease with early onset: Secondary | ICD-10-CM | POA: Diagnosis not present

## 2018-07-18 DIAGNOSIS — I6523 Occlusion and stenosis of bilateral carotid arteries: Secondary | ICD-10-CM | POA: Diagnosis not present

## 2018-07-18 DIAGNOSIS — I779 Disorder of arteries and arterioles, unspecified: Secondary | ICD-10-CM | POA: Diagnosis not present

## 2018-07-18 DIAGNOSIS — R410 Disorientation, unspecified: Secondary | ICD-10-CM | POA: Diagnosis not present

## 2018-07-18 DIAGNOSIS — Z66 Do not resuscitate: Secondary | ICD-10-CM | POA: Diagnosis not present

## 2018-07-18 DIAGNOSIS — R451 Restlessness and agitation: Secondary | ICD-10-CM | POA: Diagnosis not present

## 2018-07-19 DIAGNOSIS — R410 Disorientation, unspecified: Secondary | ICD-10-CM | POA: Diagnosis not present

## 2018-07-19 DIAGNOSIS — Z66 Do not resuscitate: Secondary | ICD-10-CM | POA: Diagnosis not present

## 2018-07-19 DIAGNOSIS — G300 Alzheimer's disease with early onset: Secondary | ICD-10-CM | POA: Diagnosis not present

## 2018-07-19 DIAGNOSIS — R634 Abnormal weight loss: Secondary | ICD-10-CM | POA: Diagnosis not present

## 2018-07-19 DIAGNOSIS — R296 Repeated falls: Secondary | ICD-10-CM | POA: Diagnosis not present

## 2018-07-19 DIAGNOSIS — Z515 Encounter for palliative care: Secondary | ICD-10-CM | POA: Diagnosis not present

## 2018-07-19 DIAGNOSIS — R451 Restlessness and agitation: Secondary | ICD-10-CM | POA: Diagnosis not present

## 2018-07-19 DIAGNOSIS — I779 Disorder of arteries and arterioles, unspecified: Secondary | ICD-10-CM | POA: Diagnosis not present

## 2018-07-19 DIAGNOSIS — I10 Essential (primary) hypertension: Secondary | ICD-10-CM | POA: Diagnosis not present

## 2018-07-19 DIAGNOSIS — F0280 Dementia in other diseases classified elsewhere without behavioral disturbance: Secondary | ICD-10-CM | POA: Diagnosis not present

## 2018-07-19 DIAGNOSIS — I6523 Occlusion and stenosis of bilateral carotid arteries: Secondary | ICD-10-CM | POA: Diagnosis not present

## 2018-07-20 DIAGNOSIS — Z515 Encounter for palliative care: Secondary | ICD-10-CM | POA: Diagnosis not present

## 2018-07-20 DIAGNOSIS — R451 Restlessness and agitation: Secondary | ICD-10-CM | POA: Diagnosis not present

## 2018-07-20 DIAGNOSIS — I6523 Occlusion and stenosis of bilateral carotid arteries: Secondary | ICD-10-CM | POA: Diagnosis not present

## 2018-07-20 DIAGNOSIS — R634 Abnormal weight loss: Secondary | ICD-10-CM | POA: Diagnosis not present

## 2018-07-20 DIAGNOSIS — I779 Disorder of arteries and arterioles, unspecified: Secondary | ICD-10-CM | POA: Diagnosis not present

## 2018-07-20 DIAGNOSIS — F0280 Dementia in other diseases classified elsewhere without behavioral disturbance: Secondary | ICD-10-CM | POA: Diagnosis not present

## 2018-07-20 DIAGNOSIS — R410 Disorientation, unspecified: Secondary | ICD-10-CM | POA: Diagnosis not present

## 2018-07-20 DIAGNOSIS — G300 Alzheimer's disease with early onset: Secondary | ICD-10-CM | POA: Diagnosis not present

## 2018-07-20 DIAGNOSIS — Z66 Do not resuscitate: Secondary | ICD-10-CM | POA: Diagnosis not present

## 2018-07-20 DIAGNOSIS — R296 Repeated falls: Secondary | ICD-10-CM | POA: Diagnosis not present

## 2018-07-20 DIAGNOSIS — I10 Essential (primary) hypertension: Secondary | ICD-10-CM | POA: Diagnosis not present

## 2018-07-21 DIAGNOSIS — F0280 Dementia in other diseases classified elsewhere without behavioral disturbance: Secondary | ICD-10-CM | POA: Diagnosis not present

## 2018-07-21 DIAGNOSIS — Z66 Do not resuscitate: Secondary | ICD-10-CM | POA: Diagnosis not present

## 2018-07-21 DIAGNOSIS — I10 Essential (primary) hypertension: Secondary | ICD-10-CM | POA: Diagnosis not present

## 2018-07-21 DIAGNOSIS — I779 Disorder of arteries and arterioles, unspecified: Secondary | ICD-10-CM | POA: Diagnosis not present

## 2018-07-21 DIAGNOSIS — Z515 Encounter for palliative care: Secondary | ICD-10-CM | POA: Diagnosis not present

## 2018-07-21 DIAGNOSIS — R410 Disorientation, unspecified: Secondary | ICD-10-CM | POA: Diagnosis not present

## 2018-07-21 DIAGNOSIS — R451 Restlessness and agitation: Secondary | ICD-10-CM | POA: Diagnosis not present

## 2018-07-21 DIAGNOSIS — G300 Alzheimer's disease with early onset: Secondary | ICD-10-CM | POA: Diagnosis not present

## 2018-07-21 DIAGNOSIS — R634 Abnormal weight loss: Secondary | ICD-10-CM | POA: Diagnosis not present

## 2018-07-21 DIAGNOSIS — I6523 Occlusion and stenosis of bilateral carotid arteries: Secondary | ICD-10-CM | POA: Diagnosis not present

## 2018-07-21 DIAGNOSIS — R296 Repeated falls: Secondary | ICD-10-CM | POA: Diagnosis not present

## 2018-07-22 DIAGNOSIS — R451 Restlessness and agitation: Secondary | ICD-10-CM | POA: Diagnosis not present

## 2018-07-22 DIAGNOSIS — R634 Abnormal weight loss: Secondary | ICD-10-CM | POA: Diagnosis not present

## 2018-07-22 DIAGNOSIS — I10 Essential (primary) hypertension: Secondary | ICD-10-CM | POA: Diagnosis not present

## 2018-07-22 DIAGNOSIS — G300 Alzheimer's disease with early onset: Secondary | ICD-10-CM | POA: Diagnosis not present

## 2018-07-22 DIAGNOSIS — Z66 Do not resuscitate: Secondary | ICD-10-CM | POA: Diagnosis not present

## 2018-07-22 DIAGNOSIS — Z515 Encounter for palliative care: Secondary | ICD-10-CM | POA: Diagnosis not present

## 2018-07-22 DIAGNOSIS — F0280 Dementia in other diseases classified elsewhere without behavioral disturbance: Secondary | ICD-10-CM | POA: Diagnosis not present

## 2018-07-22 DIAGNOSIS — R296 Repeated falls: Secondary | ICD-10-CM | POA: Diagnosis not present

## 2018-07-22 DIAGNOSIS — R410 Disorientation, unspecified: Secondary | ICD-10-CM | POA: Diagnosis not present

## 2018-07-22 DIAGNOSIS — I6523 Occlusion and stenosis of bilateral carotid arteries: Secondary | ICD-10-CM | POA: Diagnosis not present

## 2018-07-22 DIAGNOSIS — I779 Disorder of arteries and arterioles, unspecified: Secondary | ICD-10-CM | POA: Diagnosis not present

## 2018-07-23 DIAGNOSIS — I6523 Occlusion and stenosis of bilateral carotid arteries: Secondary | ICD-10-CM | POA: Diagnosis not present

## 2018-07-23 DIAGNOSIS — R451 Restlessness and agitation: Secondary | ICD-10-CM | POA: Diagnosis not present

## 2018-07-23 DIAGNOSIS — Z515 Encounter for palliative care: Secondary | ICD-10-CM | POA: Diagnosis not present

## 2018-07-23 DIAGNOSIS — R296 Repeated falls: Secondary | ICD-10-CM | POA: Diagnosis not present

## 2018-07-23 DIAGNOSIS — Z66 Do not resuscitate: Secondary | ICD-10-CM | POA: Diagnosis not present

## 2018-07-23 DIAGNOSIS — R410 Disorientation, unspecified: Secondary | ICD-10-CM | POA: Diagnosis not present

## 2018-07-23 DIAGNOSIS — I779 Disorder of arteries and arterioles, unspecified: Secondary | ICD-10-CM | POA: Diagnosis not present

## 2018-07-23 DIAGNOSIS — I10 Essential (primary) hypertension: Secondary | ICD-10-CM | POA: Diagnosis not present

## 2018-07-23 DIAGNOSIS — R634 Abnormal weight loss: Secondary | ICD-10-CM | POA: Diagnosis not present

## 2018-07-23 DIAGNOSIS — F0280 Dementia in other diseases classified elsewhere without behavioral disturbance: Secondary | ICD-10-CM | POA: Diagnosis not present

## 2018-07-23 DIAGNOSIS — G300 Alzheimer's disease with early onset: Secondary | ICD-10-CM | POA: Diagnosis not present

## 2018-07-24 DIAGNOSIS — F0280 Dementia in other diseases classified elsewhere without behavioral disturbance: Secondary | ICD-10-CM | POA: Diagnosis not present

## 2018-07-24 DIAGNOSIS — I6523 Occlusion and stenosis of bilateral carotid arteries: Secondary | ICD-10-CM | POA: Diagnosis not present

## 2018-07-24 DIAGNOSIS — R410 Disorientation, unspecified: Secondary | ICD-10-CM | POA: Diagnosis not present

## 2018-07-24 DIAGNOSIS — G300 Alzheimer's disease with early onset: Secondary | ICD-10-CM | POA: Diagnosis not present

## 2018-07-24 DIAGNOSIS — I779 Disorder of arteries and arterioles, unspecified: Secondary | ICD-10-CM | POA: Diagnosis not present

## 2018-07-24 DIAGNOSIS — Z66 Do not resuscitate: Secondary | ICD-10-CM | POA: Diagnosis not present

## 2018-07-24 DIAGNOSIS — R451 Restlessness and agitation: Secondary | ICD-10-CM | POA: Diagnosis not present

## 2018-07-24 DIAGNOSIS — R634 Abnormal weight loss: Secondary | ICD-10-CM | POA: Diagnosis not present

## 2018-07-24 DIAGNOSIS — R296 Repeated falls: Secondary | ICD-10-CM | POA: Diagnosis not present

## 2018-07-24 DIAGNOSIS — I10 Essential (primary) hypertension: Secondary | ICD-10-CM | POA: Diagnosis not present

## 2018-07-24 DIAGNOSIS — Z515 Encounter for palliative care: Secondary | ICD-10-CM | POA: Diagnosis not present

## 2018-07-25 DIAGNOSIS — I6523 Occlusion and stenosis of bilateral carotid arteries: Secondary | ICD-10-CM | POA: Diagnosis not present

## 2018-07-25 DIAGNOSIS — R451 Restlessness and agitation: Secondary | ICD-10-CM | POA: Diagnosis not present

## 2018-07-25 DIAGNOSIS — Z66 Do not resuscitate: Secondary | ICD-10-CM | POA: Diagnosis not present

## 2018-07-25 DIAGNOSIS — R296 Repeated falls: Secondary | ICD-10-CM | POA: Diagnosis not present

## 2018-07-25 DIAGNOSIS — G300 Alzheimer's disease with early onset: Secondary | ICD-10-CM | POA: Diagnosis not present

## 2018-07-25 DIAGNOSIS — R634 Abnormal weight loss: Secondary | ICD-10-CM | POA: Diagnosis not present

## 2018-07-25 DIAGNOSIS — Z515 Encounter for palliative care: Secondary | ICD-10-CM | POA: Diagnosis not present

## 2018-07-25 DIAGNOSIS — I779 Disorder of arteries and arterioles, unspecified: Secondary | ICD-10-CM | POA: Diagnosis not present

## 2018-07-25 DIAGNOSIS — I10 Essential (primary) hypertension: Secondary | ICD-10-CM | POA: Diagnosis not present

## 2018-07-25 DIAGNOSIS — F0280 Dementia in other diseases classified elsewhere without behavioral disturbance: Secondary | ICD-10-CM | POA: Diagnosis not present

## 2018-07-25 DIAGNOSIS — R410 Disorientation, unspecified: Secondary | ICD-10-CM | POA: Diagnosis not present

## 2018-07-26 DIAGNOSIS — R634 Abnormal weight loss: Secondary | ICD-10-CM | POA: Diagnosis not present

## 2018-07-26 DIAGNOSIS — Z66 Do not resuscitate: Secondary | ICD-10-CM | POA: Diagnosis not present

## 2018-07-26 DIAGNOSIS — R451 Restlessness and agitation: Secondary | ICD-10-CM | POA: Diagnosis not present

## 2018-07-26 DIAGNOSIS — I10 Essential (primary) hypertension: Secondary | ICD-10-CM | POA: Diagnosis not present

## 2018-07-26 DIAGNOSIS — F0280 Dementia in other diseases classified elsewhere without behavioral disturbance: Secondary | ICD-10-CM | POA: Diagnosis not present

## 2018-07-26 DIAGNOSIS — I779 Disorder of arteries and arterioles, unspecified: Secondary | ICD-10-CM | POA: Diagnosis not present

## 2018-07-26 DIAGNOSIS — G300 Alzheimer's disease with early onset: Secondary | ICD-10-CM | POA: Diagnosis not present

## 2018-07-26 DIAGNOSIS — R410 Disorientation, unspecified: Secondary | ICD-10-CM | POA: Diagnosis not present

## 2018-07-26 DIAGNOSIS — Z515 Encounter for palliative care: Secondary | ICD-10-CM | POA: Diagnosis not present

## 2018-07-26 DIAGNOSIS — R296 Repeated falls: Secondary | ICD-10-CM | POA: Diagnosis not present

## 2018-07-26 DIAGNOSIS — I6523 Occlusion and stenosis of bilateral carotid arteries: Secondary | ICD-10-CM | POA: Diagnosis not present

## 2018-07-27 DIAGNOSIS — R451 Restlessness and agitation: Secondary | ICD-10-CM | POA: Diagnosis not present

## 2018-07-27 DIAGNOSIS — R634 Abnormal weight loss: Secondary | ICD-10-CM | POA: Diagnosis not present

## 2018-07-27 DIAGNOSIS — I10 Essential (primary) hypertension: Secondary | ICD-10-CM | POA: Diagnosis not present

## 2018-07-27 DIAGNOSIS — R296 Repeated falls: Secondary | ICD-10-CM | POA: Diagnosis not present

## 2018-07-27 DIAGNOSIS — I779 Disorder of arteries and arterioles, unspecified: Secondary | ICD-10-CM | POA: Diagnosis not present

## 2018-07-27 DIAGNOSIS — F0280 Dementia in other diseases classified elsewhere without behavioral disturbance: Secondary | ICD-10-CM | POA: Diagnosis not present

## 2018-07-27 DIAGNOSIS — G300 Alzheimer's disease with early onset: Secondary | ICD-10-CM | POA: Diagnosis not present

## 2018-07-27 DIAGNOSIS — I6523 Occlusion and stenosis of bilateral carotid arteries: Secondary | ICD-10-CM | POA: Diagnosis not present

## 2018-07-27 DIAGNOSIS — R410 Disorientation, unspecified: Secondary | ICD-10-CM | POA: Diagnosis not present

## 2018-07-27 DIAGNOSIS — Z515 Encounter for palliative care: Secondary | ICD-10-CM | POA: Diagnosis not present

## 2018-07-27 DIAGNOSIS — Z66 Do not resuscitate: Secondary | ICD-10-CM | POA: Diagnosis not present

## 2018-07-28 DIAGNOSIS — Z515 Encounter for palliative care: Secondary | ICD-10-CM | POA: Diagnosis not present

## 2018-07-28 DIAGNOSIS — I10 Essential (primary) hypertension: Secondary | ICD-10-CM | POA: Diagnosis not present

## 2018-07-28 DIAGNOSIS — R634 Abnormal weight loss: Secondary | ICD-10-CM | POA: Diagnosis not present

## 2018-07-28 DIAGNOSIS — R296 Repeated falls: Secondary | ICD-10-CM | POA: Diagnosis not present

## 2018-07-28 DIAGNOSIS — R410 Disorientation, unspecified: Secondary | ICD-10-CM | POA: Diagnosis not present

## 2018-07-28 DIAGNOSIS — F0280 Dementia in other diseases classified elsewhere without behavioral disturbance: Secondary | ICD-10-CM | POA: Diagnosis not present

## 2018-07-28 DIAGNOSIS — R451 Restlessness and agitation: Secondary | ICD-10-CM | POA: Diagnosis not present

## 2018-07-28 DIAGNOSIS — I6523 Occlusion and stenosis of bilateral carotid arteries: Secondary | ICD-10-CM | POA: Diagnosis not present

## 2018-07-28 DIAGNOSIS — I779 Disorder of arteries and arterioles, unspecified: Secondary | ICD-10-CM | POA: Diagnosis not present

## 2018-07-28 DIAGNOSIS — Z66 Do not resuscitate: Secondary | ICD-10-CM | POA: Diagnosis not present

## 2018-07-28 DIAGNOSIS — G300 Alzheimer's disease with early onset: Secondary | ICD-10-CM | POA: Diagnosis not present

## 2018-07-29 DIAGNOSIS — R451 Restlessness and agitation: Secondary | ICD-10-CM | POA: Diagnosis not present

## 2018-07-29 DIAGNOSIS — F0280 Dementia in other diseases classified elsewhere without behavioral disturbance: Secondary | ICD-10-CM | POA: Diagnosis not present

## 2018-07-29 DIAGNOSIS — R410 Disorientation, unspecified: Secondary | ICD-10-CM | POA: Diagnosis not present

## 2018-07-29 DIAGNOSIS — R296 Repeated falls: Secondary | ICD-10-CM | POA: Diagnosis not present

## 2018-07-29 DIAGNOSIS — Z515 Encounter for palliative care: Secondary | ICD-10-CM | POA: Diagnosis not present

## 2018-07-29 DIAGNOSIS — I6523 Occlusion and stenosis of bilateral carotid arteries: Secondary | ICD-10-CM | POA: Diagnosis not present

## 2018-07-29 DIAGNOSIS — I10 Essential (primary) hypertension: Secondary | ICD-10-CM | POA: Diagnosis not present

## 2018-07-29 DIAGNOSIS — G300 Alzheimer's disease with early onset: Secondary | ICD-10-CM | POA: Diagnosis not present

## 2018-07-29 DIAGNOSIS — I779 Disorder of arteries and arterioles, unspecified: Secondary | ICD-10-CM | POA: Diagnosis not present

## 2018-07-29 DIAGNOSIS — Z66 Do not resuscitate: Secondary | ICD-10-CM | POA: Diagnosis not present

## 2018-07-29 DIAGNOSIS — R634 Abnormal weight loss: Secondary | ICD-10-CM | POA: Diagnosis not present

## 2018-07-30 DIAGNOSIS — F0280 Dementia in other diseases classified elsewhere without behavioral disturbance: Secondary | ICD-10-CM | POA: Diagnosis not present

## 2018-07-30 DIAGNOSIS — I6523 Occlusion and stenosis of bilateral carotid arteries: Secondary | ICD-10-CM | POA: Diagnosis not present

## 2018-07-30 DIAGNOSIS — R451 Restlessness and agitation: Secondary | ICD-10-CM | POA: Diagnosis not present

## 2018-07-30 DIAGNOSIS — R410 Disorientation, unspecified: Secondary | ICD-10-CM | POA: Diagnosis not present

## 2018-07-30 DIAGNOSIS — Z515 Encounter for palliative care: Secondary | ICD-10-CM | POA: Diagnosis not present

## 2018-07-30 DIAGNOSIS — G300 Alzheimer's disease with early onset: Secondary | ICD-10-CM | POA: Diagnosis not present

## 2018-07-30 DIAGNOSIS — I779 Disorder of arteries and arterioles, unspecified: Secondary | ICD-10-CM | POA: Diagnosis not present

## 2018-07-30 DIAGNOSIS — Z66 Do not resuscitate: Secondary | ICD-10-CM | POA: Diagnosis not present

## 2018-07-30 DIAGNOSIS — R634 Abnormal weight loss: Secondary | ICD-10-CM | POA: Diagnosis not present

## 2018-07-30 DIAGNOSIS — R296 Repeated falls: Secondary | ICD-10-CM | POA: Diagnosis not present

## 2018-07-30 DIAGNOSIS — I10 Essential (primary) hypertension: Secondary | ICD-10-CM | POA: Diagnosis not present

## 2018-07-31 DIAGNOSIS — R451 Restlessness and agitation: Secondary | ICD-10-CM | POA: Diagnosis not present

## 2018-07-31 DIAGNOSIS — R410 Disorientation, unspecified: Secondary | ICD-10-CM | POA: Diagnosis not present

## 2018-07-31 DIAGNOSIS — I10 Essential (primary) hypertension: Secondary | ICD-10-CM | POA: Diagnosis not present

## 2018-07-31 DIAGNOSIS — R634 Abnormal weight loss: Secondary | ICD-10-CM | POA: Diagnosis not present

## 2018-07-31 DIAGNOSIS — Z515 Encounter for palliative care: Secondary | ICD-10-CM | POA: Diagnosis not present

## 2018-07-31 DIAGNOSIS — R296 Repeated falls: Secondary | ICD-10-CM | POA: Diagnosis not present

## 2018-07-31 DIAGNOSIS — I779 Disorder of arteries and arterioles, unspecified: Secondary | ICD-10-CM | POA: Diagnosis not present

## 2018-07-31 DIAGNOSIS — G300 Alzheimer's disease with early onset: Secondary | ICD-10-CM | POA: Diagnosis not present

## 2018-07-31 DIAGNOSIS — Z66 Do not resuscitate: Secondary | ICD-10-CM | POA: Diagnosis not present

## 2018-07-31 DIAGNOSIS — F0280 Dementia in other diseases classified elsewhere without behavioral disturbance: Secondary | ICD-10-CM | POA: Diagnosis not present

## 2018-07-31 DIAGNOSIS — I6523 Occlusion and stenosis of bilateral carotid arteries: Secondary | ICD-10-CM | POA: Diagnosis not present

## 2018-08-01 DIAGNOSIS — R410 Disorientation, unspecified: Secondary | ICD-10-CM | POA: Diagnosis not present

## 2018-08-01 DIAGNOSIS — R296 Repeated falls: Secondary | ICD-10-CM | POA: Diagnosis not present

## 2018-08-01 DIAGNOSIS — Z66 Do not resuscitate: Secondary | ICD-10-CM | POA: Diagnosis not present

## 2018-08-01 DIAGNOSIS — R634 Abnormal weight loss: Secondary | ICD-10-CM | POA: Diagnosis not present

## 2018-08-01 DIAGNOSIS — R451 Restlessness and agitation: Secondary | ICD-10-CM | POA: Diagnosis not present

## 2018-08-01 DIAGNOSIS — F0280 Dementia in other diseases classified elsewhere without behavioral disturbance: Secondary | ICD-10-CM | POA: Diagnosis not present

## 2018-08-01 DIAGNOSIS — Z515 Encounter for palliative care: Secondary | ICD-10-CM | POA: Diagnosis not present

## 2018-08-01 DIAGNOSIS — I779 Disorder of arteries and arterioles, unspecified: Secondary | ICD-10-CM | POA: Diagnosis not present

## 2018-08-01 DIAGNOSIS — I10 Essential (primary) hypertension: Secondary | ICD-10-CM | POA: Diagnosis not present

## 2018-08-01 DIAGNOSIS — G300 Alzheimer's disease with early onset: Secondary | ICD-10-CM | POA: Diagnosis not present

## 2018-08-01 DIAGNOSIS — I6523 Occlusion and stenosis of bilateral carotid arteries: Secondary | ICD-10-CM | POA: Diagnosis not present

## 2018-08-02 DIAGNOSIS — G300 Alzheimer's disease with early onset: Secondary | ICD-10-CM | POA: Diagnosis not present

## 2018-08-02 DIAGNOSIS — R296 Repeated falls: Secondary | ICD-10-CM | POA: Diagnosis not present

## 2018-08-02 DIAGNOSIS — F0280 Dementia in other diseases classified elsewhere without behavioral disturbance: Secondary | ICD-10-CM | POA: Diagnosis not present

## 2018-08-02 DIAGNOSIS — Z66 Do not resuscitate: Secondary | ICD-10-CM | POA: Diagnosis not present

## 2018-08-02 DIAGNOSIS — Z515 Encounter for palliative care: Secondary | ICD-10-CM | POA: Diagnosis not present

## 2018-08-02 DIAGNOSIS — R634 Abnormal weight loss: Secondary | ICD-10-CM | POA: Diagnosis not present

## 2018-08-02 DIAGNOSIS — I779 Disorder of arteries and arterioles, unspecified: Secondary | ICD-10-CM | POA: Diagnosis not present

## 2018-08-02 DIAGNOSIS — I6523 Occlusion and stenosis of bilateral carotid arteries: Secondary | ICD-10-CM | POA: Diagnosis not present

## 2018-08-02 DIAGNOSIS — R451 Restlessness and agitation: Secondary | ICD-10-CM | POA: Diagnosis not present

## 2018-08-02 DIAGNOSIS — R410 Disorientation, unspecified: Secondary | ICD-10-CM | POA: Diagnosis not present

## 2018-08-02 DIAGNOSIS — I10 Essential (primary) hypertension: Secondary | ICD-10-CM | POA: Diagnosis not present

## 2018-08-03 DIAGNOSIS — G300 Alzheimer's disease with early onset: Secondary | ICD-10-CM | POA: Diagnosis not present

## 2018-08-03 DIAGNOSIS — Z66 Do not resuscitate: Secondary | ICD-10-CM | POA: Diagnosis not present

## 2018-08-03 DIAGNOSIS — R634 Abnormal weight loss: Secondary | ICD-10-CM | POA: Diagnosis not present

## 2018-08-03 DIAGNOSIS — R410 Disorientation, unspecified: Secondary | ICD-10-CM | POA: Diagnosis not present

## 2018-08-03 DIAGNOSIS — Z515 Encounter for palliative care: Secondary | ICD-10-CM | POA: Diagnosis not present

## 2018-08-03 DIAGNOSIS — R296 Repeated falls: Secondary | ICD-10-CM | POA: Diagnosis not present

## 2018-08-03 DIAGNOSIS — I6523 Occlusion and stenosis of bilateral carotid arteries: Secondary | ICD-10-CM | POA: Diagnosis not present

## 2018-08-03 DIAGNOSIS — I10 Essential (primary) hypertension: Secondary | ICD-10-CM | POA: Diagnosis not present

## 2018-08-03 DIAGNOSIS — R451 Restlessness and agitation: Secondary | ICD-10-CM | POA: Diagnosis not present

## 2018-08-03 DIAGNOSIS — I779 Disorder of arteries and arterioles, unspecified: Secondary | ICD-10-CM | POA: Diagnosis not present

## 2018-08-03 DIAGNOSIS — F0280 Dementia in other diseases classified elsewhere without behavioral disturbance: Secondary | ICD-10-CM | POA: Diagnosis not present

## 2018-08-04 DIAGNOSIS — I10 Essential (primary) hypertension: Secondary | ICD-10-CM | POA: Diagnosis not present

## 2018-08-04 DIAGNOSIS — I6523 Occlusion and stenosis of bilateral carotid arteries: Secondary | ICD-10-CM | POA: Diagnosis not present

## 2018-08-04 DIAGNOSIS — F0280 Dementia in other diseases classified elsewhere without behavioral disturbance: Secondary | ICD-10-CM | POA: Diagnosis not present

## 2018-08-04 DIAGNOSIS — R634 Abnormal weight loss: Secondary | ICD-10-CM | POA: Diagnosis not present

## 2018-08-04 DIAGNOSIS — Z66 Do not resuscitate: Secondary | ICD-10-CM | POA: Diagnosis not present

## 2018-08-04 DIAGNOSIS — R296 Repeated falls: Secondary | ICD-10-CM | POA: Diagnosis not present

## 2018-08-04 DIAGNOSIS — G300 Alzheimer's disease with early onset: Secondary | ICD-10-CM | POA: Diagnosis not present

## 2018-08-04 DIAGNOSIS — I779 Disorder of arteries and arterioles, unspecified: Secondary | ICD-10-CM | POA: Diagnosis not present

## 2018-08-04 DIAGNOSIS — R451 Restlessness and agitation: Secondary | ICD-10-CM | POA: Diagnosis not present

## 2018-08-04 DIAGNOSIS — Z515 Encounter for palliative care: Secondary | ICD-10-CM | POA: Diagnosis not present

## 2018-08-04 DIAGNOSIS — R410 Disorientation, unspecified: Secondary | ICD-10-CM | POA: Diagnosis not present

## 2018-08-05 DIAGNOSIS — R410 Disorientation, unspecified: Secondary | ICD-10-CM | POA: Diagnosis not present

## 2018-08-05 DIAGNOSIS — I6523 Occlusion and stenosis of bilateral carotid arteries: Secondary | ICD-10-CM | POA: Diagnosis not present

## 2018-08-05 DIAGNOSIS — R296 Repeated falls: Secondary | ICD-10-CM | POA: Diagnosis not present

## 2018-08-05 DIAGNOSIS — Z66 Do not resuscitate: Secondary | ICD-10-CM | POA: Diagnosis not present

## 2018-08-05 DIAGNOSIS — I779 Disorder of arteries and arterioles, unspecified: Secondary | ICD-10-CM | POA: Diagnosis not present

## 2018-08-05 DIAGNOSIS — Z515 Encounter for palliative care: Secondary | ICD-10-CM | POA: Diagnosis not present

## 2018-08-05 DIAGNOSIS — G300 Alzheimer's disease with early onset: Secondary | ICD-10-CM | POA: Diagnosis not present

## 2018-08-05 DIAGNOSIS — R634 Abnormal weight loss: Secondary | ICD-10-CM | POA: Diagnosis not present

## 2018-08-05 DIAGNOSIS — F0280 Dementia in other diseases classified elsewhere without behavioral disturbance: Secondary | ICD-10-CM | POA: Diagnosis not present

## 2018-08-05 DIAGNOSIS — R451 Restlessness and agitation: Secondary | ICD-10-CM | POA: Diagnosis not present

## 2018-08-05 DIAGNOSIS — I10 Essential (primary) hypertension: Secondary | ICD-10-CM | POA: Diagnosis not present

## 2018-08-06 DIAGNOSIS — Z66 Do not resuscitate: Secondary | ICD-10-CM | POA: Diagnosis not present

## 2018-08-06 DIAGNOSIS — F0280 Dementia in other diseases classified elsewhere without behavioral disturbance: Secondary | ICD-10-CM | POA: Diagnosis not present

## 2018-08-06 DIAGNOSIS — R410 Disorientation, unspecified: Secondary | ICD-10-CM | POA: Diagnosis not present

## 2018-08-06 DIAGNOSIS — I6523 Occlusion and stenosis of bilateral carotid arteries: Secondary | ICD-10-CM | POA: Diagnosis not present

## 2018-08-06 DIAGNOSIS — K5900 Constipation, unspecified: Secondary | ICD-10-CM | POA: Diagnosis not present

## 2018-08-06 DIAGNOSIS — I779 Disorder of arteries and arterioles, unspecified: Secondary | ICD-10-CM | POA: Diagnosis not present

## 2018-08-06 DIAGNOSIS — Z515 Encounter for palliative care: Secondary | ICD-10-CM | POA: Diagnosis not present

## 2018-08-06 DIAGNOSIS — R634 Abnormal weight loss: Secondary | ICD-10-CM | POA: Diagnosis not present

## 2018-08-06 DIAGNOSIS — I69320 Aphasia following cerebral infarction: Secondary | ICD-10-CM | POA: Diagnosis not present

## 2018-08-06 DIAGNOSIS — G300 Alzheimer's disease with early onset: Secondary | ICD-10-CM | POA: Diagnosis not present

## 2018-08-06 DIAGNOSIS — R296 Repeated falls: Secondary | ICD-10-CM | POA: Diagnosis not present

## 2018-08-06 DIAGNOSIS — R451 Restlessness and agitation: Secondary | ICD-10-CM | POA: Diagnosis not present

## 2018-08-06 DIAGNOSIS — I639 Cerebral infarction, unspecified: Secondary | ICD-10-CM | POA: Diagnosis not present

## 2018-08-06 DIAGNOSIS — I10 Essential (primary) hypertension: Secondary | ICD-10-CM | POA: Diagnosis not present

## 2018-08-07 DIAGNOSIS — Z515 Encounter for palliative care: Secondary | ICD-10-CM | POA: Diagnosis not present

## 2018-08-07 DIAGNOSIS — I6523 Occlusion and stenosis of bilateral carotid arteries: Secondary | ICD-10-CM | POA: Diagnosis not present

## 2018-08-07 DIAGNOSIS — R410 Disorientation, unspecified: Secondary | ICD-10-CM | POA: Diagnosis not present

## 2018-08-07 DIAGNOSIS — R451 Restlessness and agitation: Secondary | ICD-10-CM | POA: Diagnosis not present

## 2018-08-07 DIAGNOSIS — I10 Essential (primary) hypertension: Secondary | ICD-10-CM | POA: Diagnosis not present

## 2018-08-07 DIAGNOSIS — R296 Repeated falls: Secondary | ICD-10-CM | POA: Diagnosis not present

## 2018-08-07 DIAGNOSIS — G300 Alzheimer's disease with early onset: Secondary | ICD-10-CM | POA: Diagnosis not present

## 2018-08-07 DIAGNOSIS — Z66 Do not resuscitate: Secondary | ICD-10-CM | POA: Diagnosis not present

## 2018-08-07 DIAGNOSIS — R634 Abnormal weight loss: Secondary | ICD-10-CM | POA: Diagnosis not present

## 2018-08-07 DIAGNOSIS — F0280 Dementia in other diseases classified elsewhere without behavioral disturbance: Secondary | ICD-10-CM | POA: Diagnosis not present

## 2018-08-07 DIAGNOSIS — I779 Disorder of arteries and arterioles, unspecified: Secondary | ICD-10-CM | POA: Diagnosis not present

## 2018-08-08 DIAGNOSIS — R296 Repeated falls: Secondary | ICD-10-CM | POA: Diagnosis not present

## 2018-08-08 DIAGNOSIS — R634 Abnormal weight loss: Secondary | ICD-10-CM | POA: Diagnosis not present

## 2018-08-08 DIAGNOSIS — I10 Essential (primary) hypertension: Secondary | ICD-10-CM | POA: Diagnosis not present

## 2018-08-08 DIAGNOSIS — Z66 Do not resuscitate: Secondary | ICD-10-CM | POA: Diagnosis not present

## 2018-08-08 DIAGNOSIS — Z515 Encounter for palliative care: Secondary | ICD-10-CM | POA: Diagnosis not present

## 2018-08-08 DIAGNOSIS — I779 Disorder of arteries and arterioles, unspecified: Secondary | ICD-10-CM | POA: Diagnosis not present

## 2018-08-08 DIAGNOSIS — R451 Restlessness and agitation: Secondary | ICD-10-CM | POA: Diagnosis not present

## 2018-08-08 DIAGNOSIS — G300 Alzheimer's disease with early onset: Secondary | ICD-10-CM | POA: Diagnosis not present

## 2018-08-08 DIAGNOSIS — F0280 Dementia in other diseases classified elsewhere without behavioral disturbance: Secondary | ICD-10-CM | POA: Diagnosis not present

## 2018-08-08 DIAGNOSIS — R410 Disorientation, unspecified: Secondary | ICD-10-CM | POA: Diagnosis not present

## 2018-08-08 DIAGNOSIS — I6523 Occlusion and stenosis of bilateral carotid arteries: Secondary | ICD-10-CM | POA: Diagnosis not present

## 2018-08-09 DIAGNOSIS — G300 Alzheimer's disease with early onset: Secondary | ICD-10-CM | POA: Diagnosis not present

## 2018-08-09 DIAGNOSIS — I10 Essential (primary) hypertension: Secondary | ICD-10-CM | POA: Diagnosis not present

## 2018-08-09 DIAGNOSIS — F0280 Dementia in other diseases classified elsewhere without behavioral disturbance: Secondary | ICD-10-CM | POA: Diagnosis not present

## 2018-08-09 DIAGNOSIS — R410 Disorientation, unspecified: Secondary | ICD-10-CM | POA: Diagnosis not present

## 2018-08-09 DIAGNOSIS — R634 Abnormal weight loss: Secondary | ICD-10-CM | POA: Diagnosis not present

## 2018-08-09 DIAGNOSIS — Z66 Do not resuscitate: Secondary | ICD-10-CM | POA: Diagnosis not present

## 2018-08-09 DIAGNOSIS — R451 Restlessness and agitation: Secondary | ICD-10-CM | POA: Diagnosis not present

## 2018-08-09 DIAGNOSIS — I779 Disorder of arteries and arterioles, unspecified: Secondary | ICD-10-CM | POA: Diagnosis not present

## 2018-08-09 DIAGNOSIS — Z515 Encounter for palliative care: Secondary | ICD-10-CM | POA: Diagnosis not present

## 2018-08-09 DIAGNOSIS — R296 Repeated falls: Secondary | ICD-10-CM | POA: Diagnosis not present

## 2018-08-09 DIAGNOSIS — I6523 Occlusion and stenosis of bilateral carotid arteries: Secondary | ICD-10-CM | POA: Diagnosis not present

## 2018-08-10 DIAGNOSIS — Z66 Do not resuscitate: Secondary | ICD-10-CM | POA: Diagnosis not present

## 2018-08-10 DIAGNOSIS — I779 Disorder of arteries and arterioles, unspecified: Secondary | ICD-10-CM | POA: Diagnosis not present

## 2018-08-10 DIAGNOSIS — Z515 Encounter for palliative care: Secondary | ICD-10-CM | POA: Diagnosis not present

## 2018-08-10 DIAGNOSIS — I6523 Occlusion and stenosis of bilateral carotid arteries: Secondary | ICD-10-CM | POA: Diagnosis not present

## 2018-08-10 DIAGNOSIS — I10 Essential (primary) hypertension: Secondary | ICD-10-CM | POA: Diagnosis not present

## 2018-08-10 DIAGNOSIS — R410 Disorientation, unspecified: Secondary | ICD-10-CM | POA: Diagnosis not present

## 2018-08-10 DIAGNOSIS — R296 Repeated falls: Secondary | ICD-10-CM | POA: Diagnosis not present

## 2018-08-10 DIAGNOSIS — R451 Restlessness and agitation: Secondary | ICD-10-CM | POA: Diagnosis not present

## 2018-08-10 DIAGNOSIS — F0280 Dementia in other diseases classified elsewhere without behavioral disturbance: Secondary | ICD-10-CM | POA: Diagnosis not present

## 2018-08-10 DIAGNOSIS — G300 Alzheimer's disease with early onset: Secondary | ICD-10-CM | POA: Diagnosis not present

## 2018-08-10 DIAGNOSIS — R634 Abnormal weight loss: Secondary | ICD-10-CM | POA: Diagnosis not present

## 2018-08-11 DIAGNOSIS — Z515 Encounter for palliative care: Secondary | ICD-10-CM | POA: Diagnosis not present

## 2018-08-11 DIAGNOSIS — I779 Disorder of arteries and arterioles, unspecified: Secondary | ICD-10-CM | POA: Diagnosis not present

## 2018-08-11 DIAGNOSIS — R296 Repeated falls: Secondary | ICD-10-CM | POA: Diagnosis not present

## 2018-08-11 DIAGNOSIS — I6523 Occlusion and stenosis of bilateral carotid arteries: Secondary | ICD-10-CM | POA: Diagnosis not present

## 2018-08-11 DIAGNOSIS — R634 Abnormal weight loss: Secondary | ICD-10-CM | POA: Diagnosis not present

## 2018-08-11 DIAGNOSIS — R410 Disorientation, unspecified: Secondary | ICD-10-CM | POA: Diagnosis not present

## 2018-08-11 DIAGNOSIS — F0280 Dementia in other diseases classified elsewhere without behavioral disturbance: Secondary | ICD-10-CM | POA: Diagnosis not present

## 2018-08-11 DIAGNOSIS — G300 Alzheimer's disease with early onset: Secondary | ICD-10-CM | POA: Diagnosis not present

## 2018-08-11 DIAGNOSIS — R451 Restlessness and agitation: Secondary | ICD-10-CM | POA: Diagnosis not present

## 2018-08-11 DIAGNOSIS — I10 Essential (primary) hypertension: Secondary | ICD-10-CM | POA: Diagnosis not present

## 2018-08-11 DIAGNOSIS — Z66 Do not resuscitate: Secondary | ICD-10-CM | POA: Diagnosis not present

## 2018-08-12 DIAGNOSIS — R451 Restlessness and agitation: Secondary | ICD-10-CM | POA: Diagnosis not present

## 2018-08-12 DIAGNOSIS — Z66 Do not resuscitate: Secondary | ICD-10-CM | POA: Diagnosis not present

## 2018-08-12 DIAGNOSIS — R634 Abnormal weight loss: Secondary | ICD-10-CM | POA: Diagnosis not present

## 2018-08-12 DIAGNOSIS — R410 Disorientation, unspecified: Secondary | ICD-10-CM | POA: Diagnosis not present

## 2018-08-12 DIAGNOSIS — I779 Disorder of arteries and arterioles, unspecified: Secondary | ICD-10-CM | POA: Diagnosis not present

## 2018-08-12 DIAGNOSIS — R296 Repeated falls: Secondary | ICD-10-CM | POA: Diagnosis not present

## 2018-08-12 DIAGNOSIS — I6523 Occlusion and stenosis of bilateral carotid arteries: Secondary | ICD-10-CM | POA: Diagnosis not present

## 2018-08-12 DIAGNOSIS — G300 Alzheimer's disease with early onset: Secondary | ICD-10-CM | POA: Diagnosis not present

## 2018-08-12 DIAGNOSIS — F0280 Dementia in other diseases classified elsewhere without behavioral disturbance: Secondary | ICD-10-CM | POA: Diagnosis not present

## 2018-08-12 DIAGNOSIS — Z515 Encounter for palliative care: Secondary | ICD-10-CM | POA: Diagnosis not present

## 2018-08-12 DIAGNOSIS — I10 Essential (primary) hypertension: Secondary | ICD-10-CM | POA: Diagnosis not present

## 2018-08-13 DIAGNOSIS — R296 Repeated falls: Secondary | ICD-10-CM | POA: Diagnosis not present

## 2018-08-13 DIAGNOSIS — I10 Essential (primary) hypertension: Secondary | ICD-10-CM | POA: Diagnosis not present

## 2018-08-13 DIAGNOSIS — R634 Abnormal weight loss: Secondary | ICD-10-CM | POA: Diagnosis not present

## 2018-08-13 DIAGNOSIS — R451 Restlessness and agitation: Secondary | ICD-10-CM | POA: Diagnosis not present

## 2018-08-13 DIAGNOSIS — F0280 Dementia in other diseases classified elsewhere without behavioral disturbance: Secondary | ICD-10-CM | POA: Diagnosis not present

## 2018-08-13 DIAGNOSIS — G300 Alzheimer's disease with early onset: Secondary | ICD-10-CM | POA: Diagnosis not present

## 2018-08-13 DIAGNOSIS — I779 Disorder of arteries and arterioles, unspecified: Secondary | ICD-10-CM | POA: Diagnosis not present

## 2018-08-13 DIAGNOSIS — I6523 Occlusion and stenosis of bilateral carotid arteries: Secondary | ICD-10-CM | POA: Diagnosis not present

## 2018-08-13 DIAGNOSIS — R410 Disorientation, unspecified: Secondary | ICD-10-CM | POA: Diagnosis not present

## 2018-08-13 DIAGNOSIS — Z515 Encounter for palliative care: Secondary | ICD-10-CM | POA: Diagnosis not present

## 2018-08-13 DIAGNOSIS — Z66 Do not resuscitate: Secondary | ICD-10-CM | POA: Diagnosis not present

## 2018-08-14 DIAGNOSIS — R451 Restlessness and agitation: Secondary | ICD-10-CM | POA: Diagnosis not present

## 2018-08-14 DIAGNOSIS — Z515 Encounter for palliative care: Secondary | ICD-10-CM | POA: Diagnosis not present

## 2018-08-14 DIAGNOSIS — R634 Abnormal weight loss: Secondary | ICD-10-CM | POA: Diagnosis not present

## 2018-08-14 DIAGNOSIS — Z66 Do not resuscitate: Secondary | ICD-10-CM | POA: Diagnosis not present

## 2018-08-14 DIAGNOSIS — I779 Disorder of arteries and arterioles, unspecified: Secondary | ICD-10-CM | POA: Diagnosis not present

## 2018-08-14 DIAGNOSIS — I6523 Occlusion and stenosis of bilateral carotid arteries: Secondary | ICD-10-CM | POA: Diagnosis not present

## 2018-08-14 DIAGNOSIS — R410 Disorientation, unspecified: Secondary | ICD-10-CM | POA: Diagnosis not present

## 2018-08-14 DIAGNOSIS — G300 Alzheimer's disease with early onset: Secondary | ICD-10-CM | POA: Diagnosis not present

## 2018-08-14 DIAGNOSIS — R296 Repeated falls: Secondary | ICD-10-CM | POA: Diagnosis not present

## 2018-08-14 DIAGNOSIS — F0280 Dementia in other diseases classified elsewhere without behavioral disturbance: Secondary | ICD-10-CM | POA: Diagnosis not present

## 2018-08-14 DIAGNOSIS — I10 Essential (primary) hypertension: Secondary | ICD-10-CM | POA: Diagnosis not present

## 2018-08-15 DIAGNOSIS — F0280 Dementia in other diseases classified elsewhere without behavioral disturbance: Secondary | ICD-10-CM | POA: Diagnosis not present

## 2018-08-15 DIAGNOSIS — R410 Disorientation, unspecified: Secondary | ICD-10-CM | POA: Diagnosis not present

## 2018-08-15 DIAGNOSIS — G300 Alzheimer's disease with early onset: Secondary | ICD-10-CM | POA: Diagnosis not present

## 2018-08-15 DIAGNOSIS — R296 Repeated falls: Secondary | ICD-10-CM | POA: Diagnosis not present

## 2018-08-15 DIAGNOSIS — I6523 Occlusion and stenosis of bilateral carotid arteries: Secondary | ICD-10-CM | POA: Diagnosis not present

## 2018-08-15 DIAGNOSIS — I779 Disorder of arteries and arterioles, unspecified: Secondary | ICD-10-CM | POA: Diagnosis not present

## 2018-08-15 DIAGNOSIS — Z515 Encounter for palliative care: Secondary | ICD-10-CM | POA: Diagnosis not present

## 2018-08-15 DIAGNOSIS — R451 Restlessness and agitation: Secondary | ICD-10-CM | POA: Diagnosis not present

## 2018-08-15 DIAGNOSIS — Z66 Do not resuscitate: Secondary | ICD-10-CM | POA: Diagnosis not present

## 2018-08-15 DIAGNOSIS — I10 Essential (primary) hypertension: Secondary | ICD-10-CM | POA: Diagnosis not present

## 2018-08-15 DIAGNOSIS — R634 Abnormal weight loss: Secondary | ICD-10-CM | POA: Diagnosis not present

## 2018-08-16 DIAGNOSIS — R296 Repeated falls: Secondary | ICD-10-CM | POA: Diagnosis not present

## 2018-08-16 DIAGNOSIS — I6523 Occlusion and stenosis of bilateral carotid arteries: Secondary | ICD-10-CM | POA: Diagnosis not present

## 2018-08-16 DIAGNOSIS — R451 Restlessness and agitation: Secondary | ICD-10-CM | POA: Diagnosis not present

## 2018-08-16 DIAGNOSIS — Z66 Do not resuscitate: Secondary | ICD-10-CM | POA: Diagnosis not present

## 2018-08-16 DIAGNOSIS — G300 Alzheimer's disease with early onset: Secondary | ICD-10-CM | POA: Diagnosis not present

## 2018-08-16 DIAGNOSIS — R410 Disorientation, unspecified: Secondary | ICD-10-CM | POA: Diagnosis not present

## 2018-08-16 DIAGNOSIS — R634 Abnormal weight loss: Secondary | ICD-10-CM | POA: Diagnosis not present

## 2018-08-16 DIAGNOSIS — I779 Disorder of arteries and arterioles, unspecified: Secondary | ICD-10-CM | POA: Diagnosis not present

## 2018-08-16 DIAGNOSIS — Z515 Encounter for palliative care: Secondary | ICD-10-CM | POA: Diagnosis not present

## 2018-08-16 DIAGNOSIS — I10 Essential (primary) hypertension: Secondary | ICD-10-CM | POA: Diagnosis not present

## 2018-08-16 DIAGNOSIS — F0280 Dementia in other diseases classified elsewhere without behavioral disturbance: Secondary | ICD-10-CM | POA: Diagnosis not present

## 2018-08-17 DIAGNOSIS — I10 Essential (primary) hypertension: Secondary | ICD-10-CM | POA: Diagnosis not present

## 2018-08-17 DIAGNOSIS — F0280 Dementia in other diseases classified elsewhere without behavioral disturbance: Secondary | ICD-10-CM | POA: Diagnosis not present

## 2018-08-17 DIAGNOSIS — R410 Disorientation, unspecified: Secondary | ICD-10-CM | POA: Diagnosis not present

## 2018-08-17 DIAGNOSIS — I6523 Occlusion and stenosis of bilateral carotid arteries: Secondary | ICD-10-CM | POA: Diagnosis not present

## 2018-08-17 DIAGNOSIS — R296 Repeated falls: Secondary | ICD-10-CM | POA: Diagnosis not present

## 2018-08-17 DIAGNOSIS — G300 Alzheimer's disease with early onset: Secondary | ICD-10-CM | POA: Diagnosis not present

## 2018-08-17 DIAGNOSIS — I779 Disorder of arteries and arterioles, unspecified: Secondary | ICD-10-CM | POA: Diagnosis not present

## 2018-08-17 DIAGNOSIS — Z515 Encounter for palliative care: Secondary | ICD-10-CM | POA: Diagnosis not present

## 2018-08-17 DIAGNOSIS — R451 Restlessness and agitation: Secondary | ICD-10-CM | POA: Diagnosis not present

## 2018-08-17 DIAGNOSIS — Z66 Do not resuscitate: Secondary | ICD-10-CM | POA: Diagnosis not present

## 2018-08-17 DIAGNOSIS — R634 Abnormal weight loss: Secondary | ICD-10-CM | POA: Diagnosis not present

## 2018-08-18 DIAGNOSIS — R451 Restlessness and agitation: Secondary | ICD-10-CM | POA: Diagnosis not present

## 2018-08-18 DIAGNOSIS — I10 Essential (primary) hypertension: Secondary | ICD-10-CM | POA: Diagnosis not present

## 2018-08-18 DIAGNOSIS — R410 Disorientation, unspecified: Secondary | ICD-10-CM | POA: Diagnosis not present

## 2018-08-18 DIAGNOSIS — R296 Repeated falls: Secondary | ICD-10-CM | POA: Diagnosis not present

## 2018-08-18 DIAGNOSIS — G300 Alzheimer's disease with early onset: Secondary | ICD-10-CM | POA: Diagnosis not present

## 2018-08-18 DIAGNOSIS — I6523 Occlusion and stenosis of bilateral carotid arteries: Secondary | ICD-10-CM | POA: Diagnosis not present

## 2018-08-18 DIAGNOSIS — Z515 Encounter for palliative care: Secondary | ICD-10-CM | POA: Diagnosis not present

## 2018-08-18 DIAGNOSIS — Z66 Do not resuscitate: Secondary | ICD-10-CM | POA: Diagnosis not present

## 2018-08-18 DIAGNOSIS — I779 Disorder of arteries and arterioles, unspecified: Secondary | ICD-10-CM | POA: Diagnosis not present

## 2018-08-18 DIAGNOSIS — F0280 Dementia in other diseases classified elsewhere without behavioral disturbance: Secondary | ICD-10-CM | POA: Diagnosis not present

## 2018-08-18 DIAGNOSIS — R634 Abnormal weight loss: Secondary | ICD-10-CM | POA: Diagnosis not present

## 2018-08-19 DIAGNOSIS — R410 Disorientation, unspecified: Secondary | ICD-10-CM | POA: Diagnosis not present

## 2018-08-19 DIAGNOSIS — F0280 Dementia in other diseases classified elsewhere without behavioral disturbance: Secondary | ICD-10-CM | POA: Diagnosis not present

## 2018-08-19 DIAGNOSIS — G300 Alzheimer's disease with early onset: Secondary | ICD-10-CM | POA: Diagnosis not present

## 2018-08-19 DIAGNOSIS — R634 Abnormal weight loss: Secondary | ICD-10-CM | POA: Diagnosis not present

## 2018-08-19 DIAGNOSIS — Z66 Do not resuscitate: Secondary | ICD-10-CM | POA: Diagnosis not present

## 2018-08-19 DIAGNOSIS — Z515 Encounter for palliative care: Secondary | ICD-10-CM | POA: Diagnosis not present

## 2018-08-19 DIAGNOSIS — R451 Restlessness and agitation: Secondary | ICD-10-CM | POA: Diagnosis not present

## 2018-08-19 DIAGNOSIS — I10 Essential (primary) hypertension: Secondary | ICD-10-CM | POA: Diagnosis not present

## 2018-08-19 DIAGNOSIS — I779 Disorder of arteries and arterioles, unspecified: Secondary | ICD-10-CM | POA: Diagnosis not present

## 2018-08-19 DIAGNOSIS — I6523 Occlusion and stenosis of bilateral carotid arteries: Secondary | ICD-10-CM | POA: Diagnosis not present

## 2018-08-19 DIAGNOSIS — R296 Repeated falls: Secondary | ICD-10-CM | POA: Diagnosis not present

## 2018-08-20 DIAGNOSIS — R296 Repeated falls: Secondary | ICD-10-CM | POA: Diagnosis not present

## 2018-08-20 DIAGNOSIS — Z515 Encounter for palliative care: Secondary | ICD-10-CM | POA: Diagnosis not present

## 2018-08-20 DIAGNOSIS — R634 Abnormal weight loss: Secondary | ICD-10-CM | POA: Diagnosis not present

## 2018-08-20 DIAGNOSIS — I10 Essential (primary) hypertension: Secondary | ICD-10-CM | POA: Diagnosis not present

## 2018-08-20 DIAGNOSIS — I6523 Occlusion and stenosis of bilateral carotid arteries: Secondary | ICD-10-CM | POA: Diagnosis not present

## 2018-08-20 DIAGNOSIS — F0280 Dementia in other diseases classified elsewhere without behavioral disturbance: Secondary | ICD-10-CM | POA: Diagnosis not present

## 2018-08-20 DIAGNOSIS — R410 Disorientation, unspecified: Secondary | ICD-10-CM | POA: Diagnosis not present

## 2018-08-20 DIAGNOSIS — I779 Disorder of arteries and arterioles, unspecified: Secondary | ICD-10-CM | POA: Diagnosis not present

## 2018-08-20 DIAGNOSIS — R451 Restlessness and agitation: Secondary | ICD-10-CM | POA: Diagnosis not present

## 2018-08-20 DIAGNOSIS — G300 Alzheimer's disease with early onset: Secondary | ICD-10-CM | POA: Diagnosis not present

## 2018-08-20 DIAGNOSIS — Z66 Do not resuscitate: Secondary | ICD-10-CM | POA: Diagnosis not present

## 2018-08-21 DIAGNOSIS — R410 Disorientation, unspecified: Secondary | ICD-10-CM | POA: Diagnosis not present

## 2018-08-21 DIAGNOSIS — I779 Disorder of arteries and arterioles, unspecified: Secondary | ICD-10-CM | POA: Diagnosis not present

## 2018-08-21 DIAGNOSIS — Z515 Encounter for palliative care: Secondary | ICD-10-CM | POA: Diagnosis not present

## 2018-08-21 DIAGNOSIS — R634 Abnormal weight loss: Secondary | ICD-10-CM | POA: Diagnosis not present

## 2018-08-21 DIAGNOSIS — F0280 Dementia in other diseases classified elsewhere without behavioral disturbance: Secondary | ICD-10-CM | POA: Diagnosis not present

## 2018-08-21 DIAGNOSIS — R296 Repeated falls: Secondary | ICD-10-CM | POA: Diagnosis not present

## 2018-08-21 DIAGNOSIS — I6523 Occlusion and stenosis of bilateral carotid arteries: Secondary | ICD-10-CM | POA: Diagnosis not present

## 2018-08-21 DIAGNOSIS — Z66 Do not resuscitate: Secondary | ICD-10-CM | POA: Diagnosis not present

## 2018-08-21 DIAGNOSIS — I10 Essential (primary) hypertension: Secondary | ICD-10-CM | POA: Diagnosis not present

## 2018-08-21 DIAGNOSIS — G300 Alzheimer's disease with early onset: Secondary | ICD-10-CM | POA: Diagnosis not present

## 2018-08-21 DIAGNOSIS — R451 Restlessness and agitation: Secondary | ICD-10-CM | POA: Diagnosis not present

## 2018-08-22 DIAGNOSIS — F0280 Dementia in other diseases classified elsewhere without behavioral disturbance: Secondary | ICD-10-CM | POA: Diagnosis not present

## 2018-08-22 DIAGNOSIS — R634 Abnormal weight loss: Secondary | ICD-10-CM | POA: Diagnosis not present

## 2018-08-22 DIAGNOSIS — I779 Disorder of arteries and arterioles, unspecified: Secondary | ICD-10-CM | POA: Diagnosis not present

## 2018-08-22 DIAGNOSIS — Z66 Do not resuscitate: Secondary | ICD-10-CM | POA: Diagnosis not present

## 2018-08-22 DIAGNOSIS — I6523 Occlusion and stenosis of bilateral carotid arteries: Secondary | ICD-10-CM | POA: Diagnosis not present

## 2018-08-22 DIAGNOSIS — R296 Repeated falls: Secondary | ICD-10-CM | POA: Diagnosis not present

## 2018-08-22 DIAGNOSIS — R451 Restlessness and agitation: Secondary | ICD-10-CM | POA: Diagnosis not present

## 2018-08-22 DIAGNOSIS — I10 Essential (primary) hypertension: Secondary | ICD-10-CM | POA: Diagnosis not present

## 2018-08-22 DIAGNOSIS — G300 Alzheimer's disease with early onset: Secondary | ICD-10-CM | POA: Diagnosis not present

## 2018-08-22 DIAGNOSIS — Z515 Encounter for palliative care: Secondary | ICD-10-CM | POA: Diagnosis not present

## 2018-08-22 DIAGNOSIS — R410 Disorientation, unspecified: Secondary | ICD-10-CM | POA: Diagnosis not present

## 2018-08-23 DIAGNOSIS — Z515 Encounter for palliative care: Secondary | ICD-10-CM | POA: Diagnosis not present

## 2018-08-23 DIAGNOSIS — F0280 Dementia in other diseases classified elsewhere without behavioral disturbance: Secondary | ICD-10-CM | POA: Diagnosis not present

## 2018-08-23 DIAGNOSIS — G300 Alzheimer's disease with early onset: Secondary | ICD-10-CM | POA: Diagnosis not present

## 2018-08-23 DIAGNOSIS — I779 Disorder of arteries and arterioles, unspecified: Secondary | ICD-10-CM | POA: Diagnosis not present

## 2018-08-23 DIAGNOSIS — R296 Repeated falls: Secondary | ICD-10-CM | POA: Diagnosis not present

## 2018-08-23 DIAGNOSIS — R634 Abnormal weight loss: Secondary | ICD-10-CM | POA: Diagnosis not present

## 2018-08-23 DIAGNOSIS — R451 Restlessness and agitation: Secondary | ICD-10-CM | POA: Diagnosis not present

## 2018-08-23 DIAGNOSIS — R410 Disorientation, unspecified: Secondary | ICD-10-CM | POA: Diagnosis not present

## 2018-08-23 DIAGNOSIS — I10 Essential (primary) hypertension: Secondary | ICD-10-CM | POA: Diagnosis not present

## 2018-08-23 DIAGNOSIS — I6523 Occlusion and stenosis of bilateral carotid arteries: Secondary | ICD-10-CM | POA: Diagnosis not present

## 2018-08-23 DIAGNOSIS — Z66 Do not resuscitate: Secondary | ICD-10-CM | POA: Diagnosis not present

## 2018-08-24 DIAGNOSIS — I779 Disorder of arteries and arterioles, unspecified: Secondary | ICD-10-CM | POA: Diagnosis not present

## 2018-08-24 DIAGNOSIS — F0280 Dementia in other diseases classified elsewhere without behavioral disturbance: Secondary | ICD-10-CM | POA: Diagnosis not present

## 2018-08-24 DIAGNOSIS — I10 Essential (primary) hypertension: Secondary | ICD-10-CM | POA: Diagnosis not present

## 2018-08-24 DIAGNOSIS — R296 Repeated falls: Secondary | ICD-10-CM | POA: Diagnosis not present

## 2018-08-24 DIAGNOSIS — G300 Alzheimer's disease with early onset: Secondary | ICD-10-CM | POA: Diagnosis not present

## 2018-08-24 DIAGNOSIS — R634 Abnormal weight loss: Secondary | ICD-10-CM | POA: Diagnosis not present

## 2018-08-24 DIAGNOSIS — I6523 Occlusion and stenosis of bilateral carotid arteries: Secondary | ICD-10-CM | POA: Diagnosis not present

## 2018-08-24 DIAGNOSIS — R410 Disorientation, unspecified: Secondary | ICD-10-CM | POA: Diagnosis not present

## 2018-08-24 DIAGNOSIS — R451 Restlessness and agitation: Secondary | ICD-10-CM | POA: Diagnosis not present

## 2018-08-24 DIAGNOSIS — Z66 Do not resuscitate: Secondary | ICD-10-CM | POA: Diagnosis not present

## 2018-08-24 DIAGNOSIS — Z515 Encounter for palliative care: Secondary | ICD-10-CM | POA: Diagnosis not present

## 2018-08-25 DIAGNOSIS — F0280 Dementia in other diseases classified elsewhere without behavioral disturbance: Secondary | ICD-10-CM | POA: Diagnosis not present

## 2018-08-25 DIAGNOSIS — R296 Repeated falls: Secondary | ICD-10-CM | POA: Diagnosis not present

## 2018-08-25 DIAGNOSIS — I10 Essential (primary) hypertension: Secondary | ICD-10-CM | POA: Diagnosis not present

## 2018-08-25 DIAGNOSIS — I6523 Occlusion and stenosis of bilateral carotid arteries: Secondary | ICD-10-CM | POA: Diagnosis not present

## 2018-08-25 DIAGNOSIS — R410 Disorientation, unspecified: Secondary | ICD-10-CM | POA: Diagnosis not present

## 2018-08-25 DIAGNOSIS — Z66 Do not resuscitate: Secondary | ICD-10-CM | POA: Diagnosis not present

## 2018-08-25 DIAGNOSIS — I779 Disorder of arteries and arterioles, unspecified: Secondary | ICD-10-CM | POA: Diagnosis not present

## 2018-08-25 DIAGNOSIS — Z515 Encounter for palliative care: Secondary | ICD-10-CM | POA: Diagnosis not present

## 2018-08-25 DIAGNOSIS — G300 Alzheimer's disease with early onset: Secondary | ICD-10-CM | POA: Diagnosis not present

## 2018-08-25 DIAGNOSIS — R451 Restlessness and agitation: Secondary | ICD-10-CM | POA: Diagnosis not present

## 2018-08-25 DIAGNOSIS — R634 Abnormal weight loss: Secondary | ICD-10-CM | POA: Diagnosis not present

## 2018-08-26 DIAGNOSIS — R451 Restlessness and agitation: Secondary | ICD-10-CM | POA: Diagnosis not present

## 2018-08-26 DIAGNOSIS — Z66 Do not resuscitate: Secondary | ICD-10-CM | POA: Diagnosis not present

## 2018-08-26 DIAGNOSIS — R410 Disorientation, unspecified: Secondary | ICD-10-CM | POA: Diagnosis not present

## 2018-08-26 DIAGNOSIS — F0280 Dementia in other diseases classified elsewhere without behavioral disturbance: Secondary | ICD-10-CM | POA: Diagnosis not present

## 2018-08-26 DIAGNOSIS — I10 Essential (primary) hypertension: Secondary | ICD-10-CM | POA: Diagnosis not present

## 2018-08-26 DIAGNOSIS — R296 Repeated falls: Secondary | ICD-10-CM | POA: Diagnosis not present

## 2018-08-26 DIAGNOSIS — Z515 Encounter for palliative care: Secondary | ICD-10-CM | POA: Diagnosis not present

## 2018-08-26 DIAGNOSIS — I779 Disorder of arteries and arterioles, unspecified: Secondary | ICD-10-CM | POA: Diagnosis not present

## 2018-08-26 DIAGNOSIS — I6523 Occlusion and stenosis of bilateral carotid arteries: Secondary | ICD-10-CM | POA: Diagnosis not present

## 2018-08-26 DIAGNOSIS — R634 Abnormal weight loss: Secondary | ICD-10-CM | POA: Diagnosis not present

## 2018-08-26 DIAGNOSIS — G300 Alzheimer's disease with early onset: Secondary | ICD-10-CM | POA: Diagnosis not present

## 2018-08-27 DIAGNOSIS — I10 Essential (primary) hypertension: Secondary | ICD-10-CM | POA: Diagnosis not present

## 2018-08-27 DIAGNOSIS — I779 Disorder of arteries and arterioles, unspecified: Secondary | ICD-10-CM | POA: Diagnosis not present

## 2018-08-27 DIAGNOSIS — I6523 Occlusion and stenosis of bilateral carotid arteries: Secondary | ICD-10-CM | POA: Diagnosis not present

## 2018-08-27 DIAGNOSIS — F0280 Dementia in other diseases classified elsewhere without behavioral disturbance: Secondary | ICD-10-CM | POA: Diagnosis not present

## 2018-08-27 DIAGNOSIS — G300 Alzheimer's disease with early onset: Secondary | ICD-10-CM | POA: Diagnosis not present

## 2018-08-27 DIAGNOSIS — R634 Abnormal weight loss: Secondary | ICD-10-CM | POA: Diagnosis not present

## 2018-08-27 DIAGNOSIS — R296 Repeated falls: Secondary | ICD-10-CM | POA: Diagnosis not present

## 2018-08-27 DIAGNOSIS — R410 Disorientation, unspecified: Secondary | ICD-10-CM | POA: Diagnosis not present

## 2018-08-27 DIAGNOSIS — R451 Restlessness and agitation: Secondary | ICD-10-CM | POA: Diagnosis not present

## 2018-08-27 DIAGNOSIS — Z515 Encounter for palliative care: Secondary | ICD-10-CM | POA: Diagnosis not present

## 2018-08-27 DIAGNOSIS — Z66 Do not resuscitate: Secondary | ICD-10-CM | POA: Diagnosis not present

## 2018-08-28 DIAGNOSIS — I779 Disorder of arteries and arterioles, unspecified: Secondary | ICD-10-CM | POA: Diagnosis not present

## 2018-08-28 DIAGNOSIS — R451 Restlessness and agitation: Secondary | ICD-10-CM | POA: Diagnosis not present

## 2018-08-28 DIAGNOSIS — Z66 Do not resuscitate: Secondary | ICD-10-CM | POA: Diagnosis not present

## 2018-08-28 DIAGNOSIS — G300 Alzheimer's disease with early onset: Secondary | ICD-10-CM | POA: Diagnosis not present

## 2018-08-28 DIAGNOSIS — I10 Essential (primary) hypertension: Secondary | ICD-10-CM | POA: Diagnosis not present

## 2018-08-28 DIAGNOSIS — Z515 Encounter for palliative care: Secondary | ICD-10-CM | POA: Diagnosis not present

## 2018-08-28 DIAGNOSIS — R634 Abnormal weight loss: Secondary | ICD-10-CM | POA: Diagnosis not present

## 2018-08-28 DIAGNOSIS — R410 Disorientation, unspecified: Secondary | ICD-10-CM | POA: Diagnosis not present

## 2018-08-28 DIAGNOSIS — F0280 Dementia in other diseases classified elsewhere without behavioral disturbance: Secondary | ICD-10-CM | POA: Diagnosis not present

## 2018-08-28 DIAGNOSIS — I6523 Occlusion and stenosis of bilateral carotid arteries: Secondary | ICD-10-CM | POA: Diagnosis not present

## 2018-08-28 DIAGNOSIS — R296 Repeated falls: Secondary | ICD-10-CM | POA: Diagnosis not present

## 2018-08-29 DIAGNOSIS — I10 Essential (primary) hypertension: Secondary | ICD-10-CM | POA: Diagnosis not present

## 2018-08-29 DIAGNOSIS — R296 Repeated falls: Secondary | ICD-10-CM | POA: Diagnosis not present

## 2018-08-29 DIAGNOSIS — R410 Disorientation, unspecified: Secondary | ICD-10-CM | POA: Diagnosis not present

## 2018-08-29 DIAGNOSIS — Z515 Encounter for palliative care: Secondary | ICD-10-CM | POA: Diagnosis not present

## 2018-08-29 DIAGNOSIS — R451 Restlessness and agitation: Secondary | ICD-10-CM | POA: Diagnosis not present

## 2018-08-29 DIAGNOSIS — I6523 Occlusion and stenosis of bilateral carotid arteries: Secondary | ICD-10-CM | POA: Diagnosis not present

## 2018-08-29 DIAGNOSIS — F0280 Dementia in other diseases classified elsewhere without behavioral disturbance: Secondary | ICD-10-CM | POA: Diagnosis not present

## 2018-08-29 DIAGNOSIS — G300 Alzheimer's disease with early onset: Secondary | ICD-10-CM | POA: Diagnosis not present

## 2018-08-29 DIAGNOSIS — Z66 Do not resuscitate: Secondary | ICD-10-CM | POA: Diagnosis not present

## 2018-08-29 DIAGNOSIS — R634 Abnormal weight loss: Secondary | ICD-10-CM | POA: Diagnosis not present

## 2018-08-29 DIAGNOSIS — I779 Disorder of arteries and arterioles, unspecified: Secondary | ICD-10-CM | POA: Diagnosis not present

## 2018-08-30 DIAGNOSIS — I6523 Occlusion and stenosis of bilateral carotid arteries: Secondary | ICD-10-CM | POA: Diagnosis not present

## 2018-08-30 DIAGNOSIS — R634 Abnormal weight loss: Secondary | ICD-10-CM | POA: Diagnosis not present

## 2018-08-30 DIAGNOSIS — G300 Alzheimer's disease with early onset: Secondary | ICD-10-CM | POA: Diagnosis not present

## 2018-08-30 DIAGNOSIS — R410 Disorientation, unspecified: Secondary | ICD-10-CM | POA: Diagnosis not present

## 2018-08-30 DIAGNOSIS — R451 Restlessness and agitation: Secondary | ICD-10-CM | POA: Diagnosis not present

## 2018-08-30 DIAGNOSIS — I10 Essential (primary) hypertension: Secondary | ICD-10-CM | POA: Diagnosis not present

## 2018-08-30 DIAGNOSIS — I779 Disorder of arteries and arterioles, unspecified: Secondary | ICD-10-CM | POA: Diagnosis not present

## 2018-08-30 DIAGNOSIS — R296 Repeated falls: Secondary | ICD-10-CM | POA: Diagnosis not present

## 2018-08-30 DIAGNOSIS — F0280 Dementia in other diseases classified elsewhere without behavioral disturbance: Secondary | ICD-10-CM | POA: Diagnosis not present

## 2018-08-30 DIAGNOSIS — Z515 Encounter for palliative care: Secondary | ICD-10-CM | POA: Diagnosis not present

## 2018-08-30 DIAGNOSIS — Z66 Do not resuscitate: Secondary | ICD-10-CM | POA: Diagnosis not present

## 2018-08-31 DIAGNOSIS — R410 Disorientation, unspecified: Secondary | ICD-10-CM | POA: Diagnosis not present

## 2018-08-31 DIAGNOSIS — I6523 Occlusion and stenosis of bilateral carotid arteries: Secondary | ICD-10-CM | POA: Diagnosis not present

## 2018-08-31 DIAGNOSIS — R451 Restlessness and agitation: Secondary | ICD-10-CM | POA: Diagnosis not present

## 2018-08-31 DIAGNOSIS — R634 Abnormal weight loss: Secondary | ICD-10-CM | POA: Diagnosis not present

## 2018-08-31 DIAGNOSIS — Z515 Encounter for palliative care: Secondary | ICD-10-CM | POA: Diagnosis not present

## 2018-08-31 DIAGNOSIS — G300 Alzheimer's disease with early onset: Secondary | ICD-10-CM | POA: Diagnosis not present

## 2018-08-31 DIAGNOSIS — F0280 Dementia in other diseases classified elsewhere without behavioral disturbance: Secondary | ICD-10-CM | POA: Diagnosis not present

## 2018-08-31 DIAGNOSIS — Z66 Do not resuscitate: Secondary | ICD-10-CM | POA: Diagnosis not present

## 2018-08-31 DIAGNOSIS — R296 Repeated falls: Secondary | ICD-10-CM | POA: Diagnosis not present

## 2018-08-31 DIAGNOSIS — I779 Disorder of arteries and arterioles, unspecified: Secondary | ICD-10-CM | POA: Diagnosis not present

## 2018-08-31 DIAGNOSIS — I10 Essential (primary) hypertension: Secondary | ICD-10-CM | POA: Diagnosis not present

## 2018-09-01 DIAGNOSIS — I6523 Occlusion and stenosis of bilateral carotid arteries: Secondary | ICD-10-CM | POA: Diagnosis not present

## 2018-09-01 DIAGNOSIS — I779 Disorder of arteries and arterioles, unspecified: Secondary | ICD-10-CM | POA: Diagnosis not present

## 2018-09-01 DIAGNOSIS — R410 Disorientation, unspecified: Secondary | ICD-10-CM | POA: Diagnosis not present

## 2018-09-01 DIAGNOSIS — R296 Repeated falls: Secondary | ICD-10-CM | POA: Diagnosis not present

## 2018-09-01 DIAGNOSIS — I10 Essential (primary) hypertension: Secondary | ICD-10-CM | POA: Diagnosis not present

## 2018-09-01 DIAGNOSIS — R451 Restlessness and agitation: Secondary | ICD-10-CM | POA: Diagnosis not present

## 2018-09-01 DIAGNOSIS — F0280 Dementia in other diseases classified elsewhere without behavioral disturbance: Secondary | ICD-10-CM | POA: Diagnosis not present

## 2018-09-01 DIAGNOSIS — G300 Alzheimer's disease with early onset: Secondary | ICD-10-CM | POA: Diagnosis not present

## 2018-09-01 DIAGNOSIS — Z66 Do not resuscitate: Secondary | ICD-10-CM | POA: Diagnosis not present

## 2018-09-01 DIAGNOSIS — R634 Abnormal weight loss: Secondary | ICD-10-CM | POA: Diagnosis not present

## 2018-09-01 DIAGNOSIS — Z515 Encounter for palliative care: Secondary | ICD-10-CM | POA: Diagnosis not present

## 2018-09-02 DIAGNOSIS — R634 Abnormal weight loss: Secondary | ICD-10-CM | POA: Diagnosis not present

## 2018-09-02 DIAGNOSIS — I6523 Occlusion and stenosis of bilateral carotid arteries: Secondary | ICD-10-CM | POA: Diagnosis not present

## 2018-09-02 DIAGNOSIS — I779 Disorder of arteries and arterioles, unspecified: Secondary | ICD-10-CM | POA: Diagnosis not present

## 2018-09-02 DIAGNOSIS — Z66 Do not resuscitate: Secondary | ICD-10-CM | POA: Diagnosis not present

## 2018-09-02 DIAGNOSIS — R296 Repeated falls: Secondary | ICD-10-CM | POA: Diagnosis not present

## 2018-09-02 DIAGNOSIS — R410 Disorientation, unspecified: Secondary | ICD-10-CM | POA: Diagnosis not present

## 2018-09-02 DIAGNOSIS — I10 Essential (primary) hypertension: Secondary | ICD-10-CM | POA: Diagnosis not present

## 2018-09-02 DIAGNOSIS — G300 Alzheimer's disease with early onset: Secondary | ICD-10-CM | POA: Diagnosis not present

## 2018-09-02 DIAGNOSIS — R451 Restlessness and agitation: Secondary | ICD-10-CM | POA: Diagnosis not present

## 2018-09-02 DIAGNOSIS — Z515 Encounter for palliative care: Secondary | ICD-10-CM | POA: Diagnosis not present

## 2018-09-02 DIAGNOSIS — F0280 Dementia in other diseases classified elsewhere without behavioral disturbance: Secondary | ICD-10-CM | POA: Diagnosis not present

## 2018-09-03 DIAGNOSIS — R634 Abnormal weight loss: Secondary | ICD-10-CM | POA: Diagnosis not present

## 2018-09-03 DIAGNOSIS — I10 Essential (primary) hypertension: Secondary | ICD-10-CM | POA: Diagnosis not present

## 2018-09-03 DIAGNOSIS — F0280 Dementia in other diseases classified elsewhere without behavioral disturbance: Secondary | ICD-10-CM | POA: Diagnosis not present

## 2018-09-03 DIAGNOSIS — G300 Alzheimer's disease with early onset: Secondary | ICD-10-CM | POA: Diagnosis not present

## 2018-09-03 DIAGNOSIS — R451 Restlessness and agitation: Secondary | ICD-10-CM | POA: Diagnosis not present

## 2018-09-03 DIAGNOSIS — R296 Repeated falls: Secondary | ICD-10-CM | POA: Diagnosis not present

## 2018-09-03 DIAGNOSIS — I779 Disorder of arteries and arterioles, unspecified: Secondary | ICD-10-CM | POA: Diagnosis not present

## 2018-09-03 DIAGNOSIS — Z66 Do not resuscitate: Secondary | ICD-10-CM | POA: Diagnosis not present

## 2018-09-03 DIAGNOSIS — R410 Disorientation, unspecified: Secondary | ICD-10-CM | POA: Diagnosis not present

## 2018-09-03 DIAGNOSIS — I6523 Occlusion and stenosis of bilateral carotid arteries: Secondary | ICD-10-CM | POA: Diagnosis not present

## 2018-09-03 DIAGNOSIS — Z515 Encounter for palliative care: Secondary | ICD-10-CM | POA: Diagnosis not present

## 2018-09-04 DIAGNOSIS — I779 Disorder of arteries and arterioles, unspecified: Secondary | ICD-10-CM | POA: Diagnosis not present

## 2018-09-04 DIAGNOSIS — R296 Repeated falls: Secondary | ICD-10-CM | POA: Diagnosis not present

## 2018-09-04 DIAGNOSIS — F0280 Dementia in other diseases classified elsewhere without behavioral disturbance: Secondary | ICD-10-CM | POA: Diagnosis not present

## 2018-09-04 DIAGNOSIS — R410 Disorientation, unspecified: Secondary | ICD-10-CM | POA: Diagnosis not present

## 2018-09-04 DIAGNOSIS — G300 Alzheimer's disease with early onset: Secondary | ICD-10-CM | POA: Diagnosis not present

## 2018-09-04 DIAGNOSIS — I10 Essential (primary) hypertension: Secondary | ICD-10-CM | POA: Diagnosis not present

## 2018-09-04 DIAGNOSIS — R634 Abnormal weight loss: Secondary | ICD-10-CM | POA: Diagnosis not present

## 2018-09-04 DIAGNOSIS — I6523 Occlusion and stenosis of bilateral carotid arteries: Secondary | ICD-10-CM | POA: Diagnosis not present

## 2018-09-04 DIAGNOSIS — Z515 Encounter for palliative care: Secondary | ICD-10-CM | POA: Diagnosis not present

## 2018-09-04 DIAGNOSIS — Z66 Do not resuscitate: Secondary | ICD-10-CM | POA: Diagnosis not present

## 2018-09-04 DIAGNOSIS — R451 Restlessness and agitation: Secondary | ICD-10-CM | POA: Diagnosis not present

## 2018-09-05 DIAGNOSIS — Z66 Do not resuscitate: Secondary | ICD-10-CM | POA: Diagnosis not present

## 2018-09-05 DIAGNOSIS — F0280 Dementia in other diseases classified elsewhere without behavioral disturbance: Secondary | ICD-10-CM | POA: Diagnosis not present

## 2018-09-05 DIAGNOSIS — I10 Essential (primary) hypertension: Secondary | ICD-10-CM | POA: Diagnosis not present

## 2018-09-05 DIAGNOSIS — R296 Repeated falls: Secondary | ICD-10-CM | POA: Diagnosis not present

## 2018-09-05 DIAGNOSIS — R634 Abnormal weight loss: Secondary | ICD-10-CM | POA: Diagnosis not present

## 2018-09-05 DIAGNOSIS — R410 Disorientation, unspecified: Secondary | ICD-10-CM | POA: Diagnosis not present

## 2018-09-05 DIAGNOSIS — G300 Alzheimer's disease with early onset: Secondary | ICD-10-CM | POA: Diagnosis not present

## 2018-09-05 DIAGNOSIS — Z515 Encounter for palliative care: Secondary | ICD-10-CM | POA: Diagnosis not present

## 2018-09-05 DIAGNOSIS — I779 Disorder of arteries and arterioles, unspecified: Secondary | ICD-10-CM | POA: Diagnosis not present

## 2018-09-05 DIAGNOSIS — I6523 Occlusion and stenosis of bilateral carotid arteries: Secondary | ICD-10-CM | POA: Diagnosis not present

## 2018-09-05 DIAGNOSIS — R451 Restlessness and agitation: Secondary | ICD-10-CM | POA: Diagnosis not present

## 2018-09-06 DIAGNOSIS — Z515 Encounter for palliative care: Secondary | ICD-10-CM | POA: Diagnosis not present

## 2018-09-06 DIAGNOSIS — R410 Disorientation, unspecified: Secondary | ICD-10-CM | POA: Diagnosis not present

## 2018-09-06 DIAGNOSIS — F0280 Dementia in other diseases classified elsewhere without behavioral disturbance: Secondary | ICD-10-CM | POA: Diagnosis not present

## 2018-09-06 DIAGNOSIS — G300 Alzheimer's disease with early onset: Secondary | ICD-10-CM | POA: Diagnosis not present

## 2018-09-06 DIAGNOSIS — R634 Abnormal weight loss: Secondary | ICD-10-CM | POA: Diagnosis not present

## 2018-09-06 DIAGNOSIS — R451 Restlessness and agitation: Secondary | ICD-10-CM | POA: Diagnosis not present

## 2018-09-06 DIAGNOSIS — I10 Essential (primary) hypertension: Secondary | ICD-10-CM | POA: Diagnosis not present

## 2018-09-06 DIAGNOSIS — I6523 Occlusion and stenosis of bilateral carotid arteries: Secondary | ICD-10-CM | POA: Diagnosis not present

## 2018-09-06 DIAGNOSIS — I779 Disorder of arteries and arterioles, unspecified: Secondary | ICD-10-CM | POA: Diagnosis not present

## 2018-09-06 DIAGNOSIS — Z66 Do not resuscitate: Secondary | ICD-10-CM | POA: Diagnosis not present

## 2018-09-06 DIAGNOSIS — R296 Repeated falls: Secondary | ICD-10-CM | POA: Diagnosis not present

## 2018-09-07 DIAGNOSIS — R296 Repeated falls: Secondary | ICD-10-CM | POA: Diagnosis not present

## 2018-09-07 DIAGNOSIS — R451 Restlessness and agitation: Secondary | ICD-10-CM | POA: Diagnosis not present

## 2018-09-07 DIAGNOSIS — I779 Disorder of arteries and arterioles, unspecified: Secondary | ICD-10-CM | POA: Diagnosis not present

## 2018-09-07 DIAGNOSIS — Z515 Encounter for palliative care: Secondary | ICD-10-CM | POA: Diagnosis not present

## 2018-09-07 DIAGNOSIS — F0280 Dementia in other diseases classified elsewhere without behavioral disturbance: Secondary | ICD-10-CM | POA: Diagnosis not present

## 2018-09-07 DIAGNOSIS — R410 Disorientation, unspecified: Secondary | ICD-10-CM | POA: Diagnosis not present

## 2018-09-07 DIAGNOSIS — I10 Essential (primary) hypertension: Secondary | ICD-10-CM | POA: Diagnosis not present

## 2018-09-07 DIAGNOSIS — G300 Alzheimer's disease with early onset: Secondary | ICD-10-CM | POA: Diagnosis not present

## 2018-09-07 DIAGNOSIS — R634 Abnormal weight loss: Secondary | ICD-10-CM | POA: Diagnosis not present

## 2018-09-07 DIAGNOSIS — I6523 Occlusion and stenosis of bilateral carotid arteries: Secondary | ICD-10-CM | POA: Diagnosis not present

## 2018-09-07 DIAGNOSIS — Z66 Do not resuscitate: Secondary | ICD-10-CM | POA: Diagnosis not present

## 2018-09-08 DIAGNOSIS — I10 Essential (primary) hypertension: Secondary | ICD-10-CM | POA: Diagnosis not present

## 2018-09-08 DIAGNOSIS — G300 Alzheimer's disease with early onset: Secondary | ICD-10-CM | POA: Diagnosis not present

## 2018-09-08 DIAGNOSIS — F0280 Dementia in other diseases classified elsewhere without behavioral disturbance: Secondary | ICD-10-CM | POA: Diagnosis not present

## 2018-09-08 DIAGNOSIS — I779 Disorder of arteries and arterioles, unspecified: Secondary | ICD-10-CM | POA: Diagnosis not present

## 2018-09-08 DIAGNOSIS — R410 Disorientation, unspecified: Secondary | ICD-10-CM | POA: Diagnosis not present

## 2018-09-08 DIAGNOSIS — R296 Repeated falls: Secondary | ICD-10-CM | POA: Diagnosis not present

## 2018-09-08 DIAGNOSIS — Z515 Encounter for palliative care: Secondary | ICD-10-CM | POA: Diagnosis not present

## 2018-09-08 DIAGNOSIS — R634 Abnormal weight loss: Secondary | ICD-10-CM | POA: Diagnosis not present

## 2018-09-08 DIAGNOSIS — I6523 Occlusion and stenosis of bilateral carotid arteries: Secondary | ICD-10-CM | POA: Diagnosis not present

## 2018-09-08 DIAGNOSIS — R451 Restlessness and agitation: Secondary | ICD-10-CM | POA: Diagnosis not present

## 2018-09-08 DIAGNOSIS — Z66 Do not resuscitate: Secondary | ICD-10-CM | POA: Diagnosis not present

## 2018-09-09 DIAGNOSIS — Z66 Do not resuscitate: Secondary | ICD-10-CM | POA: Diagnosis not present

## 2018-09-09 DIAGNOSIS — G300 Alzheimer's disease with early onset: Secondary | ICD-10-CM | POA: Diagnosis not present

## 2018-09-09 DIAGNOSIS — I10 Essential (primary) hypertension: Secondary | ICD-10-CM | POA: Diagnosis not present

## 2018-09-09 DIAGNOSIS — R410 Disorientation, unspecified: Secondary | ICD-10-CM | POA: Diagnosis not present

## 2018-09-09 DIAGNOSIS — R296 Repeated falls: Secondary | ICD-10-CM | POA: Diagnosis not present

## 2018-09-09 DIAGNOSIS — I6523 Occlusion and stenosis of bilateral carotid arteries: Secondary | ICD-10-CM | POA: Diagnosis not present

## 2018-09-09 DIAGNOSIS — Z515 Encounter for palliative care: Secondary | ICD-10-CM | POA: Diagnosis not present

## 2018-09-09 DIAGNOSIS — I779 Disorder of arteries and arterioles, unspecified: Secondary | ICD-10-CM | POA: Diagnosis not present

## 2018-09-09 DIAGNOSIS — R634 Abnormal weight loss: Secondary | ICD-10-CM | POA: Diagnosis not present

## 2018-09-09 DIAGNOSIS — F0280 Dementia in other diseases classified elsewhere without behavioral disturbance: Secondary | ICD-10-CM | POA: Diagnosis not present

## 2018-09-09 DIAGNOSIS — R451 Restlessness and agitation: Secondary | ICD-10-CM | POA: Diagnosis not present

## 2018-09-10 DIAGNOSIS — Z66 Do not resuscitate: Secondary | ICD-10-CM | POA: Diagnosis not present

## 2018-09-10 DIAGNOSIS — I6523 Occlusion and stenosis of bilateral carotid arteries: Secondary | ICD-10-CM | POA: Diagnosis not present

## 2018-09-10 DIAGNOSIS — I779 Disorder of arteries and arterioles, unspecified: Secondary | ICD-10-CM | POA: Diagnosis not present

## 2018-09-10 DIAGNOSIS — R451 Restlessness and agitation: Secondary | ICD-10-CM | POA: Diagnosis not present

## 2018-09-10 DIAGNOSIS — G300 Alzheimer's disease with early onset: Secondary | ICD-10-CM | POA: Diagnosis not present

## 2018-09-10 DIAGNOSIS — Z515 Encounter for palliative care: Secondary | ICD-10-CM | POA: Diagnosis not present

## 2018-09-10 DIAGNOSIS — R410 Disorientation, unspecified: Secondary | ICD-10-CM | POA: Diagnosis not present

## 2018-09-10 DIAGNOSIS — R634 Abnormal weight loss: Secondary | ICD-10-CM | POA: Diagnosis not present

## 2018-09-10 DIAGNOSIS — I739 Peripheral vascular disease, unspecified: Secondary | ICD-10-CM | POA: Diagnosis not present

## 2018-09-10 DIAGNOSIS — F0280 Dementia in other diseases classified elsewhere without behavioral disturbance: Secondary | ICD-10-CM | POA: Diagnosis not present

## 2018-09-10 DIAGNOSIS — B351 Tinea unguium: Secondary | ICD-10-CM | POA: Diagnosis not present

## 2018-09-10 DIAGNOSIS — Q845 Enlarged and hypertrophic nails: Secondary | ICD-10-CM | POA: Diagnosis not present

## 2018-09-10 DIAGNOSIS — R296 Repeated falls: Secondary | ICD-10-CM | POA: Diagnosis not present

## 2018-09-10 DIAGNOSIS — L603 Nail dystrophy: Secondary | ICD-10-CM | POA: Diagnosis not present

## 2018-09-10 DIAGNOSIS — I10 Essential (primary) hypertension: Secondary | ICD-10-CM | POA: Diagnosis not present

## 2018-09-11 DIAGNOSIS — R634 Abnormal weight loss: Secondary | ICD-10-CM | POA: Diagnosis not present

## 2018-09-11 DIAGNOSIS — F0280 Dementia in other diseases classified elsewhere without behavioral disturbance: Secondary | ICD-10-CM | POA: Diagnosis not present

## 2018-09-11 DIAGNOSIS — Z515 Encounter for palliative care: Secondary | ICD-10-CM | POA: Diagnosis not present

## 2018-09-11 DIAGNOSIS — I779 Disorder of arteries and arterioles, unspecified: Secondary | ICD-10-CM | POA: Diagnosis not present

## 2018-09-11 DIAGNOSIS — R410 Disorientation, unspecified: Secondary | ICD-10-CM | POA: Diagnosis not present

## 2018-09-11 DIAGNOSIS — G300 Alzheimer's disease with early onset: Secondary | ICD-10-CM | POA: Diagnosis not present

## 2018-09-11 DIAGNOSIS — R451 Restlessness and agitation: Secondary | ICD-10-CM | POA: Diagnosis not present

## 2018-09-11 DIAGNOSIS — R296 Repeated falls: Secondary | ICD-10-CM | POA: Diagnosis not present

## 2018-09-11 DIAGNOSIS — I10 Essential (primary) hypertension: Secondary | ICD-10-CM | POA: Diagnosis not present

## 2018-09-11 DIAGNOSIS — Z66 Do not resuscitate: Secondary | ICD-10-CM | POA: Diagnosis not present

## 2018-09-11 DIAGNOSIS — I6523 Occlusion and stenosis of bilateral carotid arteries: Secondary | ICD-10-CM | POA: Diagnosis not present

## 2018-09-12 DIAGNOSIS — Z66 Do not resuscitate: Secondary | ICD-10-CM | POA: Diagnosis not present

## 2018-09-12 DIAGNOSIS — I10 Essential (primary) hypertension: Secondary | ICD-10-CM | POA: Diagnosis not present

## 2018-09-12 DIAGNOSIS — F0280 Dementia in other diseases classified elsewhere without behavioral disturbance: Secondary | ICD-10-CM | POA: Diagnosis not present

## 2018-09-12 DIAGNOSIS — R410 Disorientation, unspecified: Secondary | ICD-10-CM | POA: Diagnosis not present

## 2018-09-12 DIAGNOSIS — I6523 Occlusion and stenosis of bilateral carotid arteries: Secondary | ICD-10-CM | POA: Diagnosis not present

## 2018-09-12 DIAGNOSIS — G300 Alzheimer's disease with early onset: Secondary | ICD-10-CM | POA: Diagnosis not present

## 2018-09-12 DIAGNOSIS — I779 Disorder of arteries and arterioles, unspecified: Secondary | ICD-10-CM | POA: Diagnosis not present

## 2018-09-12 DIAGNOSIS — R451 Restlessness and agitation: Secondary | ICD-10-CM | POA: Diagnosis not present

## 2018-09-12 DIAGNOSIS — R634 Abnormal weight loss: Secondary | ICD-10-CM | POA: Diagnosis not present

## 2018-09-12 DIAGNOSIS — R296 Repeated falls: Secondary | ICD-10-CM | POA: Diagnosis not present

## 2018-09-12 DIAGNOSIS — Z515 Encounter for palliative care: Secondary | ICD-10-CM | POA: Diagnosis not present

## 2018-09-13 DIAGNOSIS — R296 Repeated falls: Secondary | ICD-10-CM | POA: Diagnosis not present

## 2018-09-13 DIAGNOSIS — R634 Abnormal weight loss: Secondary | ICD-10-CM | POA: Diagnosis not present

## 2018-09-13 DIAGNOSIS — Z515 Encounter for palliative care: Secondary | ICD-10-CM | POA: Diagnosis not present

## 2018-09-13 DIAGNOSIS — I779 Disorder of arteries and arterioles, unspecified: Secondary | ICD-10-CM | POA: Diagnosis not present

## 2018-09-13 DIAGNOSIS — R451 Restlessness and agitation: Secondary | ICD-10-CM | POA: Diagnosis not present

## 2018-09-13 DIAGNOSIS — I6523 Occlusion and stenosis of bilateral carotid arteries: Secondary | ICD-10-CM | POA: Diagnosis not present

## 2018-09-13 DIAGNOSIS — Z66 Do not resuscitate: Secondary | ICD-10-CM | POA: Diagnosis not present

## 2018-09-13 DIAGNOSIS — R410 Disorientation, unspecified: Secondary | ICD-10-CM | POA: Diagnosis not present

## 2018-09-13 DIAGNOSIS — F0280 Dementia in other diseases classified elsewhere without behavioral disturbance: Secondary | ICD-10-CM | POA: Diagnosis not present

## 2018-09-13 DIAGNOSIS — I10 Essential (primary) hypertension: Secondary | ICD-10-CM | POA: Diagnosis not present

## 2018-09-13 DIAGNOSIS — G300 Alzheimer's disease with early onset: Secondary | ICD-10-CM | POA: Diagnosis not present

## 2018-09-14 DIAGNOSIS — R296 Repeated falls: Secondary | ICD-10-CM | POA: Diagnosis not present

## 2018-09-14 DIAGNOSIS — G300 Alzheimer's disease with early onset: Secondary | ICD-10-CM | POA: Diagnosis not present

## 2018-09-14 DIAGNOSIS — F0280 Dementia in other diseases classified elsewhere without behavioral disturbance: Secondary | ICD-10-CM | POA: Diagnosis not present

## 2018-09-14 DIAGNOSIS — I10 Essential (primary) hypertension: Secondary | ICD-10-CM | POA: Diagnosis not present

## 2018-09-14 DIAGNOSIS — I6523 Occlusion and stenosis of bilateral carotid arteries: Secondary | ICD-10-CM | POA: Diagnosis not present

## 2018-09-14 DIAGNOSIS — Z66 Do not resuscitate: Secondary | ICD-10-CM | POA: Diagnosis not present

## 2018-09-14 DIAGNOSIS — Z515 Encounter for palliative care: Secondary | ICD-10-CM | POA: Diagnosis not present

## 2018-09-14 DIAGNOSIS — R451 Restlessness and agitation: Secondary | ICD-10-CM | POA: Diagnosis not present

## 2018-09-14 DIAGNOSIS — R634 Abnormal weight loss: Secondary | ICD-10-CM | POA: Diagnosis not present

## 2018-09-14 DIAGNOSIS — R410 Disorientation, unspecified: Secondary | ICD-10-CM | POA: Diagnosis not present

## 2018-09-14 DIAGNOSIS — I779 Disorder of arteries and arterioles, unspecified: Secondary | ICD-10-CM | POA: Diagnosis not present

## 2018-09-15 DIAGNOSIS — Z515 Encounter for palliative care: Secondary | ICD-10-CM | POA: Diagnosis not present

## 2018-09-15 DIAGNOSIS — G300 Alzheimer's disease with early onset: Secondary | ICD-10-CM | POA: Diagnosis not present

## 2018-09-15 DIAGNOSIS — I779 Disorder of arteries and arterioles, unspecified: Secondary | ICD-10-CM | POA: Diagnosis not present

## 2018-09-15 DIAGNOSIS — I10 Essential (primary) hypertension: Secondary | ICD-10-CM | POA: Diagnosis not present

## 2018-09-15 DIAGNOSIS — R634 Abnormal weight loss: Secondary | ICD-10-CM | POA: Diagnosis not present

## 2018-09-15 DIAGNOSIS — F0280 Dementia in other diseases classified elsewhere without behavioral disturbance: Secondary | ICD-10-CM | POA: Diagnosis not present

## 2018-09-15 DIAGNOSIS — Z66 Do not resuscitate: Secondary | ICD-10-CM | POA: Diagnosis not present

## 2018-09-15 DIAGNOSIS — R296 Repeated falls: Secondary | ICD-10-CM | POA: Diagnosis not present

## 2018-09-15 DIAGNOSIS — R451 Restlessness and agitation: Secondary | ICD-10-CM | POA: Diagnosis not present

## 2018-09-15 DIAGNOSIS — R410 Disorientation, unspecified: Secondary | ICD-10-CM | POA: Diagnosis not present

## 2018-09-15 DIAGNOSIS — I6523 Occlusion and stenosis of bilateral carotid arteries: Secondary | ICD-10-CM | POA: Diagnosis not present

## 2018-09-16 DIAGNOSIS — F0280 Dementia in other diseases classified elsewhere without behavioral disturbance: Secondary | ICD-10-CM | POA: Diagnosis not present

## 2018-09-16 DIAGNOSIS — Z515 Encounter for palliative care: Secondary | ICD-10-CM | POA: Diagnosis not present

## 2018-09-16 DIAGNOSIS — I10 Essential (primary) hypertension: Secondary | ICD-10-CM | POA: Diagnosis not present

## 2018-09-16 DIAGNOSIS — I779 Disorder of arteries and arterioles, unspecified: Secondary | ICD-10-CM | POA: Diagnosis not present

## 2018-09-16 DIAGNOSIS — R451 Restlessness and agitation: Secondary | ICD-10-CM | POA: Diagnosis not present

## 2018-09-16 DIAGNOSIS — I6523 Occlusion and stenosis of bilateral carotid arteries: Secondary | ICD-10-CM | POA: Diagnosis not present

## 2018-09-16 DIAGNOSIS — R410 Disorientation, unspecified: Secondary | ICD-10-CM | POA: Diagnosis not present

## 2018-09-16 DIAGNOSIS — R634 Abnormal weight loss: Secondary | ICD-10-CM | POA: Diagnosis not present

## 2018-09-16 DIAGNOSIS — Z66 Do not resuscitate: Secondary | ICD-10-CM | POA: Diagnosis not present

## 2018-09-16 DIAGNOSIS — R296 Repeated falls: Secondary | ICD-10-CM | POA: Diagnosis not present

## 2018-09-16 DIAGNOSIS — G300 Alzheimer's disease with early onset: Secondary | ICD-10-CM | POA: Diagnosis not present

## 2018-09-17 DIAGNOSIS — R634 Abnormal weight loss: Secondary | ICD-10-CM | POA: Diagnosis not present

## 2018-09-17 DIAGNOSIS — F0280 Dementia in other diseases classified elsewhere without behavioral disturbance: Secondary | ICD-10-CM | POA: Diagnosis not present

## 2018-09-17 DIAGNOSIS — G300 Alzheimer's disease with early onset: Secondary | ICD-10-CM | POA: Diagnosis not present

## 2018-09-17 DIAGNOSIS — R410 Disorientation, unspecified: Secondary | ICD-10-CM | POA: Diagnosis not present

## 2018-09-17 DIAGNOSIS — Z66 Do not resuscitate: Secondary | ICD-10-CM | POA: Diagnosis not present

## 2018-09-17 DIAGNOSIS — I779 Disorder of arteries and arterioles, unspecified: Secondary | ICD-10-CM | POA: Diagnosis not present

## 2018-09-17 DIAGNOSIS — R451 Restlessness and agitation: Secondary | ICD-10-CM | POA: Diagnosis not present

## 2018-09-17 DIAGNOSIS — R296 Repeated falls: Secondary | ICD-10-CM | POA: Diagnosis not present

## 2018-09-17 DIAGNOSIS — I6523 Occlusion and stenosis of bilateral carotid arteries: Secondary | ICD-10-CM | POA: Diagnosis not present

## 2018-09-17 DIAGNOSIS — I10 Essential (primary) hypertension: Secondary | ICD-10-CM | POA: Diagnosis not present

## 2018-09-17 DIAGNOSIS — Z515 Encounter for palliative care: Secondary | ICD-10-CM | POA: Diagnosis not present

## 2018-09-18 DIAGNOSIS — F0280 Dementia in other diseases classified elsewhere without behavioral disturbance: Secondary | ICD-10-CM | POA: Diagnosis not present

## 2018-09-18 DIAGNOSIS — Z515 Encounter for palliative care: Secondary | ICD-10-CM | POA: Diagnosis not present

## 2018-09-18 DIAGNOSIS — R634 Abnormal weight loss: Secondary | ICD-10-CM | POA: Diagnosis not present

## 2018-09-18 DIAGNOSIS — I6523 Occlusion and stenosis of bilateral carotid arteries: Secondary | ICD-10-CM | POA: Diagnosis not present

## 2018-09-18 DIAGNOSIS — G300 Alzheimer's disease with early onset: Secondary | ICD-10-CM | POA: Diagnosis not present

## 2018-09-18 DIAGNOSIS — R296 Repeated falls: Secondary | ICD-10-CM | POA: Diagnosis not present

## 2018-09-18 DIAGNOSIS — R410 Disorientation, unspecified: Secondary | ICD-10-CM | POA: Diagnosis not present

## 2018-09-18 DIAGNOSIS — I779 Disorder of arteries and arterioles, unspecified: Secondary | ICD-10-CM | POA: Diagnosis not present

## 2018-09-18 DIAGNOSIS — R451 Restlessness and agitation: Secondary | ICD-10-CM | POA: Diagnosis not present

## 2018-09-18 DIAGNOSIS — Z66 Do not resuscitate: Secondary | ICD-10-CM | POA: Diagnosis not present

## 2018-09-18 DIAGNOSIS — I10 Essential (primary) hypertension: Secondary | ICD-10-CM | POA: Diagnosis not present

## 2018-09-19 DIAGNOSIS — Z515 Encounter for palliative care: Secondary | ICD-10-CM | POA: Diagnosis not present

## 2018-09-19 DIAGNOSIS — I10 Essential (primary) hypertension: Secondary | ICD-10-CM | POA: Diagnosis not present

## 2018-09-19 DIAGNOSIS — I779 Disorder of arteries and arterioles, unspecified: Secondary | ICD-10-CM | POA: Diagnosis not present

## 2018-09-19 DIAGNOSIS — G300 Alzheimer's disease with early onset: Secondary | ICD-10-CM | POA: Diagnosis not present

## 2018-09-19 DIAGNOSIS — R410 Disorientation, unspecified: Secondary | ICD-10-CM | POA: Diagnosis not present

## 2018-09-19 DIAGNOSIS — Z66 Do not resuscitate: Secondary | ICD-10-CM | POA: Diagnosis not present

## 2018-09-19 DIAGNOSIS — R451 Restlessness and agitation: Secondary | ICD-10-CM | POA: Diagnosis not present

## 2018-09-19 DIAGNOSIS — I6523 Occlusion and stenosis of bilateral carotid arteries: Secondary | ICD-10-CM | POA: Diagnosis not present

## 2018-09-19 DIAGNOSIS — R296 Repeated falls: Secondary | ICD-10-CM | POA: Diagnosis not present

## 2018-09-19 DIAGNOSIS — F0280 Dementia in other diseases classified elsewhere without behavioral disturbance: Secondary | ICD-10-CM | POA: Diagnosis not present

## 2018-09-19 DIAGNOSIS — R634 Abnormal weight loss: Secondary | ICD-10-CM | POA: Diagnosis not present

## 2018-09-20 DIAGNOSIS — I10 Essential (primary) hypertension: Secondary | ICD-10-CM | POA: Diagnosis not present

## 2018-09-20 DIAGNOSIS — R410 Disorientation, unspecified: Secondary | ICD-10-CM | POA: Diagnosis not present

## 2018-09-20 DIAGNOSIS — R451 Restlessness and agitation: Secondary | ICD-10-CM | POA: Diagnosis not present

## 2018-09-20 DIAGNOSIS — Z66 Do not resuscitate: Secondary | ICD-10-CM | POA: Diagnosis not present

## 2018-09-20 DIAGNOSIS — F0280 Dementia in other diseases classified elsewhere without behavioral disturbance: Secondary | ICD-10-CM | POA: Diagnosis not present

## 2018-09-20 DIAGNOSIS — I6523 Occlusion and stenosis of bilateral carotid arteries: Secondary | ICD-10-CM | POA: Diagnosis not present

## 2018-09-20 DIAGNOSIS — G300 Alzheimer's disease with early onset: Secondary | ICD-10-CM | POA: Diagnosis not present

## 2018-09-20 DIAGNOSIS — Z515 Encounter for palliative care: Secondary | ICD-10-CM | POA: Diagnosis not present

## 2018-09-20 DIAGNOSIS — R634 Abnormal weight loss: Secondary | ICD-10-CM | POA: Diagnosis not present

## 2018-09-20 DIAGNOSIS — I779 Disorder of arteries and arterioles, unspecified: Secondary | ICD-10-CM | POA: Diagnosis not present

## 2018-09-20 DIAGNOSIS — R296 Repeated falls: Secondary | ICD-10-CM | POA: Diagnosis not present

## 2018-09-21 DIAGNOSIS — G300 Alzheimer's disease with early onset: Secondary | ICD-10-CM | POA: Diagnosis not present

## 2018-09-21 DIAGNOSIS — I779 Disorder of arteries and arterioles, unspecified: Secondary | ICD-10-CM | POA: Diagnosis not present

## 2018-09-21 DIAGNOSIS — Z66 Do not resuscitate: Secondary | ICD-10-CM | POA: Diagnosis not present

## 2018-09-21 DIAGNOSIS — R410 Disorientation, unspecified: Secondary | ICD-10-CM | POA: Diagnosis not present

## 2018-09-21 DIAGNOSIS — R634 Abnormal weight loss: Secondary | ICD-10-CM | POA: Diagnosis not present

## 2018-09-21 DIAGNOSIS — Z515 Encounter for palliative care: Secondary | ICD-10-CM | POA: Diagnosis not present

## 2018-09-21 DIAGNOSIS — F0280 Dementia in other diseases classified elsewhere without behavioral disturbance: Secondary | ICD-10-CM | POA: Diagnosis not present

## 2018-09-21 DIAGNOSIS — R451 Restlessness and agitation: Secondary | ICD-10-CM | POA: Diagnosis not present

## 2018-09-21 DIAGNOSIS — I6523 Occlusion and stenosis of bilateral carotid arteries: Secondary | ICD-10-CM | POA: Diagnosis not present

## 2018-09-21 DIAGNOSIS — I10 Essential (primary) hypertension: Secondary | ICD-10-CM | POA: Diagnosis not present

## 2018-09-21 DIAGNOSIS — R296 Repeated falls: Secondary | ICD-10-CM | POA: Diagnosis not present

## 2018-09-22 DIAGNOSIS — G300 Alzheimer's disease with early onset: Secondary | ICD-10-CM | POA: Diagnosis not present

## 2018-09-22 DIAGNOSIS — R410 Disorientation, unspecified: Secondary | ICD-10-CM | POA: Diagnosis not present

## 2018-09-22 DIAGNOSIS — Z66 Do not resuscitate: Secondary | ICD-10-CM | POA: Diagnosis not present

## 2018-09-22 DIAGNOSIS — F0280 Dementia in other diseases classified elsewhere without behavioral disturbance: Secondary | ICD-10-CM | POA: Diagnosis not present

## 2018-09-22 DIAGNOSIS — I779 Disorder of arteries and arterioles, unspecified: Secondary | ICD-10-CM | POA: Diagnosis not present

## 2018-09-22 DIAGNOSIS — Z515 Encounter for palliative care: Secondary | ICD-10-CM | POA: Diagnosis not present

## 2018-09-22 DIAGNOSIS — I6523 Occlusion and stenosis of bilateral carotid arteries: Secondary | ICD-10-CM | POA: Diagnosis not present

## 2018-09-22 DIAGNOSIS — R634 Abnormal weight loss: Secondary | ICD-10-CM | POA: Diagnosis not present

## 2018-09-22 DIAGNOSIS — R451 Restlessness and agitation: Secondary | ICD-10-CM | POA: Diagnosis not present

## 2018-09-22 DIAGNOSIS — I10 Essential (primary) hypertension: Secondary | ICD-10-CM | POA: Diagnosis not present

## 2018-09-22 DIAGNOSIS — R296 Repeated falls: Secondary | ICD-10-CM | POA: Diagnosis not present

## 2018-09-23 DIAGNOSIS — R410 Disorientation, unspecified: Secondary | ICD-10-CM | POA: Diagnosis not present

## 2018-09-23 DIAGNOSIS — I779 Disorder of arteries and arterioles, unspecified: Secondary | ICD-10-CM | POA: Diagnosis not present

## 2018-09-23 DIAGNOSIS — F0280 Dementia in other diseases classified elsewhere without behavioral disturbance: Secondary | ICD-10-CM | POA: Diagnosis not present

## 2018-09-23 DIAGNOSIS — R296 Repeated falls: Secondary | ICD-10-CM | POA: Diagnosis not present

## 2018-09-23 DIAGNOSIS — R634 Abnormal weight loss: Secondary | ICD-10-CM | POA: Diagnosis not present

## 2018-09-23 DIAGNOSIS — G300 Alzheimer's disease with early onset: Secondary | ICD-10-CM | POA: Diagnosis not present

## 2018-09-23 DIAGNOSIS — I6523 Occlusion and stenosis of bilateral carotid arteries: Secondary | ICD-10-CM | POA: Diagnosis not present

## 2018-09-23 DIAGNOSIS — R451 Restlessness and agitation: Secondary | ICD-10-CM | POA: Diagnosis not present

## 2018-09-23 DIAGNOSIS — Z515 Encounter for palliative care: Secondary | ICD-10-CM | POA: Diagnosis not present

## 2018-09-23 DIAGNOSIS — Z66 Do not resuscitate: Secondary | ICD-10-CM | POA: Diagnosis not present

## 2018-09-23 DIAGNOSIS — I10 Essential (primary) hypertension: Secondary | ICD-10-CM | POA: Diagnosis not present

## 2018-09-24 DIAGNOSIS — I10 Essential (primary) hypertension: Secondary | ICD-10-CM | POA: Diagnosis not present

## 2018-09-24 DIAGNOSIS — R296 Repeated falls: Secondary | ICD-10-CM | POA: Diagnosis not present

## 2018-09-24 DIAGNOSIS — Z66 Do not resuscitate: Secondary | ICD-10-CM | POA: Diagnosis not present

## 2018-09-24 DIAGNOSIS — I6523 Occlusion and stenosis of bilateral carotid arteries: Secondary | ICD-10-CM | POA: Diagnosis not present

## 2018-09-24 DIAGNOSIS — Z515 Encounter for palliative care: Secondary | ICD-10-CM | POA: Diagnosis not present

## 2018-09-24 DIAGNOSIS — R410 Disorientation, unspecified: Secondary | ICD-10-CM | POA: Diagnosis not present

## 2018-09-24 DIAGNOSIS — F0280 Dementia in other diseases classified elsewhere without behavioral disturbance: Secondary | ICD-10-CM | POA: Diagnosis not present

## 2018-09-24 DIAGNOSIS — R451 Restlessness and agitation: Secondary | ICD-10-CM | POA: Diagnosis not present

## 2018-09-24 DIAGNOSIS — F419 Anxiety disorder, unspecified: Secondary | ICD-10-CM | POA: Diagnosis not present

## 2018-09-24 DIAGNOSIS — R634 Abnormal weight loss: Secondary | ICD-10-CM | POA: Diagnosis not present

## 2018-09-24 DIAGNOSIS — I779 Disorder of arteries and arterioles, unspecified: Secondary | ICD-10-CM | POA: Diagnosis not present

## 2018-09-24 DIAGNOSIS — F0150 Vascular dementia without behavioral disturbance: Secondary | ICD-10-CM | POA: Diagnosis not present

## 2018-09-24 DIAGNOSIS — G300 Alzheimer's disease with early onset: Secondary | ICD-10-CM | POA: Diagnosis not present

## 2018-09-25 DIAGNOSIS — F0280 Dementia in other diseases classified elsewhere without behavioral disturbance: Secondary | ICD-10-CM | POA: Diagnosis not present

## 2018-09-25 DIAGNOSIS — R451 Restlessness and agitation: Secondary | ICD-10-CM | POA: Diagnosis not present

## 2018-09-25 DIAGNOSIS — R634 Abnormal weight loss: Secondary | ICD-10-CM | POA: Diagnosis not present

## 2018-09-25 DIAGNOSIS — Z66 Do not resuscitate: Secondary | ICD-10-CM | POA: Diagnosis not present

## 2018-09-25 DIAGNOSIS — Z515 Encounter for palliative care: Secondary | ICD-10-CM | POA: Diagnosis not present

## 2018-09-25 DIAGNOSIS — I779 Disorder of arteries and arterioles, unspecified: Secondary | ICD-10-CM | POA: Diagnosis not present

## 2018-09-25 DIAGNOSIS — G300 Alzheimer's disease with early onset: Secondary | ICD-10-CM | POA: Diagnosis not present

## 2018-09-25 DIAGNOSIS — R410 Disorientation, unspecified: Secondary | ICD-10-CM | POA: Diagnosis not present

## 2018-09-25 DIAGNOSIS — I6523 Occlusion and stenosis of bilateral carotid arteries: Secondary | ICD-10-CM | POA: Diagnosis not present

## 2018-09-25 DIAGNOSIS — R296 Repeated falls: Secondary | ICD-10-CM | POA: Diagnosis not present

## 2018-09-25 DIAGNOSIS — I10 Essential (primary) hypertension: Secondary | ICD-10-CM | POA: Diagnosis not present

## 2018-09-26 DIAGNOSIS — R451 Restlessness and agitation: Secondary | ICD-10-CM | POA: Diagnosis not present

## 2018-09-26 DIAGNOSIS — Z66 Do not resuscitate: Secondary | ICD-10-CM | POA: Diagnosis not present

## 2018-09-26 DIAGNOSIS — G300 Alzheimer's disease with early onset: Secondary | ICD-10-CM | POA: Diagnosis not present

## 2018-09-26 DIAGNOSIS — Z515 Encounter for palliative care: Secondary | ICD-10-CM | POA: Diagnosis not present

## 2018-09-26 DIAGNOSIS — R634 Abnormal weight loss: Secondary | ICD-10-CM | POA: Diagnosis not present

## 2018-09-26 DIAGNOSIS — I6523 Occlusion and stenosis of bilateral carotid arteries: Secondary | ICD-10-CM | POA: Diagnosis not present

## 2018-09-26 DIAGNOSIS — F0280 Dementia in other diseases classified elsewhere without behavioral disturbance: Secondary | ICD-10-CM | POA: Diagnosis not present

## 2018-09-26 DIAGNOSIS — R410 Disorientation, unspecified: Secondary | ICD-10-CM | POA: Diagnosis not present

## 2018-09-26 DIAGNOSIS — I779 Disorder of arteries and arterioles, unspecified: Secondary | ICD-10-CM | POA: Diagnosis not present

## 2018-09-26 DIAGNOSIS — I10 Essential (primary) hypertension: Secondary | ICD-10-CM | POA: Diagnosis not present

## 2018-09-26 DIAGNOSIS — R296 Repeated falls: Secondary | ICD-10-CM | POA: Diagnosis not present

## 2018-09-27 DIAGNOSIS — Z66 Do not resuscitate: Secondary | ICD-10-CM | POA: Diagnosis not present

## 2018-09-27 DIAGNOSIS — G300 Alzheimer's disease with early onset: Secondary | ICD-10-CM | POA: Diagnosis not present

## 2018-09-27 DIAGNOSIS — I779 Disorder of arteries and arterioles, unspecified: Secondary | ICD-10-CM | POA: Diagnosis not present

## 2018-09-27 DIAGNOSIS — R410 Disorientation, unspecified: Secondary | ICD-10-CM | POA: Diagnosis not present

## 2018-09-27 DIAGNOSIS — R634 Abnormal weight loss: Secondary | ICD-10-CM | POA: Diagnosis not present

## 2018-09-27 DIAGNOSIS — I10 Essential (primary) hypertension: Secondary | ICD-10-CM | POA: Diagnosis not present

## 2018-09-27 DIAGNOSIS — Z515 Encounter for palliative care: Secondary | ICD-10-CM | POA: Diagnosis not present

## 2018-09-27 DIAGNOSIS — R451 Restlessness and agitation: Secondary | ICD-10-CM | POA: Diagnosis not present

## 2018-09-27 DIAGNOSIS — R296 Repeated falls: Secondary | ICD-10-CM | POA: Diagnosis not present

## 2018-09-27 DIAGNOSIS — F0280 Dementia in other diseases classified elsewhere without behavioral disturbance: Secondary | ICD-10-CM | POA: Diagnosis not present

## 2018-09-27 DIAGNOSIS — I6523 Occlusion and stenosis of bilateral carotid arteries: Secondary | ICD-10-CM | POA: Diagnosis not present

## 2018-09-28 DIAGNOSIS — I6523 Occlusion and stenosis of bilateral carotid arteries: Secondary | ICD-10-CM | POA: Diagnosis not present

## 2018-09-28 DIAGNOSIS — R410 Disorientation, unspecified: Secondary | ICD-10-CM | POA: Diagnosis not present

## 2018-09-28 DIAGNOSIS — R451 Restlessness and agitation: Secondary | ICD-10-CM | POA: Diagnosis not present

## 2018-09-28 DIAGNOSIS — G300 Alzheimer's disease with early onset: Secondary | ICD-10-CM | POA: Diagnosis not present

## 2018-09-28 DIAGNOSIS — F0280 Dementia in other diseases classified elsewhere without behavioral disturbance: Secondary | ICD-10-CM | POA: Diagnosis not present

## 2018-09-28 DIAGNOSIS — Z66 Do not resuscitate: Secondary | ICD-10-CM | POA: Diagnosis not present

## 2018-09-28 DIAGNOSIS — Z515 Encounter for palliative care: Secondary | ICD-10-CM | POA: Diagnosis not present

## 2018-09-28 DIAGNOSIS — R634 Abnormal weight loss: Secondary | ICD-10-CM | POA: Diagnosis not present

## 2018-09-28 DIAGNOSIS — R296 Repeated falls: Secondary | ICD-10-CM | POA: Diagnosis not present

## 2018-09-28 DIAGNOSIS — I779 Disorder of arteries and arterioles, unspecified: Secondary | ICD-10-CM | POA: Diagnosis not present

## 2018-09-28 DIAGNOSIS — I10 Essential (primary) hypertension: Secondary | ICD-10-CM | POA: Diagnosis not present

## 2018-09-29 DIAGNOSIS — R410 Disorientation, unspecified: Secondary | ICD-10-CM | POA: Diagnosis not present

## 2018-09-29 DIAGNOSIS — G300 Alzheimer's disease with early onset: Secondary | ICD-10-CM | POA: Diagnosis not present

## 2018-09-29 DIAGNOSIS — I10 Essential (primary) hypertension: Secondary | ICD-10-CM | POA: Diagnosis not present

## 2018-09-29 DIAGNOSIS — R296 Repeated falls: Secondary | ICD-10-CM | POA: Diagnosis not present

## 2018-09-29 DIAGNOSIS — I6523 Occlusion and stenosis of bilateral carotid arteries: Secondary | ICD-10-CM | POA: Diagnosis not present

## 2018-09-29 DIAGNOSIS — R451 Restlessness and agitation: Secondary | ICD-10-CM | POA: Diagnosis not present

## 2018-09-29 DIAGNOSIS — Z66 Do not resuscitate: Secondary | ICD-10-CM | POA: Diagnosis not present

## 2018-09-29 DIAGNOSIS — Z515 Encounter for palliative care: Secondary | ICD-10-CM | POA: Diagnosis not present

## 2018-09-29 DIAGNOSIS — F0280 Dementia in other diseases classified elsewhere without behavioral disturbance: Secondary | ICD-10-CM | POA: Diagnosis not present

## 2018-09-29 DIAGNOSIS — I779 Disorder of arteries and arterioles, unspecified: Secondary | ICD-10-CM | POA: Diagnosis not present

## 2018-09-29 DIAGNOSIS — R634 Abnormal weight loss: Secondary | ICD-10-CM | POA: Diagnosis not present

## 2018-09-30 DIAGNOSIS — R410 Disorientation, unspecified: Secondary | ICD-10-CM | POA: Diagnosis not present

## 2018-09-30 DIAGNOSIS — Z515 Encounter for palliative care: Secondary | ICD-10-CM | POA: Diagnosis not present

## 2018-09-30 DIAGNOSIS — I6523 Occlusion and stenosis of bilateral carotid arteries: Secondary | ICD-10-CM | POA: Diagnosis not present

## 2018-09-30 DIAGNOSIS — R451 Restlessness and agitation: Secondary | ICD-10-CM | POA: Diagnosis not present

## 2018-09-30 DIAGNOSIS — F0280 Dementia in other diseases classified elsewhere without behavioral disturbance: Secondary | ICD-10-CM | POA: Diagnosis not present

## 2018-09-30 DIAGNOSIS — Z66 Do not resuscitate: Secondary | ICD-10-CM | POA: Diagnosis not present

## 2018-09-30 DIAGNOSIS — R634 Abnormal weight loss: Secondary | ICD-10-CM | POA: Diagnosis not present

## 2018-09-30 DIAGNOSIS — G300 Alzheimer's disease with early onset: Secondary | ICD-10-CM | POA: Diagnosis not present

## 2018-09-30 DIAGNOSIS — I779 Disorder of arteries and arterioles, unspecified: Secondary | ICD-10-CM | POA: Diagnosis not present

## 2018-09-30 DIAGNOSIS — I10 Essential (primary) hypertension: Secondary | ICD-10-CM | POA: Diagnosis not present

## 2018-09-30 DIAGNOSIS — R296 Repeated falls: Secondary | ICD-10-CM | POA: Diagnosis not present

## 2018-10-01 DIAGNOSIS — Z515 Encounter for palliative care: Secondary | ICD-10-CM | POA: Diagnosis not present

## 2018-10-01 DIAGNOSIS — I10 Essential (primary) hypertension: Secondary | ICD-10-CM | POA: Diagnosis not present

## 2018-10-01 DIAGNOSIS — Z66 Do not resuscitate: Secondary | ICD-10-CM | POA: Diagnosis not present

## 2018-10-01 DIAGNOSIS — R296 Repeated falls: Secondary | ICD-10-CM | POA: Diagnosis not present

## 2018-10-01 DIAGNOSIS — F0280 Dementia in other diseases classified elsewhere without behavioral disturbance: Secondary | ICD-10-CM | POA: Diagnosis not present

## 2018-10-01 DIAGNOSIS — I6523 Occlusion and stenosis of bilateral carotid arteries: Secondary | ICD-10-CM | POA: Diagnosis not present

## 2018-10-01 DIAGNOSIS — R634 Abnormal weight loss: Secondary | ICD-10-CM | POA: Diagnosis not present

## 2018-10-01 DIAGNOSIS — G300 Alzheimer's disease with early onset: Secondary | ICD-10-CM | POA: Diagnosis not present

## 2018-10-01 DIAGNOSIS — I779 Disorder of arteries and arterioles, unspecified: Secondary | ICD-10-CM | POA: Diagnosis not present

## 2018-10-01 DIAGNOSIS — R451 Restlessness and agitation: Secondary | ICD-10-CM | POA: Diagnosis not present

## 2018-10-01 DIAGNOSIS — R410 Disorientation, unspecified: Secondary | ICD-10-CM | POA: Diagnosis not present

## 2018-10-02 DIAGNOSIS — Z66 Do not resuscitate: Secondary | ICD-10-CM | POA: Diagnosis not present

## 2018-10-02 DIAGNOSIS — F0280 Dementia in other diseases classified elsewhere without behavioral disturbance: Secondary | ICD-10-CM | POA: Diagnosis not present

## 2018-10-02 DIAGNOSIS — G300 Alzheimer's disease with early onset: Secondary | ICD-10-CM | POA: Diagnosis not present

## 2018-10-02 DIAGNOSIS — R296 Repeated falls: Secondary | ICD-10-CM | POA: Diagnosis not present

## 2018-10-02 DIAGNOSIS — R634 Abnormal weight loss: Secondary | ICD-10-CM | POA: Diagnosis not present

## 2018-10-02 DIAGNOSIS — I6523 Occlusion and stenosis of bilateral carotid arteries: Secondary | ICD-10-CM | POA: Diagnosis not present

## 2018-10-02 DIAGNOSIS — Z515 Encounter for palliative care: Secondary | ICD-10-CM | POA: Diagnosis not present

## 2018-10-02 DIAGNOSIS — I779 Disorder of arteries and arterioles, unspecified: Secondary | ICD-10-CM | POA: Diagnosis not present

## 2018-10-02 DIAGNOSIS — R410 Disorientation, unspecified: Secondary | ICD-10-CM | POA: Diagnosis not present

## 2018-10-02 DIAGNOSIS — I10 Essential (primary) hypertension: Secondary | ICD-10-CM | POA: Diagnosis not present

## 2018-10-02 DIAGNOSIS — R451 Restlessness and agitation: Secondary | ICD-10-CM | POA: Diagnosis not present

## 2018-10-03 DIAGNOSIS — F0280 Dementia in other diseases classified elsewhere without behavioral disturbance: Secondary | ICD-10-CM | POA: Diagnosis not present

## 2018-10-03 DIAGNOSIS — Z515 Encounter for palliative care: Secondary | ICD-10-CM | POA: Diagnosis not present

## 2018-10-03 DIAGNOSIS — I10 Essential (primary) hypertension: Secondary | ICD-10-CM | POA: Diagnosis not present

## 2018-10-03 DIAGNOSIS — I6523 Occlusion and stenosis of bilateral carotid arteries: Secondary | ICD-10-CM | POA: Diagnosis not present

## 2018-10-03 DIAGNOSIS — Z66 Do not resuscitate: Secondary | ICD-10-CM | POA: Diagnosis not present

## 2018-10-03 DIAGNOSIS — R410 Disorientation, unspecified: Secondary | ICD-10-CM | POA: Diagnosis not present

## 2018-10-03 DIAGNOSIS — G300 Alzheimer's disease with early onset: Secondary | ICD-10-CM | POA: Diagnosis not present

## 2018-10-03 DIAGNOSIS — I779 Disorder of arteries and arterioles, unspecified: Secondary | ICD-10-CM | POA: Diagnosis not present

## 2018-10-03 DIAGNOSIS — R634 Abnormal weight loss: Secondary | ICD-10-CM | POA: Diagnosis not present

## 2018-10-03 DIAGNOSIS — R296 Repeated falls: Secondary | ICD-10-CM | POA: Diagnosis not present

## 2018-10-03 DIAGNOSIS — R451 Restlessness and agitation: Secondary | ICD-10-CM | POA: Diagnosis not present

## 2018-10-04 DIAGNOSIS — I10 Essential (primary) hypertension: Secondary | ICD-10-CM | POA: Diagnosis not present

## 2018-10-04 DIAGNOSIS — Z66 Do not resuscitate: Secondary | ICD-10-CM | POA: Diagnosis not present

## 2018-10-04 DIAGNOSIS — Z515 Encounter for palliative care: Secondary | ICD-10-CM | POA: Diagnosis not present

## 2018-10-04 DIAGNOSIS — R410 Disorientation, unspecified: Secondary | ICD-10-CM | POA: Diagnosis not present

## 2018-10-04 DIAGNOSIS — I779 Disorder of arteries and arterioles, unspecified: Secondary | ICD-10-CM | POA: Diagnosis not present

## 2018-10-04 DIAGNOSIS — R296 Repeated falls: Secondary | ICD-10-CM | POA: Diagnosis not present

## 2018-10-04 DIAGNOSIS — R634 Abnormal weight loss: Secondary | ICD-10-CM | POA: Diagnosis not present

## 2018-10-04 DIAGNOSIS — F0280 Dementia in other diseases classified elsewhere without behavioral disturbance: Secondary | ICD-10-CM | POA: Diagnosis not present

## 2018-10-04 DIAGNOSIS — G300 Alzheimer's disease with early onset: Secondary | ICD-10-CM | POA: Diagnosis not present

## 2018-10-04 DIAGNOSIS — R451 Restlessness and agitation: Secondary | ICD-10-CM | POA: Diagnosis not present

## 2018-10-04 DIAGNOSIS — I6523 Occlusion and stenosis of bilateral carotid arteries: Secondary | ICD-10-CM | POA: Diagnosis not present

## 2018-10-05 DIAGNOSIS — Z515 Encounter for palliative care: Secondary | ICD-10-CM | POA: Diagnosis not present

## 2018-10-05 DIAGNOSIS — I779 Disorder of arteries and arterioles, unspecified: Secondary | ICD-10-CM | POA: Diagnosis not present

## 2018-10-05 DIAGNOSIS — F0280 Dementia in other diseases classified elsewhere without behavioral disturbance: Secondary | ICD-10-CM | POA: Diagnosis not present

## 2018-10-05 DIAGNOSIS — R634 Abnormal weight loss: Secondary | ICD-10-CM | POA: Diagnosis not present

## 2018-10-05 DIAGNOSIS — I6523 Occlusion and stenosis of bilateral carotid arteries: Secondary | ICD-10-CM | POA: Diagnosis not present

## 2018-10-05 DIAGNOSIS — R451 Restlessness and agitation: Secondary | ICD-10-CM | POA: Diagnosis not present

## 2018-10-05 DIAGNOSIS — I10 Essential (primary) hypertension: Secondary | ICD-10-CM | POA: Diagnosis not present

## 2018-10-05 DIAGNOSIS — R410 Disorientation, unspecified: Secondary | ICD-10-CM | POA: Diagnosis not present

## 2018-10-05 DIAGNOSIS — G300 Alzheimer's disease with early onset: Secondary | ICD-10-CM | POA: Diagnosis not present

## 2018-10-05 DIAGNOSIS — R296 Repeated falls: Secondary | ICD-10-CM | POA: Diagnosis not present

## 2018-10-05 DIAGNOSIS — Z66 Do not resuscitate: Secondary | ICD-10-CM | POA: Diagnosis not present

## 2018-10-06 DIAGNOSIS — R451 Restlessness and agitation: Secondary | ICD-10-CM | POA: Diagnosis not present

## 2018-10-06 DIAGNOSIS — F0280 Dementia in other diseases classified elsewhere without behavioral disturbance: Secondary | ICD-10-CM | POA: Diagnosis not present

## 2018-10-06 DIAGNOSIS — I779 Disorder of arteries and arterioles, unspecified: Secondary | ICD-10-CM | POA: Diagnosis not present

## 2018-10-06 DIAGNOSIS — R296 Repeated falls: Secondary | ICD-10-CM | POA: Diagnosis not present

## 2018-10-06 DIAGNOSIS — Z515 Encounter for palliative care: Secondary | ICD-10-CM | POA: Diagnosis not present

## 2018-10-06 DIAGNOSIS — Z66 Do not resuscitate: Secondary | ICD-10-CM | POA: Diagnosis not present

## 2018-10-06 DIAGNOSIS — I6523 Occlusion and stenosis of bilateral carotid arteries: Secondary | ICD-10-CM | POA: Diagnosis not present

## 2018-10-06 DIAGNOSIS — R634 Abnormal weight loss: Secondary | ICD-10-CM | POA: Diagnosis not present

## 2018-10-06 DIAGNOSIS — R410 Disorientation, unspecified: Secondary | ICD-10-CM | POA: Diagnosis not present

## 2018-10-06 DIAGNOSIS — I10 Essential (primary) hypertension: Secondary | ICD-10-CM | POA: Diagnosis not present

## 2018-10-06 DIAGNOSIS — G300 Alzheimer's disease with early onset: Secondary | ICD-10-CM | POA: Diagnosis not present

## 2018-10-07 DIAGNOSIS — I10 Essential (primary) hypertension: Secondary | ICD-10-CM | POA: Diagnosis not present

## 2018-10-07 DIAGNOSIS — F0280 Dementia in other diseases classified elsewhere without behavioral disturbance: Secondary | ICD-10-CM | POA: Diagnosis not present

## 2018-10-07 DIAGNOSIS — Z66 Do not resuscitate: Secondary | ICD-10-CM | POA: Diagnosis not present

## 2018-10-07 DIAGNOSIS — I779 Disorder of arteries and arterioles, unspecified: Secondary | ICD-10-CM | POA: Diagnosis not present

## 2018-10-07 DIAGNOSIS — R296 Repeated falls: Secondary | ICD-10-CM | POA: Diagnosis not present

## 2018-10-07 DIAGNOSIS — Z515 Encounter for palliative care: Secondary | ICD-10-CM | POA: Diagnosis not present

## 2018-10-07 DIAGNOSIS — I6523 Occlusion and stenosis of bilateral carotid arteries: Secondary | ICD-10-CM | POA: Diagnosis not present

## 2018-10-07 DIAGNOSIS — R451 Restlessness and agitation: Secondary | ICD-10-CM | POA: Diagnosis not present

## 2018-10-07 DIAGNOSIS — R410 Disorientation, unspecified: Secondary | ICD-10-CM | POA: Diagnosis not present

## 2018-10-07 DIAGNOSIS — R634 Abnormal weight loss: Secondary | ICD-10-CM | POA: Diagnosis not present

## 2018-10-07 DIAGNOSIS — G300 Alzheimer's disease with early onset: Secondary | ICD-10-CM | POA: Diagnosis not present

## 2018-10-08 DIAGNOSIS — Z515 Encounter for palliative care: Secondary | ICD-10-CM | POA: Diagnosis not present

## 2018-10-08 DIAGNOSIS — Z66 Do not resuscitate: Secondary | ICD-10-CM | POA: Diagnosis not present

## 2018-10-08 DIAGNOSIS — R634 Abnormal weight loss: Secondary | ICD-10-CM | POA: Diagnosis not present

## 2018-10-08 DIAGNOSIS — R296 Repeated falls: Secondary | ICD-10-CM | POA: Diagnosis not present

## 2018-10-08 DIAGNOSIS — R410 Disorientation, unspecified: Secondary | ICD-10-CM | POA: Diagnosis not present

## 2018-10-08 DIAGNOSIS — G300 Alzheimer's disease with early onset: Secondary | ICD-10-CM | POA: Diagnosis not present

## 2018-10-08 DIAGNOSIS — I10 Essential (primary) hypertension: Secondary | ICD-10-CM | POA: Diagnosis not present

## 2018-10-08 DIAGNOSIS — F0280 Dementia in other diseases classified elsewhere without behavioral disturbance: Secondary | ICD-10-CM | POA: Diagnosis not present

## 2018-10-08 DIAGNOSIS — I6523 Occlusion and stenosis of bilateral carotid arteries: Secondary | ICD-10-CM | POA: Diagnosis not present

## 2018-10-08 DIAGNOSIS — I779 Disorder of arteries and arterioles, unspecified: Secondary | ICD-10-CM | POA: Diagnosis not present

## 2018-10-08 DIAGNOSIS — R451 Restlessness and agitation: Secondary | ICD-10-CM | POA: Diagnosis not present

## 2018-10-09 DIAGNOSIS — R451 Restlessness and agitation: Secondary | ICD-10-CM | POA: Diagnosis not present

## 2018-10-09 DIAGNOSIS — Z66 Do not resuscitate: Secondary | ICD-10-CM | POA: Diagnosis not present

## 2018-10-09 DIAGNOSIS — I779 Disorder of arteries and arterioles, unspecified: Secondary | ICD-10-CM | POA: Diagnosis not present

## 2018-10-09 DIAGNOSIS — R634 Abnormal weight loss: Secondary | ICD-10-CM | POA: Diagnosis not present

## 2018-10-09 DIAGNOSIS — F0280 Dementia in other diseases classified elsewhere without behavioral disturbance: Secondary | ICD-10-CM | POA: Diagnosis not present

## 2018-10-09 DIAGNOSIS — R410 Disorientation, unspecified: Secondary | ICD-10-CM | POA: Diagnosis not present

## 2018-10-09 DIAGNOSIS — Z515 Encounter for palliative care: Secondary | ICD-10-CM | POA: Diagnosis not present

## 2018-10-09 DIAGNOSIS — I10 Essential (primary) hypertension: Secondary | ICD-10-CM | POA: Diagnosis not present

## 2018-10-09 DIAGNOSIS — G300 Alzheimer's disease with early onset: Secondary | ICD-10-CM | POA: Diagnosis not present

## 2018-10-09 DIAGNOSIS — R296 Repeated falls: Secondary | ICD-10-CM | POA: Diagnosis not present

## 2018-10-09 DIAGNOSIS — I6523 Occlusion and stenosis of bilateral carotid arteries: Secondary | ICD-10-CM | POA: Diagnosis not present

## 2018-10-10 DIAGNOSIS — R634 Abnormal weight loss: Secondary | ICD-10-CM | POA: Diagnosis not present

## 2018-10-10 DIAGNOSIS — I10 Essential (primary) hypertension: Secondary | ICD-10-CM | POA: Diagnosis not present

## 2018-10-10 DIAGNOSIS — F0280 Dementia in other diseases classified elsewhere without behavioral disturbance: Secondary | ICD-10-CM | POA: Diagnosis not present

## 2018-10-10 DIAGNOSIS — R451 Restlessness and agitation: Secondary | ICD-10-CM | POA: Diagnosis not present

## 2018-10-10 DIAGNOSIS — I779 Disorder of arteries and arterioles, unspecified: Secondary | ICD-10-CM | POA: Diagnosis not present

## 2018-10-10 DIAGNOSIS — Z515 Encounter for palliative care: Secondary | ICD-10-CM | POA: Diagnosis not present

## 2018-10-10 DIAGNOSIS — I6523 Occlusion and stenosis of bilateral carotid arteries: Secondary | ICD-10-CM | POA: Diagnosis not present

## 2018-10-10 DIAGNOSIS — R410 Disorientation, unspecified: Secondary | ICD-10-CM | POA: Diagnosis not present

## 2018-10-10 DIAGNOSIS — Z66 Do not resuscitate: Secondary | ICD-10-CM | POA: Diagnosis not present

## 2018-10-10 DIAGNOSIS — G300 Alzheimer's disease with early onset: Secondary | ICD-10-CM | POA: Diagnosis not present

## 2018-10-10 DIAGNOSIS — R296 Repeated falls: Secondary | ICD-10-CM | POA: Diagnosis not present

## 2018-10-11 DIAGNOSIS — R410 Disorientation, unspecified: Secondary | ICD-10-CM | POA: Diagnosis not present

## 2018-10-11 DIAGNOSIS — R634 Abnormal weight loss: Secondary | ICD-10-CM | POA: Diagnosis not present

## 2018-10-11 DIAGNOSIS — R451 Restlessness and agitation: Secondary | ICD-10-CM | POA: Diagnosis not present

## 2018-10-11 DIAGNOSIS — Z66 Do not resuscitate: Secondary | ICD-10-CM | POA: Diagnosis not present

## 2018-10-11 DIAGNOSIS — I10 Essential (primary) hypertension: Secondary | ICD-10-CM | POA: Diagnosis not present

## 2018-10-11 DIAGNOSIS — F0280 Dementia in other diseases classified elsewhere without behavioral disturbance: Secondary | ICD-10-CM | POA: Diagnosis not present

## 2018-10-11 DIAGNOSIS — R296 Repeated falls: Secondary | ICD-10-CM | POA: Diagnosis not present

## 2018-10-11 DIAGNOSIS — I6523 Occlusion and stenosis of bilateral carotid arteries: Secondary | ICD-10-CM | POA: Diagnosis not present

## 2018-10-11 DIAGNOSIS — I779 Disorder of arteries and arterioles, unspecified: Secondary | ICD-10-CM | POA: Diagnosis not present

## 2018-10-11 DIAGNOSIS — Z515 Encounter for palliative care: Secondary | ICD-10-CM | POA: Diagnosis not present

## 2018-10-11 DIAGNOSIS — G300 Alzheimer's disease with early onset: Secondary | ICD-10-CM | POA: Diagnosis not present

## 2018-10-12 DIAGNOSIS — I6523 Occlusion and stenosis of bilateral carotid arteries: Secondary | ICD-10-CM | POA: Diagnosis not present

## 2018-10-12 DIAGNOSIS — R634 Abnormal weight loss: Secondary | ICD-10-CM | POA: Diagnosis not present

## 2018-10-12 DIAGNOSIS — Z66 Do not resuscitate: Secondary | ICD-10-CM | POA: Diagnosis not present

## 2018-10-12 DIAGNOSIS — I10 Essential (primary) hypertension: Secondary | ICD-10-CM | POA: Diagnosis not present

## 2018-10-12 DIAGNOSIS — G300 Alzheimer's disease with early onset: Secondary | ICD-10-CM | POA: Diagnosis not present

## 2018-10-12 DIAGNOSIS — R410 Disorientation, unspecified: Secondary | ICD-10-CM | POA: Diagnosis not present

## 2018-10-12 DIAGNOSIS — Z515 Encounter for palliative care: Secondary | ICD-10-CM | POA: Diagnosis not present

## 2018-10-12 DIAGNOSIS — F0280 Dementia in other diseases classified elsewhere without behavioral disturbance: Secondary | ICD-10-CM | POA: Diagnosis not present

## 2018-10-12 DIAGNOSIS — I779 Disorder of arteries and arterioles, unspecified: Secondary | ICD-10-CM | POA: Diagnosis not present

## 2018-10-12 DIAGNOSIS — R451 Restlessness and agitation: Secondary | ICD-10-CM | POA: Diagnosis not present

## 2018-10-12 DIAGNOSIS — R296 Repeated falls: Secondary | ICD-10-CM | POA: Diagnosis not present

## 2018-10-13 DIAGNOSIS — F0280 Dementia in other diseases classified elsewhere without behavioral disturbance: Secondary | ICD-10-CM | POA: Diagnosis not present

## 2018-10-13 DIAGNOSIS — I6523 Occlusion and stenosis of bilateral carotid arteries: Secondary | ICD-10-CM | POA: Diagnosis not present

## 2018-10-13 DIAGNOSIS — Z515 Encounter for palliative care: Secondary | ICD-10-CM | POA: Diagnosis not present

## 2018-10-13 DIAGNOSIS — R451 Restlessness and agitation: Secondary | ICD-10-CM | POA: Diagnosis not present

## 2018-10-13 DIAGNOSIS — R296 Repeated falls: Secondary | ICD-10-CM | POA: Diagnosis not present

## 2018-10-13 DIAGNOSIS — Z66 Do not resuscitate: Secondary | ICD-10-CM | POA: Diagnosis not present

## 2018-10-13 DIAGNOSIS — R410 Disorientation, unspecified: Secondary | ICD-10-CM | POA: Diagnosis not present

## 2018-10-13 DIAGNOSIS — I10 Essential (primary) hypertension: Secondary | ICD-10-CM | POA: Diagnosis not present

## 2018-10-13 DIAGNOSIS — G300 Alzheimer's disease with early onset: Secondary | ICD-10-CM | POA: Diagnosis not present

## 2018-10-13 DIAGNOSIS — R634 Abnormal weight loss: Secondary | ICD-10-CM | POA: Diagnosis not present

## 2018-10-13 DIAGNOSIS — I779 Disorder of arteries and arterioles, unspecified: Secondary | ICD-10-CM | POA: Diagnosis not present

## 2018-10-14 DIAGNOSIS — Z66 Do not resuscitate: Secondary | ICD-10-CM | POA: Diagnosis not present

## 2018-10-14 DIAGNOSIS — R410 Disorientation, unspecified: Secondary | ICD-10-CM | POA: Diagnosis not present

## 2018-10-14 DIAGNOSIS — I10 Essential (primary) hypertension: Secondary | ICD-10-CM | POA: Diagnosis not present

## 2018-10-14 DIAGNOSIS — F0280 Dementia in other diseases classified elsewhere without behavioral disturbance: Secondary | ICD-10-CM | POA: Diagnosis not present

## 2018-10-14 DIAGNOSIS — G300 Alzheimer's disease with early onset: Secondary | ICD-10-CM | POA: Diagnosis not present

## 2018-10-14 DIAGNOSIS — R634 Abnormal weight loss: Secondary | ICD-10-CM | POA: Diagnosis not present

## 2018-10-14 DIAGNOSIS — Z515 Encounter for palliative care: Secondary | ICD-10-CM | POA: Diagnosis not present

## 2018-10-14 DIAGNOSIS — I779 Disorder of arteries and arterioles, unspecified: Secondary | ICD-10-CM | POA: Diagnosis not present

## 2018-10-14 DIAGNOSIS — R451 Restlessness and agitation: Secondary | ICD-10-CM | POA: Diagnosis not present

## 2018-10-14 DIAGNOSIS — R296 Repeated falls: Secondary | ICD-10-CM | POA: Diagnosis not present

## 2018-10-14 DIAGNOSIS — I6523 Occlusion and stenosis of bilateral carotid arteries: Secondary | ICD-10-CM | POA: Diagnosis not present

## 2018-10-15 DIAGNOSIS — I779 Disorder of arteries and arterioles, unspecified: Secondary | ICD-10-CM | POA: Diagnosis not present

## 2018-10-15 DIAGNOSIS — R296 Repeated falls: Secondary | ICD-10-CM | POA: Diagnosis not present

## 2018-10-15 DIAGNOSIS — I10 Essential (primary) hypertension: Secondary | ICD-10-CM | POA: Diagnosis not present

## 2018-10-15 DIAGNOSIS — F0280 Dementia in other diseases classified elsewhere without behavioral disturbance: Secondary | ICD-10-CM | POA: Diagnosis not present

## 2018-10-15 DIAGNOSIS — Z515 Encounter for palliative care: Secondary | ICD-10-CM | POA: Diagnosis not present

## 2018-10-15 DIAGNOSIS — R634 Abnormal weight loss: Secondary | ICD-10-CM | POA: Diagnosis not present

## 2018-10-15 DIAGNOSIS — Z66 Do not resuscitate: Secondary | ICD-10-CM | POA: Diagnosis not present

## 2018-10-15 DIAGNOSIS — R451 Restlessness and agitation: Secondary | ICD-10-CM | POA: Diagnosis not present

## 2018-10-15 DIAGNOSIS — G300 Alzheimer's disease with early onset: Secondary | ICD-10-CM | POA: Diagnosis not present

## 2018-10-15 DIAGNOSIS — I6523 Occlusion and stenosis of bilateral carotid arteries: Secondary | ICD-10-CM | POA: Diagnosis not present

## 2018-10-15 DIAGNOSIS — R410 Disorientation, unspecified: Secondary | ICD-10-CM | POA: Diagnosis not present

## 2018-10-16 DIAGNOSIS — G300 Alzheimer's disease with early onset: Secondary | ICD-10-CM | POA: Diagnosis not present

## 2018-10-16 DIAGNOSIS — R410 Disorientation, unspecified: Secondary | ICD-10-CM | POA: Diagnosis not present

## 2018-10-16 DIAGNOSIS — R634 Abnormal weight loss: Secondary | ICD-10-CM | POA: Diagnosis not present

## 2018-10-16 DIAGNOSIS — R451 Restlessness and agitation: Secondary | ICD-10-CM | POA: Diagnosis not present

## 2018-10-16 DIAGNOSIS — R296 Repeated falls: Secondary | ICD-10-CM | POA: Diagnosis not present

## 2018-10-16 DIAGNOSIS — I6523 Occlusion and stenosis of bilateral carotid arteries: Secondary | ICD-10-CM | POA: Diagnosis not present

## 2018-10-16 DIAGNOSIS — I779 Disorder of arteries and arterioles, unspecified: Secondary | ICD-10-CM | POA: Diagnosis not present

## 2018-10-16 DIAGNOSIS — Z515 Encounter for palliative care: Secondary | ICD-10-CM | POA: Diagnosis not present

## 2018-10-16 DIAGNOSIS — Z66 Do not resuscitate: Secondary | ICD-10-CM | POA: Diagnosis not present

## 2018-10-16 DIAGNOSIS — I10 Essential (primary) hypertension: Secondary | ICD-10-CM | POA: Diagnosis not present

## 2018-10-16 DIAGNOSIS — F0280 Dementia in other diseases classified elsewhere without behavioral disturbance: Secondary | ICD-10-CM | POA: Diagnosis not present

## 2018-10-17 DIAGNOSIS — R296 Repeated falls: Secondary | ICD-10-CM | POA: Diagnosis not present

## 2018-10-17 DIAGNOSIS — R634 Abnormal weight loss: Secondary | ICD-10-CM | POA: Diagnosis not present

## 2018-10-17 DIAGNOSIS — I10 Essential (primary) hypertension: Secondary | ICD-10-CM | POA: Diagnosis not present

## 2018-10-17 DIAGNOSIS — R410 Disorientation, unspecified: Secondary | ICD-10-CM | POA: Diagnosis not present

## 2018-10-17 DIAGNOSIS — I6523 Occlusion and stenosis of bilateral carotid arteries: Secondary | ICD-10-CM | POA: Diagnosis not present

## 2018-10-17 DIAGNOSIS — Z515 Encounter for palliative care: Secondary | ICD-10-CM | POA: Diagnosis not present

## 2018-10-17 DIAGNOSIS — I779 Disorder of arteries and arterioles, unspecified: Secondary | ICD-10-CM | POA: Diagnosis not present

## 2018-10-17 DIAGNOSIS — F0280 Dementia in other diseases classified elsewhere without behavioral disturbance: Secondary | ICD-10-CM | POA: Diagnosis not present

## 2018-10-17 DIAGNOSIS — R451 Restlessness and agitation: Secondary | ICD-10-CM | POA: Diagnosis not present

## 2018-10-17 DIAGNOSIS — Z66 Do not resuscitate: Secondary | ICD-10-CM | POA: Diagnosis not present

## 2018-10-17 DIAGNOSIS — G300 Alzheimer's disease with early onset: Secondary | ICD-10-CM | POA: Diagnosis not present

## 2018-10-18 DIAGNOSIS — G300 Alzheimer's disease with early onset: Secondary | ICD-10-CM | POA: Diagnosis not present

## 2018-10-18 DIAGNOSIS — I6523 Occlusion and stenosis of bilateral carotid arteries: Secondary | ICD-10-CM | POA: Diagnosis not present

## 2018-10-18 DIAGNOSIS — I779 Disorder of arteries and arterioles, unspecified: Secondary | ICD-10-CM | POA: Diagnosis not present

## 2018-10-18 DIAGNOSIS — R451 Restlessness and agitation: Secondary | ICD-10-CM | POA: Diagnosis not present

## 2018-10-18 DIAGNOSIS — F0280 Dementia in other diseases classified elsewhere without behavioral disturbance: Secondary | ICD-10-CM | POA: Diagnosis not present

## 2018-10-18 DIAGNOSIS — I10 Essential (primary) hypertension: Secondary | ICD-10-CM | POA: Diagnosis not present

## 2018-10-18 DIAGNOSIS — R296 Repeated falls: Secondary | ICD-10-CM | POA: Diagnosis not present

## 2018-10-18 DIAGNOSIS — R410 Disorientation, unspecified: Secondary | ICD-10-CM | POA: Diagnosis not present

## 2018-10-18 DIAGNOSIS — Z66 Do not resuscitate: Secondary | ICD-10-CM | POA: Diagnosis not present

## 2018-10-18 DIAGNOSIS — Z515 Encounter for palliative care: Secondary | ICD-10-CM | POA: Diagnosis not present

## 2018-10-18 DIAGNOSIS — R634 Abnormal weight loss: Secondary | ICD-10-CM | POA: Diagnosis not present

## 2018-10-19 DIAGNOSIS — I10 Essential (primary) hypertension: Secondary | ICD-10-CM | POA: Diagnosis not present

## 2018-10-19 DIAGNOSIS — Z515 Encounter for palliative care: Secondary | ICD-10-CM | POA: Diagnosis not present

## 2018-10-19 DIAGNOSIS — R451 Restlessness and agitation: Secondary | ICD-10-CM | POA: Diagnosis not present

## 2018-10-19 DIAGNOSIS — Z66 Do not resuscitate: Secondary | ICD-10-CM | POA: Diagnosis not present

## 2018-10-19 DIAGNOSIS — R296 Repeated falls: Secondary | ICD-10-CM | POA: Diagnosis not present

## 2018-10-19 DIAGNOSIS — R634 Abnormal weight loss: Secondary | ICD-10-CM | POA: Diagnosis not present

## 2018-10-19 DIAGNOSIS — G300 Alzheimer's disease with early onset: Secondary | ICD-10-CM | POA: Diagnosis not present

## 2018-10-19 DIAGNOSIS — F0280 Dementia in other diseases classified elsewhere without behavioral disturbance: Secondary | ICD-10-CM | POA: Diagnosis not present

## 2018-10-19 DIAGNOSIS — I6523 Occlusion and stenosis of bilateral carotid arteries: Secondary | ICD-10-CM | POA: Diagnosis not present

## 2018-10-19 DIAGNOSIS — R410 Disorientation, unspecified: Secondary | ICD-10-CM | POA: Diagnosis not present

## 2018-10-19 DIAGNOSIS — I779 Disorder of arteries and arterioles, unspecified: Secondary | ICD-10-CM | POA: Diagnosis not present

## 2018-10-20 DIAGNOSIS — Z66 Do not resuscitate: Secondary | ICD-10-CM | POA: Diagnosis not present

## 2018-10-20 DIAGNOSIS — R410 Disorientation, unspecified: Secondary | ICD-10-CM | POA: Diagnosis not present

## 2018-10-20 DIAGNOSIS — R634 Abnormal weight loss: Secondary | ICD-10-CM | POA: Diagnosis not present

## 2018-10-20 DIAGNOSIS — F0280 Dementia in other diseases classified elsewhere without behavioral disturbance: Secondary | ICD-10-CM | POA: Diagnosis not present

## 2018-10-20 DIAGNOSIS — Z515 Encounter for palliative care: Secondary | ICD-10-CM | POA: Diagnosis not present

## 2018-10-20 DIAGNOSIS — R451 Restlessness and agitation: Secondary | ICD-10-CM | POA: Diagnosis not present

## 2018-10-20 DIAGNOSIS — G300 Alzheimer's disease with early onset: Secondary | ICD-10-CM | POA: Diagnosis not present

## 2018-10-20 DIAGNOSIS — R296 Repeated falls: Secondary | ICD-10-CM | POA: Diagnosis not present

## 2018-10-20 DIAGNOSIS — I6523 Occlusion and stenosis of bilateral carotid arteries: Secondary | ICD-10-CM | POA: Diagnosis not present

## 2018-10-20 DIAGNOSIS — I779 Disorder of arteries and arterioles, unspecified: Secondary | ICD-10-CM | POA: Diagnosis not present

## 2018-10-20 DIAGNOSIS — I10 Essential (primary) hypertension: Secondary | ICD-10-CM | POA: Diagnosis not present

## 2018-10-21 DIAGNOSIS — I10 Essential (primary) hypertension: Secondary | ICD-10-CM | POA: Diagnosis not present

## 2018-10-21 DIAGNOSIS — R296 Repeated falls: Secondary | ICD-10-CM | POA: Diagnosis not present

## 2018-10-21 DIAGNOSIS — Z66 Do not resuscitate: Secondary | ICD-10-CM | POA: Diagnosis not present

## 2018-10-21 DIAGNOSIS — Z515 Encounter for palliative care: Secondary | ICD-10-CM | POA: Diagnosis not present

## 2018-10-21 DIAGNOSIS — F0280 Dementia in other diseases classified elsewhere without behavioral disturbance: Secondary | ICD-10-CM | POA: Diagnosis not present

## 2018-10-21 DIAGNOSIS — R451 Restlessness and agitation: Secondary | ICD-10-CM | POA: Diagnosis not present

## 2018-10-21 DIAGNOSIS — R634 Abnormal weight loss: Secondary | ICD-10-CM | POA: Diagnosis not present

## 2018-10-21 DIAGNOSIS — I6523 Occlusion and stenosis of bilateral carotid arteries: Secondary | ICD-10-CM | POA: Diagnosis not present

## 2018-10-21 DIAGNOSIS — I779 Disorder of arteries and arterioles, unspecified: Secondary | ICD-10-CM | POA: Diagnosis not present

## 2018-10-21 DIAGNOSIS — G300 Alzheimer's disease with early onset: Secondary | ICD-10-CM | POA: Diagnosis not present

## 2018-10-21 DIAGNOSIS — R410 Disorientation, unspecified: Secondary | ICD-10-CM | POA: Diagnosis not present

## 2018-10-22 DIAGNOSIS — R451 Restlessness and agitation: Secondary | ICD-10-CM | POA: Diagnosis not present

## 2018-10-22 DIAGNOSIS — I10 Essential (primary) hypertension: Secondary | ICD-10-CM | POA: Diagnosis not present

## 2018-10-22 DIAGNOSIS — Z515 Encounter for palliative care: Secondary | ICD-10-CM | POA: Diagnosis not present

## 2018-10-22 DIAGNOSIS — I6523 Occlusion and stenosis of bilateral carotid arteries: Secondary | ICD-10-CM | POA: Diagnosis not present

## 2018-10-22 DIAGNOSIS — G300 Alzheimer's disease with early onset: Secondary | ICD-10-CM | POA: Diagnosis not present

## 2018-10-22 DIAGNOSIS — R634 Abnormal weight loss: Secondary | ICD-10-CM | POA: Diagnosis not present

## 2018-10-22 DIAGNOSIS — Z66 Do not resuscitate: Secondary | ICD-10-CM | POA: Diagnosis not present

## 2018-10-22 DIAGNOSIS — R296 Repeated falls: Secondary | ICD-10-CM | POA: Diagnosis not present

## 2018-10-22 DIAGNOSIS — F0280 Dementia in other diseases classified elsewhere without behavioral disturbance: Secondary | ICD-10-CM | POA: Diagnosis not present

## 2018-10-22 DIAGNOSIS — I779 Disorder of arteries and arterioles, unspecified: Secondary | ICD-10-CM | POA: Diagnosis not present

## 2018-10-22 DIAGNOSIS — R410 Disorientation, unspecified: Secondary | ICD-10-CM | POA: Diagnosis not present

## 2018-10-23 DIAGNOSIS — R451 Restlessness and agitation: Secondary | ICD-10-CM | POA: Diagnosis not present

## 2018-10-23 DIAGNOSIS — F0280 Dementia in other diseases classified elsewhere without behavioral disturbance: Secondary | ICD-10-CM | POA: Diagnosis not present

## 2018-10-23 DIAGNOSIS — Z515 Encounter for palliative care: Secondary | ICD-10-CM | POA: Diagnosis not present

## 2018-10-23 DIAGNOSIS — G300 Alzheimer's disease with early onset: Secondary | ICD-10-CM | POA: Diagnosis not present

## 2018-10-23 DIAGNOSIS — R410 Disorientation, unspecified: Secondary | ICD-10-CM | POA: Diagnosis not present

## 2018-10-23 DIAGNOSIS — R634 Abnormal weight loss: Secondary | ICD-10-CM | POA: Diagnosis not present

## 2018-10-23 DIAGNOSIS — I779 Disorder of arteries and arterioles, unspecified: Secondary | ICD-10-CM | POA: Diagnosis not present

## 2018-10-23 DIAGNOSIS — Z66 Do not resuscitate: Secondary | ICD-10-CM | POA: Diagnosis not present

## 2018-10-23 DIAGNOSIS — I10 Essential (primary) hypertension: Secondary | ICD-10-CM | POA: Diagnosis not present

## 2018-10-23 DIAGNOSIS — I6523 Occlusion and stenosis of bilateral carotid arteries: Secondary | ICD-10-CM | POA: Diagnosis not present

## 2018-10-23 DIAGNOSIS — R296 Repeated falls: Secondary | ICD-10-CM | POA: Diagnosis not present

## 2018-10-24 DIAGNOSIS — Z66 Do not resuscitate: Secondary | ICD-10-CM | POA: Diagnosis not present

## 2018-10-24 DIAGNOSIS — I779 Disorder of arteries and arterioles, unspecified: Secondary | ICD-10-CM | POA: Diagnosis not present

## 2018-10-24 DIAGNOSIS — G300 Alzheimer's disease with early onset: Secondary | ICD-10-CM | POA: Diagnosis not present

## 2018-10-24 DIAGNOSIS — I6523 Occlusion and stenosis of bilateral carotid arteries: Secondary | ICD-10-CM | POA: Diagnosis not present

## 2018-10-24 DIAGNOSIS — I10 Essential (primary) hypertension: Secondary | ICD-10-CM | POA: Diagnosis not present

## 2018-10-24 DIAGNOSIS — Z515 Encounter for palliative care: Secondary | ICD-10-CM | POA: Diagnosis not present

## 2018-10-24 DIAGNOSIS — R451 Restlessness and agitation: Secondary | ICD-10-CM | POA: Diagnosis not present

## 2018-10-24 DIAGNOSIS — R296 Repeated falls: Secondary | ICD-10-CM | POA: Diagnosis not present

## 2018-10-24 DIAGNOSIS — F0280 Dementia in other diseases classified elsewhere without behavioral disturbance: Secondary | ICD-10-CM | POA: Diagnosis not present

## 2018-10-24 DIAGNOSIS — R410 Disorientation, unspecified: Secondary | ICD-10-CM | POA: Diagnosis not present

## 2018-10-24 DIAGNOSIS — R634 Abnormal weight loss: Secondary | ICD-10-CM | POA: Diagnosis not present

## 2018-10-25 DIAGNOSIS — R451 Restlessness and agitation: Secondary | ICD-10-CM | POA: Diagnosis not present

## 2018-10-25 DIAGNOSIS — I6523 Occlusion and stenosis of bilateral carotid arteries: Secondary | ICD-10-CM | POA: Diagnosis not present

## 2018-10-25 DIAGNOSIS — G300 Alzheimer's disease with early onset: Secondary | ICD-10-CM | POA: Diagnosis not present

## 2018-10-25 DIAGNOSIS — I779 Disorder of arteries and arterioles, unspecified: Secondary | ICD-10-CM | POA: Diagnosis not present

## 2018-10-25 DIAGNOSIS — Z515 Encounter for palliative care: Secondary | ICD-10-CM | POA: Diagnosis not present

## 2018-10-25 DIAGNOSIS — I10 Essential (primary) hypertension: Secondary | ICD-10-CM | POA: Diagnosis not present

## 2018-10-25 DIAGNOSIS — R634 Abnormal weight loss: Secondary | ICD-10-CM | POA: Diagnosis not present

## 2018-10-25 DIAGNOSIS — R296 Repeated falls: Secondary | ICD-10-CM | POA: Diagnosis not present

## 2018-10-25 DIAGNOSIS — Z66 Do not resuscitate: Secondary | ICD-10-CM | POA: Diagnosis not present

## 2018-10-25 DIAGNOSIS — F0280 Dementia in other diseases classified elsewhere without behavioral disturbance: Secondary | ICD-10-CM | POA: Diagnosis not present

## 2018-10-25 DIAGNOSIS — R410 Disorientation, unspecified: Secondary | ICD-10-CM | POA: Diagnosis not present

## 2018-10-26 DIAGNOSIS — R634 Abnormal weight loss: Secondary | ICD-10-CM | POA: Diagnosis not present

## 2018-10-26 DIAGNOSIS — I6523 Occlusion and stenosis of bilateral carotid arteries: Secondary | ICD-10-CM | POA: Diagnosis not present

## 2018-10-26 DIAGNOSIS — R296 Repeated falls: Secondary | ICD-10-CM | POA: Diagnosis not present

## 2018-10-26 DIAGNOSIS — I10 Essential (primary) hypertension: Secondary | ICD-10-CM | POA: Diagnosis not present

## 2018-10-26 DIAGNOSIS — F0280 Dementia in other diseases classified elsewhere without behavioral disturbance: Secondary | ICD-10-CM | POA: Diagnosis not present

## 2018-10-26 DIAGNOSIS — I779 Disorder of arteries and arterioles, unspecified: Secondary | ICD-10-CM | POA: Diagnosis not present

## 2018-10-26 DIAGNOSIS — Z515 Encounter for palliative care: Secondary | ICD-10-CM | POA: Diagnosis not present

## 2018-10-26 DIAGNOSIS — R451 Restlessness and agitation: Secondary | ICD-10-CM | POA: Diagnosis not present

## 2018-10-26 DIAGNOSIS — Z66 Do not resuscitate: Secondary | ICD-10-CM | POA: Diagnosis not present

## 2018-10-26 DIAGNOSIS — R410 Disorientation, unspecified: Secondary | ICD-10-CM | POA: Diagnosis not present

## 2018-10-26 DIAGNOSIS — G300 Alzheimer's disease with early onset: Secondary | ICD-10-CM | POA: Diagnosis not present

## 2018-10-27 DIAGNOSIS — I779 Disorder of arteries and arterioles, unspecified: Secondary | ICD-10-CM | POA: Diagnosis not present

## 2018-10-27 DIAGNOSIS — R410 Disorientation, unspecified: Secondary | ICD-10-CM | POA: Diagnosis not present

## 2018-10-27 DIAGNOSIS — R451 Restlessness and agitation: Secondary | ICD-10-CM | POA: Diagnosis not present

## 2018-10-27 DIAGNOSIS — R634 Abnormal weight loss: Secondary | ICD-10-CM | POA: Diagnosis not present

## 2018-10-27 DIAGNOSIS — I6523 Occlusion and stenosis of bilateral carotid arteries: Secondary | ICD-10-CM | POA: Diagnosis not present

## 2018-10-27 DIAGNOSIS — Z66 Do not resuscitate: Secondary | ICD-10-CM | POA: Diagnosis not present

## 2018-10-27 DIAGNOSIS — G300 Alzheimer's disease with early onset: Secondary | ICD-10-CM | POA: Diagnosis not present

## 2018-10-27 DIAGNOSIS — Z515 Encounter for palliative care: Secondary | ICD-10-CM | POA: Diagnosis not present

## 2018-10-27 DIAGNOSIS — F0280 Dementia in other diseases classified elsewhere without behavioral disturbance: Secondary | ICD-10-CM | POA: Diagnosis not present

## 2018-10-27 DIAGNOSIS — R296 Repeated falls: Secondary | ICD-10-CM | POA: Diagnosis not present

## 2018-10-27 DIAGNOSIS — I10 Essential (primary) hypertension: Secondary | ICD-10-CM | POA: Diagnosis not present

## 2018-10-28 DIAGNOSIS — F0280 Dementia in other diseases classified elsewhere without behavioral disturbance: Secondary | ICD-10-CM | POA: Diagnosis not present

## 2018-10-28 DIAGNOSIS — I10 Essential (primary) hypertension: Secondary | ICD-10-CM | POA: Diagnosis not present

## 2018-10-28 DIAGNOSIS — I779 Disorder of arteries and arterioles, unspecified: Secondary | ICD-10-CM | POA: Diagnosis not present

## 2018-10-28 DIAGNOSIS — R296 Repeated falls: Secondary | ICD-10-CM | POA: Diagnosis not present

## 2018-10-28 DIAGNOSIS — G300 Alzheimer's disease with early onset: Secondary | ICD-10-CM | POA: Diagnosis not present

## 2018-10-28 DIAGNOSIS — Z66 Do not resuscitate: Secondary | ICD-10-CM | POA: Diagnosis not present

## 2018-10-28 DIAGNOSIS — I6523 Occlusion and stenosis of bilateral carotid arteries: Secondary | ICD-10-CM | POA: Diagnosis not present

## 2018-10-28 DIAGNOSIS — R634 Abnormal weight loss: Secondary | ICD-10-CM | POA: Diagnosis not present

## 2018-10-28 DIAGNOSIS — R410 Disorientation, unspecified: Secondary | ICD-10-CM | POA: Diagnosis not present

## 2018-10-28 DIAGNOSIS — R451 Restlessness and agitation: Secondary | ICD-10-CM | POA: Diagnosis not present

## 2018-10-28 DIAGNOSIS — Z515 Encounter for palliative care: Secondary | ICD-10-CM | POA: Diagnosis not present

## 2018-10-29 DIAGNOSIS — I779 Disorder of arteries and arterioles, unspecified: Secondary | ICD-10-CM | POA: Diagnosis not present

## 2018-10-29 DIAGNOSIS — Z66 Do not resuscitate: Secondary | ICD-10-CM | POA: Diagnosis not present

## 2018-10-29 DIAGNOSIS — G300 Alzheimer's disease with early onset: Secondary | ICD-10-CM | POA: Diagnosis not present

## 2018-10-29 DIAGNOSIS — I10 Essential (primary) hypertension: Secondary | ICD-10-CM | POA: Diagnosis not present

## 2018-10-29 DIAGNOSIS — I6523 Occlusion and stenosis of bilateral carotid arteries: Secondary | ICD-10-CM | POA: Diagnosis not present

## 2018-10-29 DIAGNOSIS — R296 Repeated falls: Secondary | ICD-10-CM | POA: Diagnosis not present

## 2018-10-29 DIAGNOSIS — R410 Disorientation, unspecified: Secondary | ICD-10-CM | POA: Diagnosis not present

## 2018-10-29 DIAGNOSIS — R451 Restlessness and agitation: Secondary | ICD-10-CM | POA: Diagnosis not present

## 2018-10-29 DIAGNOSIS — R634 Abnormal weight loss: Secondary | ICD-10-CM | POA: Diagnosis not present

## 2018-10-29 DIAGNOSIS — Z515 Encounter for palliative care: Secondary | ICD-10-CM | POA: Diagnosis not present

## 2018-10-29 DIAGNOSIS — F0280 Dementia in other diseases classified elsewhere without behavioral disturbance: Secondary | ICD-10-CM | POA: Diagnosis not present

## 2018-10-30 DIAGNOSIS — I10 Essential (primary) hypertension: Secondary | ICD-10-CM | POA: Diagnosis not present

## 2018-10-30 DIAGNOSIS — G300 Alzheimer's disease with early onset: Secondary | ICD-10-CM | POA: Diagnosis not present

## 2018-10-30 DIAGNOSIS — Z66 Do not resuscitate: Secondary | ICD-10-CM | POA: Diagnosis not present

## 2018-10-30 DIAGNOSIS — R410 Disorientation, unspecified: Secondary | ICD-10-CM | POA: Diagnosis not present

## 2018-10-30 DIAGNOSIS — R451 Restlessness and agitation: Secondary | ICD-10-CM | POA: Diagnosis not present

## 2018-10-30 DIAGNOSIS — R634 Abnormal weight loss: Secondary | ICD-10-CM | POA: Diagnosis not present

## 2018-10-30 DIAGNOSIS — I779 Disorder of arteries and arterioles, unspecified: Secondary | ICD-10-CM | POA: Diagnosis not present

## 2018-10-30 DIAGNOSIS — Z515 Encounter for palliative care: Secondary | ICD-10-CM | POA: Diagnosis not present

## 2018-10-30 DIAGNOSIS — I6523 Occlusion and stenosis of bilateral carotid arteries: Secondary | ICD-10-CM | POA: Diagnosis not present

## 2018-10-30 DIAGNOSIS — R296 Repeated falls: Secondary | ICD-10-CM | POA: Diagnosis not present

## 2018-10-30 DIAGNOSIS — F0280 Dementia in other diseases classified elsewhere without behavioral disturbance: Secondary | ICD-10-CM | POA: Diagnosis not present

## 2018-10-31 DIAGNOSIS — Z515 Encounter for palliative care: Secondary | ICD-10-CM | POA: Diagnosis not present

## 2018-10-31 DIAGNOSIS — G300 Alzheimer's disease with early onset: Secondary | ICD-10-CM | POA: Diagnosis not present

## 2018-10-31 DIAGNOSIS — R451 Restlessness and agitation: Secondary | ICD-10-CM | POA: Diagnosis not present

## 2018-10-31 DIAGNOSIS — F0280 Dementia in other diseases classified elsewhere without behavioral disturbance: Secondary | ICD-10-CM | POA: Diagnosis not present

## 2018-10-31 DIAGNOSIS — R296 Repeated falls: Secondary | ICD-10-CM | POA: Diagnosis not present

## 2018-10-31 DIAGNOSIS — Z66 Do not resuscitate: Secondary | ICD-10-CM | POA: Diagnosis not present

## 2018-10-31 DIAGNOSIS — I10 Essential (primary) hypertension: Secondary | ICD-10-CM | POA: Diagnosis not present

## 2018-10-31 DIAGNOSIS — I779 Disorder of arteries and arterioles, unspecified: Secondary | ICD-10-CM | POA: Diagnosis not present

## 2018-10-31 DIAGNOSIS — I6523 Occlusion and stenosis of bilateral carotid arteries: Secondary | ICD-10-CM | POA: Diagnosis not present

## 2018-10-31 DIAGNOSIS — R410 Disorientation, unspecified: Secondary | ICD-10-CM | POA: Diagnosis not present

## 2018-10-31 DIAGNOSIS — R634 Abnormal weight loss: Secondary | ICD-10-CM | POA: Diagnosis not present

## 2018-11-01 DIAGNOSIS — R451 Restlessness and agitation: Secondary | ICD-10-CM | POA: Diagnosis not present

## 2018-11-01 DIAGNOSIS — I6523 Occlusion and stenosis of bilateral carotid arteries: Secondary | ICD-10-CM | POA: Diagnosis not present

## 2018-11-01 DIAGNOSIS — R410 Disorientation, unspecified: Secondary | ICD-10-CM | POA: Diagnosis not present

## 2018-11-01 DIAGNOSIS — I779 Disorder of arteries and arterioles, unspecified: Secondary | ICD-10-CM | POA: Diagnosis not present

## 2018-11-01 DIAGNOSIS — Z515 Encounter for palliative care: Secondary | ICD-10-CM | POA: Diagnosis not present

## 2018-11-01 DIAGNOSIS — I10 Essential (primary) hypertension: Secondary | ICD-10-CM | POA: Diagnosis not present

## 2018-11-01 DIAGNOSIS — G300 Alzheimer's disease with early onset: Secondary | ICD-10-CM | POA: Diagnosis not present

## 2018-11-01 DIAGNOSIS — R634 Abnormal weight loss: Secondary | ICD-10-CM | POA: Diagnosis not present

## 2018-11-01 DIAGNOSIS — F0280 Dementia in other diseases classified elsewhere without behavioral disturbance: Secondary | ICD-10-CM | POA: Diagnosis not present

## 2018-11-01 DIAGNOSIS — Z66 Do not resuscitate: Secondary | ICD-10-CM | POA: Diagnosis not present

## 2018-11-01 DIAGNOSIS — R296 Repeated falls: Secondary | ICD-10-CM | POA: Diagnosis not present

## 2018-11-02 DIAGNOSIS — R410 Disorientation, unspecified: Secondary | ICD-10-CM | POA: Diagnosis not present

## 2018-11-02 DIAGNOSIS — Z66 Do not resuscitate: Secondary | ICD-10-CM | POA: Diagnosis not present

## 2018-11-02 DIAGNOSIS — R634 Abnormal weight loss: Secondary | ICD-10-CM | POA: Diagnosis not present

## 2018-11-02 DIAGNOSIS — R296 Repeated falls: Secondary | ICD-10-CM | POA: Diagnosis not present

## 2018-11-02 DIAGNOSIS — I779 Disorder of arteries and arterioles, unspecified: Secondary | ICD-10-CM | POA: Diagnosis not present

## 2018-11-02 DIAGNOSIS — Z515 Encounter for palliative care: Secondary | ICD-10-CM | POA: Diagnosis not present

## 2018-11-02 DIAGNOSIS — I10 Essential (primary) hypertension: Secondary | ICD-10-CM | POA: Diagnosis not present

## 2018-11-02 DIAGNOSIS — F0280 Dementia in other diseases classified elsewhere without behavioral disturbance: Secondary | ICD-10-CM | POA: Diagnosis not present

## 2018-11-02 DIAGNOSIS — I6523 Occlusion and stenosis of bilateral carotid arteries: Secondary | ICD-10-CM | POA: Diagnosis not present

## 2018-11-02 DIAGNOSIS — R451 Restlessness and agitation: Secondary | ICD-10-CM | POA: Diagnosis not present

## 2018-11-02 DIAGNOSIS — G300 Alzheimer's disease with early onset: Secondary | ICD-10-CM | POA: Diagnosis not present

## 2018-11-03 DIAGNOSIS — R410 Disorientation, unspecified: Secondary | ICD-10-CM | POA: Diagnosis not present

## 2018-11-03 DIAGNOSIS — R451 Restlessness and agitation: Secondary | ICD-10-CM | POA: Diagnosis not present

## 2018-11-03 DIAGNOSIS — I10 Essential (primary) hypertension: Secondary | ICD-10-CM | POA: Diagnosis not present

## 2018-11-03 DIAGNOSIS — R296 Repeated falls: Secondary | ICD-10-CM | POA: Diagnosis not present

## 2018-11-03 DIAGNOSIS — I6523 Occlusion and stenosis of bilateral carotid arteries: Secondary | ICD-10-CM | POA: Diagnosis not present

## 2018-11-03 DIAGNOSIS — I779 Disorder of arteries and arterioles, unspecified: Secondary | ICD-10-CM | POA: Diagnosis not present

## 2018-11-03 DIAGNOSIS — G300 Alzheimer's disease with early onset: Secondary | ICD-10-CM | POA: Diagnosis not present

## 2018-11-03 DIAGNOSIS — Z515 Encounter for palliative care: Secondary | ICD-10-CM | POA: Diagnosis not present

## 2018-11-03 DIAGNOSIS — F0280 Dementia in other diseases classified elsewhere without behavioral disturbance: Secondary | ICD-10-CM | POA: Diagnosis not present

## 2018-11-03 DIAGNOSIS — R634 Abnormal weight loss: Secondary | ICD-10-CM | POA: Diagnosis not present

## 2018-11-03 DIAGNOSIS — Z66 Do not resuscitate: Secondary | ICD-10-CM | POA: Diagnosis not present

## 2018-11-04 DIAGNOSIS — Z66 Do not resuscitate: Secondary | ICD-10-CM | POA: Diagnosis not present

## 2018-11-04 DIAGNOSIS — R410 Disorientation, unspecified: Secondary | ICD-10-CM | POA: Diagnosis not present

## 2018-11-04 DIAGNOSIS — R634 Abnormal weight loss: Secondary | ICD-10-CM | POA: Diagnosis not present

## 2018-11-04 DIAGNOSIS — I10 Essential (primary) hypertension: Secondary | ICD-10-CM | POA: Diagnosis not present

## 2018-11-04 DIAGNOSIS — I779 Disorder of arteries and arterioles, unspecified: Secondary | ICD-10-CM | POA: Diagnosis not present

## 2018-11-04 DIAGNOSIS — I6523 Occlusion and stenosis of bilateral carotid arteries: Secondary | ICD-10-CM | POA: Diagnosis not present

## 2018-11-04 DIAGNOSIS — F0280 Dementia in other diseases classified elsewhere without behavioral disturbance: Secondary | ICD-10-CM | POA: Diagnosis not present

## 2018-11-04 DIAGNOSIS — R451 Restlessness and agitation: Secondary | ICD-10-CM | POA: Diagnosis not present

## 2018-11-04 DIAGNOSIS — R296 Repeated falls: Secondary | ICD-10-CM | POA: Diagnosis not present

## 2018-11-04 DIAGNOSIS — Z515 Encounter for palliative care: Secondary | ICD-10-CM | POA: Diagnosis not present

## 2018-11-04 DIAGNOSIS — G300 Alzheimer's disease with early onset: Secondary | ICD-10-CM | POA: Diagnosis not present

## 2018-11-05 DIAGNOSIS — Z66 Do not resuscitate: Secondary | ICD-10-CM | POA: Diagnosis not present

## 2018-11-05 DIAGNOSIS — F0280 Dementia in other diseases classified elsewhere without behavioral disturbance: Secondary | ICD-10-CM | POA: Diagnosis not present

## 2018-11-05 DIAGNOSIS — R634 Abnormal weight loss: Secondary | ICD-10-CM | POA: Diagnosis not present

## 2018-11-05 DIAGNOSIS — G300 Alzheimer's disease with early onset: Secondary | ICD-10-CM | POA: Diagnosis not present

## 2018-11-05 DIAGNOSIS — R451 Restlessness and agitation: Secondary | ICD-10-CM | POA: Diagnosis not present

## 2018-11-05 DIAGNOSIS — I6523 Occlusion and stenosis of bilateral carotid arteries: Secondary | ICD-10-CM | POA: Diagnosis not present

## 2018-11-05 DIAGNOSIS — Z515 Encounter for palliative care: Secondary | ICD-10-CM | POA: Diagnosis not present

## 2018-11-05 DIAGNOSIS — R410 Disorientation, unspecified: Secondary | ICD-10-CM | POA: Diagnosis not present

## 2018-11-05 DIAGNOSIS — I779 Disorder of arteries and arterioles, unspecified: Secondary | ICD-10-CM | POA: Diagnosis not present

## 2018-11-05 DIAGNOSIS — I10 Essential (primary) hypertension: Secondary | ICD-10-CM | POA: Diagnosis not present

## 2018-11-05 DIAGNOSIS — R296 Repeated falls: Secondary | ICD-10-CM | POA: Diagnosis not present

## 2018-11-06 DIAGNOSIS — Z66 Do not resuscitate: Secondary | ICD-10-CM | POA: Diagnosis not present

## 2018-11-06 DIAGNOSIS — R451 Restlessness and agitation: Secondary | ICD-10-CM | POA: Diagnosis not present

## 2018-11-06 DIAGNOSIS — R410 Disorientation, unspecified: Secondary | ICD-10-CM | POA: Diagnosis not present

## 2018-11-06 DIAGNOSIS — I779 Disorder of arteries and arterioles, unspecified: Secondary | ICD-10-CM | POA: Diagnosis not present

## 2018-11-06 DIAGNOSIS — G300 Alzheimer's disease with early onset: Secondary | ICD-10-CM | POA: Diagnosis not present

## 2018-11-06 DIAGNOSIS — R627 Adult failure to thrive: Secondary | ICD-10-CM | POA: Diagnosis not present

## 2018-11-06 DIAGNOSIS — Z515 Encounter for palliative care: Secondary | ICD-10-CM | POA: Diagnosis not present

## 2018-11-06 DIAGNOSIS — F0280 Dementia in other diseases classified elsewhere without behavioral disturbance: Secondary | ICD-10-CM | POA: Diagnosis not present

## 2018-11-06 DIAGNOSIS — R296 Repeated falls: Secondary | ICD-10-CM | POA: Diagnosis not present

## 2018-11-06 DIAGNOSIS — I10 Essential (primary) hypertension: Secondary | ICD-10-CM | POA: Diagnosis not present

## 2018-11-06 DIAGNOSIS — R634 Abnormal weight loss: Secondary | ICD-10-CM | POA: Diagnosis not present

## 2018-11-06 DIAGNOSIS — I639 Cerebral infarction, unspecified: Secondary | ICD-10-CM | POA: Diagnosis not present

## 2018-11-06 DIAGNOSIS — I6523 Occlusion and stenosis of bilateral carotid arteries: Secondary | ICD-10-CM | POA: Diagnosis not present

## 2018-11-06 DIAGNOSIS — R54 Age-related physical debility: Secondary | ICD-10-CM | POA: Diagnosis not present

## 2018-11-07 DIAGNOSIS — I779 Disorder of arteries and arterioles, unspecified: Secondary | ICD-10-CM | POA: Diagnosis not present

## 2018-11-07 DIAGNOSIS — I10 Essential (primary) hypertension: Secondary | ICD-10-CM | POA: Diagnosis not present

## 2018-11-07 DIAGNOSIS — Z66 Do not resuscitate: Secondary | ICD-10-CM | POA: Diagnosis not present

## 2018-11-07 DIAGNOSIS — R296 Repeated falls: Secondary | ICD-10-CM | POA: Diagnosis not present

## 2018-11-07 DIAGNOSIS — R634 Abnormal weight loss: Secondary | ICD-10-CM | POA: Diagnosis not present

## 2018-11-07 DIAGNOSIS — R451 Restlessness and agitation: Secondary | ICD-10-CM | POA: Diagnosis not present

## 2018-11-07 DIAGNOSIS — I6523 Occlusion and stenosis of bilateral carotid arteries: Secondary | ICD-10-CM | POA: Diagnosis not present

## 2018-11-07 DIAGNOSIS — F0280 Dementia in other diseases classified elsewhere without behavioral disturbance: Secondary | ICD-10-CM | POA: Diagnosis not present

## 2018-11-07 DIAGNOSIS — R410 Disorientation, unspecified: Secondary | ICD-10-CM | POA: Diagnosis not present

## 2018-11-07 DIAGNOSIS — G300 Alzheimer's disease with early onset: Secondary | ICD-10-CM | POA: Diagnosis not present

## 2018-11-07 DIAGNOSIS — Z515 Encounter for palliative care: Secondary | ICD-10-CM | POA: Diagnosis not present

## 2018-11-08 DIAGNOSIS — Z66 Do not resuscitate: Secondary | ICD-10-CM | POA: Diagnosis not present

## 2018-11-08 DIAGNOSIS — I6523 Occlusion and stenosis of bilateral carotid arteries: Secondary | ICD-10-CM | POA: Diagnosis not present

## 2018-11-08 DIAGNOSIS — R451 Restlessness and agitation: Secondary | ICD-10-CM | POA: Diagnosis not present

## 2018-11-08 DIAGNOSIS — R296 Repeated falls: Secondary | ICD-10-CM | POA: Diagnosis not present

## 2018-11-08 DIAGNOSIS — F0280 Dementia in other diseases classified elsewhere without behavioral disturbance: Secondary | ICD-10-CM | POA: Diagnosis not present

## 2018-11-08 DIAGNOSIS — R634 Abnormal weight loss: Secondary | ICD-10-CM | POA: Diagnosis not present

## 2018-11-08 DIAGNOSIS — R410 Disorientation, unspecified: Secondary | ICD-10-CM | POA: Diagnosis not present

## 2018-11-08 DIAGNOSIS — Z515 Encounter for palliative care: Secondary | ICD-10-CM | POA: Diagnosis not present

## 2018-11-08 DIAGNOSIS — G300 Alzheimer's disease with early onset: Secondary | ICD-10-CM | POA: Diagnosis not present

## 2018-11-08 DIAGNOSIS — I779 Disorder of arteries and arterioles, unspecified: Secondary | ICD-10-CM | POA: Diagnosis not present

## 2018-11-08 DIAGNOSIS — I10 Essential (primary) hypertension: Secondary | ICD-10-CM | POA: Diagnosis not present

## 2018-11-09 DIAGNOSIS — I6523 Occlusion and stenosis of bilateral carotid arteries: Secondary | ICD-10-CM | POA: Diagnosis not present

## 2018-11-09 DIAGNOSIS — G300 Alzheimer's disease with early onset: Secondary | ICD-10-CM | POA: Diagnosis not present

## 2018-11-09 DIAGNOSIS — I10 Essential (primary) hypertension: Secondary | ICD-10-CM | POA: Diagnosis not present

## 2018-11-09 DIAGNOSIS — Z515 Encounter for palliative care: Secondary | ICD-10-CM | POA: Diagnosis not present

## 2018-11-09 DIAGNOSIS — Z66 Do not resuscitate: Secondary | ICD-10-CM | POA: Diagnosis not present

## 2018-11-09 DIAGNOSIS — R296 Repeated falls: Secondary | ICD-10-CM | POA: Diagnosis not present

## 2018-11-09 DIAGNOSIS — R634 Abnormal weight loss: Secondary | ICD-10-CM | POA: Diagnosis not present

## 2018-11-09 DIAGNOSIS — R451 Restlessness and agitation: Secondary | ICD-10-CM | POA: Diagnosis not present

## 2018-11-09 DIAGNOSIS — F0280 Dementia in other diseases classified elsewhere without behavioral disturbance: Secondary | ICD-10-CM | POA: Diagnosis not present

## 2018-11-09 DIAGNOSIS — I779 Disorder of arteries and arterioles, unspecified: Secondary | ICD-10-CM | POA: Diagnosis not present

## 2018-11-09 DIAGNOSIS — R410 Disorientation, unspecified: Secondary | ICD-10-CM | POA: Diagnosis not present

## 2018-11-10 DIAGNOSIS — R451 Restlessness and agitation: Secondary | ICD-10-CM | POA: Diagnosis not present

## 2018-11-10 DIAGNOSIS — F0280 Dementia in other diseases classified elsewhere without behavioral disturbance: Secondary | ICD-10-CM | POA: Diagnosis not present

## 2018-11-10 DIAGNOSIS — I10 Essential (primary) hypertension: Secondary | ICD-10-CM | POA: Diagnosis not present

## 2018-11-10 DIAGNOSIS — Z66 Do not resuscitate: Secondary | ICD-10-CM | POA: Diagnosis not present

## 2018-11-10 DIAGNOSIS — R296 Repeated falls: Secondary | ICD-10-CM | POA: Diagnosis not present

## 2018-11-10 DIAGNOSIS — I6523 Occlusion and stenosis of bilateral carotid arteries: Secondary | ICD-10-CM | POA: Diagnosis not present

## 2018-11-10 DIAGNOSIS — R634 Abnormal weight loss: Secondary | ICD-10-CM | POA: Diagnosis not present

## 2018-11-10 DIAGNOSIS — I779 Disorder of arteries and arterioles, unspecified: Secondary | ICD-10-CM | POA: Diagnosis not present

## 2018-11-10 DIAGNOSIS — Z515 Encounter for palliative care: Secondary | ICD-10-CM | POA: Diagnosis not present

## 2018-11-10 DIAGNOSIS — R410 Disorientation, unspecified: Secondary | ICD-10-CM | POA: Diagnosis not present

## 2018-11-10 DIAGNOSIS — G300 Alzheimer's disease with early onset: Secondary | ICD-10-CM | POA: Diagnosis not present

## 2018-11-11 DIAGNOSIS — Z66 Do not resuscitate: Secondary | ICD-10-CM | POA: Diagnosis not present

## 2018-11-11 DIAGNOSIS — R634 Abnormal weight loss: Secondary | ICD-10-CM | POA: Diagnosis not present

## 2018-11-11 DIAGNOSIS — R410 Disorientation, unspecified: Secondary | ICD-10-CM | POA: Diagnosis not present

## 2018-11-11 DIAGNOSIS — Z515 Encounter for palliative care: Secondary | ICD-10-CM | POA: Diagnosis not present

## 2018-11-11 DIAGNOSIS — I6523 Occlusion and stenosis of bilateral carotid arteries: Secondary | ICD-10-CM | POA: Diagnosis not present

## 2018-11-11 DIAGNOSIS — I10 Essential (primary) hypertension: Secondary | ICD-10-CM | POA: Diagnosis not present

## 2018-11-11 DIAGNOSIS — R451 Restlessness and agitation: Secondary | ICD-10-CM | POA: Diagnosis not present

## 2018-11-11 DIAGNOSIS — R296 Repeated falls: Secondary | ICD-10-CM | POA: Diagnosis not present

## 2018-11-11 DIAGNOSIS — F0280 Dementia in other diseases classified elsewhere without behavioral disturbance: Secondary | ICD-10-CM | POA: Diagnosis not present

## 2018-11-11 DIAGNOSIS — I779 Disorder of arteries and arterioles, unspecified: Secondary | ICD-10-CM | POA: Diagnosis not present

## 2018-11-11 DIAGNOSIS — G300 Alzheimer's disease with early onset: Secondary | ICD-10-CM | POA: Diagnosis not present

## 2018-11-12 DIAGNOSIS — G300 Alzheimer's disease with early onset: Secondary | ICD-10-CM | POA: Diagnosis not present

## 2018-11-12 DIAGNOSIS — Z515 Encounter for palliative care: Secondary | ICD-10-CM | POA: Diagnosis not present

## 2018-11-12 DIAGNOSIS — I779 Disorder of arteries and arterioles, unspecified: Secondary | ICD-10-CM | POA: Diagnosis not present

## 2018-11-12 DIAGNOSIS — R451 Restlessness and agitation: Secondary | ICD-10-CM | POA: Diagnosis not present

## 2018-11-12 DIAGNOSIS — L603 Nail dystrophy: Secondary | ICD-10-CM | POA: Diagnosis not present

## 2018-11-12 DIAGNOSIS — R410 Disorientation, unspecified: Secondary | ICD-10-CM | POA: Diagnosis not present

## 2018-11-12 DIAGNOSIS — R634 Abnormal weight loss: Secondary | ICD-10-CM | POA: Diagnosis not present

## 2018-11-12 DIAGNOSIS — Q845 Enlarged and hypertrophic nails: Secondary | ICD-10-CM | POA: Diagnosis not present

## 2018-11-12 DIAGNOSIS — Z66 Do not resuscitate: Secondary | ICD-10-CM | POA: Diagnosis not present

## 2018-11-12 DIAGNOSIS — I739 Peripheral vascular disease, unspecified: Secondary | ICD-10-CM | POA: Diagnosis not present

## 2018-11-12 DIAGNOSIS — F0280 Dementia in other diseases classified elsewhere without behavioral disturbance: Secondary | ICD-10-CM | POA: Diagnosis not present

## 2018-11-12 DIAGNOSIS — R296 Repeated falls: Secondary | ICD-10-CM | POA: Diagnosis not present

## 2018-11-12 DIAGNOSIS — I10 Essential (primary) hypertension: Secondary | ICD-10-CM | POA: Diagnosis not present

## 2018-11-12 DIAGNOSIS — B351 Tinea unguium: Secondary | ICD-10-CM | POA: Diagnosis not present

## 2018-11-12 DIAGNOSIS — I6523 Occlusion and stenosis of bilateral carotid arteries: Secondary | ICD-10-CM | POA: Diagnosis not present

## 2018-11-13 DIAGNOSIS — Z515 Encounter for palliative care: Secondary | ICD-10-CM | POA: Diagnosis not present

## 2018-11-13 DIAGNOSIS — I779 Disorder of arteries and arterioles, unspecified: Secondary | ICD-10-CM | POA: Diagnosis not present

## 2018-11-13 DIAGNOSIS — R451 Restlessness and agitation: Secondary | ICD-10-CM | POA: Diagnosis not present

## 2018-11-13 DIAGNOSIS — I10 Essential (primary) hypertension: Secondary | ICD-10-CM | POA: Diagnosis not present

## 2018-11-13 DIAGNOSIS — I6523 Occlusion and stenosis of bilateral carotid arteries: Secondary | ICD-10-CM | POA: Diagnosis not present

## 2018-11-13 DIAGNOSIS — R410 Disorientation, unspecified: Secondary | ICD-10-CM | POA: Diagnosis not present

## 2018-11-13 DIAGNOSIS — F0280 Dementia in other diseases classified elsewhere without behavioral disturbance: Secondary | ICD-10-CM | POA: Diagnosis not present

## 2018-11-13 DIAGNOSIS — R634 Abnormal weight loss: Secondary | ICD-10-CM | POA: Diagnosis not present

## 2018-11-13 DIAGNOSIS — Z66 Do not resuscitate: Secondary | ICD-10-CM | POA: Diagnosis not present

## 2018-11-13 DIAGNOSIS — R296 Repeated falls: Secondary | ICD-10-CM | POA: Diagnosis not present

## 2018-11-13 DIAGNOSIS — G300 Alzheimer's disease with early onset: Secondary | ICD-10-CM | POA: Diagnosis not present

## 2018-11-14 DIAGNOSIS — Z515 Encounter for palliative care: Secondary | ICD-10-CM | POA: Diagnosis not present

## 2018-11-14 DIAGNOSIS — F0280 Dementia in other diseases classified elsewhere without behavioral disturbance: Secondary | ICD-10-CM | POA: Diagnosis not present

## 2018-11-14 DIAGNOSIS — R451 Restlessness and agitation: Secondary | ICD-10-CM | POA: Diagnosis not present

## 2018-11-14 DIAGNOSIS — I6523 Occlusion and stenosis of bilateral carotid arteries: Secondary | ICD-10-CM | POA: Diagnosis not present

## 2018-11-14 DIAGNOSIS — Z66 Do not resuscitate: Secondary | ICD-10-CM | POA: Diagnosis not present

## 2018-11-14 DIAGNOSIS — I779 Disorder of arteries and arterioles, unspecified: Secondary | ICD-10-CM | POA: Diagnosis not present

## 2018-11-14 DIAGNOSIS — R410 Disorientation, unspecified: Secondary | ICD-10-CM | POA: Diagnosis not present

## 2018-11-14 DIAGNOSIS — R634 Abnormal weight loss: Secondary | ICD-10-CM | POA: Diagnosis not present

## 2018-11-14 DIAGNOSIS — R296 Repeated falls: Secondary | ICD-10-CM | POA: Diagnosis not present

## 2018-11-14 DIAGNOSIS — I10 Essential (primary) hypertension: Secondary | ICD-10-CM | POA: Diagnosis not present

## 2018-11-14 DIAGNOSIS — G300 Alzheimer's disease with early onset: Secondary | ICD-10-CM | POA: Diagnosis not present

## 2018-11-15 DIAGNOSIS — R410 Disorientation, unspecified: Secondary | ICD-10-CM | POA: Diagnosis not present

## 2018-11-15 DIAGNOSIS — R451 Restlessness and agitation: Secondary | ICD-10-CM | POA: Diagnosis not present

## 2018-11-15 DIAGNOSIS — R634 Abnormal weight loss: Secondary | ICD-10-CM | POA: Diagnosis not present

## 2018-11-15 DIAGNOSIS — G300 Alzheimer's disease with early onset: Secondary | ICD-10-CM | POA: Diagnosis not present

## 2018-11-15 DIAGNOSIS — I779 Disorder of arteries and arterioles, unspecified: Secondary | ICD-10-CM | POA: Diagnosis not present

## 2018-11-15 DIAGNOSIS — R296 Repeated falls: Secondary | ICD-10-CM | POA: Diagnosis not present

## 2018-11-15 DIAGNOSIS — Z66 Do not resuscitate: Secondary | ICD-10-CM | POA: Diagnosis not present

## 2018-11-15 DIAGNOSIS — I6523 Occlusion and stenosis of bilateral carotid arteries: Secondary | ICD-10-CM | POA: Diagnosis not present

## 2018-11-15 DIAGNOSIS — F0280 Dementia in other diseases classified elsewhere without behavioral disturbance: Secondary | ICD-10-CM | POA: Diagnosis not present

## 2018-11-15 DIAGNOSIS — I10 Essential (primary) hypertension: Secondary | ICD-10-CM | POA: Diagnosis not present

## 2018-11-15 DIAGNOSIS — Z515 Encounter for palliative care: Secondary | ICD-10-CM | POA: Diagnosis not present

## 2018-11-16 DIAGNOSIS — I6523 Occlusion and stenosis of bilateral carotid arteries: Secondary | ICD-10-CM | POA: Diagnosis not present

## 2018-11-16 DIAGNOSIS — R451 Restlessness and agitation: Secondary | ICD-10-CM | POA: Diagnosis not present

## 2018-11-16 DIAGNOSIS — G300 Alzheimer's disease with early onset: Secondary | ICD-10-CM | POA: Diagnosis not present

## 2018-11-16 DIAGNOSIS — Z515 Encounter for palliative care: Secondary | ICD-10-CM | POA: Diagnosis not present

## 2018-11-16 DIAGNOSIS — R296 Repeated falls: Secondary | ICD-10-CM | POA: Diagnosis not present

## 2018-11-16 DIAGNOSIS — R634 Abnormal weight loss: Secondary | ICD-10-CM | POA: Diagnosis not present

## 2018-11-16 DIAGNOSIS — R410 Disorientation, unspecified: Secondary | ICD-10-CM | POA: Diagnosis not present

## 2018-11-16 DIAGNOSIS — I779 Disorder of arteries and arterioles, unspecified: Secondary | ICD-10-CM | POA: Diagnosis not present

## 2018-11-16 DIAGNOSIS — I10 Essential (primary) hypertension: Secondary | ICD-10-CM | POA: Diagnosis not present

## 2018-11-16 DIAGNOSIS — F0280 Dementia in other diseases classified elsewhere without behavioral disturbance: Secondary | ICD-10-CM | POA: Diagnosis not present

## 2018-11-16 DIAGNOSIS — Z66 Do not resuscitate: Secondary | ICD-10-CM | POA: Diagnosis not present

## 2018-11-17 DIAGNOSIS — I10 Essential (primary) hypertension: Secondary | ICD-10-CM | POA: Diagnosis not present

## 2018-11-17 DIAGNOSIS — I779 Disorder of arteries and arterioles, unspecified: Secondary | ICD-10-CM | POA: Diagnosis not present

## 2018-11-17 DIAGNOSIS — R296 Repeated falls: Secondary | ICD-10-CM | POA: Diagnosis not present

## 2018-11-17 DIAGNOSIS — G300 Alzheimer's disease with early onset: Secondary | ICD-10-CM | POA: Diagnosis not present

## 2018-11-17 DIAGNOSIS — Z66 Do not resuscitate: Secondary | ICD-10-CM | POA: Diagnosis not present

## 2018-11-17 DIAGNOSIS — I6523 Occlusion and stenosis of bilateral carotid arteries: Secondary | ICD-10-CM | POA: Diagnosis not present

## 2018-11-17 DIAGNOSIS — R451 Restlessness and agitation: Secondary | ICD-10-CM | POA: Diagnosis not present

## 2018-11-17 DIAGNOSIS — R634 Abnormal weight loss: Secondary | ICD-10-CM | POA: Diagnosis not present

## 2018-11-17 DIAGNOSIS — R410 Disorientation, unspecified: Secondary | ICD-10-CM | POA: Diagnosis not present

## 2018-11-17 DIAGNOSIS — Z515 Encounter for palliative care: Secondary | ICD-10-CM | POA: Diagnosis not present

## 2018-11-17 DIAGNOSIS — F0280 Dementia in other diseases classified elsewhere without behavioral disturbance: Secondary | ICD-10-CM | POA: Diagnosis not present

## 2018-11-18 DIAGNOSIS — R634 Abnormal weight loss: Secondary | ICD-10-CM | POA: Diagnosis not present

## 2018-11-18 DIAGNOSIS — Z66 Do not resuscitate: Secondary | ICD-10-CM | POA: Diagnosis not present

## 2018-11-18 DIAGNOSIS — I10 Essential (primary) hypertension: Secondary | ICD-10-CM | POA: Diagnosis not present

## 2018-11-18 DIAGNOSIS — Z515 Encounter for palliative care: Secondary | ICD-10-CM | POA: Diagnosis not present

## 2018-11-18 DIAGNOSIS — R410 Disorientation, unspecified: Secondary | ICD-10-CM | POA: Diagnosis not present

## 2018-11-18 DIAGNOSIS — G300 Alzheimer's disease with early onset: Secondary | ICD-10-CM | POA: Diagnosis not present

## 2018-11-18 DIAGNOSIS — I779 Disorder of arteries and arterioles, unspecified: Secondary | ICD-10-CM | POA: Diagnosis not present

## 2018-11-18 DIAGNOSIS — I6523 Occlusion and stenosis of bilateral carotid arteries: Secondary | ICD-10-CM | POA: Diagnosis not present

## 2018-11-18 DIAGNOSIS — R451 Restlessness and agitation: Secondary | ICD-10-CM | POA: Diagnosis not present

## 2018-11-18 DIAGNOSIS — R296 Repeated falls: Secondary | ICD-10-CM | POA: Diagnosis not present

## 2018-11-18 DIAGNOSIS — F0280 Dementia in other diseases classified elsewhere without behavioral disturbance: Secondary | ICD-10-CM | POA: Diagnosis not present

## 2018-11-19 DIAGNOSIS — I779 Disorder of arteries and arterioles, unspecified: Secondary | ICD-10-CM | POA: Diagnosis not present

## 2018-11-19 DIAGNOSIS — I6523 Occlusion and stenosis of bilateral carotid arteries: Secondary | ICD-10-CM | POA: Diagnosis not present

## 2018-11-19 DIAGNOSIS — F0150 Vascular dementia without behavioral disturbance: Secondary | ICD-10-CM | POA: Diagnosis not present

## 2018-11-19 DIAGNOSIS — Z66 Do not resuscitate: Secondary | ICD-10-CM | POA: Diagnosis not present

## 2018-11-19 DIAGNOSIS — F0280 Dementia in other diseases classified elsewhere without behavioral disturbance: Secondary | ICD-10-CM | POA: Diagnosis not present

## 2018-11-19 DIAGNOSIS — Z515 Encounter for palliative care: Secondary | ICD-10-CM | POA: Diagnosis not present

## 2018-11-19 DIAGNOSIS — G300 Alzheimer's disease with early onset: Secondary | ICD-10-CM | POA: Diagnosis not present

## 2018-11-19 DIAGNOSIS — R410 Disorientation, unspecified: Secondary | ICD-10-CM | POA: Diagnosis not present

## 2018-11-19 DIAGNOSIS — R296 Repeated falls: Secondary | ICD-10-CM | POA: Diagnosis not present

## 2018-11-19 DIAGNOSIS — F419 Anxiety disorder, unspecified: Secondary | ICD-10-CM | POA: Diagnosis not present

## 2018-11-19 DIAGNOSIS — R451 Restlessness and agitation: Secondary | ICD-10-CM | POA: Diagnosis not present

## 2018-11-19 DIAGNOSIS — I10 Essential (primary) hypertension: Secondary | ICD-10-CM | POA: Diagnosis not present

## 2018-11-19 DIAGNOSIS — R634 Abnormal weight loss: Secondary | ICD-10-CM | POA: Diagnosis not present

## 2018-11-20 DIAGNOSIS — I10 Essential (primary) hypertension: Secondary | ICD-10-CM | POA: Diagnosis not present

## 2018-11-20 DIAGNOSIS — G300 Alzheimer's disease with early onset: Secondary | ICD-10-CM | POA: Diagnosis not present

## 2018-11-20 DIAGNOSIS — I779 Disorder of arteries and arterioles, unspecified: Secondary | ICD-10-CM | POA: Diagnosis not present

## 2018-11-20 DIAGNOSIS — R451 Restlessness and agitation: Secondary | ICD-10-CM | POA: Diagnosis not present

## 2018-11-20 DIAGNOSIS — Z515 Encounter for palliative care: Secondary | ICD-10-CM | POA: Diagnosis not present

## 2018-11-20 DIAGNOSIS — R296 Repeated falls: Secondary | ICD-10-CM | POA: Diagnosis not present

## 2018-11-20 DIAGNOSIS — Z66 Do not resuscitate: Secondary | ICD-10-CM | POA: Diagnosis not present

## 2018-11-20 DIAGNOSIS — F0280 Dementia in other diseases classified elsewhere without behavioral disturbance: Secondary | ICD-10-CM | POA: Diagnosis not present

## 2018-11-20 DIAGNOSIS — R634 Abnormal weight loss: Secondary | ICD-10-CM | POA: Diagnosis not present

## 2018-11-20 DIAGNOSIS — I6523 Occlusion and stenosis of bilateral carotid arteries: Secondary | ICD-10-CM | POA: Diagnosis not present

## 2018-11-20 DIAGNOSIS — R410 Disorientation, unspecified: Secondary | ICD-10-CM | POA: Diagnosis not present

## 2018-11-21 DIAGNOSIS — G300 Alzheimer's disease with early onset: Secondary | ICD-10-CM | POA: Diagnosis not present

## 2018-11-21 DIAGNOSIS — I779 Disorder of arteries and arterioles, unspecified: Secondary | ICD-10-CM | POA: Diagnosis not present

## 2018-11-21 DIAGNOSIS — R296 Repeated falls: Secondary | ICD-10-CM | POA: Diagnosis not present

## 2018-11-21 DIAGNOSIS — Z515 Encounter for palliative care: Secondary | ICD-10-CM | POA: Diagnosis not present

## 2018-11-21 DIAGNOSIS — I6523 Occlusion and stenosis of bilateral carotid arteries: Secondary | ICD-10-CM | POA: Diagnosis not present

## 2018-11-21 DIAGNOSIS — I10 Essential (primary) hypertension: Secondary | ICD-10-CM | POA: Diagnosis not present

## 2018-11-21 DIAGNOSIS — R410 Disorientation, unspecified: Secondary | ICD-10-CM | POA: Diagnosis not present

## 2018-11-21 DIAGNOSIS — R634 Abnormal weight loss: Secondary | ICD-10-CM | POA: Diagnosis not present

## 2018-11-21 DIAGNOSIS — F0280 Dementia in other diseases classified elsewhere without behavioral disturbance: Secondary | ICD-10-CM | POA: Diagnosis not present

## 2018-11-21 DIAGNOSIS — Z66 Do not resuscitate: Secondary | ICD-10-CM | POA: Diagnosis not present

## 2018-11-21 DIAGNOSIS — R451 Restlessness and agitation: Secondary | ICD-10-CM | POA: Diagnosis not present

## 2018-11-22 DIAGNOSIS — I10 Essential (primary) hypertension: Secondary | ICD-10-CM | POA: Diagnosis not present

## 2018-11-22 DIAGNOSIS — I6523 Occlusion and stenosis of bilateral carotid arteries: Secondary | ICD-10-CM | POA: Diagnosis not present

## 2018-11-22 DIAGNOSIS — R296 Repeated falls: Secondary | ICD-10-CM | POA: Diagnosis not present

## 2018-11-22 DIAGNOSIS — I779 Disorder of arteries and arterioles, unspecified: Secondary | ICD-10-CM | POA: Diagnosis not present

## 2018-11-22 DIAGNOSIS — Z66 Do not resuscitate: Secondary | ICD-10-CM | POA: Diagnosis not present

## 2018-11-22 DIAGNOSIS — R410 Disorientation, unspecified: Secondary | ICD-10-CM | POA: Diagnosis not present

## 2018-11-22 DIAGNOSIS — Z515 Encounter for palliative care: Secondary | ICD-10-CM | POA: Diagnosis not present

## 2018-11-22 DIAGNOSIS — R451 Restlessness and agitation: Secondary | ICD-10-CM | POA: Diagnosis not present

## 2018-11-22 DIAGNOSIS — G300 Alzheimer's disease with early onset: Secondary | ICD-10-CM | POA: Diagnosis not present

## 2018-11-22 DIAGNOSIS — R634 Abnormal weight loss: Secondary | ICD-10-CM | POA: Diagnosis not present

## 2018-11-22 DIAGNOSIS — F0280 Dementia in other diseases classified elsewhere without behavioral disturbance: Secondary | ICD-10-CM | POA: Diagnosis not present

## 2018-11-23 DIAGNOSIS — I10 Essential (primary) hypertension: Secondary | ICD-10-CM | POA: Diagnosis not present

## 2018-11-23 DIAGNOSIS — R296 Repeated falls: Secondary | ICD-10-CM | POA: Diagnosis not present

## 2018-11-23 DIAGNOSIS — R634 Abnormal weight loss: Secondary | ICD-10-CM | POA: Diagnosis not present

## 2018-11-23 DIAGNOSIS — F0280 Dementia in other diseases classified elsewhere without behavioral disturbance: Secondary | ICD-10-CM | POA: Diagnosis not present

## 2018-11-23 DIAGNOSIS — Z66 Do not resuscitate: Secondary | ICD-10-CM | POA: Diagnosis not present

## 2018-11-23 DIAGNOSIS — R451 Restlessness and agitation: Secondary | ICD-10-CM | POA: Diagnosis not present

## 2018-11-23 DIAGNOSIS — Z515 Encounter for palliative care: Secondary | ICD-10-CM | POA: Diagnosis not present

## 2018-11-23 DIAGNOSIS — I6523 Occlusion and stenosis of bilateral carotid arteries: Secondary | ICD-10-CM | POA: Diagnosis not present

## 2018-11-23 DIAGNOSIS — R410 Disorientation, unspecified: Secondary | ICD-10-CM | POA: Diagnosis not present

## 2018-11-23 DIAGNOSIS — G300 Alzheimer's disease with early onset: Secondary | ICD-10-CM | POA: Diagnosis not present

## 2018-11-23 DIAGNOSIS — I779 Disorder of arteries and arterioles, unspecified: Secondary | ICD-10-CM | POA: Diagnosis not present

## 2018-11-24 DIAGNOSIS — Z515 Encounter for palliative care: Secondary | ICD-10-CM | POA: Diagnosis not present

## 2018-11-24 DIAGNOSIS — F0280 Dementia in other diseases classified elsewhere without behavioral disturbance: Secondary | ICD-10-CM | POA: Diagnosis not present

## 2018-11-24 DIAGNOSIS — R410 Disorientation, unspecified: Secondary | ICD-10-CM | POA: Diagnosis not present

## 2018-11-24 DIAGNOSIS — I779 Disorder of arteries and arterioles, unspecified: Secondary | ICD-10-CM | POA: Diagnosis not present

## 2018-11-24 DIAGNOSIS — R451 Restlessness and agitation: Secondary | ICD-10-CM | POA: Diagnosis not present

## 2018-11-24 DIAGNOSIS — I10 Essential (primary) hypertension: Secondary | ICD-10-CM | POA: Diagnosis not present

## 2018-11-24 DIAGNOSIS — Z66 Do not resuscitate: Secondary | ICD-10-CM | POA: Diagnosis not present

## 2018-11-24 DIAGNOSIS — R296 Repeated falls: Secondary | ICD-10-CM | POA: Diagnosis not present

## 2018-11-24 DIAGNOSIS — G300 Alzheimer's disease with early onset: Secondary | ICD-10-CM | POA: Diagnosis not present

## 2018-11-24 DIAGNOSIS — I6523 Occlusion and stenosis of bilateral carotid arteries: Secondary | ICD-10-CM | POA: Diagnosis not present

## 2018-11-24 DIAGNOSIS — R634 Abnormal weight loss: Secondary | ICD-10-CM | POA: Diagnosis not present

## 2018-11-25 DIAGNOSIS — G300 Alzheimer's disease with early onset: Secondary | ICD-10-CM | POA: Diagnosis not present

## 2018-11-25 DIAGNOSIS — R410 Disorientation, unspecified: Secondary | ICD-10-CM | POA: Diagnosis not present

## 2018-11-25 DIAGNOSIS — Z515 Encounter for palliative care: Secondary | ICD-10-CM | POA: Diagnosis not present

## 2018-11-25 DIAGNOSIS — R451 Restlessness and agitation: Secondary | ICD-10-CM | POA: Diagnosis not present

## 2018-11-25 DIAGNOSIS — I6523 Occlusion and stenosis of bilateral carotid arteries: Secondary | ICD-10-CM | POA: Diagnosis not present

## 2018-11-25 DIAGNOSIS — R634 Abnormal weight loss: Secondary | ICD-10-CM | POA: Diagnosis not present

## 2018-11-25 DIAGNOSIS — F0280 Dementia in other diseases classified elsewhere without behavioral disturbance: Secondary | ICD-10-CM | POA: Diagnosis not present

## 2018-11-25 DIAGNOSIS — R296 Repeated falls: Secondary | ICD-10-CM | POA: Diagnosis not present

## 2018-11-25 DIAGNOSIS — Z66 Do not resuscitate: Secondary | ICD-10-CM | POA: Diagnosis not present

## 2018-11-25 DIAGNOSIS — I779 Disorder of arteries and arterioles, unspecified: Secondary | ICD-10-CM | POA: Diagnosis not present

## 2018-11-25 DIAGNOSIS — I10 Essential (primary) hypertension: Secondary | ICD-10-CM | POA: Diagnosis not present

## 2018-11-26 DIAGNOSIS — R410 Disorientation, unspecified: Secondary | ICD-10-CM | POA: Diagnosis not present

## 2018-11-26 DIAGNOSIS — I10 Essential (primary) hypertension: Secondary | ICD-10-CM | POA: Diagnosis not present

## 2018-11-26 DIAGNOSIS — Z66 Do not resuscitate: Secondary | ICD-10-CM | POA: Diagnosis not present

## 2018-11-26 DIAGNOSIS — I6523 Occlusion and stenosis of bilateral carotid arteries: Secondary | ICD-10-CM | POA: Diagnosis not present

## 2018-11-26 DIAGNOSIS — R451 Restlessness and agitation: Secondary | ICD-10-CM | POA: Diagnosis not present

## 2018-11-26 DIAGNOSIS — I779 Disorder of arteries and arterioles, unspecified: Secondary | ICD-10-CM | POA: Diagnosis not present

## 2018-11-26 DIAGNOSIS — R634 Abnormal weight loss: Secondary | ICD-10-CM | POA: Diagnosis not present

## 2018-11-26 DIAGNOSIS — F0280 Dementia in other diseases classified elsewhere without behavioral disturbance: Secondary | ICD-10-CM | POA: Diagnosis not present

## 2018-11-26 DIAGNOSIS — Z515 Encounter for palliative care: Secondary | ICD-10-CM | POA: Diagnosis not present

## 2018-11-26 DIAGNOSIS — R296 Repeated falls: Secondary | ICD-10-CM | POA: Diagnosis not present

## 2018-11-26 DIAGNOSIS — G300 Alzheimer's disease with early onset: Secondary | ICD-10-CM | POA: Diagnosis not present

## 2018-11-27 DIAGNOSIS — I779 Disorder of arteries and arterioles, unspecified: Secondary | ICD-10-CM | POA: Diagnosis not present

## 2018-11-27 DIAGNOSIS — I10 Essential (primary) hypertension: Secondary | ICD-10-CM | POA: Diagnosis not present

## 2018-11-27 DIAGNOSIS — G300 Alzheimer's disease with early onset: Secondary | ICD-10-CM | POA: Diagnosis not present

## 2018-11-27 DIAGNOSIS — Z66 Do not resuscitate: Secondary | ICD-10-CM | POA: Diagnosis not present

## 2018-11-27 DIAGNOSIS — F0280 Dementia in other diseases classified elsewhere without behavioral disturbance: Secondary | ICD-10-CM | POA: Diagnosis not present

## 2018-11-27 DIAGNOSIS — I6523 Occlusion and stenosis of bilateral carotid arteries: Secondary | ICD-10-CM | POA: Diagnosis not present

## 2018-11-27 DIAGNOSIS — R451 Restlessness and agitation: Secondary | ICD-10-CM | POA: Diagnosis not present

## 2018-11-27 DIAGNOSIS — Z515 Encounter for palliative care: Secondary | ICD-10-CM | POA: Diagnosis not present

## 2018-11-27 DIAGNOSIS — R410 Disorientation, unspecified: Secondary | ICD-10-CM | POA: Diagnosis not present

## 2018-11-27 DIAGNOSIS — R296 Repeated falls: Secondary | ICD-10-CM | POA: Diagnosis not present

## 2018-11-27 DIAGNOSIS — R634 Abnormal weight loss: Secondary | ICD-10-CM | POA: Diagnosis not present

## 2018-11-28 DIAGNOSIS — I10 Essential (primary) hypertension: Secondary | ICD-10-CM | POA: Diagnosis not present

## 2018-11-28 DIAGNOSIS — R451 Restlessness and agitation: Secondary | ICD-10-CM | POA: Diagnosis not present

## 2018-11-28 DIAGNOSIS — F0280 Dementia in other diseases classified elsewhere without behavioral disturbance: Secondary | ICD-10-CM | POA: Diagnosis not present

## 2018-11-28 DIAGNOSIS — I779 Disorder of arteries and arterioles, unspecified: Secondary | ICD-10-CM | POA: Diagnosis not present

## 2018-11-28 DIAGNOSIS — G300 Alzheimer's disease with early onset: Secondary | ICD-10-CM | POA: Diagnosis not present

## 2018-11-28 DIAGNOSIS — I6523 Occlusion and stenosis of bilateral carotid arteries: Secondary | ICD-10-CM | POA: Diagnosis not present

## 2018-11-28 DIAGNOSIS — R296 Repeated falls: Secondary | ICD-10-CM | POA: Diagnosis not present

## 2018-11-28 DIAGNOSIS — R410 Disorientation, unspecified: Secondary | ICD-10-CM | POA: Diagnosis not present

## 2018-11-28 DIAGNOSIS — R634 Abnormal weight loss: Secondary | ICD-10-CM | POA: Diagnosis not present

## 2018-11-28 DIAGNOSIS — Z515 Encounter for palliative care: Secondary | ICD-10-CM | POA: Diagnosis not present

## 2018-11-28 DIAGNOSIS — Z66 Do not resuscitate: Secondary | ICD-10-CM | POA: Diagnosis not present

## 2018-11-29 DIAGNOSIS — I10 Essential (primary) hypertension: Secondary | ICD-10-CM | POA: Diagnosis not present

## 2018-11-29 DIAGNOSIS — R410 Disorientation, unspecified: Secondary | ICD-10-CM | POA: Diagnosis not present

## 2018-11-29 DIAGNOSIS — I779 Disorder of arteries and arterioles, unspecified: Secondary | ICD-10-CM | POA: Diagnosis not present

## 2018-11-29 DIAGNOSIS — F0280 Dementia in other diseases classified elsewhere without behavioral disturbance: Secondary | ICD-10-CM | POA: Diagnosis not present

## 2018-11-29 DIAGNOSIS — R451 Restlessness and agitation: Secondary | ICD-10-CM | POA: Diagnosis not present

## 2018-11-29 DIAGNOSIS — R296 Repeated falls: Secondary | ICD-10-CM | POA: Diagnosis not present

## 2018-11-29 DIAGNOSIS — G300 Alzheimer's disease with early onset: Secondary | ICD-10-CM | POA: Diagnosis not present

## 2018-11-29 DIAGNOSIS — I6523 Occlusion and stenosis of bilateral carotid arteries: Secondary | ICD-10-CM | POA: Diagnosis not present

## 2018-11-29 DIAGNOSIS — Z66 Do not resuscitate: Secondary | ICD-10-CM | POA: Diagnosis not present

## 2018-11-29 DIAGNOSIS — R634 Abnormal weight loss: Secondary | ICD-10-CM | POA: Diagnosis not present

## 2018-11-29 DIAGNOSIS — Z515 Encounter for palliative care: Secondary | ICD-10-CM | POA: Diagnosis not present

## 2018-11-30 DIAGNOSIS — Z515 Encounter for palliative care: Secondary | ICD-10-CM | POA: Diagnosis not present

## 2018-11-30 DIAGNOSIS — Z66 Do not resuscitate: Secondary | ICD-10-CM | POA: Diagnosis not present

## 2018-11-30 DIAGNOSIS — R634 Abnormal weight loss: Secondary | ICD-10-CM | POA: Diagnosis not present

## 2018-11-30 DIAGNOSIS — I10 Essential (primary) hypertension: Secondary | ICD-10-CM | POA: Diagnosis not present

## 2018-11-30 DIAGNOSIS — I6523 Occlusion and stenosis of bilateral carotid arteries: Secondary | ICD-10-CM | POA: Diagnosis not present

## 2018-11-30 DIAGNOSIS — I779 Disorder of arteries and arterioles, unspecified: Secondary | ICD-10-CM | POA: Diagnosis not present

## 2018-11-30 DIAGNOSIS — R451 Restlessness and agitation: Secondary | ICD-10-CM | POA: Diagnosis not present

## 2018-11-30 DIAGNOSIS — R410 Disorientation, unspecified: Secondary | ICD-10-CM | POA: Diagnosis not present

## 2018-11-30 DIAGNOSIS — R296 Repeated falls: Secondary | ICD-10-CM | POA: Diagnosis not present

## 2018-11-30 DIAGNOSIS — F0280 Dementia in other diseases classified elsewhere without behavioral disturbance: Secondary | ICD-10-CM | POA: Diagnosis not present

## 2018-11-30 DIAGNOSIS — G300 Alzheimer's disease with early onset: Secondary | ICD-10-CM | POA: Diagnosis not present

## 2018-12-01 DIAGNOSIS — R634 Abnormal weight loss: Secondary | ICD-10-CM | POA: Diagnosis not present

## 2018-12-01 DIAGNOSIS — R410 Disorientation, unspecified: Secondary | ICD-10-CM | POA: Diagnosis not present

## 2018-12-01 DIAGNOSIS — I6523 Occlusion and stenosis of bilateral carotid arteries: Secondary | ICD-10-CM | POA: Diagnosis not present

## 2018-12-01 DIAGNOSIS — Z515 Encounter for palliative care: Secondary | ICD-10-CM | POA: Diagnosis not present

## 2018-12-01 DIAGNOSIS — I779 Disorder of arteries and arterioles, unspecified: Secondary | ICD-10-CM | POA: Diagnosis not present

## 2018-12-01 DIAGNOSIS — F0280 Dementia in other diseases classified elsewhere without behavioral disturbance: Secondary | ICD-10-CM | POA: Diagnosis not present

## 2018-12-01 DIAGNOSIS — Z66 Do not resuscitate: Secondary | ICD-10-CM | POA: Diagnosis not present

## 2018-12-01 DIAGNOSIS — I10 Essential (primary) hypertension: Secondary | ICD-10-CM | POA: Diagnosis not present

## 2018-12-01 DIAGNOSIS — R296 Repeated falls: Secondary | ICD-10-CM | POA: Diagnosis not present

## 2018-12-01 DIAGNOSIS — R451 Restlessness and agitation: Secondary | ICD-10-CM | POA: Diagnosis not present

## 2018-12-01 DIAGNOSIS — G300 Alzheimer's disease with early onset: Secondary | ICD-10-CM | POA: Diagnosis not present

## 2018-12-02 DIAGNOSIS — Z66 Do not resuscitate: Secondary | ICD-10-CM | POA: Diagnosis not present

## 2018-12-02 DIAGNOSIS — G300 Alzheimer's disease with early onset: Secondary | ICD-10-CM | POA: Diagnosis not present

## 2018-12-02 DIAGNOSIS — I6523 Occlusion and stenosis of bilateral carotid arteries: Secondary | ICD-10-CM | POA: Diagnosis not present

## 2018-12-02 DIAGNOSIS — R634 Abnormal weight loss: Secondary | ICD-10-CM | POA: Diagnosis not present

## 2018-12-02 DIAGNOSIS — Z515 Encounter for palliative care: Secondary | ICD-10-CM | POA: Diagnosis not present

## 2018-12-02 DIAGNOSIS — R451 Restlessness and agitation: Secondary | ICD-10-CM | POA: Diagnosis not present

## 2018-12-02 DIAGNOSIS — I10 Essential (primary) hypertension: Secondary | ICD-10-CM | POA: Diagnosis not present

## 2018-12-02 DIAGNOSIS — F0280 Dementia in other diseases classified elsewhere without behavioral disturbance: Secondary | ICD-10-CM | POA: Diagnosis not present

## 2018-12-02 DIAGNOSIS — I779 Disorder of arteries and arterioles, unspecified: Secondary | ICD-10-CM | POA: Diagnosis not present

## 2018-12-02 DIAGNOSIS — R410 Disorientation, unspecified: Secondary | ICD-10-CM | POA: Diagnosis not present

## 2018-12-02 DIAGNOSIS — R296 Repeated falls: Secondary | ICD-10-CM | POA: Diagnosis not present

## 2018-12-03 DIAGNOSIS — R634 Abnormal weight loss: Secondary | ICD-10-CM | POA: Diagnosis not present

## 2018-12-03 DIAGNOSIS — R296 Repeated falls: Secondary | ICD-10-CM | POA: Diagnosis not present

## 2018-12-03 DIAGNOSIS — R410 Disorientation, unspecified: Secondary | ICD-10-CM | POA: Diagnosis not present

## 2018-12-03 DIAGNOSIS — I10 Essential (primary) hypertension: Secondary | ICD-10-CM | POA: Diagnosis not present

## 2018-12-03 DIAGNOSIS — Z66 Do not resuscitate: Secondary | ICD-10-CM | POA: Diagnosis not present

## 2018-12-03 DIAGNOSIS — I779 Disorder of arteries and arterioles, unspecified: Secondary | ICD-10-CM | POA: Diagnosis not present

## 2018-12-03 DIAGNOSIS — F0280 Dementia in other diseases classified elsewhere without behavioral disturbance: Secondary | ICD-10-CM | POA: Diagnosis not present

## 2018-12-03 DIAGNOSIS — R451 Restlessness and agitation: Secondary | ICD-10-CM | POA: Diagnosis not present

## 2018-12-03 DIAGNOSIS — I6523 Occlusion and stenosis of bilateral carotid arteries: Secondary | ICD-10-CM | POA: Diagnosis not present

## 2018-12-03 DIAGNOSIS — G300 Alzheimer's disease with early onset: Secondary | ICD-10-CM | POA: Diagnosis not present

## 2018-12-03 DIAGNOSIS — Z515 Encounter for palliative care: Secondary | ICD-10-CM | POA: Diagnosis not present

## 2018-12-04 DIAGNOSIS — Z66 Do not resuscitate: Secondary | ICD-10-CM | POA: Diagnosis not present

## 2018-12-04 DIAGNOSIS — I10 Essential (primary) hypertension: Secondary | ICD-10-CM | POA: Diagnosis not present

## 2018-12-04 DIAGNOSIS — F0280 Dementia in other diseases classified elsewhere without behavioral disturbance: Secondary | ICD-10-CM | POA: Diagnosis not present

## 2018-12-04 DIAGNOSIS — I6523 Occlusion and stenosis of bilateral carotid arteries: Secondary | ICD-10-CM | POA: Diagnosis not present

## 2018-12-04 DIAGNOSIS — I779 Disorder of arteries and arterioles, unspecified: Secondary | ICD-10-CM | POA: Diagnosis not present

## 2018-12-04 DIAGNOSIS — R451 Restlessness and agitation: Secondary | ICD-10-CM | POA: Diagnosis not present

## 2018-12-04 DIAGNOSIS — Z515 Encounter for palliative care: Secondary | ICD-10-CM | POA: Diagnosis not present

## 2018-12-04 DIAGNOSIS — R410 Disorientation, unspecified: Secondary | ICD-10-CM | POA: Diagnosis not present

## 2018-12-04 DIAGNOSIS — R634 Abnormal weight loss: Secondary | ICD-10-CM | POA: Diagnosis not present

## 2018-12-04 DIAGNOSIS — R296 Repeated falls: Secondary | ICD-10-CM | POA: Diagnosis not present

## 2018-12-04 DIAGNOSIS — G300 Alzheimer's disease with early onset: Secondary | ICD-10-CM | POA: Diagnosis not present

## 2018-12-05 DIAGNOSIS — F0280 Dementia in other diseases classified elsewhere without behavioral disturbance: Secondary | ICD-10-CM | POA: Diagnosis not present

## 2018-12-05 DIAGNOSIS — R410 Disorientation, unspecified: Secondary | ICD-10-CM | POA: Diagnosis not present

## 2018-12-05 DIAGNOSIS — R296 Repeated falls: Secondary | ICD-10-CM | POA: Diagnosis not present

## 2018-12-05 DIAGNOSIS — G300 Alzheimer's disease with early onset: Secondary | ICD-10-CM | POA: Diagnosis not present

## 2018-12-05 DIAGNOSIS — Z515 Encounter for palliative care: Secondary | ICD-10-CM | POA: Diagnosis not present

## 2018-12-05 DIAGNOSIS — I6523 Occlusion and stenosis of bilateral carotid arteries: Secondary | ICD-10-CM | POA: Diagnosis not present

## 2018-12-05 DIAGNOSIS — R634 Abnormal weight loss: Secondary | ICD-10-CM | POA: Diagnosis not present

## 2018-12-05 DIAGNOSIS — I779 Disorder of arteries and arterioles, unspecified: Secondary | ICD-10-CM | POA: Diagnosis not present

## 2018-12-05 DIAGNOSIS — R451 Restlessness and agitation: Secondary | ICD-10-CM | POA: Diagnosis not present

## 2018-12-05 DIAGNOSIS — Z66 Do not resuscitate: Secondary | ICD-10-CM | POA: Diagnosis not present

## 2018-12-05 DIAGNOSIS — I10 Essential (primary) hypertension: Secondary | ICD-10-CM | POA: Diagnosis not present

## 2018-12-06 DIAGNOSIS — R296 Repeated falls: Secondary | ICD-10-CM | POA: Diagnosis not present

## 2018-12-06 DIAGNOSIS — R410 Disorientation, unspecified: Secondary | ICD-10-CM | POA: Diagnosis not present

## 2018-12-06 DIAGNOSIS — F0280 Dementia in other diseases classified elsewhere without behavioral disturbance: Secondary | ICD-10-CM | POA: Diagnosis not present

## 2018-12-06 DIAGNOSIS — I10 Essential (primary) hypertension: Secondary | ICD-10-CM | POA: Diagnosis not present

## 2018-12-06 DIAGNOSIS — G300 Alzheimer's disease with early onset: Secondary | ICD-10-CM | POA: Diagnosis not present

## 2018-12-06 DIAGNOSIS — Z515 Encounter for palliative care: Secondary | ICD-10-CM | POA: Diagnosis not present

## 2018-12-06 DIAGNOSIS — R634 Abnormal weight loss: Secondary | ICD-10-CM | POA: Diagnosis not present

## 2018-12-06 DIAGNOSIS — R451 Restlessness and agitation: Secondary | ICD-10-CM | POA: Diagnosis not present

## 2018-12-06 DIAGNOSIS — I779 Disorder of arteries and arterioles, unspecified: Secondary | ICD-10-CM | POA: Diagnosis not present

## 2018-12-06 DIAGNOSIS — Z66 Do not resuscitate: Secondary | ICD-10-CM | POA: Diagnosis not present

## 2018-12-06 DIAGNOSIS — I6523 Occlusion and stenosis of bilateral carotid arteries: Secondary | ICD-10-CM | POA: Diagnosis not present

## 2018-12-07 DIAGNOSIS — Z66 Do not resuscitate: Secondary | ICD-10-CM | POA: Diagnosis not present

## 2018-12-07 DIAGNOSIS — Z515 Encounter for palliative care: Secondary | ICD-10-CM | POA: Diagnosis not present

## 2018-12-07 DIAGNOSIS — I6523 Occlusion and stenosis of bilateral carotid arteries: Secondary | ICD-10-CM | POA: Diagnosis not present

## 2018-12-07 DIAGNOSIS — I779 Disorder of arteries and arterioles, unspecified: Secondary | ICD-10-CM | POA: Diagnosis not present

## 2018-12-07 DIAGNOSIS — R634 Abnormal weight loss: Secondary | ICD-10-CM | POA: Diagnosis not present

## 2018-12-07 DIAGNOSIS — R410 Disorientation, unspecified: Secondary | ICD-10-CM | POA: Diagnosis not present

## 2018-12-07 DIAGNOSIS — R451 Restlessness and agitation: Secondary | ICD-10-CM | POA: Diagnosis not present

## 2018-12-07 DIAGNOSIS — R296 Repeated falls: Secondary | ICD-10-CM | POA: Diagnosis not present

## 2018-12-07 DIAGNOSIS — I10 Essential (primary) hypertension: Secondary | ICD-10-CM | POA: Diagnosis not present

## 2018-12-07 DIAGNOSIS — G300 Alzheimer's disease with early onset: Secondary | ICD-10-CM | POA: Diagnosis not present

## 2018-12-07 DIAGNOSIS — F0280 Dementia in other diseases classified elsewhere without behavioral disturbance: Secondary | ICD-10-CM | POA: Diagnosis not present

## 2018-12-08 DIAGNOSIS — I10 Essential (primary) hypertension: Secondary | ICD-10-CM | POA: Diagnosis not present

## 2018-12-08 DIAGNOSIS — R296 Repeated falls: Secondary | ICD-10-CM | POA: Diagnosis not present

## 2018-12-08 DIAGNOSIS — F0280 Dementia in other diseases classified elsewhere without behavioral disturbance: Secondary | ICD-10-CM | POA: Diagnosis not present

## 2018-12-08 DIAGNOSIS — Z515 Encounter for palliative care: Secondary | ICD-10-CM | POA: Diagnosis not present

## 2018-12-08 DIAGNOSIS — R451 Restlessness and agitation: Secondary | ICD-10-CM | POA: Diagnosis not present

## 2018-12-08 DIAGNOSIS — R634 Abnormal weight loss: Secondary | ICD-10-CM | POA: Diagnosis not present

## 2018-12-08 DIAGNOSIS — G300 Alzheimer's disease with early onset: Secondary | ICD-10-CM | POA: Diagnosis not present

## 2018-12-08 DIAGNOSIS — Z66 Do not resuscitate: Secondary | ICD-10-CM | POA: Diagnosis not present

## 2018-12-08 DIAGNOSIS — I6523 Occlusion and stenosis of bilateral carotid arteries: Secondary | ICD-10-CM | POA: Diagnosis not present

## 2018-12-08 DIAGNOSIS — R410 Disorientation, unspecified: Secondary | ICD-10-CM | POA: Diagnosis not present

## 2018-12-08 DIAGNOSIS — I779 Disorder of arteries and arterioles, unspecified: Secondary | ICD-10-CM | POA: Diagnosis not present

## 2018-12-09 DIAGNOSIS — I10 Essential (primary) hypertension: Secondary | ICD-10-CM | POA: Diagnosis not present

## 2018-12-09 DIAGNOSIS — Z66 Do not resuscitate: Secondary | ICD-10-CM | POA: Diagnosis not present

## 2018-12-09 DIAGNOSIS — Z515 Encounter for palliative care: Secondary | ICD-10-CM | POA: Diagnosis not present

## 2018-12-09 DIAGNOSIS — R451 Restlessness and agitation: Secondary | ICD-10-CM | POA: Diagnosis not present

## 2018-12-09 DIAGNOSIS — R634 Abnormal weight loss: Secondary | ICD-10-CM | POA: Diagnosis not present

## 2018-12-09 DIAGNOSIS — R410 Disorientation, unspecified: Secondary | ICD-10-CM | POA: Diagnosis not present

## 2018-12-09 DIAGNOSIS — F0280 Dementia in other diseases classified elsewhere without behavioral disturbance: Secondary | ICD-10-CM | POA: Diagnosis not present

## 2018-12-09 DIAGNOSIS — I6523 Occlusion and stenosis of bilateral carotid arteries: Secondary | ICD-10-CM | POA: Diagnosis not present

## 2018-12-09 DIAGNOSIS — G300 Alzheimer's disease with early onset: Secondary | ICD-10-CM | POA: Diagnosis not present

## 2018-12-09 DIAGNOSIS — I779 Disorder of arteries and arterioles, unspecified: Secondary | ICD-10-CM | POA: Diagnosis not present

## 2018-12-09 DIAGNOSIS — R296 Repeated falls: Secondary | ICD-10-CM | POA: Diagnosis not present

## 2018-12-10 DIAGNOSIS — R296 Repeated falls: Secondary | ICD-10-CM | POA: Diagnosis not present

## 2018-12-10 DIAGNOSIS — G300 Alzheimer's disease with early onset: Secondary | ICD-10-CM | POA: Diagnosis not present

## 2018-12-10 DIAGNOSIS — I10 Essential (primary) hypertension: Secondary | ICD-10-CM | POA: Diagnosis not present

## 2018-12-10 DIAGNOSIS — Z515 Encounter for palliative care: Secondary | ICD-10-CM | POA: Diagnosis not present

## 2018-12-10 DIAGNOSIS — R410 Disorientation, unspecified: Secondary | ICD-10-CM | POA: Diagnosis not present

## 2018-12-10 DIAGNOSIS — F0280 Dementia in other diseases classified elsewhere without behavioral disturbance: Secondary | ICD-10-CM | POA: Diagnosis not present

## 2018-12-10 DIAGNOSIS — R634 Abnormal weight loss: Secondary | ICD-10-CM | POA: Diagnosis not present

## 2018-12-10 DIAGNOSIS — I6523 Occlusion and stenosis of bilateral carotid arteries: Secondary | ICD-10-CM | POA: Diagnosis not present

## 2018-12-10 DIAGNOSIS — Z66 Do not resuscitate: Secondary | ICD-10-CM | POA: Diagnosis not present

## 2018-12-10 DIAGNOSIS — R451 Restlessness and agitation: Secondary | ICD-10-CM | POA: Diagnosis not present

## 2018-12-10 DIAGNOSIS — I779 Disorder of arteries and arterioles, unspecified: Secondary | ICD-10-CM | POA: Diagnosis not present

## 2018-12-11 DIAGNOSIS — I6523 Occlusion and stenosis of bilateral carotid arteries: Secondary | ICD-10-CM | POA: Diagnosis not present

## 2018-12-11 DIAGNOSIS — I779 Disorder of arteries and arterioles, unspecified: Secondary | ICD-10-CM | POA: Diagnosis not present

## 2018-12-11 DIAGNOSIS — Z66 Do not resuscitate: Secondary | ICD-10-CM | POA: Diagnosis not present

## 2018-12-11 DIAGNOSIS — Z515 Encounter for palliative care: Secondary | ICD-10-CM | POA: Diagnosis not present

## 2018-12-11 DIAGNOSIS — R634 Abnormal weight loss: Secondary | ICD-10-CM | POA: Diagnosis not present

## 2018-12-11 DIAGNOSIS — R296 Repeated falls: Secondary | ICD-10-CM | POA: Diagnosis not present

## 2018-12-11 DIAGNOSIS — F0280 Dementia in other diseases classified elsewhere without behavioral disturbance: Secondary | ICD-10-CM | POA: Diagnosis not present

## 2018-12-11 DIAGNOSIS — R410 Disorientation, unspecified: Secondary | ICD-10-CM | POA: Diagnosis not present

## 2018-12-11 DIAGNOSIS — R451 Restlessness and agitation: Secondary | ICD-10-CM | POA: Diagnosis not present

## 2018-12-11 DIAGNOSIS — I10 Essential (primary) hypertension: Secondary | ICD-10-CM | POA: Diagnosis not present

## 2018-12-11 DIAGNOSIS — G300 Alzheimer's disease with early onset: Secondary | ICD-10-CM | POA: Diagnosis not present

## 2018-12-12 DIAGNOSIS — Z515 Encounter for palliative care: Secondary | ICD-10-CM | POA: Diagnosis not present

## 2018-12-12 DIAGNOSIS — I6523 Occlusion and stenosis of bilateral carotid arteries: Secondary | ICD-10-CM | POA: Diagnosis not present

## 2018-12-12 DIAGNOSIS — I779 Disorder of arteries and arterioles, unspecified: Secondary | ICD-10-CM | POA: Diagnosis not present

## 2018-12-12 DIAGNOSIS — G300 Alzheimer's disease with early onset: Secondary | ICD-10-CM | POA: Diagnosis not present

## 2018-12-12 DIAGNOSIS — R410 Disorientation, unspecified: Secondary | ICD-10-CM | POA: Diagnosis not present

## 2018-12-12 DIAGNOSIS — Z66 Do not resuscitate: Secondary | ICD-10-CM | POA: Diagnosis not present

## 2018-12-12 DIAGNOSIS — R296 Repeated falls: Secondary | ICD-10-CM | POA: Diagnosis not present

## 2018-12-12 DIAGNOSIS — F0280 Dementia in other diseases classified elsewhere without behavioral disturbance: Secondary | ICD-10-CM | POA: Diagnosis not present

## 2018-12-12 DIAGNOSIS — R451 Restlessness and agitation: Secondary | ICD-10-CM | POA: Diagnosis not present

## 2018-12-12 DIAGNOSIS — I10 Essential (primary) hypertension: Secondary | ICD-10-CM | POA: Diagnosis not present

## 2018-12-12 DIAGNOSIS — R634 Abnormal weight loss: Secondary | ICD-10-CM | POA: Diagnosis not present

## 2018-12-13 DIAGNOSIS — I6523 Occlusion and stenosis of bilateral carotid arteries: Secondary | ICD-10-CM | POA: Diagnosis not present

## 2018-12-13 DIAGNOSIS — I779 Disorder of arteries and arterioles, unspecified: Secondary | ICD-10-CM | POA: Diagnosis not present

## 2018-12-13 DIAGNOSIS — R296 Repeated falls: Secondary | ICD-10-CM | POA: Diagnosis not present

## 2018-12-13 DIAGNOSIS — R451 Restlessness and agitation: Secondary | ICD-10-CM | POA: Diagnosis not present

## 2018-12-13 DIAGNOSIS — Z515 Encounter for palliative care: Secondary | ICD-10-CM | POA: Diagnosis not present

## 2018-12-13 DIAGNOSIS — F0280 Dementia in other diseases classified elsewhere without behavioral disturbance: Secondary | ICD-10-CM | POA: Diagnosis not present

## 2018-12-13 DIAGNOSIS — Z66 Do not resuscitate: Secondary | ICD-10-CM | POA: Diagnosis not present

## 2018-12-13 DIAGNOSIS — I10 Essential (primary) hypertension: Secondary | ICD-10-CM | POA: Diagnosis not present

## 2018-12-13 DIAGNOSIS — R410 Disorientation, unspecified: Secondary | ICD-10-CM | POA: Diagnosis not present

## 2018-12-13 DIAGNOSIS — R634 Abnormal weight loss: Secondary | ICD-10-CM | POA: Diagnosis not present

## 2018-12-13 DIAGNOSIS — G300 Alzheimer's disease with early onset: Secondary | ICD-10-CM | POA: Diagnosis not present

## 2018-12-14 DIAGNOSIS — R410 Disorientation, unspecified: Secondary | ICD-10-CM | POA: Diagnosis not present

## 2018-12-14 DIAGNOSIS — Z515 Encounter for palliative care: Secondary | ICD-10-CM | POA: Diagnosis not present

## 2018-12-14 DIAGNOSIS — R634 Abnormal weight loss: Secondary | ICD-10-CM | POA: Diagnosis not present

## 2018-12-14 DIAGNOSIS — R296 Repeated falls: Secondary | ICD-10-CM | POA: Diagnosis not present

## 2018-12-14 DIAGNOSIS — G300 Alzheimer's disease with early onset: Secondary | ICD-10-CM | POA: Diagnosis not present

## 2018-12-14 DIAGNOSIS — I10 Essential (primary) hypertension: Secondary | ICD-10-CM | POA: Diagnosis not present

## 2018-12-14 DIAGNOSIS — I6523 Occlusion and stenosis of bilateral carotid arteries: Secondary | ICD-10-CM | POA: Diagnosis not present

## 2018-12-14 DIAGNOSIS — I779 Disorder of arteries and arterioles, unspecified: Secondary | ICD-10-CM | POA: Diagnosis not present

## 2018-12-14 DIAGNOSIS — F0280 Dementia in other diseases classified elsewhere without behavioral disturbance: Secondary | ICD-10-CM | POA: Diagnosis not present

## 2018-12-14 DIAGNOSIS — R451 Restlessness and agitation: Secondary | ICD-10-CM | POA: Diagnosis not present

## 2018-12-14 DIAGNOSIS — Z66 Do not resuscitate: Secondary | ICD-10-CM | POA: Diagnosis not present

## 2018-12-15 DIAGNOSIS — R296 Repeated falls: Secondary | ICD-10-CM | POA: Diagnosis not present

## 2018-12-15 DIAGNOSIS — I10 Essential (primary) hypertension: Secondary | ICD-10-CM | POA: Diagnosis not present

## 2018-12-15 DIAGNOSIS — Z66 Do not resuscitate: Secondary | ICD-10-CM | POA: Diagnosis not present

## 2018-12-15 DIAGNOSIS — I779 Disorder of arteries and arterioles, unspecified: Secondary | ICD-10-CM | POA: Diagnosis not present

## 2018-12-15 DIAGNOSIS — G300 Alzheimer's disease with early onset: Secondary | ICD-10-CM | POA: Diagnosis not present

## 2018-12-15 DIAGNOSIS — R451 Restlessness and agitation: Secondary | ICD-10-CM | POA: Diagnosis not present

## 2018-12-15 DIAGNOSIS — Z515 Encounter for palliative care: Secondary | ICD-10-CM | POA: Diagnosis not present

## 2018-12-15 DIAGNOSIS — F0280 Dementia in other diseases classified elsewhere without behavioral disturbance: Secondary | ICD-10-CM | POA: Diagnosis not present

## 2018-12-15 DIAGNOSIS — R410 Disorientation, unspecified: Secondary | ICD-10-CM | POA: Diagnosis not present

## 2018-12-15 DIAGNOSIS — R634 Abnormal weight loss: Secondary | ICD-10-CM | POA: Diagnosis not present

## 2018-12-15 DIAGNOSIS — I6523 Occlusion and stenosis of bilateral carotid arteries: Secondary | ICD-10-CM | POA: Diagnosis not present

## 2018-12-16 DIAGNOSIS — Z66 Do not resuscitate: Secondary | ICD-10-CM | POA: Diagnosis not present

## 2018-12-16 DIAGNOSIS — I779 Disorder of arteries and arterioles, unspecified: Secondary | ICD-10-CM | POA: Diagnosis not present

## 2018-12-16 DIAGNOSIS — R410 Disorientation, unspecified: Secondary | ICD-10-CM | POA: Diagnosis not present

## 2018-12-16 DIAGNOSIS — I6523 Occlusion and stenosis of bilateral carotid arteries: Secondary | ICD-10-CM | POA: Diagnosis not present

## 2018-12-16 DIAGNOSIS — F0280 Dementia in other diseases classified elsewhere without behavioral disturbance: Secondary | ICD-10-CM | POA: Diagnosis not present

## 2018-12-16 DIAGNOSIS — R634 Abnormal weight loss: Secondary | ICD-10-CM | POA: Diagnosis not present

## 2018-12-16 DIAGNOSIS — R296 Repeated falls: Secondary | ICD-10-CM | POA: Diagnosis not present

## 2018-12-16 DIAGNOSIS — R451 Restlessness and agitation: Secondary | ICD-10-CM | POA: Diagnosis not present

## 2018-12-16 DIAGNOSIS — I10 Essential (primary) hypertension: Secondary | ICD-10-CM | POA: Diagnosis not present

## 2018-12-16 DIAGNOSIS — G300 Alzheimer's disease with early onset: Secondary | ICD-10-CM | POA: Diagnosis not present

## 2018-12-16 DIAGNOSIS — Z515 Encounter for palliative care: Secondary | ICD-10-CM | POA: Diagnosis not present

## 2018-12-17 DIAGNOSIS — Z515 Encounter for palliative care: Secondary | ICD-10-CM | POA: Diagnosis not present

## 2018-12-17 DIAGNOSIS — R451 Restlessness and agitation: Secondary | ICD-10-CM | POA: Diagnosis not present

## 2018-12-17 DIAGNOSIS — I779 Disorder of arteries and arterioles, unspecified: Secondary | ICD-10-CM | POA: Diagnosis not present

## 2018-12-17 DIAGNOSIS — Z66 Do not resuscitate: Secondary | ICD-10-CM | POA: Diagnosis not present

## 2018-12-17 DIAGNOSIS — F0280 Dementia in other diseases classified elsewhere without behavioral disturbance: Secondary | ICD-10-CM | POA: Diagnosis not present

## 2018-12-17 DIAGNOSIS — R410 Disorientation, unspecified: Secondary | ICD-10-CM | POA: Diagnosis not present

## 2018-12-17 DIAGNOSIS — I6523 Occlusion and stenosis of bilateral carotid arteries: Secondary | ICD-10-CM | POA: Diagnosis not present

## 2018-12-17 DIAGNOSIS — G300 Alzheimer's disease with early onset: Secondary | ICD-10-CM | POA: Diagnosis not present

## 2018-12-17 DIAGNOSIS — I10 Essential (primary) hypertension: Secondary | ICD-10-CM | POA: Diagnosis not present

## 2018-12-17 DIAGNOSIS — R296 Repeated falls: Secondary | ICD-10-CM | POA: Diagnosis not present

## 2018-12-17 DIAGNOSIS — R634 Abnormal weight loss: Secondary | ICD-10-CM | POA: Diagnosis not present

## 2018-12-18 DIAGNOSIS — G300 Alzheimer's disease with early onset: Secondary | ICD-10-CM | POA: Diagnosis not present

## 2018-12-18 DIAGNOSIS — R296 Repeated falls: Secondary | ICD-10-CM | POA: Diagnosis not present

## 2018-12-18 DIAGNOSIS — R410 Disorientation, unspecified: Secondary | ICD-10-CM | POA: Diagnosis not present

## 2018-12-18 DIAGNOSIS — I779 Disorder of arteries and arterioles, unspecified: Secondary | ICD-10-CM | POA: Diagnosis not present

## 2018-12-18 DIAGNOSIS — R634 Abnormal weight loss: Secondary | ICD-10-CM | POA: Diagnosis not present

## 2018-12-18 DIAGNOSIS — I10 Essential (primary) hypertension: Secondary | ICD-10-CM | POA: Diagnosis not present

## 2018-12-18 DIAGNOSIS — Z515 Encounter for palliative care: Secondary | ICD-10-CM | POA: Diagnosis not present

## 2018-12-18 DIAGNOSIS — R451 Restlessness and agitation: Secondary | ICD-10-CM | POA: Diagnosis not present

## 2018-12-18 DIAGNOSIS — Z66 Do not resuscitate: Secondary | ICD-10-CM | POA: Diagnosis not present

## 2018-12-18 DIAGNOSIS — F0280 Dementia in other diseases classified elsewhere without behavioral disturbance: Secondary | ICD-10-CM | POA: Diagnosis not present

## 2018-12-18 DIAGNOSIS — I6523 Occlusion and stenosis of bilateral carotid arteries: Secondary | ICD-10-CM | POA: Diagnosis not present

## 2018-12-19 DIAGNOSIS — G300 Alzheimer's disease with early onset: Secondary | ICD-10-CM | POA: Diagnosis not present

## 2018-12-19 DIAGNOSIS — R296 Repeated falls: Secondary | ICD-10-CM | POA: Diagnosis not present

## 2018-12-19 DIAGNOSIS — F0280 Dementia in other diseases classified elsewhere without behavioral disturbance: Secondary | ICD-10-CM | POA: Diagnosis not present

## 2018-12-19 DIAGNOSIS — R634 Abnormal weight loss: Secondary | ICD-10-CM | POA: Diagnosis not present

## 2018-12-19 DIAGNOSIS — Z66 Do not resuscitate: Secondary | ICD-10-CM | POA: Diagnosis not present

## 2018-12-19 DIAGNOSIS — I6523 Occlusion and stenosis of bilateral carotid arteries: Secondary | ICD-10-CM | POA: Diagnosis not present

## 2018-12-19 DIAGNOSIS — I779 Disorder of arteries and arterioles, unspecified: Secondary | ICD-10-CM | POA: Diagnosis not present

## 2018-12-19 DIAGNOSIS — R451 Restlessness and agitation: Secondary | ICD-10-CM | POA: Diagnosis not present

## 2018-12-19 DIAGNOSIS — I10 Essential (primary) hypertension: Secondary | ICD-10-CM | POA: Diagnosis not present

## 2018-12-19 DIAGNOSIS — R410 Disorientation, unspecified: Secondary | ICD-10-CM | POA: Diagnosis not present

## 2018-12-19 DIAGNOSIS — Z515 Encounter for palliative care: Secondary | ICD-10-CM | POA: Diagnosis not present

## 2018-12-20 DIAGNOSIS — R634 Abnormal weight loss: Secondary | ICD-10-CM | POA: Diagnosis not present

## 2018-12-20 DIAGNOSIS — I10 Essential (primary) hypertension: Secondary | ICD-10-CM | POA: Diagnosis not present

## 2018-12-20 DIAGNOSIS — I6523 Occlusion and stenosis of bilateral carotid arteries: Secondary | ICD-10-CM | POA: Diagnosis not present

## 2018-12-20 DIAGNOSIS — R410 Disorientation, unspecified: Secondary | ICD-10-CM | POA: Diagnosis not present

## 2018-12-20 DIAGNOSIS — R451 Restlessness and agitation: Secondary | ICD-10-CM | POA: Diagnosis not present

## 2018-12-20 DIAGNOSIS — Z66 Do not resuscitate: Secondary | ICD-10-CM | POA: Diagnosis not present

## 2018-12-20 DIAGNOSIS — I779 Disorder of arteries and arterioles, unspecified: Secondary | ICD-10-CM | POA: Diagnosis not present

## 2018-12-20 DIAGNOSIS — Z515 Encounter for palliative care: Secondary | ICD-10-CM | POA: Diagnosis not present

## 2018-12-20 DIAGNOSIS — R296 Repeated falls: Secondary | ICD-10-CM | POA: Diagnosis not present

## 2018-12-20 DIAGNOSIS — F0280 Dementia in other diseases classified elsewhere without behavioral disturbance: Secondary | ICD-10-CM | POA: Diagnosis not present

## 2018-12-20 DIAGNOSIS — G300 Alzheimer's disease with early onset: Secondary | ICD-10-CM | POA: Diagnosis not present

## 2018-12-21 DIAGNOSIS — R634 Abnormal weight loss: Secondary | ICD-10-CM | POA: Diagnosis not present

## 2018-12-21 DIAGNOSIS — F0280 Dementia in other diseases classified elsewhere without behavioral disturbance: Secondary | ICD-10-CM | POA: Diagnosis not present

## 2018-12-21 DIAGNOSIS — R451 Restlessness and agitation: Secondary | ICD-10-CM | POA: Diagnosis not present

## 2018-12-21 DIAGNOSIS — Z515 Encounter for palliative care: Secondary | ICD-10-CM | POA: Diagnosis not present

## 2018-12-21 DIAGNOSIS — Z66 Do not resuscitate: Secondary | ICD-10-CM | POA: Diagnosis not present

## 2018-12-21 DIAGNOSIS — R410 Disorientation, unspecified: Secondary | ICD-10-CM | POA: Diagnosis not present

## 2018-12-21 DIAGNOSIS — G300 Alzheimer's disease with early onset: Secondary | ICD-10-CM | POA: Diagnosis not present

## 2018-12-21 DIAGNOSIS — I779 Disorder of arteries and arterioles, unspecified: Secondary | ICD-10-CM | POA: Diagnosis not present

## 2018-12-21 DIAGNOSIS — I10 Essential (primary) hypertension: Secondary | ICD-10-CM | POA: Diagnosis not present

## 2018-12-21 DIAGNOSIS — I6523 Occlusion and stenosis of bilateral carotid arteries: Secondary | ICD-10-CM | POA: Diagnosis not present

## 2018-12-21 DIAGNOSIS — R296 Repeated falls: Secondary | ICD-10-CM | POA: Diagnosis not present

## 2018-12-22 DIAGNOSIS — I6523 Occlusion and stenosis of bilateral carotid arteries: Secondary | ICD-10-CM | POA: Diagnosis not present

## 2018-12-22 DIAGNOSIS — I10 Essential (primary) hypertension: Secondary | ICD-10-CM | POA: Diagnosis not present

## 2018-12-22 DIAGNOSIS — R634 Abnormal weight loss: Secondary | ICD-10-CM | POA: Diagnosis not present

## 2018-12-22 DIAGNOSIS — I779 Disorder of arteries and arterioles, unspecified: Secondary | ICD-10-CM | POA: Diagnosis not present

## 2018-12-22 DIAGNOSIS — F0280 Dementia in other diseases classified elsewhere without behavioral disturbance: Secondary | ICD-10-CM | POA: Diagnosis not present

## 2018-12-22 DIAGNOSIS — G300 Alzheimer's disease with early onset: Secondary | ICD-10-CM | POA: Diagnosis not present

## 2018-12-22 DIAGNOSIS — Z66 Do not resuscitate: Secondary | ICD-10-CM | POA: Diagnosis not present

## 2018-12-22 DIAGNOSIS — R296 Repeated falls: Secondary | ICD-10-CM | POA: Diagnosis not present

## 2018-12-22 DIAGNOSIS — Z515 Encounter for palliative care: Secondary | ICD-10-CM | POA: Diagnosis not present

## 2018-12-22 DIAGNOSIS — R410 Disorientation, unspecified: Secondary | ICD-10-CM | POA: Diagnosis not present

## 2018-12-22 DIAGNOSIS — R451 Restlessness and agitation: Secondary | ICD-10-CM | POA: Diagnosis not present

## 2018-12-23 DIAGNOSIS — R634 Abnormal weight loss: Secondary | ICD-10-CM | POA: Diagnosis not present

## 2018-12-23 DIAGNOSIS — Z66 Do not resuscitate: Secondary | ICD-10-CM | POA: Diagnosis not present

## 2018-12-23 DIAGNOSIS — I10 Essential (primary) hypertension: Secondary | ICD-10-CM | POA: Diagnosis not present

## 2018-12-23 DIAGNOSIS — R451 Restlessness and agitation: Secondary | ICD-10-CM | POA: Diagnosis not present

## 2018-12-23 DIAGNOSIS — G300 Alzheimer's disease with early onset: Secondary | ICD-10-CM | POA: Diagnosis not present

## 2018-12-23 DIAGNOSIS — Z515 Encounter for palliative care: Secondary | ICD-10-CM | POA: Diagnosis not present

## 2018-12-23 DIAGNOSIS — I6523 Occlusion and stenosis of bilateral carotid arteries: Secondary | ICD-10-CM | POA: Diagnosis not present

## 2018-12-23 DIAGNOSIS — F0280 Dementia in other diseases classified elsewhere without behavioral disturbance: Secondary | ICD-10-CM | POA: Diagnosis not present

## 2018-12-23 DIAGNOSIS — R296 Repeated falls: Secondary | ICD-10-CM | POA: Diagnosis not present

## 2018-12-23 DIAGNOSIS — R410 Disorientation, unspecified: Secondary | ICD-10-CM | POA: Diagnosis not present

## 2018-12-23 DIAGNOSIS — I779 Disorder of arteries and arterioles, unspecified: Secondary | ICD-10-CM | POA: Diagnosis not present

## 2018-12-24 DIAGNOSIS — I6523 Occlusion and stenosis of bilateral carotid arteries: Secondary | ICD-10-CM | POA: Diagnosis not present

## 2018-12-24 DIAGNOSIS — Z515 Encounter for palliative care: Secondary | ICD-10-CM | POA: Diagnosis not present

## 2018-12-24 DIAGNOSIS — R451 Restlessness and agitation: Secondary | ICD-10-CM | POA: Diagnosis not present

## 2018-12-24 DIAGNOSIS — I779 Disorder of arteries and arterioles, unspecified: Secondary | ICD-10-CM | POA: Diagnosis not present

## 2018-12-24 DIAGNOSIS — R410 Disorientation, unspecified: Secondary | ICD-10-CM | POA: Diagnosis not present

## 2018-12-24 DIAGNOSIS — R634 Abnormal weight loss: Secondary | ICD-10-CM | POA: Diagnosis not present

## 2018-12-24 DIAGNOSIS — G300 Alzheimer's disease with early onset: Secondary | ICD-10-CM | POA: Diagnosis not present

## 2018-12-24 DIAGNOSIS — I10 Essential (primary) hypertension: Secondary | ICD-10-CM | POA: Diagnosis not present

## 2018-12-24 DIAGNOSIS — R296 Repeated falls: Secondary | ICD-10-CM | POA: Diagnosis not present

## 2018-12-24 DIAGNOSIS — Z66 Do not resuscitate: Secondary | ICD-10-CM | POA: Diagnosis not present

## 2018-12-24 DIAGNOSIS — F0280 Dementia in other diseases classified elsewhere without behavioral disturbance: Secondary | ICD-10-CM | POA: Diagnosis not present

## 2018-12-25 DIAGNOSIS — R296 Repeated falls: Secondary | ICD-10-CM | POA: Diagnosis not present

## 2018-12-25 DIAGNOSIS — F0280 Dementia in other diseases classified elsewhere without behavioral disturbance: Secondary | ICD-10-CM | POA: Diagnosis not present

## 2018-12-25 DIAGNOSIS — Z515 Encounter for palliative care: Secondary | ICD-10-CM | POA: Diagnosis not present

## 2018-12-25 DIAGNOSIS — I779 Disorder of arteries and arterioles, unspecified: Secondary | ICD-10-CM | POA: Diagnosis not present

## 2018-12-25 DIAGNOSIS — R410 Disorientation, unspecified: Secondary | ICD-10-CM | POA: Diagnosis not present

## 2018-12-25 DIAGNOSIS — R451 Restlessness and agitation: Secondary | ICD-10-CM | POA: Diagnosis not present

## 2018-12-25 DIAGNOSIS — I10 Essential (primary) hypertension: Secondary | ICD-10-CM | POA: Diagnosis not present

## 2018-12-25 DIAGNOSIS — R634 Abnormal weight loss: Secondary | ICD-10-CM | POA: Diagnosis not present

## 2018-12-25 DIAGNOSIS — G300 Alzheimer's disease with early onset: Secondary | ICD-10-CM | POA: Diagnosis not present

## 2018-12-25 DIAGNOSIS — Z66 Do not resuscitate: Secondary | ICD-10-CM | POA: Diagnosis not present

## 2018-12-25 DIAGNOSIS — I6523 Occlusion and stenosis of bilateral carotid arteries: Secondary | ICD-10-CM | POA: Diagnosis not present

## 2018-12-26 DIAGNOSIS — R296 Repeated falls: Secondary | ICD-10-CM | POA: Diagnosis not present

## 2018-12-26 DIAGNOSIS — R451 Restlessness and agitation: Secondary | ICD-10-CM | POA: Diagnosis not present

## 2018-12-26 DIAGNOSIS — Z66 Do not resuscitate: Secondary | ICD-10-CM | POA: Diagnosis not present

## 2018-12-26 DIAGNOSIS — Z515 Encounter for palliative care: Secondary | ICD-10-CM | POA: Diagnosis not present

## 2018-12-26 DIAGNOSIS — I779 Disorder of arteries and arterioles, unspecified: Secondary | ICD-10-CM | POA: Diagnosis not present

## 2018-12-26 DIAGNOSIS — I6523 Occlusion and stenosis of bilateral carotid arteries: Secondary | ICD-10-CM | POA: Diagnosis not present

## 2018-12-26 DIAGNOSIS — F0280 Dementia in other diseases classified elsewhere without behavioral disturbance: Secondary | ICD-10-CM | POA: Diagnosis not present

## 2018-12-26 DIAGNOSIS — R634 Abnormal weight loss: Secondary | ICD-10-CM | POA: Diagnosis not present

## 2018-12-26 DIAGNOSIS — R410 Disorientation, unspecified: Secondary | ICD-10-CM | POA: Diagnosis not present

## 2018-12-26 DIAGNOSIS — G300 Alzheimer's disease with early onset: Secondary | ICD-10-CM | POA: Diagnosis not present

## 2018-12-26 DIAGNOSIS — I10 Essential (primary) hypertension: Secondary | ICD-10-CM | POA: Diagnosis not present

## 2018-12-27 DIAGNOSIS — R634 Abnormal weight loss: Secondary | ICD-10-CM | POA: Diagnosis not present

## 2018-12-27 DIAGNOSIS — R410 Disorientation, unspecified: Secondary | ICD-10-CM | POA: Diagnosis not present

## 2018-12-27 DIAGNOSIS — R296 Repeated falls: Secondary | ICD-10-CM | POA: Diagnosis not present

## 2018-12-27 DIAGNOSIS — Z515 Encounter for palliative care: Secondary | ICD-10-CM | POA: Diagnosis not present

## 2018-12-27 DIAGNOSIS — R451 Restlessness and agitation: Secondary | ICD-10-CM | POA: Diagnosis not present

## 2018-12-27 DIAGNOSIS — G300 Alzheimer's disease with early onset: Secondary | ICD-10-CM | POA: Diagnosis not present

## 2018-12-27 DIAGNOSIS — I6523 Occlusion and stenosis of bilateral carotid arteries: Secondary | ICD-10-CM | POA: Diagnosis not present

## 2018-12-27 DIAGNOSIS — F0280 Dementia in other diseases classified elsewhere without behavioral disturbance: Secondary | ICD-10-CM | POA: Diagnosis not present

## 2018-12-27 DIAGNOSIS — Z66 Do not resuscitate: Secondary | ICD-10-CM | POA: Diagnosis not present

## 2018-12-27 DIAGNOSIS — I10 Essential (primary) hypertension: Secondary | ICD-10-CM | POA: Diagnosis not present

## 2018-12-27 DIAGNOSIS — I779 Disorder of arteries and arterioles, unspecified: Secondary | ICD-10-CM | POA: Diagnosis not present

## 2018-12-28 DIAGNOSIS — R634 Abnormal weight loss: Secondary | ICD-10-CM | POA: Diagnosis not present

## 2018-12-28 DIAGNOSIS — R410 Disorientation, unspecified: Secondary | ICD-10-CM | POA: Diagnosis not present

## 2018-12-28 DIAGNOSIS — R296 Repeated falls: Secondary | ICD-10-CM | POA: Diagnosis not present

## 2018-12-28 DIAGNOSIS — Z66 Do not resuscitate: Secondary | ICD-10-CM | POA: Diagnosis not present

## 2018-12-28 DIAGNOSIS — I779 Disorder of arteries and arterioles, unspecified: Secondary | ICD-10-CM | POA: Diagnosis not present

## 2018-12-28 DIAGNOSIS — I10 Essential (primary) hypertension: Secondary | ICD-10-CM | POA: Diagnosis not present

## 2018-12-28 DIAGNOSIS — R451 Restlessness and agitation: Secondary | ICD-10-CM | POA: Diagnosis not present

## 2018-12-28 DIAGNOSIS — I6523 Occlusion and stenosis of bilateral carotid arteries: Secondary | ICD-10-CM | POA: Diagnosis not present

## 2018-12-28 DIAGNOSIS — Z515 Encounter for palliative care: Secondary | ICD-10-CM | POA: Diagnosis not present

## 2018-12-28 DIAGNOSIS — F0280 Dementia in other diseases classified elsewhere without behavioral disturbance: Secondary | ICD-10-CM | POA: Diagnosis not present

## 2018-12-28 DIAGNOSIS — G300 Alzheimer's disease with early onset: Secondary | ICD-10-CM | POA: Diagnosis not present

## 2018-12-29 DIAGNOSIS — R410 Disorientation, unspecified: Secondary | ICD-10-CM | POA: Diagnosis not present

## 2018-12-29 DIAGNOSIS — Z515 Encounter for palliative care: Secondary | ICD-10-CM | POA: Diagnosis not present

## 2018-12-29 DIAGNOSIS — G300 Alzheimer's disease with early onset: Secondary | ICD-10-CM | POA: Diagnosis not present

## 2018-12-29 DIAGNOSIS — I6523 Occlusion and stenosis of bilateral carotid arteries: Secondary | ICD-10-CM | POA: Diagnosis not present

## 2018-12-29 DIAGNOSIS — F0280 Dementia in other diseases classified elsewhere without behavioral disturbance: Secondary | ICD-10-CM | POA: Diagnosis not present

## 2018-12-29 DIAGNOSIS — I779 Disorder of arteries and arterioles, unspecified: Secondary | ICD-10-CM | POA: Diagnosis not present

## 2018-12-29 DIAGNOSIS — Z66 Do not resuscitate: Secondary | ICD-10-CM | POA: Diagnosis not present

## 2018-12-29 DIAGNOSIS — R296 Repeated falls: Secondary | ICD-10-CM | POA: Diagnosis not present

## 2018-12-29 DIAGNOSIS — I10 Essential (primary) hypertension: Secondary | ICD-10-CM | POA: Diagnosis not present

## 2018-12-29 DIAGNOSIS — R451 Restlessness and agitation: Secondary | ICD-10-CM | POA: Diagnosis not present

## 2018-12-29 DIAGNOSIS — R634 Abnormal weight loss: Secondary | ICD-10-CM | POA: Diagnosis not present

## 2018-12-30 DIAGNOSIS — R451 Restlessness and agitation: Secondary | ICD-10-CM | POA: Diagnosis not present

## 2018-12-30 DIAGNOSIS — Z66 Do not resuscitate: Secondary | ICD-10-CM | POA: Diagnosis not present

## 2018-12-30 DIAGNOSIS — G300 Alzheimer's disease with early onset: Secondary | ICD-10-CM | POA: Diagnosis not present

## 2018-12-30 DIAGNOSIS — R634 Abnormal weight loss: Secondary | ICD-10-CM | POA: Diagnosis not present

## 2018-12-30 DIAGNOSIS — I6523 Occlusion and stenosis of bilateral carotid arteries: Secondary | ICD-10-CM | POA: Diagnosis not present

## 2018-12-30 DIAGNOSIS — Z515 Encounter for palliative care: Secondary | ICD-10-CM | POA: Diagnosis not present

## 2018-12-30 DIAGNOSIS — I779 Disorder of arteries and arterioles, unspecified: Secondary | ICD-10-CM | POA: Diagnosis not present

## 2018-12-30 DIAGNOSIS — F0280 Dementia in other diseases classified elsewhere without behavioral disturbance: Secondary | ICD-10-CM | POA: Diagnosis not present

## 2018-12-30 DIAGNOSIS — I10 Essential (primary) hypertension: Secondary | ICD-10-CM | POA: Diagnosis not present

## 2018-12-30 DIAGNOSIS — R296 Repeated falls: Secondary | ICD-10-CM | POA: Diagnosis not present

## 2018-12-30 DIAGNOSIS — R410 Disorientation, unspecified: Secondary | ICD-10-CM | POA: Diagnosis not present

## 2018-12-31 DIAGNOSIS — Z515 Encounter for palliative care: Secondary | ICD-10-CM | POA: Diagnosis not present

## 2018-12-31 DIAGNOSIS — I779 Disorder of arteries and arterioles, unspecified: Secondary | ICD-10-CM | POA: Diagnosis not present

## 2018-12-31 DIAGNOSIS — R634 Abnormal weight loss: Secondary | ICD-10-CM | POA: Diagnosis not present

## 2018-12-31 DIAGNOSIS — R451 Restlessness and agitation: Secondary | ICD-10-CM | POA: Diagnosis not present

## 2018-12-31 DIAGNOSIS — I10 Essential (primary) hypertension: Secondary | ICD-10-CM | POA: Diagnosis not present

## 2018-12-31 DIAGNOSIS — F0280 Dementia in other diseases classified elsewhere without behavioral disturbance: Secondary | ICD-10-CM | POA: Diagnosis not present

## 2018-12-31 DIAGNOSIS — I6523 Occlusion and stenosis of bilateral carotid arteries: Secondary | ICD-10-CM | POA: Diagnosis not present

## 2018-12-31 DIAGNOSIS — R296 Repeated falls: Secondary | ICD-10-CM | POA: Diagnosis not present

## 2018-12-31 DIAGNOSIS — G300 Alzheimer's disease with early onset: Secondary | ICD-10-CM | POA: Diagnosis not present

## 2018-12-31 DIAGNOSIS — Z66 Do not resuscitate: Secondary | ICD-10-CM | POA: Diagnosis not present

## 2018-12-31 DIAGNOSIS — R410 Disorientation, unspecified: Secondary | ICD-10-CM | POA: Diagnosis not present

## 2019-01-01 DIAGNOSIS — F0280 Dementia in other diseases classified elsewhere without behavioral disturbance: Secondary | ICD-10-CM | POA: Diagnosis not present

## 2019-01-01 DIAGNOSIS — R296 Repeated falls: Secondary | ICD-10-CM | POA: Diagnosis not present

## 2019-01-01 DIAGNOSIS — G300 Alzheimer's disease with early onset: Secondary | ICD-10-CM | POA: Diagnosis not present

## 2019-01-01 DIAGNOSIS — Z66 Do not resuscitate: Secondary | ICD-10-CM | POA: Diagnosis not present

## 2019-01-01 DIAGNOSIS — R634 Abnormal weight loss: Secondary | ICD-10-CM | POA: Diagnosis not present

## 2019-01-01 DIAGNOSIS — I6523 Occlusion and stenosis of bilateral carotid arteries: Secondary | ICD-10-CM | POA: Diagnosis not present

## 2019-01-01 DIAGNOSIS — R410 Disorientation, unspecified: Secondary | ICD-10-CM | POA: Diagnosis not present

## 2019-01-01 DIAGNOSIS — I10 Essential (primary) hypertension: Secondary | ICD-10-CM | POA: Diagnosis not present

## 2019-01-01 DIAGNOSIS — Z515 Encounter for palliative care: Secondary | ICD-10-CM | POA: Diagnosis not present

## 2019-01-01 DIAGNOSIS — I779 Disorder of arteries and arterioles, unspecified: Secondary | ICD-10-CM | POA: Diagnosis not present

## 2019-01-01 DIAGNOSIS — R451 Restlessness and agitation: Secondary | ICD-10-CM | POA: Diagnosis not present

## 2019-01-02 DIAGNOSIS — R634 Abnormal weight loss: Secondary | ICD-10-CM | POA: Diagnosis not present

## 2019-01-02 DIAGNOSIS — I779 Disorder of arteries and arterioles, unspecified: Secondary | ICD-10-CM | POA: Diagnosis not present

## 2019-01-02 DIAGNOSIS — I10 Essential (primary) hypertension: Secondary | ICD-10-CM | POA: Diagnosis not present

## 2019-01-02 DIAGNOSIS — R410 Disorientation, unspecified: Secondary | ICD-10-CM | POA: Diagnosis not present

## 2019-01-02 DIAGNOSIS — Z66 Do not resuscitate: Secondary | ICD-10-CM | POA: Diagnosis not present

## 2019-01-02 DIAGNOSIS — F0280 Dementia in other diseases classified elsewhere without behavioral disturbance: Secondary | ICD-10-CM | POA: Diagnosis not present

## 2019-01-02 DIAGNOSIS — I6523 Occlusion and stenosis of bilateral carotid arteries: Secondary | ICD-10-CM | POA: Diagnosis not present

## 2019-01-02 DIAGNOSIS — R296 Repeated falls: Secondary | ICD-10-CM | POA: Diagnosis not present

## 2019-01-02 DIAGNOSIS — G300 Alzheimer's disease with early onset: Secondary | ICD-10-CM | POA: Diagnosis not present

## 2019-01-02 DIAGNOSIS — Z515 Encounter for palliative care: Secondary | ICD-10-CM | POA: Diagnosis not present

## 2019-01-02 DIAGNOSIS — R451 Restlessness and agitation: Secondary | ICD-10-CM | POA: Diagnosis not present

## 2019-01-03 DIAGNOSIS — Z515 Encounter for palliative care: Secondary | ICD-10-CM | POA: Diagnosis not present

## 2019-01-03 DIAGNOSIS — F0280 Dementia in other diseases classified elsewhere without behavioral disturbance: Secondary | ICD-10-CM | POA: Diagnosis not present

## 2019-01-03 DIAGNOSIS — R296 Repeated falls: Secondary | ICD-10-CM | POA: Diagnosis not present

## 2019-01-03 DIAGNOSIS — R451 Restlessness and agitation: Secondary | ICD-10-CM | POA: Diagnosis not present

## 2019-01-03 DIAGNOSIS — I6523 Occlusion and stenosis of bilateral carotid arteries: Secondary | ICD-10-CM | POA: Diagnosis not present

## 2019-01-03 DIAGNOSIS — I779 Disorder of arteries and arterioles, unspecified: Secondary | ICD-10-CM | POA: Diagnosis not present

## 2019-01-03 DIAGNOSIS — Z66 Do not resuscitate: Secondary | ICD-10-CM | POA: Diagnosis not present

## 2019-01-03 DIAGNOSIS — G300 Alzheimer's disease with early onset: Secondary | ICD-10-CM | POA: Diagnosis not present

## 2019-01-03 DIAGNOSIS — R634 Abnormal weight loss: Secondary | ICD-10-CM | POA: Diagnosis not present

## 2019-01-03 DIAGNOSIS — R410 Disorientation, unspecified: Secondary | ICD-10-CM | POA: Diagnosis not present

## 2019-01-03 DIAGNOSIS — I10 Essential (primary) hypertension: Secondary | ICD-10-CM | POA: Diagnosis not present

## 2019-01-04 DIAGNOSIS — Z66 Do not resuscitate: Secondary | ICD-10-CM | POA: Diagnosis not present

## 2019-01-04 DIAGNOSIS — F0280 Dementia in other diseases classified elsewhere without behavioral disturbance: Secondary | ICD-10-CM | POA: Diagnosis not present

## 2019-01-04 DIAGNOSIS — Z515 Encounter for palliative care: Secondary | ICD-10-CM | POA: Diagnosis not present

## 2019-01-04 DIAGNOSIS — I779 Disorder of arteries and arterioles, unspecified: Secondary | ICD-10-CM | POA: Diagnosis not present

## 2019-01-04 DIAGNOSIS — R410 Disorientation, unspecified: Secondary | ICD-10-CM | POA: Diagnosis not present

## 2019-01-04 DIAGNOSIS — R451 Restlessness and agitation: Secondary | ICD-10-CM | POA: Diagnosis not present

## 2019-01-04 DIAGNOSIS — R634 Abnormal weight loss: Secondary | ICD-10-CM | POA: Diagnosis not present

## 2019-01-04 DIAGNOSIS — I6523 Occlusion and stenosis of bilateral carotid arteries: Secondary | ICD-10-CM | POA: Diagnosis not present

## 2019-01-04 DIAGNOSIS — R296 Repeated falls: Secondary | ICD-10-CM | POA: Diagnosis not present

## 2019-01-04 DIAGNOSIS — I10 Essential (primary) hypertension: Secondary | ICD-10-CM | POA: Diagnosis not present

## 2019-01-04 DIAGNOSIS — G300 Alzheimer's disease with early onset: Secondary | ICD-10-CM | POA: Diagnosis not present

## 2019-01-05 DIAGNOSIS — I779 Disorder of arteries and arterioles, unspecified: Secondary | ICD-10-CM | POA: Diagnosis not present

## 2019-01-05 DIAGNOSIS — Z515 Encounter for palliative care: Secondary | ICD-10-CM | POA: Diagnosis not present

## 2019-01-05 DIAGNOSIS — I10 Essential (primary) hypertension: Secondary | ICD-10-CM | POA: Diagnosis not present

## 2019-01-05 DIAGNOSIS — R451 Restlessness and agitation: Secondary | ICD-10-CM | POA: Diagnosis not present

## 2019-01-05 DIAGNOSIS — R410 Disorientation, unspecified: Secondary | ICD-10-CM | POA: Diagnosis not present

## 2019-01-05 DIAGNOSIS — F0280 Dementia in other diseases classified elsewhere without behavioral disturbance: Secondary | ICD-10-CM | POA: Diagnosis not present

## 2019-01-05 DIAGNOSIS — R296 Repeated falls: Secondary | ICD-10-CM | POA: Diagnosis not present

## 2019-01-05 DIAGNOSIS — G300 Alzheimer's disease with early onset: Secondary | ICD-10-CM | POA: Diagnosis not present

## 2019-01-05 DIAGNOSIS — R634 Abnormal weight loss: Secondary | ICD-10-CM | POA: Diagnosis not present

## 2019-01-05 DIAGNOSIS — I6523 Occlusion and stenosis of bilateral carotid arteries: Secondary | ICD-10-CM | POA: Diagnosis not present

## 2019-01-05 DIAGNOSIS — Z66 Do not resuscitate: Secondary | ICD-10-CM | POA: Diagnosis not present

## 2019-01-06 DIAGNOSIS — I779 Disorder of arteries and arterioles, unspecified: Secondary | ICD-10-CM | POA: Diagnosis not present

## 2019-01-06 DIAGNOSIS — R634 Abnormal weight loss: Secondary | ICD-10-CM | POA: Diagnosis not present

## 2019-01-06 DIAGNOSIS — I6523 Occlusion and stenosis of bilateral carotid arteries: Secondary | ICD-10-CM | POA: Diagnosis not present

## 2019-01-06 DIAGNOSIS — I10 Essential (primary) hypertension: Secondary | ICD-10-CM | POA: Diagnosis not present

## 2019-01-06 DIAGNOSIS — R451 Restlessness and agitation: Secondary | ICD-10-CM | POA: Diagnosis not present

## 2019-01-06 DIAGNOSIS — Z515 Encounter for palliative care: Secondary | ICD-10-CM | POA: Diagnosis not present

## 2019-01-06 DIAGNOSIS — G300 Alzheimer's disease with early onset: Secondary | ICD-10-CM | POA: Diagnosis not present

## 2019-01-06 DIAGNOSIS — R296 Repeated falls: Secondary | ICD-10-CM | POA: Diagnosis not present

## 2019-01-06 DIAGNOSIS — Z66 Do not resuscitate: Secondary | ICD-10-CM | POA: Diagnosis not present

## 2019-01-06 DIAGNOSIS — F0280 Dementia in other diseases classified elsewhere without behavioral disturbance: Secondary | ICD-10-CM | POA: Diagnosis not present

## 2019-01-06 DIAGNOSIS — R410 Disorientation, unspecified: Secondary | ICD-10-CM | POA: Diagnosis not present

## 2019-01-07 DIAGNOSIS — I6523 Occlusion and stenosis of bilateral carotid arteries: Secondary | ICD-10-CM | POA: Diagnosis not present

## 2019-01-07 DIAGNOSIS — R634 Abnormal weight loss: Secondary | ICD-10-CM | POA: Diagnosis not present

## 2019-01-07 DIAGNOSIS — R296 Repeated falls: Secondary | ICD-10-CM | POA: Diagnosis not present

## 2019-01-07 DIAGNOSIS — R410 Disorientation, unspecified: Secondary | ICD-10-CM | POA: Diagnosis not present

## 2019-01-07 DIAGNOSIS — G300 Alzheimer's disease with early onset: Secondary | ICD-10-CM | POA: Diagnosis not present

## 2019-01-07 DIAGNOSIS — I10 Essential (primary) hypertension: Secondary | ICD-10-CM | POA: Diagnosis not present

## 2019-01-07 DIAGNOSIS — Z515 Encounter for palliative care: Secondary | ICD-10-CM | POA: Diagnosis not present

## 2019-01-07 DIAGNOSIS — F0280 Dementia in other diseases classified elsewhere without behavioral disturbance: Secondary | ICD-10-CM | POA: Diagnosis not present

## 2019-01-07 DIAGNOSIS — R451 Restlessness and agitation: Secondary | ICD-10-CM | POA: Diagnosis not present

## 2019-01-07 DIAGNOSIS — I779 Disorder of arteries and arterioles, unspecified: Secondary | ICD-10-CM | POA: Diagnosis not present

## 2019-01-07 DIAGNOSIS — Z66 Do not resuscitate: Secondary | ICD-10-CM | POA: Diagnosis not present

## 2019-01-08 DIAGNOSIS — Z66 Do not resuscitate: Secondary | ICD-10-CM | POA: Diagnosis not present

## 2019-01-08 DIAGNOSIS — F0280 Dementia in other diseases classified elsewhere without behavioral disturbance: Secondary | ICD-10-CM | POA: Diagnosis not present

## 2019-01-08 DIAGNOSIS — I779 Disorder of arteries and arterioles, unspecified: Secondary | ICD-10-CM | POA: Diagnosis not present

## 2019-01-08 DIAGNOSIS — I10 Essential (primary) hypertension: Secondary | ICD-10-CM | POA: Diagnosis not present

## 2019-01-08 DIAGNOSIS — G300 Alzheimer's disease with early onset: Secondary | ICD-10-CM | POA: Diagnosis not present

## 2019-01-08 DIAGNOSIS — Z515 Encounter for palliative care: Secondary | ICD-10-CM | POA: Diagnosis not present

## 2019-01-08 DIAGNOSIS — R451 Restlessness and agitation: Secondary | ICD-10-CM | POA: Diagnosis not present

## 2019-01-08 DIAGNOSIS — R634 Abnormal weight loss: Secondary | ICD-10-CM | POA: Diagnosis not present

## 2019-01-08 DIAGNOSIS — R410 Disorientation, unspecified: Secondary | ICD-10-CM | POA: Diagnosis not present

## 2019-01-08 DIAGNOSIS — I6523 Occlusion and stenosis of bilateral carotid arteries: Secondary | ICD-10-CM | POA: Diagnosis not present

## 2019-01-08 DIAGNOSIS — R296 Repeated falls: Secondary | ICD-10-CM | POA: Diagnosis not present

## 2019-01-09 DIAGNOSIS — R627 Adult failure to thrive: Secondary | ICD-10-CM | POA: Diagnosis not present

## 2019-01-09 DIAGNOSIS — R54 Age-related physical debility: Secondary | ICD-10-CM | POA: Diagnosis not present

## 2019-01-09 DIAGNOSIS — I10 Essential (primary) hypertension: Secondary | ICD-10-CM | POA: Diagnosis not present

## 2019-01-09 DIAGNOSIS — Z66 Do not resuscitate: Secondary | ICD-10-CM | POA: Diagnosis not present

## 2019-01-09 DIAGNOSIS — R410 Disorientation, unspecified: Secondary | ICD-10-CM | POA: Diagnosis not present

## 2019-01-09 DIAGNOSIS — Z515 Encounter for palliative care: Secondary | ICD-10-CM | POA: Diagnosis not present

## 2019-01-09 DIAGNOSIS — I779 Disorder of arteries and arterioles, unspecified: Secondary | ICD-10-CM | POA: Diagnosis not present

## 2019-01-09 DIAGNOSIS — Z7409 Other reduced mobility: Secondary | ICD-10-CM | POA: Diagnosis not present

## 2019-01-09 DIAGNOSIS — R451 Restlessness and agitation: Secondary | ICD-10-CM | POA: Diagnosis not present

## 2019-01-09 DIAGNOSIS — I6523 Occlusion and stenosis of bilateral carotid arteries: Secondary | ICD-10-CM | POA: Diagnosis not present

## 2019-01-09 DIAGNOSIS — F0280 Dementia in other diseases classified elsewhere without behavioral disturbance: Secondary | ICD-10-CM | POA: Diagnosis not present

## 2019-01-09 DIAGNOSIS — R32 Unspecified urinary incontinence: Secondary | ICD-10-CM | POA: Diagnosis not present

## 2019-01-09 DIAGNOSIS — R296 Repeated falls: Secondary | ICD-10-CM | POA: Diagnosis not present

## 2019-01-09 DIAGNOSIS — R634 Abnormal weight loss: Secondary | ICD-10-CM | POA: Diagnosis not present

## 2019-01-09 DIAGNOSIS — G300 Alzheimer's disease with early onset: Secondary | ICD-10-CM | POA: Diagnosis not present

## 2019-01-10 DIAGNOSIS — R296 Repeated falls: Secondary | ICD-10-CM | POA: Diagnosis not present

## 2019-01-10 DIAGNOSIS — Z515 Encounter for palliative care: Secondary | ICD-10-CM | POA: Diagnosis not present

## 2019-01-10 DIAGNOSIS — F0280 Dementia in other diseases classified elsewhere without behavioral disturbance: Secondary | ICD-10-CM | POA: Diagnosis not present

## 2019-01-10 DIAGNOSIS — R451 Restlessness and agitation: Secondary | ICD-10-CM | POA: Diagnosis not present

## 2019-01-10 DIAGNOSIS — G300 Alzheimer's disease with early onset: Secondary | ICD-10-CM | POA: Diagnosis not present

## 2019-01-10 DIAGNOSIS — R634 Abnormal weight loss: Secondary | ICD-10-CM | POA: Diagnosis not present

## 2019-01-10 DIAGNOSIS — I779 Disorder of arteries and arterioles, unspecified: Secondary | ICD-10-CM | POA: Diagnosis not present

## 2019-01-10 DIAGNOSIS — I10 Essential (primary) hypertension: Secondary | ICD-10-CM | POA: Diagnosis not present

## 2019-01-10 DIAGNOSIS — Z66 Do not resuscitate: Secondary | ICD-10-CM | POA: Diagnosis not present

## 2019-01-10 DIAGNOSIS — I6523 Occlusion and stenosis of bilateral carotid arteries: Secondary | ICD-10-CM | POA: Diagnosis not present

## 2019-01-10 DIAGNOSIS — R410 Disorientation, unspecified: Secondary | ICD-10-CM | POA: Diagnosis not present

## 2019-01-11 DIAGNOSIS — R296 Repeated falls: Secondary | ICD-10-CM | POA: Diagnosis not present

## 2019-01-11 DIAGNOSIS — Z66 Do not resuscitate: Secondary | ICD-10-CM | POA: Diagnosis not present

## 2019-01-11 DIAGNOSIS — R451 Restlessness and agitation: Secondary | ICD-10-CM | POA: Diagnosis not present

## 2019-01-11 DIAGNOSIS — Z515 Encounter for palliative care: Secondary | ICD-10-CM | POA: Diagnosis not present

## 2019-01-11 DIAGNOSIS — I10 Essential (primary) hypertension: Secondary | ICD-10-CM | POA: Diagnosis not present

## 2019-01-11 DIAGNOSIS — G300 Alzheimer's disease with early onset: Secondary | ICD-10-CM | POA: Diagnosis not present

## 2019-01-11 DIAGNOSIS — F0280 Dementia in other diseases classified elsewhere without behavioral disturbance: Secondary | ICD-10-CM | POA: Diagnosis not present

## 2019-01-11 DIAGNOSIS — I779 Disorder of arteries and arterioles, unspecified: Secondary | ICD-10-CM | POA: Diagnosis not present

## 2019-01-11 DIAGNOSIS — I6523 Occlusion and stenosis of bilateral carotid arteries: Secondary | ICD-10-CM | POA: Diagnosis not present

## 2019-01-11 DIAGNOSIS — R410 Disorientation, unspecified: Secondary | ICD-10-CM | POA: Diagnosis not present

## 2019-01-11 DIAGNOSIS — R634 Abnormal weight loss: Secondary | ICD-10-CM | POA: Diagnosis not present

## 2019-01-12 DIAGNOSIS — I10 Essential (primary) hypertension: Secondary | ICD-10-CM | POA: Diagnosis not present

## 2019-01-12 DIAGNOSIS — R296 Repeated falls: Secondary | ICD-10-CM | POA: Diagnosis not present

## 2019-01-12 DIAGNOSIS — Z66 Do not resuscitate: Secondary | ICD-10-CM | POA: Diagnosis not present

## 2019-01-12 DIAGNOSIS — G300 Alzheimer's disease with early onset: Secondary | ICD-10-CM | POA: Diagnosis not present

## 2019-01-12 DIAGNOSIS — F0280 Dementia in other diseases classified elsewhere without behavioral disturbance: Secondary | ICD-10-CM | POA: Diagnosis not present

## 2019-01-12 DIAGNOSIS — Z515 Encounter for palliative care: Secondary | ICD-10-CM | POA: Diagnosis not present

## 2019-01-12 DIAGNOSIS — I6523 Occlusion and stenosis of bilateral carotid arteries: Secondary | ICD-10-CM | POA: Diagnosis not present

## 2019-01-12 DIAGNOSIS — I779 Disorder of arteries and arterioles, unspecified: Secondary | ICD-10-CM | POA: Diagnosis not present

## 2019-01-12 DIAGNOSIS — R634 Abnormal weight loss: Secondary | ICD-10-CM | POA: Diagnosis not present

## 2019-01-12 DIAGNOSIS — R451 Restlessness and agitation: Secondary | ICD-10-CM | POA: Diagnosis not present

## 2019-01-12 DIAGNOSIS — R410 Disorientation, unspecified: Secondary | ICD-10-CM | POA: Diagnosis not present

## 2019-01-13 DIAGNOSIS — Z515 Encounter for palliative care: Secondary | ICD-10-CM | POA: Diagnosis not present

## 2019-01-13 DIAGNOSIS — R296 Repeated falls: Secondary | ICD-10-CM | POA: Diagnosis not present

## 2019-01-13 DIAGNOSIS — I6523 Occlusion and stenosis of bilateral carotid arteries: Secondary | ICD-10-CM | POA: Diagnosis not present

## 2019-01-13 DIAGNOSIS — R634 Abnormal weight loss: Secondary | ICD-10-CM | POA: Diagnosis not present

## 2019-01-13 DIAGNOSIS — R451 Restlessness and agitation: Secondary | ICD-10-CM | POA: Diagnosis not present

## 2019-01-13 DIAGNOSIS — I779 Disorder of arteries and arterioles, unspecified: Secondary | ICD-10-CM | POA: Diagnosis not present

## 2019-01-13 DIAGNOSIS — F0280 Dementia in other diseases classified elsewhere without behavioral disturbance: Secondary | ICD-10-CM | POA: Diagnosis not present

## 2019-01-13 DIAGNOSIS — Z66 Do not resuscitate: Secondary | ICD-10-CM | POA: Diagnosis not present

## 2019-01-13 DIAGNOSIS — G300 Alzheimer's disease with early onset: Secondary | ICD-10-CM | POA: Diagnosis not present

## 2019-01-13 DIAGNOSIS — R410 Disorientation, unspecified: Secondary | ICD-10-CM | POA: Diagnosis not present

## 2019-01-13 DIAGNOSIS — I10 Essential (primary) hypertension: Secondary | ICD-10-CM | POA: Diagnosis not present

## 2019-01-14 DIAGNOSIS — R451 Restlessness and agitation: Secondary | ICD-10-CM | POA: Diagnosis not present

## 2019-01-14 DIAGNOSIS — R410 Disorientation, unspecified: Secondary | ICD-10-CM | POA: Diagnosis not present

## 2019-01-14 DIAGNOSIS — I6523 Occlusion and stenosis of bilateral carotid arteries: Secondary | ICD-10-CM | POA: Diagnosis not present

## 2019-01-14 DIAGNOSIS — F0280 Dementia in other diseases classified elsewhere without behavioral disturbance: Secondary | ICD-10-CM | POA: Diagnosis not present

## 2019-01-14 DIAGNOSIS — R634 Abnormal weight loss: Secondary | ICD-10-CM | POA: Diagnosis not present

## 2019-01-14 DIAGNOSIS — Z515 Encounter for palliative care: Secondary | ICD-10-CM | POA: Diagnosis not present

## 2019-01-14 DIAGNOSIS — R296 Repeated falls: Secondary | ICD-10-CM | POA: Diagnosis not present

## 2019-01-14 DIAGNOSIS — I10 Essential (primary) hypertension: Secondary | ICD-10-CM | POA: Diagnosis not present

## 2019-01-14 DIAGNOSIS — G300 Alzheimer's disease with early onset: Secondary | ICD-10-CM | POA: Diagnosis not present

## 2019-01-14 DIAGNOSIS — Z66 Do not resuscitate: Secondary | ICD-10-CM | POA: Diagnosis not present

## 2019-01-14 DIAGNOSIS — I779 Disorder of arteries and arterioles, unspecified: Secondary | ICD-10-CM | POA: Diagnosis not present

## 2019-01-15 DIAGNOSIS — R296 Repeated falls: Secondary | ICD-10-CM | POA: Diagnosis not present

## 2019-01-15 DIAGNOSIS — E46 Unspecified protein-calorie malnutrition: Secondary | ICD-10-CM | POA: Diagnosis not present

## 2019-01-15 DIAGNOSIS — Z515 Encounter for palliative care: Secondary | ICD-10-CM | POA: Diagnosis not present

## 2019-01-15 DIAGNOSIS — F0280 Dementia in other diseases classified elsewhere without behavioral disturbance: Secondary | ICD-10-CM | POA: Diagnosis not present

## 2019-01-15 DIAGNOSIS — I779 Disorder of arteries and arterioles, unspecified: Secondary | ICD-10-CM | POA: Diagnosis not present

## 2019-01-15 DIAGNOSIS — R634 Abnormal weight loss: Secondary | ICD-10-CM | POA: Diagnosis not present

## 2019-01-15 DIAGNOSIS — R54 Age-related physical debility: Secondary | ICD-10-CM | POA: Diagnosis not present

## 2019-01-15 DIAGNOSIS — R627 Adult failure to thrive: Secondary | ICD-10-CM | POA: Diagnosis not present

## 2019-01-15 DIAGNOSIS — I6523 Occlusion and stenosis of bilateral carotid arteries: Secondary | ICD-10-CM | POA: Diagnosis not present

## 2019-01-15 DIAGNOSIS — R451 Restlessness and agitation: Secondary | ICD-10-CM | POA: Diagnosis not present

## 2019-01-15 DIAGNOSIS — R410 Disorientation, unspecified: Secondary | ICD-10-CM | POA: Diagnosis not present

## 2019-01-15 DIAGNOSIS — R32 Unspecified urinary incontinence: Secondary | ICD-10-CM | POA: Diagnosis not present

## 2019-01-15 DIAGNOSIS — I10 Essential (primary) hypertension: Secondary | ICD-10-CM | POA: Diagnosis not present

## 2019-01-15 DIAGNOSIS — G300 Alzheimer's disease with early onset: Secondary | ICD-10-CM | POA: Diagnosis not present

## 2019-01-15 DIAGNOSIS — Z66 Do not resuscitate: Secondary | ICD-10-CM | POA: Diagnosis not present

## 2019-01-16 DIAGNOSIS — I10 Essential (primary) hypertension: Secondary | ICD-10-CM | POA: Diagnosis not present

## 2019-01-16 DIAGNOSIS — R410 Disorientation, unspecified: Secondary | ICD-10-CM | POA: Diagnosis not present

## 2019-01-16 DIAGNOSIS — R296 Repeated falls: Secondary | ICD-10-CM | POA: Diagnosis not present

## 2019-01-16 DIAGNOSIS — Z515 Encounter for palliative care: Secondary | ICD-10-CM | POA: Diagnosis not present

## 2019-01-16 DIAGNOSIS — R451 Restlessness and agitation: Secondary | ICD-10-CM | POA: Diagnosis not present

## 2019-01-16 DIAGNOSIS — Z66 Do not resuscitate: Secondary | ICD-10-CM | POA: Diagnosis not present

## 2019-01-16 DIAGNOSIS — I779 Disorder of arteries and arterioles, unspecified: Secondary | ICD-10-CM | POA: Diagnosis not present

## 2019-01-16 DIAGNOSIS — I6523 Occlusion and stenosis of bilateral carotid arteries: Secondary | ICD-10-CM | POA: Diagnosis not present

## 2019-01-16 DIAGNOSIS — G300 Alzheimer's disease with early onset: Secondary | ICD-10-CM | POA: Diagnosis not present

## 2019-01-16 DIAGNOSIS — R634 Abnormal weight loss: Secondary | ICD-10-CM | POA: Diagnosis not present

## 2019-01-16 DIAGNOSIS — F0280 Dementia in other diseases classified elsewhere without behavioral disturbance: Secondary | ICD-10-CM | POA: Diagnosis not present

## 2019-01-17 DIAGNOSIS — R410 Disorientation, unspecified: Secondary | ICD-10-CM | POA: Diagnosis not present

## 2019-01-17 DIAGNOSIS — I10 Essential (primary) hypertension: Secondary | ICD-10-CM | POA: Diagnosis not present

## 2019-01-17 DIAGNOSIS — F0280 Dementia in other diseases classified elsewhere without behavioral disturbance: Secondary | ICD-10-CM | POA: Diagnosis not present

## 2019-01-17 DIAGNOSIS — Z515 Encounter for palliative care: Secondary | ICD-10-CM | POA: Diagnosis not present

## 2019-01-17 DIAGNOSIS — R634 Abnormal weight loss: Secondary | ICD-10-CM | POA: Diagnosis not present

## 2019-01-17 DIAGNOSIS — G300 Alzheimer's disease with early onset: Secondary | ICD-10-CM | POA: Diagnosis not present

## 2019-01-17 DIAGNOSIS — Z66 Do not resuscitate: Secondary | ICD-10-CM | POA: Diagnosis not present

## 2019-01-17 DIAGNOSIS — R451 Restlessness and agitation: Secondary | ICD-10-CM | POA: Diagnosis not present

## 2019-01-17 DIAGNOSIS — I779 Disorder of arteries and arterioles, unspecified: Secondary | ICD-10-CM | POA: Diagnosis not present

## 2019-01-17 DIAGNOSIS — R296 Repeated falls: Secondary | ICD-10-CM | POA: Diagnosis not present

## 2019-01-17 DIAGNOSIS — I6523 Occlusion and stenosis of bilateral carotid arteries: Secondary | ICD-10-CM | POA: Diagnosis not present

## 2019-01-18 DIAGNOSIS — R296 Repeated falls: Secondary | ICD-10-CM | POA: Diagnosis not present

## 2019-01-18 DIAGNOSIS — R634 Abnormal weight loss: Secondary | ICD-10-CM | POA: Diagnosis not present

## 2019-01-18 DIAGNOSIS — I10 Essential (primary) hypertension: Secondary | ICD-10-CM | POA: Diagnosis not present

## 2019-01-18 DIAGNOSIS — F0280 Dementia in other diseases classified elsewhere without behavioral disturbance: Secondary | ICD-10-CM | POA: Diagnosis not present

## 2019-01-18 DIAGNOSIS — Z66 Do not resuscitate: Secondary | ICD-10-CM | POA: Diagnosis not present

## 2019-01-18 DIAGNOSIS — I779 Disorder of arteries and arterioles, unspecified: Secondary | ICD-10-CM | POA: Diagnosis not present

## 2019-01-18 DIAGNOSIS — R410 Disorientation, unspecified: Secondary | ICD-10-CM | POA: Diagnosis not present

## 2019-01-18 DIAGNOSIS — R451 Restlessness and agitation: Secondary | ICD-10-CM | POA: Diagnosis not present

## 2019-01-18 DIAGNOSIS — G300 Alzheimer's disease with early onset: Secondary | ICD-10-CM | POA: Diagnosis not present

## 2019-01-18 DIAGNOSIS — Z515 Encounter for palliative care: Secondary | ICD-10-CM | POA: Diagnosis not present

## 2019-01-18 DIAGNOSIS — I6523 Occlusion and stenosis of bilateral carotid arteries: Secondary | ICD-10-CM | POA: Diagnosis not present

## 2019-01-19 DIAGNOSIS — R296 Repeated falls: Secondary | ICD-10-CM | POA: Diagnosis not present

## 2019-01-19 DIAGNOSIS — R410 Disorientation, unspecified: Secondary | ICD-10-CM | POA: Diagnosis not present

## 2019-01-19 DIAGNOSIS — I6523 Occlusion and stenosis of bilateral carotid arteries: Secondary | ICD-10-CM | POA: Diagnosis not present

## 2019-01-19 DIAGNOSIS — Z515 Encounter for palliative care: Secondary | ICD-10-CM | POA: Diagnosis not present

## 2019-01-19 DIAGNOSIS — R451 Restlessness and agitation: Secondary | ICD-10-CM | POA: Diagnosis not present

## 2019-01-19 DIAGNOSIS — Z66 Do not resuscitate: Secondary | ICD-10-CM | POA: Diagnosis not present

## 2019-01-19 DIAGNOSIS — R634 Abnormal weight loss: Secondary | ICD-10-CM | POA: Diagnosis not present

## 2019-01-19 DIAGNOSIS — I10 Essential (primary) hypertension: Secondary | ICD-10-CM | POA: Diagnosis not present

## 2019-01-19 DIAGNOSIS — I779 Disorder of arteries and arterioles, unspecified: Secondary | ICD-10-CM | POA: Diagnosis not present

## 2019-01-19 DIAGNOSIS — G300 Alzheimer's disease with early onset: Secondary | ICD-10-CM | POA: Diagnosis not present

## 2019-01-19 DIAGNOSIS — F0280 Dementia in other diseases classified elsewhere without behavioral disturbance: Secondary | ICD-10-CM | POA: Diagnosis not present

## 2019-01-20 DIAGNOSIS — R451 Restlessness and agitation: Secondary | ICD-10-CM | POA: Diagnosis not present

## 2019-01-20 DIAGNOSIS — I779 Disorder of arteries and arterioles, unspecified: Secondary | ICD-10-CM | POA: Diagnosis not present

## 2019-01-20 DIAGNOSIS — G300 Alzheimer's disease with early onset: Secondary | ICD-10-CM | POA: Diagnosis not present

## 2019-01-20 DIAGNOSIS — Z515 Encounter for palliative care: Secondary | ICD-10-CM | POA: Diagnosis not present

## 2019-01-20 DIAGNOSIS — I6523 Occlusion and stenosis of bilateral carotid arteries: Secondary | ICD-10-CM | POA: Diagnosis not present

## 2019-01-20 DIAGNOSIS — R296 Repeated falls: Secondary | ICD-10-CM | POA: Diagnosis not present

## 2019-01-20 DIAGNOSIS — R634 Abnormal weight loss: Secondary | ICD-10-CM | POA: Diagnosis not present

## 2019-01-20 DIAGNOSIS — Z66 Do not resuscitate: Secondary | ICD-10-CM | POA: Diagnosis not present

## 2019-01-20 DIAGNOSIS — I10 Essential (primary) hypertension: Secondary | ICD-10-CM | POA: Diagnosis not present

## 2019-01-20 DIAGNOSIS — R410 Disorientation, unspecified: Secondary | ICD-10-CM | POA: Diagnosis not present

## 2019-01-20 DIAGNOSIS — F0280 Dementia in other diseases classified elsewhere without behavioral disturbance: Secondary | ICD-10-CM | POA: Diagnosis not present

## 2019-01-21 DIAGNOSIS — R410 Disorientation, unspecified: Secondary | ICD-10-CM | POA: Diagnosis not present

## 2019-01-21 DIAGNOSIS — I10 Essential (primary) hypertension: Secondary | ICD-10-CM | POA: Diagnosis not present

## 2019-01-21 DIAGNOSIS — F0280 Dementia in other diseases classified elsewhere without behavioral disturbance: Secondary | ICD-10-CM | POA: Diagnosis not present

## 2019-01-21 DIAGNOSIS — R451 Restlessness and agitation: Secondary | ICD-10-CM | POA: Diagnosis not present

## 2019-01-21 DIAGNOSIS — I779 Disorder of arteries and arterioles, unspecified: Secondary | ICD-10-CM | POA: Diagnosis not present

## 2019-01-21 DIAGNOSIS — R296 Repeated falls: Secondary | ICD-10-CM | POA: Diagnosis not present

## 2019-01-21 DIAGNOSIS — Z515 Encounter for palliative care: Secondary | ICD-10-CM | POA: Diagnosis not present

## 2019-01-21 DIAGNOSIS — Z66 Do not resuscitate: Secondary | ICD-10-CM | POA: Diagnosis not present

## 2019-01-21 DIAGNOSIS — R634 Abnormal weight loss: Secondary | ICD-10-CM | POA: Diagnosis not present

## 2019-01-21 DIAGNOSIS — G300 Alzheimer's disease with early onset: Secondary | ICD-10-CM | POA: Diagnosis not present

## 2019-01-21 DIAGNOSIS — I6523 Occlusion and stenosis of bilateral carotid arteries: Secondary | ICD-10-CM | POA: Diagnosis not present

## 2019-01-22 DIAGNOSIS — R410 Disorientation, unspecified: Secondary | ICD-10-CM | POA: Diagnosis not present

## 2019-01-22 DIAGNOSIS — R451 Restlessness and agitation: Secondary | ICD-10-CM | POA: Diagnosis not present

## 2019-01-22 DIAGNOSIS — I779 Disorder of arteries and arterioles, unspecified: Secondary | ICD-10-CM | POA: Diagnosis not present

## 2019-01-22 DIAGNOSIS — G300 Alzheimer's disease with early onset: Secondary | ICD-10-CM | POA: Diagnosis not present

## 2019-01-22 DIAGNOSIS — F0280 Dementia in other diseases classified elsewhere without behavioral disturbance: Secondary | ICD-10-CM | POA: Diagnosis not present

## 2019-01-22 DIAGNOSIS — Z66 Do not resuscitate: Secondary | ICD-10-CM | POA: Diagnosis not present

## 2019-01-22 DIAGNOSIS — Z515 Encounter for palliative care: Secondary | ICD-10-CM | POA: Diagnosis not present

## 2019-01-22 DIAGNOSIS — I6523 Occlusion and stenosis of bilateral carotid arteries: Secondary | ICD-10-CM | POA: Diagnosis not present

## 2019-01-22 DIAGNOSIS — R634 Abnormal weight loss: Secondary | ICD-10-CM | POA: Diagnosis not present

## 2019-01-22 DIAGNOSIS — R296 Repeated falls: Secondary | ICD-10-CM | POA: Diagnosis not present

## 2019-01-22 DIAGNOSIS — I10 Essential (primary) hypertension: Secondary | ICD-10-CM | POA: Diagnosis not present

## 2019-01-23 DIAGNOSIS — I6523 Occlusion and stenosis of bilateral carotid arteries: Secondary | ICD-10-CM | POA: Diagnosis not present

## 2019-01-23 DIAGNOSIS — Z66 Do not resuscitate: Secondary | ICD-10-CM | POA: Diagnosis not present

## 2019-01-23 DIAGNOSIS — R296 Repeated falls: Secondary | ICD-10-CM | POA: Diagnosis not present

## 2019-01-23 DIAGNOSIS — G300 Alzheimer's disease with early onset: Secondary | ICD-10-CM | POA: Diagnosis not present

## 2019-01-23 DIAGNOSIS — R634 Abnormal weight loss: Secondary | ICD-10-CM | POA: Diagnosis not present

## 2019-01-23 DIAGNOSIS — R410 Disorientation, unspecified: Secondary | ICD-10-CM | POA: Diagnosis not present

## 2019-01-23 DIAGNOSIS — Z515 Encounter for palliative care: Secondary | ICD-10-CM | POA: Diagnosis not present

## 2019-01-23 DIAGNOSIS — I10 Essential (primary) hypertension: Secondary | ICD-10-CM | POA: Diagnosis not present

## 2019-01-23 DIAGNOSIS — I779 Disorder of arteries and arterioles, unspecified: Secondary | ICD-10-CM | POA: Diagnosis not present

## 2019-01-23 DIAGNOSIS — R451 Restlessness and agitation: Secondary | ICD-10-CM | POA: Diagnosis not present

## 2019-01-23 DIAGNOSIS — F0280 Dementia in other diseases classified elsewhere without behavioral disturbance: Secondary | ICD-10-CM | POA: Diagnosis not present

## 2019-01-24 DIAGNOSIS — I779 Disorder of arteries and arterioles, unspecified: Secondary | ICD-10-CM | POA: Diagnosis not present

## 2019-01-24 DIAGNOSIS — Z515 Encounter for palliative care: Secondary | ICD-10-CM | POA: Diagnosis not present

## 2019-01-24 DIAGNOSIS — I10 Essential (primary) hypertension: Secondary | ICD-10-CM | POA: Diagnosis not present

## 2019-01-24 DIAGNOSIS — R410 Disorientation, unspecified: Secondary | ICD-10-CM | POA: Diagnosis not present

## 2019-01-24 DIAGNOSIS — Z66 Do not resuscitate: Secondary | ICD-10-CM | POA: Diagnosis not present

## 2019-01-24 DIAGNOSIS — R451 Restlessness and agitation: Secondary | ICD-10-CM | POA: Diagnosis not present

## 2019-01-24 DIAGNOSIS — F0280 Dementia in other diseases classified elsewhere without behavioral disturbance: Secondary | ICD-10-CM | POA: Diagnosis not present

## 2019-01-24 DIAGNOSIS — R634 Abnormal weight loss: Secondary | ICD-10-CM | POA: Diagnosis not present

## 2019-01-24 DIAGNOSIS — I6523 Occlusion and stenosis of bilateral carotid arteries: Secondary | ICD-10-CM | POA: Diagnosis not present

## 2019-01-24 DIAGNOSIS — R296 Repeated falls: Secondary | ICD-10-CM | POA: Diagnosis not present

## 2019-01-24 DIAGNOSIS — G300 Alzheimer's disease with early onset: Secondary | ICD-10-CM | POA: Diagnosis not present

## 2019-01-25 DIAGNOSIS — R410 Disorientation, unspecified: Secondary | ICD-10-CM | POA: Diagnosis not present

## 2019-01-25 DIAGNOSIS — I10 Essential (primary) hypertension: Secondary | ICD-10-CM | POA: Diagnosis not present

## 2019-01-25 DIAGNOSIS — R451 Restlessness and agitation: Secondary | ICD-10-CM | POA: Diagnosis not present

## 2019-01-25 DIAGNOSIS — Z66 Do not resuscitate: Secondary | ICD-10-CM | POA: Diagnosis not present

## 2019-01-25 DIAGNOSIS — Z515 Encounter for palliative care: Secondary | ICD-10-CM | POA: Diagnosis not present

## 2019-01-25 DIAGNOSIS — I6523 Occlusion and stenosis of bilateral carotid arteries: Secondary | ICD-10-CM | POA: Diagnosis not present

## 2019-01-25 DIAGNOSIS — F0280 Dementia in other diseases classified elsewhere without behavioral disturbance: Secondary | ICD-10-CM | POA: Diagnosis not present

## 2019-01-25 DIAGNOSIS — R296 Repeated falls: Secondary | ICD-10-CM | POA: Diagnosis not present

## 2019-01-25 DIAGNOSIS — R634 Abnormal weight loss: Secondary | ICD-10-CM | POA: Diagnosis not present

## 2019-01-25 DIAGNOSIS — G300 Alzheimer's disease with early onset: Secondary | ICD-10-CM | POA: Diagnosis not present

## 2019-01-25 DIAGNOSIS — I779 Disorder of arteries and arterioles, unspecified: Secondary | ICD-10-CM | POA: Diagnosis not present

## 2019-01-26 DIAGNOSIS — R451 Restlessness and agitation: Secondary | ICD-10-CM | POA: Diagnosis not present

## 2019-01-26 DIAGNOSIS — Z515 Encounter for palliative care: Secondary | ICD-10-CM | POA: Diagnosis not present

## 2019-01-26 DIAGNOSIS — R296 Repeated falls: Secondary | ICD-10-CM | POA: Diagnosis not present

## 2019-01-26 DIAGNOSIS — Z66 Do not resuscitate: Secondary | ICD-10-CM | POA: Diagnosis not present

## 2019-01-26 DIAGNOSIS — G300 Alzheimer's disease with early onset: Secondary | ICD-10-CM | POA: Diagnosis not present

## 2019-01-26 DIAGNOSIS — I779 Disorder of arteries and arterioles, unspecified: Secondary | ICD-10-CM | POA: Diagnosis not present

## 2019-01-26 DIAGNOSIS — I6523 Occlusion and stenosis of bilateral carotid arteries: Secondary | ICD-10-CM | POA: Diagnosis not present

## 2019-01-26 DIAGNOSIS — R410 Disorientation, unspecified: Secondary | ICD-10-CM | POA: Diagnosis not present

## 2019-01-26 DIAGNOSIS — I10 Essential (primary) hypertension: Secondary | ICD-10-CM | POA: Diagnosis not present

## 2019-01-26 DIAGNOSIS — F0280 Dementia in other diseases classified elsewhere without behavioral disturbance: Secondary | ICD-10-CM | POA: Diagnosis not present

## 2019-01-26 DIAGNOSIS — R634 Abnormal weight loss: Secondary | ICD-10-CM | POA: Diagnosis not present

## 2019-01-27 DIAGNOSIS — R634 Abnormal weight loss: Secondary | ICD-10-CM | POA: Diagnosis not present

## 2019-01-27 DIAGNOSIS — R410 Disorientation, unspecified: Secondary | ICD-10-CM | POA: Diagnosis not present

## 2019-01-27 DIAGNOSIS — I10 Essential (primary) hypertension: Secondary | ICD-10-CM | POA: Diagnosis not present

## 2019-01-27 DIAGNOSIS — I779 Disorder of arteries and arterioles, unspecified: Secondary | ICD-10-CM | POA: Diagnosis not present

## 2019-01-27 DIAGNOSIS — Z66 Do not resuscitate: Secondary | ICD-10-CM | POA: Diagnosis not present

## 2019-01-27 DIAGNOSIS — F0280 Dementia in other diseases classified elsewhere without behavioral disturbance: Secondary | ICD-10-CM | POA: Diagnosis not present

## 2019-01-27 DIAGNOSIS — R451 Restlessness and agitation: Secondary | ICD-10-CM | POA: Diagnosis not present

## 2019-01-27 DIAGNOSIS — I6523 Occlusion and stenosis of bilateral carotid arteries: Secondary | ICD-10-CM | POA: Diagnosis not present

## 2019-01-27 DIAGNOSIS — G300 Alzheimer's disease with early onset: Secondary | ICD-10-CM | POA: Diagnosis not present

## 2019-01-27 DIAGNOSIS — R296 Repeated falls: Secondary | ICD-10-CM | POA: Diagnosis not present

## 2019-01-27 DIAGNOSIS — Z515 Encounter for palliative care: Secondary | ICD-10-CM | POA: Diagnosis not present

## 2019-01-28 DIAGNOSIS — Z515 Encounter for palliative care: Secondary | ICD-10-CM | POA: Diagnosis not present

## 2019-01-28 DIAGNOSIS — R451 Restlessness and agitation: Secondary | ICD-10-CM | POA: Diagnosis not present

## 2019-01-28 DIAGNOSIS — R410 Disorientation, unspecified: Secondary | ICD-10-CM | POA: Diagnosis not present

## 2019-01-28 DIAGNOSIS — F0280 Dementia in other diseases classified elsewhere without behavioral disturbance: Secondary | ICD-10-CM | POA: Diagnosis not present

## 2019-01-28 DIAGNOSIS — G300 Alzheimer's disease with early onset: Secondary | ICD-10-CM | POA: Diagnosis not present

## 2019-01-28 DIAGNOSIS — I10 Essential (primary) hypertension: Secondary | ICD-10-CM | POA: Diagnosis not present

## 2019-01-28 DIAGNOSIS — R296 Repeated falls: Secondary | ICD-10-CM | POA: Diagnosis not present

## 2019-01-28 DIAGNOSIS — Z66 Do not resuscitate: Secondary | ICD-10-CM | POA: Diagnosis not present

## 2019-01-28 DIAGNOSIS — R634 Abnormal weight loss: Secondary | ICD-10-CM | POA: Diagnosis not present

## 2019-01-28 DIAGNOSIS — I779 Disorder of arteries and arterioles, unspecified: Secondary | ICD-10-CM | POA: Diagnosis not present

## 2019-01-28 DIAGNOSIS — I6523 Occlusion and stenosis of bilateral carotid arteries: Secondary | ICD-10-CM | POA: Diagnosis not present

## 2019-01-29 DIAGNOSIS — R296 Repeated falls: Secondary | ICD-10-CM | POA: Diagnosis not present

## 2019-01-29 DIAGNOSIS — R410 Disorientation, unspecified: Secondary | ICD-10-CM | POA: Diagnosis not present

## 2019-01-29 DIAGNOSIS — R634 Abnormal weight loss: Secondary | ICD-10-CM | POA: Diagnosis not present

## 2019-01-29 DIAGNOSIS — R451 Restlessness and agitation: Secondary | ICD-10-CM | POA: Diagnosis not present

## 2019-01-29 DIAGNOSIS — I10 Essential (primary) hypertension: Secondary | ICD-10-CM | POA: Diagnosis not present

## 2019-01-29 DIAGNOSIS — Z66 Do not resuscitate: Secondary | ICD-10-CM | POA: Diagnosis not present

## 2019-01-29 DIAGNOSIS — I6523 Occlusion and stenosis of bilateral carotid arteries: Secondary | ICD-10-CM | POA: Diagnosis not present

## 2019-01-29 DIAGNOSIS — I779 Disorder of arteries and arterioles, unspecified: Secondary | ICD-10-CM | POA: Diagnosis not present

## 2019-01-29 DIAGNOSIS — F0280 Dementia in other diseases classified elsewhere without behavioral disturbance: Secondary | ICD-10-CM | POA: Diagnosis not present

## 2019-01-29 DIAGNOSIS — Z515 Encounter for palliative care: Secondary | ICD-10-CM | POA: Diagnosis not present

## 2019-01-29 DIAGNOSIS — G300 Alzheimer's disease with early onset: Secondary | ICD-10-CM | POA: Diagnosis not present

## 2019-01-30 DIAGNOSIS — R634 Abnormal weight loss: Secondary | ICD-10-CM | POA: Diagnosis not present

## 2019-01-30 DIAGNOSIS — G300 Alzheimer's disease with early onset: Secondary | ICD-10-CM | POA: Diagnosis not present

## 2019-01-30 DIAGNOSIS — Z66 Do not resuscitate: Secondary | ICD-10-CM | POA: Diagnosis not present

## 2019-01-30 DIAGNOSIS — I10 Essential (primary) hypertension: Secondary | ICD-10-CM | POA: Diagnosis not present

## 2019-01-30 DIAGNOSIS — I6523 Occlusion and stenosis of bilateral carotid arteries: Secondary | ICD-10-CM | POA: Diagnosis not present

## 2019-01-30 DIAGNOSIS — R451 Restlessness and agitation: Secondary | ICD-10-CM | POA: Diagnosis not present

## 2019-01-30 DIAGNOSIS — R296 Repeated falls: Secondary | ICD-10-CM | POA: Diagnosis not present

## 2019-01-30 DIAGNOSIS — Z515 Encounter for palliative care: Secondary | ICD-10-CM | POA: Diagnosis not present

## 2019-01-30 DIAGNOSIS — R410 Disorientation, unspecified: Secondary | ICD-10-CM | POA: Diagnosis not present

## 2019-01-30 DIAGNOSIS — F0280 Dementia in other diseases classified elsewhere without behavioral disturbance: Secondary | ICD-10-CM | POA: Diagnosis not present

## 2019-01-30 DIAGNOSIS — I779 Disorder of arteries and arterioles, unspecified: Secondary | ICD-10-CM | POA: Diagnosis not present

## 2019-01-31 DIAGNOSIS — I779 Disorder of arteries and arterioles, unspecified: Secondary | ICD-10-CM | POA: Diagnosis not present

## 2019-01-31 DIAGNOSIS — R634 Abnormal weight loss: Secondary | ICD-10-CM | POA: Diagnosis not present

## 2019-01-31 DIAGNOSIS — R451 Restlessness and agitation: Secondary | ICD-10-CM | POA: Diagnosis not present

## 2019-01-31 DIAGNOSIS — I10 Essential (primary) hypertension: Secondary | ICD-10-CM | POA: Diagnosis not present

## 2019-01-31 DIAGNOSIS — Z515 Encounter for palliative care: Secondary | ICD-10-CM | POA: Diagnosis not present

## 2019-01-31 DIAGNOSIS — G300 Alzheimer's disease with early onset: Secondary | ICD-10-CM | POA: Diagnosis not present

## 2019-01-31 DIAGNOSIS — F0280 Dementia in other diseases classified elsewhere without behavioral disturbance: Secondary | ICD-10-CM | POA: Diagnosis not present

## 2019-01-31 DIAGNOSIS — I6523 Occlusion and stenosis of bilateral carotid arteries: Secondary | ICD-10-CM | POA: Diagnosis not present

## 2019-01-31 DIAGNOSIS — Z66 Do not resuscitate: Secondary | ICD-10-CM | POA: Diagnosis not present

## 2019-01-31 DIAGNOSIS — R410 Disorientation, unspecified: Secondary | ICD-10-CM | POA: Diagnosis not present

## 2019-01-31 DIAGNOSIS — R296 Repeated falls: Secondary | ICD-10-CM | POA: Diagnosis not present

## 2019-02-01 DIAGNOSIS — Z66 Do not resuscitate: Secondary | ICD-10-CM | POA: Diagnosis not present

## 2019-02-01 DIAGNOSIS — Z515 Encounter for palliative care: Secondary | ICD-10-CM | POA: Diagnosis not present

## 2019-02-01 DIAGNOSIS — I6523 Occlusion and stenosis of bilateral carotid arteries: Secondary | ICD-10-CM | POA: Diagnosis not present

## 2019-02-01 DIAGNOSIS — I10 Essential (primary) hypertension: Secondary | ICD-10-CM | POA: Diagnosis not present

## 2019-02-01 DIAGNOSIS — R296 Repeated falls: Secondary | ICD-10-CM | POA: Diagnosis not present

## 2019-02-01 DIAGNOSIS — R410 Disorientation, unspecified: Secondary | ICD-10-CM | POA: Diagnosis not present

## 2019-02-01 DIAGNOSIS — R451 Restlessness and agitation: Secondary | ICD-10-CM | POA: Diagnosis not present

## 2019-02-01 DIAGNOSIS — I779 Disorder of arteries and arterioles, unspecified: Secondary | ICD-10-CM | POA: Diagnosis not present

## 2019-02-01 DIAGNOSIS — F0280 Dementia in other diseases classified elsewhere without behavioral disturbance: Secondary | ICD-10-CM | POA: Diagnosis not present

## 2019-02-01 DIAGNOSIS — G300 Alzheimer's disease with early onset: Secondary | ICD-10-CM | POA: Diagnosis not present

## 2019-02-01 DIAGNOSIS — R634 Abnormal weight loss: Secondary | ICD-10-CM | POA: Diagnosis not present

## 2019-02-02 DIAGNOSIS — I779 Disorder of arteries and arterioles, unspecified: Secondary | ICD-10-CM | POA: Diagnosis not present

## 2019-02-02 DIAGNOSIS — Z66 Do not resuscitate: Secondary | ICD-10-CM | POA: Diagnosis not present

## 2019-02-02 DIAGNOSIS — R451 Restlessness and agitation: Secondary | ICD-10-CM | POA: Diagnosis not present

## 2019-02-02 DIAGNOSIS — R410 Disorientation, unspecified: Secondary | ICD-10-CM | POA: Diagnosis not present

## 2019-02-02 DIAGNOSIS — F0280 Dementia in other diseases classified elsewhere without behavioral disturbance: Secondary | ICD-10-CM | POA: Diagnosis not present

## 2019-02-02 DIAGNOSIS — R634 Abnormal weight loss: Secondary | ICD-10-CM | POA: Diagnosis not present

## 2019-02-02 DIAGNOSIS — I10 Essential (primary) hypertension: Secondary | ICD-10-CM | POA: Diagnosis not present

## 2019-02-02 DIAGNOSIS — G300 Alzheimer's disease with early onset: Secondary | ICD-10-CM | POA: Diagnosis not present

## 2019-02-02 DIAGNOSIS — I6523 Occlusion and stenosis of bilateral carotid arteries: Secondary | ICD-10-CM | POA: Diagnosis not present

## 2019-02-02 DIAGNOSIS — Z515 Encounter for palliative care: Secondary | ICD-10-CM | POA: Diagnosis not present

## 2019-02-02 DIAGNOSIS — R296 Repeated falls: Secondary | ICD-10-CM | POA: Diagnosis not present

## 2019-02-03 DIAGNOSIS — Z515 Encounter for palliative care: Secondary | ICD-10-CM | POA: Diagnosis not present

## 2019-02-03 DIAGNOSIS — Z66 Do not resuscitate: Secondary | ICD-10-CM | POA: Diagnosis not present

## 2019-02-03 DIAGNOSIS — I10 Essential (primary) hypertension: Secondary | ICD-10-CM | POA: Diagnosis not present

## 2019-02-03 DIAGNOSIS — G300 Alzheimer's disease with early onset: Secondary | ICD-10-CM | POA: Diagnosis not present

## 2019-02-03 DIAGNOSIS — R410 Disorientation, unspecified: Secondary | ICD-10-CM | POA: Diagnosis not present

## 2019-02-03 DIAGNOSIS — R634 Abnormal weight loss: Secondary | ICD-10-CM | POA: Diagnosis not present

## 2019-02-03 DIAGNOSIS — R451 Restlessness and agitation: Secondary | ICD-10-CM | POA: Diagnosis not present

## 2019-02-03 DIAGNOSIS — R296 Repeated falls: Secondary | ICD-10-CM | POA: Diagnosis not present

## 2019-02-03 DIAGNOSIS — F0280 Dementia in other diseases classified elsewhere without behavioral disturbance: Secondary | ICD-10-CM | POA: Diagnosis not present

## 2019-02-03 DIAGNOSIS — I779 Disorder of arteries and arterioles, unspecified: Secondary | ICD-10-CM | POA: Diagnosis not present

## 2019-02-03 DIAGNOSIS — I6523 Occlusion and stenosis of bilateral carotid arteries: Secondary | ICD-10-CM | POA: Diagnosis not present

## 2019-02-04 DIAGNOSIS — Z66 Do not resuscitate: Secondary | ICD-10-CM | POA: Diagnosis not present

## 2019-02-04 DIAGNOSIS — R451 Restlessness and agitation: Secondary | ICD-10-CM | POA: Diagnosis not present

## 2019-02-04 DIAGNOSIS — I6523 Occlusion and stenosis of bilateral carotid arteries: Secondary | ICD-10-CM | POA: Diagnosis not present

## 2019-02-04 DIAGNOSIS — R634 Abnormal weight loss: Secondary | ICD-10-CM | POA: Diagnosis not present

## 2019-02-04 DIAGNOSIS — I779 Disorder of arteries and arterioles, unspecified: Secondary | ICD-10-CM | POA: Diagnosis not present

## 2019-02-04 DIAGNOSIS — G300 Alzheimer's disease with early onset: Secondary | ICD-10-CM | POA: Diagnosis not present

## 2019-02-04 DIAGNOSIS — F0280 Dementia in other diseases classified elsewhere without behavioral disturbance: Secondary | ICD-10-CM | POA: Diagnosis not present

## 2019-02-04 DIAGNOSIS — R410 Disorientation, unspecified: Secondary | ICD-10-CM | POA: Diagnosis not present

## 2019-02-04 DIAGNOSIS — Z515 Encounter for palliative care: Secondary | ICD-10-CM | POA: Diagnosis not present

## 2019-02-04 DIAGNOSIS — I10 Essential (primary) hypertension: Secondary | ICD-10-CM | POA: Diagnosis not present

## 2019-02-04 DIAGNOSIS — R296 Repeated falls: Secondary | ICD-10-CM | POA: Diagnosis not present

## 2019-02-05 DIAGNOSIS — Z66 Do not resuscitate: Secondary | ICD-10-CM | POA: Diagnosis not present

## 2019-02-05 DIAGNOSIS — I779 Disorder of arteries and arterioles, unspecified: Secondary | ICD-10-CM | POA: Diagnosis not present

## 2019-02-05 DIAGNOSIS — I10 Essential (primary) hypertension: Secondary | ICD-10-CM | POA: Diagnosis not present

## 2019-02-05 DIAGNOSIS — I6523 Occlusion and stenosis of bilateral carotid arteries: Secondary | ICD-10-CM | POA: Diagnosis not present

## 2019-02-05 DIAGNOSIS — F0280 Dementia in other diseases classified elsewhere without behavioral disturbance: Secondary | ICD-10-CM | POA: Diagnosis not present

## 2019-02-05 DIAGNOSIS — R634 Abnormal weight loss: Secondary | ICD-10-CM | POA: Diagnosis not present

## 2019-02-05 DIAGNOSIS — G300 Alzheimer's disease with early onset: Secondary | ICD-10-CM | POA: Diagnosis not present

## 2019-02-05 DIAGNOSIS — R451 Restlessness and agitation: Secondary | ICD-10-CM | POA: Diagnosis not present

## 2019-02-05 DIAGNOSIS — Z515 Encounter for palliative care: Secondary | ICD-10-CM | POA: Diagnosis not present

## 2019-02-05 DIAGNOSIS — R410 Disorientation, unspecified: Secondary | ICD-10-CM | POA: Diagnosis not present

## 2019-02-05 DIAGNOSIS — R296 Repeated falls: Secondary | ICD-10-CM | POA: Diagnosis not present

## 2019-02-06 DIAGNOSIS — I10 Essential (primary) hypertension: Secondary | ICD-10-CM | POA: Diagnosis not present

## 2019-02-06 DIAGNOSIS — I6523 Occlusion and stenosis of bilateral carotid arteries: Secondary | ICD-10-CM | POA: Diagnosis not present

## 2019-02-06 DIAGNOSIS — R296 Repeated falls: Secondary | ICD-10-CM | POA: Diagnosis not present

## 2019-02-06 DIAGNOSIS — I779 Disorder of arteries and arterioles, unspecified: Secondary | ICD-10-CM | POA: Diagnosis not present

## 2019-02-06 DIAGNOSIS — R451 Restlessness and agitation: Secondary | ICD-10-CM | POA: Diagnosis not present

## 2019-02-06 DIAGNOSIS — R634 Abnormal weight loss: Secondary | ICD-10-CM | POA: Diagnosis not present

## 2019-02-06 DIAGNOSIS — G300 Alzheimer's disease with early onset: Secondary | ICD-10-CM | POA: Diagnosis not present

## 2019-02-06 DIAGNOSIS — Z515 Encounter for palliative care: Secondary | ICD-10-CM | POA: Diagnosis not present

## 2019-02-06 DIAGNOSIS — F0280 Dementia in other diseases classified elsewhere without behavioral disturbance: Secondary | ICD-10-CM | POA: Diagnosis not present

## 2019-02-06 DIAGNOSIS — Z66 Do not resuscitate: Secondary | ICD-10-CM | POA: Diagnosis not present

## 2019-02-06 DIAGNOSIS — R410 Disorientation, unspecified: Secondary | ICD-10-CM | POA: Diagnosis not present

## 2019-02-07 DIAGNOSIS — R634 Abnormal weight loss: Secondary | ICD-10-CM | POA: Diagnosis not present

## 2019-02-07 DIAGNOSIS — R410 Disorientation, unspecified: Secondary | ICD-10-CM | POA: Diagnosis not present

## 2019-02-07 DIAGNOSIS — Z515 Encounter for palliative care: Secondary | ICD-10-CM | POA: Diagnosis not present

## 2019-02-07 DIAGNOSIS — R451 Restlessness and agitation: Secondary | ICD-10-CM | POA: Diagnosis not present

## 2019-02-07 DIAGNOSIS — I6523 Occlusion and stenosis of bilateral carotid arteries: Secondary | ICD-10-CM | POA: Diagnosis not present

## 2019-02-07 DIAGNOSIS — Z66 Do not resuscitate: Secondary | ICD-10-CM | POA: Diagnosis not present

## 2019-02-07 DIAGNOSIS — F0280 Dementia in other diseases classified elsewhere without behavioral disturbance: Secondary | ICD-10-CM | POA: Diagnosis not present

## 2019-02-07 DIAGNOSIS — I10 Essential (primary) hypertension: Secondary | ICD-10-CM | POA: Diagnosis not present

## 2019-02-07 DIAGNOSIS — G300 Alzheimer's disease with early onset: Secondary | ICD-10-CM | POA: Diagnosis not present

## 2019-02-07 DIAGNOSIS — R296 Repeated falls: Secondary | ICD-10-CM | POA: Diagnosis not present

## 2019-02-07 DIAGNOSIS — I779 Disorder of arteries and arterioles, unspecified: Secondary | ICD-10-CM | POA: Diagnosis not present

## 2019-02-08 DIAGNOSIS — G300 Alzheimer's disease with early onset: Secondary | ICD-10-CM | POA: Diagnosis not present

## 2019-02-08 DIAGNOSIS — Z66 Do not resuscitate: Secondary | ICD-10-CM | POA: Diagnosis not present

## 2019-02-08 DIAGNOSIS — Z515 Encounter for palliative care: Secondary | ICD-10-CM | POA: Diagnosis not present

## 2019-02-08 DIAGNOSIS — I779 Disorder of arteries and arterioles, unspecified: Secondary | ICD-10-CM | POA: Diagnosis not present

## 2019-02-08 DIAGNOSIS — I10 Essential (primary) hypertension: Secondary | ICD-10-CM | POA: Diagnosis not present

## 2019-02-08 DIAGNOSIS — R410 Disorientation, unspecified: Secondary | ICD-10-CM | POA: Diagnosis not present

## 2019-02-08 DIAGNOSIS — I6523 Occlusion and stenosis of bilateral carotid arteries: Secondary | ICD-10-CM | POA: Diagnosis not present

## 2019-02-08 DIAGNOSIS — F0280 Dementia in other diseases classified elsewhere without behavioral disturbance: Secondary | ICD-10-CM | POA: Diagnosis not present

## 2019-02-08 DIAGNOSIS — R296 Repeated falls: Secondary | ICD-10-CM | POA: Diagnosis not present

## 2019-02-08 DIAGNOSIS — R451 Restlessness and agitation: Secondary | ICD-10-CM | POA: Diagnosis not present

## 2019-02-08 DIAGNOSIS — R634 Abnormal weight loss: Secondary | ICD-10-CM | POA: Diagnosis not present

## 2019-02-09 DIAGNOSIS — R296 Repeated falls: Secondary | ICD-10-CM | POA: Diagnosis not present

## 2019-02-09 DIAGNOSIS — R451 Restlessness and agitation: Secondary | ICD-10-CM | POA: Diagnosis not present

## 2019-02-09 DIAGNOSIS — Z515 Encounter for palliative care: Secondary | ICD-10-CM | POA: Diagnosis not present

## 2019-02-09 DIAGNOSIS — F0280 Dementia in other diseases classified elsewhere without behavioral disturbance: Secondary | ICD-10-CM | POA: Diagnosis not present

## 2019-02-09 DIAGNOSIS — I10 Essential (primary) hypertension: Secondary | ICD-10-CM | POA: Diagnosis not present

## 2019-02-09 DIAGNOSIS — I6523 Occlusion and stenosis of bilateral carotid arteries: Secondary | ICD-10-CM | POA: Diagnosis not present

## 2019-02-09 DIAGNOSIS — G300 Alzheimer's disease with early onset: Secondary | ICD-10-CM | POA: Diagnosis not present

## 2019-02-09 DIAGNOSIS — R410 Disorientation, unspecified: Secondary | ICD-10-CM | POA: Diagnosis not present

## 2019-02-09 DIAGNOSIS — Z66 Do not resuscitate: Secondary | ICD-10-CM | POA: Diagnosis not present

## 2019-02-09 DIAGNOSIS — I779 Disorder of arteries and arterioles, unspecified: Secondary | ICD-10-CM | POA: Diagnosis not present

## 2019-02-09 DIAGNOSIS — R634 Abnormal weight loss: Secondary | ICD-10-CM | POA: Diagnosis not present

## 2019-02-10 DIAGNOSIS — I6523 Occlusion and stenosis of bilateral carotid arteries: Secondary | ICD-10-CM | POA: Diagnosis not present

## 2019-02-10 DIAGNOSIS — R451 Restlessness and agitation: Secondary | ICD-10-CM | POA: Diagnosis not present

## 2019-02-10 DIAGNOSIS — R296 Repeated falls: Secondary | ICD-10-CM | POA: Diagnosis not present

## 2019-02-10 DIAGNOSIS — I779 Disorder of arteries and arterioles, unspecified: Secondary | ICD-10-CM | POA: Diagnosis not present

## 2019-02-10 DIAGNOSIS — Z515 Encounter for palliative care: Secondary | ICD-10-CM | POA: Diagnosis not present

## 2019-02-10 DIAGNOSIS — F0280 Dementia in other diseases classified elsewhere without behavioral disturbance: Secondary | ICD-10-CM | POA: Diagnosis not present

## 2019-02-10 DIAGNOSIS — R410 Disorientation, unspecified: Secondary | ICD-10-CM | POA: Diagnosis not present

## 2019-02-10 DIAGNOSIS — G300 Alzheimer's disease with early onset: Secondary | ICD-10-CM | POA: Diagnosis not present

## 2019-02-10 DIAGNOSIS — R634 Abnormal weight loss: Secondary | ICD-10-CM | POA: Diagnosis not present

## 2019-02-10 DIAGNOSIS — Z66 Do not resuscitate: Secondary | ICD-10-CM | POA: Diagnosis not present

## 2019-02-10 DIAGNOSIS — I10 Essential (primary) hypertension: Secondary | ICD-10-CM | POA: Diagnosis not present

## 2019-02-11 DIAGNOSIS — I779 Disorder of arteries and arterioles, unspecified: Secondary | ICD-10-CM | POA: Diagnosis not present

## 2019-02-11 DIAGNOSIS — R410 Disorientation, unspecified: Secondary | ICD-10-CM | POA: Diagnosis not present

## 2019-02-11 DIAGNOSIS — R634 Abnormal weight loss: Secondary | ICD-10-CM | POA: Diagnosis not present

## 2019-02-11 DIAGNOSIS — Z515 Encounter for palliative care: Secondary | ICD-10-CM | POA: Diagnosis not present

## 2019-02-11 DIAGNOSIS — G300 Alzheimer's disease with early onset: Secondary | ICD-10-CM | POA: Diagnosis not present

## 2019-02-11 DIAGNOSIS — Z66 Do not resuscitate: Secondary | ICD-10-CM | POA: Diagnosis not present

## 2019-02-11 DIAGNOSIS — R451 Restlessness and agitation: Secondary | ICD-10-CM | POA: Diagnosis not present

## 2019-02-11 DIAGNOSIS — I10 Essential (primary) hypertension: Secondary | ICD-10-CM | POA: Diagnosis not present

## 2019-02-11 DIAGNOSIS — R296 Repeated falls: Secondary | ICD-10-CM | POA: Diagnosis not present

## 2019-02-11 DIAGNOSIS — I6523 Occlusion and stenosis of bilateral carotid arteries: Secondary | ICD-10-CM | POA: Diagnosis not present

## 2019-02-11 DIAGNOSIS — F0280 Dementia in other diseases classified elsewhere without behavioral disturbance: Secondary | ICD-10-CM | POA: Diagnosis not present

## 2019-02-12 DIAGNOSIS — Z66 Do not resuscitate: Secondary | ICD-10-CM | POA: Diagnosis not present

## 2019-02-12 DIAGNOSIS — I779 Disorder of arteries and arterioles, unspecified: Secondary | ICD-10-CM | POA: Diagnosis not present

## 2019-02-12 DIAGNOSIS — Z515 Encounter for palliative care: Secondary | ICD-10-CM | POA: Diagnosis not present

## 2019-02-12 DIAGNOSIS — F0280 Dementia in other diseases classified elsewhere without behavioral disturbance: Secondary | ICD-10-CM | POA: Diagnosis not present

## 2019-02-12 DIAGNOSIS — I6523 Occlusion and stenosis of bilateral carotid arteries: Secondary | ICD-10-CM | POA: Diagnosis not present

## 2019-02-12 DIAGNOSIS — G300 Alzheimer's disease with early onset: Secondary | ICD-10-CM | POA: Diagnosis not present

## 2019-02-12 DIAGNOSIS — R451 Restlessness and agitation: Secondary | ICD-10-CM | POA: Diagnosis not present

## 2019-02-12 DIAGNOSIS — R296 Repeated falls: Secondary | ICD-10-CM | POA: Diagnosis not present

## 2019-02-12 DIAGNOSIS — I10 Essential (primary) hypertension: Secondary | ICD-10-CM | POA: Diagnosis not present

## 2019-02-12 DIAGNOSIS — R634 Abnormal weight loss: Secondary | ICD-10-CM | POA: Diagnosis not present

## 2019-02-12 DIAGNOSIS — R410 Disorientation, unspecified: Secondary | ICD-10-CM | POA: Diagnosis not present

## 2019-02-13 DIAGNOSIS — R634 Abnormal weight loss: Secondary | ICD-10-CM | POA: Diagnosis not present

## 2019-02-13 DIAGNOSIS — Z515 Encounter for palliative care: Secondary | ICD-10-CM | POA: Diagnosis not present

## 2019-02-13 DIAGNOSIS — Z66 Do not resuscitate: Secondary | ICD-10-CM | POA: Diagnosis not present

## 2019-02-13 DIAGNOSIS — I10 Essential (primary) hypertension: Secondary | ICD-10-CM | POA: Diagnosis not present

## 2019-02-13 DIAGNOSIS — R451 Restlessness and agitation: Secondary | ICD-10-CM | POA: Diagnosis not present

## 2019-02-13 DIAGNOSIS — I6523 Occlusion and stenosis of bilateral carotid arteries: Secondary | ICD-10-CM | POA: Diagnosis not present

## 2019-02-13 DIAGNOSIS — F0280 Dementia in other diseases classified elsewhere without behavioral disturbance: Secondary | ICD-10-CM | POA: Diagnosis not present

## 2019-02-13 DIAGNOSIS — G300 Alzheimer's disease with early onset: Secondary | ICD-10-CM | POA: Diagnosis not present

## 2019-02-13 DIAGNOSIS — R410 Disorientation, unspecified: Secondary | ICD-10-CM | POA: Diagnosis not present

## 2019-02-13 DIAGNOSIS — I779 Disorder of arteries and arterioles, unspecified: Secondary | ICD-10-CM | POA: Diagnosis not present

## 2019-02-13 DIAGNOSIS — R296 Repeated falls: Secondary | ICD-10-CM | POA: Diagnosis not present

## 2019-02-14 DIAGNOSIS — R296 Repeated falls: Secondary | ICD-10-CM | POA: Diagnosis not present

## 2019-02-14 DIAGNOSIS — G300 Alzheimer's disease with early onset: Secondary | ICD-10-CM | POA: Diagnosis not present

## 2019-02-14 DIAGNOSIS — F0280 Dementia in other diseases classified elsewhere without behavioral disturbance: Secondary | ICD-10-CM | POA: Diagnosis not present

## 2019-02-14 DIAGNOSIS — Z66 Do not resuscitate: Secondary | ICD-10-CM | POA: Diagnosis not present

## 2019-02-14 DIAGNOSIS — R451 Restlessness and agitation: Secondary | ICD-10-CM | POA: Diagnosis not present

## 2019-02-14 DIAGNOSIS — I779 Disorder of arteries and arterioles, unspecified: Secondary | ICD-10-CM | POA: Diagnosis not present

## 2019-02-14 DIAGNOSIS — I6523 Occlusion and stenosis of bilateral carotid arteries: Secondary | ICD-10-CM | POA: Diagnosis not present

## 2019-02-14 DIAGNOSIS — R410 Disorientation, unspecified: Secondary | ICD-10-CM | POA: Diagnosis not present

## 2019-02-14 DIAGNOSIS — I10 Essential (primary) hypertension: Secondary | ICD-10-CM | POA: Diagnosis not present

## 2019-02-14 DIAGNOSIS — Z515 Encounter for palliative care: Secondary | ICD-10-CM | POA: Diagnosis not present

## 2019-02-14 DIAGNOSIS — R634 Abnormal weight loss: Secondary | ICD-10-CM | POA: Diagnosis not present

## 2019-02-15 DIAGNOSIS — R451 Restlessness and agitation: Secondary | ICD-10-CM | POA: Diagnosis not present

## 2019-02-15 DIAGNOSIS — I779 Disorder of arteries and arterioles, unspecified: Secondary | ICD-10-CM | POA: Diagnosis not present

## 2019-02-15 DIAGNOSIS — I10 Essential (primary) hypertension: Secondary | ICD-10-CM | POA: Diagnosis not present

## 2019-02-15 DIAGNOSIS — R634 Abnormal weight loss: Secondary | ICD-10-CM | POA: Diagnosis not present

## 2019-02-15 DIAGNOSIS — I6523 Occlusion and stenosis of bilateral carotid arteries: Secondary | ICD-10-CM | POA: Diagnosis not present

## 2019-02-15 DIAGNOSIS — R296 Repeated falls: Secondary | ICD-10-CM | POA: Diagnosis not present

## 2019-02-15 DIAGNOSIS — F0280 Dementia in other diseases classified elsewhere without behavioral disturbance: Secondary | ICD-10-CM | POA: Diagnosis not present

## 2019-02-15 DIAGNOSIS — G300 Alzheimer's disease with early onset: Secondary | ICD-10-CM | POA: Diagnosis not present

## 2019-02-15 DIAGNOSIS — Z515 Encounter for palliative care: Secondary | ICD-10-CM | POA: Diagnosis not present

## 2019-02-15 DIAGNOSIS — Z66 Do not resuscitate: Secondary | ICD-10-CM | POA: Diagnosis not present

## 2019-02-15 DIAGNOSIS — R410 Disorientation, unspecified: Secondary | ICD-10-CM | POA: Diagnosis not present

## 2019-02-16 DIAGNOSIS — R410 Disorientation, unspecified: Secondary | ICD-10-CM | POA: Diagnosis not present

## 2019-02-16 DIAGNOSIS — R634 Abnormal weight loss: Secondary | ICD-10-CM | POA: Diagnosis not present

## 2019-02-16 DIAGNOSIS — R451 Restlessness and agitation: Secondary | ICD-10-CM | POA: Diagnosis not present

## 2019-02-16 DIAGNOSIS — F0280 Dementia in other diseases classified elsewhere without behavioral disturbance: Secondary | ICD-10-CM | POA: Diagnosis not present

## 2019-02-16 DIAGNOSIS — G300 Alzheimer's disease with early onset: Secondary | ICD-10-CM | POA: Diagnosis not present

## 2019-02-16 DIAGNOSIS — I779 Disorder of arteries and arterioles, unspecified: Secondary | ICD-10-CM | POA: Diagnosis not present

## 2019-02-16 DIAGNOSIS — I6523 Occlusion and stenosis of bilateral carotid arteries: Secondary | ICD-10-CM | POA: Diagnosis not present

## 2019-02-16 DIAGNOSIS — R296 Repeated falls: Secondary | ICD-10-CM | POA: Diagnosis not present

## 2019-02-16 DIAGNOSIS — Z66 Do not resuscitate: Secondary | ICD-10-CM | POA: Diagnosis not present

## 2019-02-16 DIAGNOSIS — Z515 Encounter for palliative care: Secondary | ICD-10-CM | POA: Diagnosis not present

## 2019-02-16 DIAGNOSIS — I10 Essential (primary) hypertension: Secondary | ICD-10-CM | POA: Diagnosis not present

## 2019-02-17 DIAGNOSIS — Z66 Do not resuscitate: Secondary | ICD-10-CM | POA: Diagnosis not present

## 2019-02-17 DIAGNOSIS — Z515 Encounter for palliative care: Secondary | ICD-10-CM | POA: Diagnosis not present

## 2019-02-17 DIAGNOSIS — R296 Repeated falls: Secondary | ICD-10-CM | POA: Diagnosis not present

## 2019-02-17 DIAGNOSIS — G300 Alzheimer's disease with early onset: Secondary | ICD-10-CM | POA: Diagnosis not present

## 2019-02-17 DIAGNOSIS — I6523 Occlusion and stenosis of bilateral carotid arteries: Secondary | ICD-10-CM | POA: Diagnosis not present

## 2019-02-17 DIAGNOSIS — I779 Disorder of arteries and arterioles, unspecified: Secondary | ICD-10-CM | POA: Diagnosis not present

## 2019-02-17 DIAGNOSIS — R410 Disorientation, unspecified: Secondary | ICD-10-CM | POA: Diagnosis not present

## 2019-02-17 DIAGNOSIS — R634 Abnormal weight loss: Secondary | ICD-10-CM | POA: Diagnosis not present

## 2019-02-17 DIAGNOSIS — R451 Restlessness and agitation: Secondary | ICD-10-CM | POA: Diagnosis not present

## 2019-02-17 DIAGNOSIS — F0280 Dementia in other diseases classified elsewhere without behavioral disturbance: Secondary | ICD-10-CM | POA: Diagnosis not present

## 2019-02-17 DIAGNOSIS — I10 Essential (primary) hypertension: Secondary | ICD-10-CM | POA: Diagnosis not present

## 2019-02-18 DIAGNOSIS — Z515 Encounter for palliative care: Secondary | ICD-10-CM | POA: Diagnosis not present

## 2019-02-18 DIAGNOSIS — Z66 Do not resuscitate: Secondary | ICD-10-CM | POA: Diagnosis not present

## 2019-02-18 DIAGNOSIS — I6523 Occlusion and stenosis of bilateral carotid arteries: Secondary | ICD-10-CM | POA: Diagnosis not present

## 2019-02-18 DIAGNOSIS — I779 Disorder of arteries and arterioles, unspecified: Secondary | ICD-10-CM | POA: Diagnosis not present

## 2019-02-18 DIAGNOSIS — R451 Restlessness and agitation: Secondary | ICD-10-CM | POA: Diagnosis not present

## 2019-02-18 DIAGNOSIS — I10 Essential (primary) hypertension: Secondary | ICD-10-CM | POA: Diagnosis not present

## 2019-02-18 DIAGNOSIS — R296 Repeated falls: Secondary | ICD-10-CM | POA: Diagnosis not present

## 2019-02-18 DIAGNOSIS — R410 Disorientation, unspecified: Secondary | ICD-10-CM | POA: Diagnosis not present

## 2019-02-18 DIAGNOSIS — R634 Abnormal weight loss: Secondary | ICD-10-CM | POA: Diagnosis not present

## 2019-02-18 DIAGNOSIS — F0280 Dementia in other diseases classified elsewhere without behavioral disturbance: Secondary | ICD-10-CM | POA: Diagnosis not present

## 2019-02-18 DIAGNOSIS — G300 Alzheimer's disease with early onset: Secondary | ICD-10-CM | POA: Diagnosis not present

## 2019-02-19 DIAGNOSIS — R451 Restlessness and agitation: Secondary | ICD-10-CM | POA: Diagnosis not present

## 2019-02-19 DIAGNOSIS — Z515 Encounter for palliative care: Secondary | ICD-10-CM | POA: Diagnosis not present

## 2019-02-19 DIAGNOSIS — G300 Alzheimer's disease with early onset: Secondary | ICD-10-CM | POA: Diagnosis not present

## 2019-02-19 DIAGNOSIS — F0280 Dementia in other diseases classified elsewhere without behavioral disturbance: Secondary | ICD-10-CM | POA: Diagnosis not present

## 2019-02-19 DIAGNOSIS — R410 Disorientation, unspecified: Secondary | ICD-10-CM | POA: Diagnosis not present

## 2019-02-19 DIAGNOSIS — I6523 Occlusion and stenosis of bilateral carotid arteries: Secondary | ICD-10-CM | POA: Diagnosis not present

## 2019-02-19 DIAGNOSIS — I779 Disorder of arteries and arterioles, unspecified: Secondary | ICD-10-CM | POA: Diagnosis not present

## 2019-02-19 DIAGNOSIS — I10 Essential (primary) hypertension: Secondary | ICD-10-CM | POA: Diagnosis not present

## 2019-02-19 DIAGNOSIS — R296 Repeated falls: Secondary | ICD-10-CM | POA: Diagnosis not present

## 2019-02-19 DIAGNOSIS — R634 Abnormal weight loss: Secondary | ICD-10-CM | POA: Diagnosis not present

## 2019-02-19 DIAGNOSIS — Z66 Do not resuscitate: Secondary | ICD-10-CM | POA: Diagnosis not present

## 2019-02-20 DIAGNOSIS — G300 Alzheimer's disease with early onset: Secondary | ICD-10-CM | POA: Diagnosis not present

## 2019-02-20 DIAGNOSIS — F0280 Dementia in other diseases classified elsewhere without behavioral disturbance: Secondary | ICD-10-CM | POA: Diagnosis not present

## 2019-02-20 DIAGNOSIS — R451 Restlessness and agitation: Secondary | ICD-10-CM | POA: Diagnosis not present

## 2019-02-20 DIAGNOSIS — R296 Repeated falls: Secondary | ICD-10-CM | POA: Diagnosis not present

## 2019-02-20 DIAGNOSIS — Z66 Do not resuscitate: Secondary | ICD-10-CM | POA: Diagnosis not present

## 2019-02-20 DIAGNOSIS — Z515 Encounter for palliative care: Secondary | ICD-10-CM | POA: Diagnosis not present

## 2019-02-20 DIAGNOSIS — I6523 Occlusion and stenosis of bilateral carotid arteries: Secondary | ICD-10-CM | POA: Diagnosis not present

## 2019-02-20 DIAGNOSIS — R634 Abnormal weight loss: Secondary | ICD-10-CM | POA: Diagnosis not present

## 2019-02-20 DIAGNOSIS — R410 Disorientation, unspecified: Secondary | ICD-10-CM | POA: Diagnosis not present

## 2019-02-20 DIAGNOSIS — I10 Essential (primary) hypertension: Secondary | ICD-10-CM | POA: Diagnosis not present

## 2019-02-20 DIAGNOSIS — I779 Disorder of arteries and arterioles, unspecified: Secondary | ICD-10-CM | POA: Diagnosis not present

## 2019-02-21 DIAGNOSIS — Z515 Encounter for palliative care: Secondary | ICD-10-CM | POA: Diagnosis not present

## 2019-02-21 DIAGNOSIS — R634 Abnormal weight loss: Secondary | ICD-10-CM | POA: Diagnosis not present

## 2019-02-21 DIAGNOSIS — I779 Disorder of arteries and arterioles, unspecified: Secondary | ICD-10-CM | POA: Diagnosis not present

## 2019-02-21 DIAGNOSIS — F0280 Dementia in other diseases classified elsewhere without behavioral disturbance: Secondary | ICD-10-CM | POA: Diagnosis not present

## 2019-02-21 DIAGNOSIS — I10 Essential (primary) hypertension: Secondary | ICD-10-CM | POA: Diagnosis not present

## 2019-02-21 DIAGNOSIS — Z66 Do not resuscitate: Secondary | ICD-10-CM | POA: Diagnosis not present

## 2019-02-21 DIAGNOSIS — R410 Disorientation, unspecified: Secondary | ICD-10-CM | POA: Diagnosis not present

## 2019-02-21 DIAGNOSIS — G300 Alzheimer's disease with early onset: Secondary | ICD-10-CM | POA: Diagnosis not present

## 2019-02-21 DIAGNOSIS — R296 Repeated falls: Secondary | ICD-10-CM | POA: Diagnosis not present

## 2019-02-21 DIAGNOSIS — R451 Restlessness and agitation: Secondary | ICD-10-CM | POA: Diagnosis not present

## 2019-02-21 DIAGNOSIS — I6523 Occlusion and stenosis of bilateral carotid arteries: Secondary | ICD-10-CM | POA: Diagnosis not present

## 2019-02-22 DIAGNOSIS — G300 Alzheimer's disease with early onset: Secondary | ICD-10-CM | POA: Diagnosis not present

## 2019-02-22 DIAGNOSIS — R451 Restlessness and agitation: Secondary | ICD-10-CM | POA: Diagnosis not present

## 2019-02-22 DIAGNOSIS — R410 Disorientation, unspecified: Secondary | ICD-10-CM | POA: Diagnosis not present

## 2019-02-22 DIAGNOSIS — I6523 Occlusion and stenosis of bilateral carotid arteries: Secondary | ICD-10-CM | POA: Diagnosis not present

## 2019-02-22 DIAGNOSIS — R296 Repeated falls: Secondary | ICD-10-CM | POA: Diagnosis not present

## 2019-02-22 DIAGNOSIS — Z66 Do not resuscitate: Secondary | ICD-10-CM | POA: Diagnosis not present

## 2019-02-22 DIAGNOSIS — R634 Abnormal weight loss: Secondary | ICD-10-CM | POA: Diagnosis not present

## 2019-02-22 DIAGNOSIS — I10 Essential (primary) hypertension: Secondary | ICD-10-CM | POA: Diagnosis not present

## 2019-02-22 DIAGNOSIS — F0280 Dementia in other diseases classified elsewhere without behavioral disturbance: Secondary | ICD-10-CM | POA: Diagnosis not present

## 2019-02-22 DIAGNOSIS — Z515 Encounter for palliative care: Secondary | ICD-10-CM | POA: Diagnosis not present

## 2019-02-22 DIAGNOSIS — I779 Disorder of arteries and arterioles, unspecified: Secondary | ICD-10-CM | POA: Diagnosis not present

## 2019-02-23 DIAGNOSIS — I6523 Occlusion and stenosis of bilateral carotid arteries: Secondary | ICD-10-CM | POA: Diagnosis not present

## 2019-02-23 DIAGNOSIS — I779 Disorder of arteries and arterioles, unspecified: Secondary | ICD-10-CM | POA: Diagnosis not present

## 2019-02-23 DIAGNOSIS — I10 Essential (primary) hypertension: Secondary | ICD-10-CM | POA: Diagnosis not present

## 2019-02-23 DIAGNOSIS — R451 Restlessness and agitation: Secondary | ICD-10-CM | POA: Diagnosis not present

## 2019-02-23 DIAGNOSIS — Z515 Encounter for palliative care: Secondary | ICD-10-CM | POA: Diagnosis not present

## 2019-02-23 DIAGNOSIS — R634 Abnormal weight loss: Secondary | ICD-10-CM | POA: Diagnosis not present

## 2019-02-23 DIAGNOSIS — F0280 Dementia in other diseases classified elsewhere without behavioral disturbance: Secondary | ICD-10-CM | POA: Diagnosis not present

## 2019-02-23 DIAGNOSIS — G300 Alzheimer's disease with early onset: Secondary | ICD-10-CM | POA: Diagnosis not present

## 2019-02-23 DIAGNOSIS — Z66 Do not resuscitate: Secondary | ICD-10-CM | POA: Diagnosis not present

## 2019-02-23 DIAGNOSIS — R296 Repeated falls: Secondary | ICD-10-CM | POA: Diagnosis not present

## 2019-02-23 DIAGNOSIS — R410 Disorientation, unspecified: Secondary | ICD-10-CM | POA: Diagnosis not present

## 2019-02-24 DIAGNOSIS — F0280 Dementia in other diseases classified elsewhere without behavioral disturbance: Secondary | ICD-10-CM | POA: Diagnosis not present

## 2019-02-24 DIAGNOSIS — R634 Abnormal weight loss: Secondary | ICD-10-CM | POA: Diagnosis not present

## 2019-02-24 DIAGNOSIS — Z515 Encounter for palliative care: Secondary | ICD-10-CM | POA: Diagnosis not present

## 2019-02-24 DIAGNOSIS — R410 Disorientation, unspecified: Secondary | ICD-10-CM | POA: Diagnosis not present

## 2019-02-24 DIAGNOSIS — G300 Alzheimer's disease with early onset: Secondary | ICD-10-CM | POA: Diagnosis not present

## 2019-02-24 DIAGNOSIS — R296 Repeated falls: Secondary | ICD-10-CM | POA: Diagnosis not present

## 2019-02-24 DIAGNOSIS — I6523 Occlusion and stenosis of bilateral carotid arteries: Secondary | ICD-10-CM | POA: Diagnosis not present

## 2019-02-24 DIAGNOSIS — I779 Disorder of arteries and arterioles, unspecified: Secondary | ICD-10-CM | POA: Diagnosis not present

## 2019-02-24 DIAGNOSIS — Z66 Do not resuscitate: Secondary | ICD-10-CM | POA: Diagnosis not present

## 2019-02-24 DIAGNOSIS — R451 Restlessness and agitation: Secondary | ICD-10-CM | POA: Diagnosis not present

## 2019-02-24 DIAGNOSIS — I10 Essential (primary) hypertension: Secondary | ICD-10-CM | POA: Diagnosis not present

## 2019-02-25 DIAGNOSIS — Z515 Encounter for palliative care: Secondary | ICD-10-CM | POA: Diagnosis not present

## 2019-02-25 DIAGNOSIS — Z66 Do not resuscitate: Secondary | ICD-10-CM | POA: Diagnosis not present

## 2019-02-25 DIAGNOSIS — R451 Restlessness and agitation: Secondary | ICD-10-CM | POA: Diagnosis not present

## 2019-02-25 DIAGNOSIS — R634 Abnormal weight loss: Secondary | ICD-10-CM | POA: Diagnosis not present

## 2019-02-25 DIAGNOSIS — I779 Disorder of arteries and arterioles, unspecified: Secondary | ICD-10-CM | POA: Diagnosis not present

## 2019-02-25 DIAGNOSIS — I10 Essential (primary) hypertension: Secondary | ICD-10-CM | POA: Diagnosis not present

## 2019-02-25 DIAGNOSIS — F0280 Dementia in other diseases classified elsewhere without behavioral disturbance: Secondary | ICD-10-CM | POA: Diagnosis not present

## 2019-02-25 DIAGNOSIS — G300 Alzheimer's disease with early onset: Secondary | ICD-10-CM | POA: Diagnosis not present

## 2019-02-25 DIAGNOSIS — R296 Repeated falls: Secondary | ICD-10-CM | POA: Diagnosis not present

## 2019-02-25 DIAGNOSIS — I6523 Occlusion and stenosis of bilateral carotid arteries: Secondary | ICD-10-CM | POA: Diagnosis not present

## 2019-02-25 DIAGNOSIS — R410 Disorientation, unspecified: Secondary | ICD-10-CM | POA: Diagnosis not present

## 2019-02-26 DIAGNOSIS — F0280 Dementia in other diseases classified elsewhere without behavioral disturbance: Secondary | ICD-10-CM | POA: Diagnosis not present

## 2019-02-26 DIAGNOSIS — I10 Essential (primary) hypertension: Secondary | ICD-10-CM | POA: Diagnosis not present

## 2019-02-26 DIAGNOSIS — R634 Abnormal weight loss: Secondary | ICD-10-CM | POA: Diagnosis not present

## 2019-02-26 DIAGNOSIS — G300 Alzheimer's disease with early onset: Secondary | ICD-10-CM | POA: Diagnosis not present

## 2019-02-26 DIAGNOSIS — R410 Disorientation, unspecified: Secondary | ICD-10-CM | POA: Diagnosis not present

## 2019-02-26 DIAGNOSIS — I6523 Occlusion and stenosis of bilateral carotid arteries: Secondary | ICD-10-CM | POA: Diagnosis not present

## 2019-02-26 DIAGNOSIS — Z66 Do not resuscitate: Secondary | ICD-10-CM | POA: Diagnosis not present

## 2019-02-26 DIAGNOSIS — Z515 Encounter for palliative care: Secondary | ICD-10-CM | POA: Diagnosis not present

## 2019-02-26 DIAGNOSIS — R296 Repeated falls: Secondary | ICD-10-CM | POA: Diagnosis not present

## 2019-02-26 DIAGNOSIS — R451 Restlessness and agitation: Secondary | ICD-10-CM | POA: Diagnosis not present

## 2019-02-26 DIAGNOSIS — I779 Disorder of arteries and arterioles, unspecified: Secondary | ICD-10-CM | POA: Diagnosis not present

## 2019-02-27 DIAGNOSIS — I779 Disorder of arteries and arterioles, unspecified: Secondary | ICD-10-CM | POA: Diagnosis not present

## 2019-02-27 DIAGNOSIS — R634 Abnormal weight loss: Secondary | ICD-10-CM | POA: Diagnosis not present

## 2019-02-27 DIAGNOSIS — I10 Essential (primary) hypertension: Secondary | ICD-10-CM | POA: Diagnosis not present

## 2019-02-27 DIAGNOSIS — Z515 Encounter for palliative care: Secondary | ICD-10-CM | POA: Diagnosis not present

## 2019-02-27 DIAGNOSIS — R296 Repeated falls: Secondary | ICD-10-CM | POA: Diagnosis not present

## 2019-02-27 DIAGNOSIS — F0280 Dementia in other diseases classified elsewhere without behavioral disturbance: Secondary | ICD-10-CM | POA: Diagnosis not present

## 2019-02-27 DIAGNOSIS — Z66 Do not resuscitate: Secondary | ICD-10-CM | POA: Diagnosis not present

## 2019-02-27 DIAGNOSIS — R451 Restlessness and agitation: Secondary | ICD-10-CM | POA: Diagnosis not present

## 2019-02-27 DIAGNOSIS — G300 Alzheimer's disease with early onset: Secondary | ICD-10-CM | POA: Diagnosis not present

## 2019-02-27 DIAGNOSIS — R410 Disorientation, unspecified: Secondary | ICD-10-CM | POA: Diagnosis not present

## 2019-02-27 DIAGNOSIS — I6523 Occlusion and stenosis of bilateral carotid arteries: Secondary | ICD-10-CM | POA: Diagnosis not present

## 2019-02-28 DIAGNOSIS — Z66 Do not resuscitate: Secondary | ICD-10-CM | POA: Diagnosis not present

## 2019-02-28 DIAGNOSIS — I6523 Occlusion and stenosis of bilateral carotid arteries: Secondary | ICD-10-CM | POA: Diagnosis not present

## 2019-02-28 DIAGNOSIS — I10 Essential (primary) hypertension: Secondary | ICD-10-CM | POA: Diagnosis not present

## 2019-02-28 DIAGNOSIS — R296 Repeated falls: Secondary | ICD-10-CM | POA: Diagnosis not present

## 2019-02-28 DIAGNOSIS — R634 Abnormal weight loss: Secondary | ICD-10-CM | POA: Diagnosis not present

## 2019-02-28 DIAGNOSIS — G300 Alzheimer's disease with early onset: Secondary | ICD-10-CM | POA: Diagnosis not present

## 2019-02-28 DIAGNOSIS — Z515 Encounter for palliative care: Secondary | ICD-10-CM | POA: Diagnosis not present

## 2019-02-28 DIAGNOSIS — R451 Restlessness and agitation: Secondary | ICD-10-CM | POA: Diagnosis not present

## 2019-02-28 DIAGNOSIS — R410 Disorientation, unspecified: Secondary | ICD-10-CM | POA: Diagnosis not present

## 2019-02-28 DIAGNOSIS — F0280 Dementia in other diseases classified elsewhere without behavioral disturbance: Secondary | ICD-10-CM | POA: Diagnosis not present

## 2019-02-28 DIAGNOSIS — I779 Disorder of arteries and arterioles, unspecified: Secondary | ICD-10-CM | POA: Diagnosis not present

## 2019-03-01 DIAGNOSIS — I779 Disorder of arteries and arterioles, unspecified: Secondary | ICD-10-CM | POA: Diagnosis not present

## 2019-03-01 DIAGNOSIS — R410 Disorientation, unspecified: Secondary | ICD-10-CM | POA: Diagnosis not present

## 2019-03-01 DIAGNOSIS — Z66 Do not resuscitate: Secondary | ICD-10-CM | POA: Diagnosis not present

## 2019-03-01 DIAGNOSIS — R296 Repeated falls: Secondary | ICD-10-CM | POA: Diagnosis not present

## 2019-03-01 DIAGNOSIS — I6523 Occlusion and stenosis of bilateral carotid arteries: Secondary | ICD-10-CM | POA: Diagnosis not present

## 2019-03-01 DIAGNOSIS — F0280 Dementia in other diseases classified elsewhere without behavioral disturbance: Secondary | ICD-10-CM | POA: Diagnosis not present

## 2019-03-01 DIAGNOSIS — G300 Alzheimer's disease with early onset: Secondary | ICD-10-CM | POA: Diagnosis not present

## 2019-03-01 DIAGNOSIS — R451 Restlessness and agitation: Secondary | ICD-10-CM | POA: Diagnosis not present

## 2019-03-01 DIAGNOSIS — I10 Essential (primary) hypertension: Secondary | ICD-10-CM | POA: Diagnosis not present

## 2019-03-01 DIAGNOSIS — R634 Abnormal weight loss: Secondary | ICD-10-CM | POA: Diagnosis not present

## 2019-03-01 DIAGNOSIS — Z515 Encounter for palliative care: Secondary | ICD-10-CM | POA: Diagnosis not present

## 2019-03-02 DIAGNOSIS — R451 Restlessness and agitation: Secondary | ICD-10-CM | POA: Diagnosis not present

## 2019-03-02 DIAGNOSIS — R296 Repeated falls: Secondary | ICD-10-CM | POA: Diagnosis not present

## 2019-03-02 DIAGNOSIS — F0280 Dementia in other diseases classified elsewhere without behavioral disturbance: Secondary | ICD-10-CM | POA: Diagnosis not present

## 2019-03-02 DIAGNOSIS — G300 Alzheimer's disease with early onset: Secondary | ICD-10-CM | POA: Diagnosis not present

## 2019-03-02 DIAGNOSIS — I10 Essential (primary) hypertension: Secondary | ICD-10-CM | POA: Diagnosis not present

## 2019-03-02 DIAGNOSIS — I779 Disorder of arteries and arterioles, unspecified: Secondary | ICD-10-CM | POA: Diagnosis not present

## 2019-03-02 DIAGNOSIS — Z66 Do not resuscitate: Secondary | ICD-10-CM | POA: Diagnosis not present

## 2019-03-02 DIAGNOSIS — I6523 Occlusion and stenosis of bilateral carotid arteries: Secondary | ICD-10-CM | POA: Diagnosis not present

## 2019-03-02 DIAGNOSIS — R634 Abnormal weight loss: Secondary | ICD-10-CM | POA: Diagnosis not present

## 2019-03-02 DIAGNOSIS — R410 Disorientation, unspecified: Secondary | ICD-10-CM | POA: Diagnosis not present

## 2019-03-02 DIAGNOSIS — Z515 Encounter for palliative care: Secondary | ICD-10-CM | POA: Diagnosis not present

## 2019-03-03 DIAGNOSIS — I6523 Occlusion and stenosis of bilateral carotid arteries: Secondary | ICD-10-CM | POA: Diagnosis not present

## 2019-03-03 DIAGNOSIS — R634 Abnormal weight loss: Secondary | ICD-10-CM | POA: Diagnosis not present

## 2019-03-03 DIAGNOSIS — I10 Essential (primary) hypertension: Secondary | ICD-10-CM | POA: Diagnosis not present

## 2019-03-03 DIAGNOSIS — R296 Repeated falls: Secondary | ICD-10-CM | POA: Diagnosis not present

## 2019-03-03 DIAGNOSIS — F0280 Dementia in other diseases classified elsewhere without behavioral disturbance: Secondary | ICD-10-CM | POA: Diagnosis not present

## 2019-03-03 DIAGNOSIS — I779 Disorder of arteries and arterioles, unspecified: Secondary | ICD-10-CM | POA: Diagnosis not present

## 2019-03-03 DIAGNOSIS — R451 Restlessness and agitation: Secondary | ICD-10-CM | POA: Diagnosis not present

## 2019-03-03 DIAGNOSIS — Z66 Do not resuscitate: Secondary | ICD-10-CM | POA: Diagnosis not present

## 2019-03-03 DIAGNOSIS — Z515 Encounter for palliative care: Secondary | ICD-10-CM | POA: Diagnosis not present

## 2019-03-03 DIAGNOSIS — G300 Alzheimer's disease with early onset: Secondary | ICD-10-CM | POA: Diagnosis not present

## 2019-03-03 DIAGNOSIS — R410 Disorientation, unspecified: Secondary | ICD-10-CM | POA: Diagnosis not present

## 2019-03-04 DIAGNOSIS — R296 Repeated falls: Secondary | ICD-10-CM | POA: Diagnosis not present

## 2019-03-04 DIAGNOSIS — I10 Essential (primary) hypertension: Secondary | ICD-10-CM | POA: Diagnosis not present

## 2019-03-04 DIAGNOSIS — R451 Restlessness and agitation: Secondary | ICD-10-CM | POA: Diagnosis not present

## 2019-03-04 DIAGNOSIS — I6523 Occlusion and stenosis of bilateral carotid arteries: Secondary | ICD-10-CM | POA: Diagnosis not present

## 2019-03-04 DIAGNOSIS — R634 Abnormal weight loss: Secondary | ICD-10-CM | POA: Diagnosis not present

## 2019-03-04 DIAGNOSIS — G300 Alzheimer's disease with early onset: Secondary | ICD-10-CM | POA: Diagnosis not present

## 2019-03-04 DIAGNOSIS — Z515 Encounter for palliative care: Secondary | ICD-10-CM | POA: Diagnosis not present

## 2019-03-04 DIAGNOSIS — R410 Disorientation, unspecified: Secondary | ICD-10-CM | POA: Diagnosis not present

## 2019-03-04 DIAGNOSIS — I779 Disorder of arteries and arterioles, unspecified: Secondary | ICD-10-CM | POA: Diagnosis not present

## 2019-03-04 DIAGNOSIS — Z66 Do not resuscitate: Secondary | ICD-10-CM | POA: Diagnosis not present

## 2019-03-04 DIAGNOSIS — F0280 Dementia in other diseases classified elsewhere without behavioral disturbance: Secondary | ICD-10-CM | POA: Diagnosis not present

## 2019-03-05 DIAGNOSIS — G300 Alzheimer's disease with early onset: Secondary | ICD-10-CM | POA: Diagnosis not present

## 2019-03-05 DIAGNOSIS — Z515 Encounter for palliative care: Secondary | ICD-10-CM | POA: Diagnosis not present

## 2019-03-05 DIAGNOSIS — R296 Repeated falls: Secondary | ICD-10-CM | POA: Diagnosis not present

## 2019-03-05 DIAGNOSIS — I10 Essential (primary) hypertension: Secondary | ICD-10-CM | POA: Diagnosis not present

## 2019-03-05 DIAGNOSIS — R410 Disorientation, unspecified: Secondary | ICD-10-CM | POA: Diagnosis not present

## 2019-03-05 DIAGNOSIS — R451 Restlessness and agitation: Secondary | ICD-10-CM | POA: Diagnosis not present

## 2019-03-05 DIAGNOSIS — Z66 Do not resuscitate: Secondary | ICD-10-CM | POA: Diagnosis not present

## 2019-03-05 DIAGNOSIS — F0280 Dementia in other diseases classified elsewhere without behavioral disturbance: Secondary | ICD-10-CM | POA: Diagnosis not present

## 2019-03-05 DIAGNOSIS — I6523 Occlusion and stenosis of bilateral carotid arteries: Secondary | ICD-10-CM | POA: Diagnosis not present

## 2019-03-05 DIAGNOSIS — R634 Abnormal weight loss: Secondary | ICD-10-CM | POA: Diagnosis not present

## 2019-03-05 DIAGNOSIS — I779 Disorder of arteries and arterioles, unspecified: Secondary | ICD-10-CM | POA: Diagnosis not present

## 2019-03-06 DIAGNOSIS — F0280 Dementia in other diseases classified elsewhere without behavioral disturbance: Secondary | ICD-10-CM | POA: Diagnosis not present

## 2019-03-06 DIAGNOSIS — R451 Restlessness and agitation: Secondary | ICD-10-CM | POA: Diagnosis not present

## 2019-03-06 DIAGNOSIS — R634 Abnormal weight loss: Secondary | ICD-10-CM | POA: Diagnosis not present

## 2019-03-06 DIAGNOSIS — Z515 Encounter for palliative care: Secondary | ICD-10-CM | POA: Diagnosis not present

## 2019-03-06 DIAGNOSIS — I779 Disorder of arteries and arterioles, unspecified: Secondary | ICD-10-CM | POA: Diagnosis not present

## 2019-03-06 DIAGNOSIS — G300 Alzheimer's disease with early onset: Secondary | ICD-10-CM | POA: Diagnosis not present

## 2019-03-06 DIAGNOSIS — R410 Disorientation, unspecified: Secondary | ICD-10-CM | POA: Diagnosis not present

## 2019-03-06 DIAGNOSIS — I10 Essential (primary) hypertension: Secondary | ICD-10-CM | POA: Diagnosis not present

## 2019-03-06 DIAGNOSIS — I6523 Occlusion and stenosis of bilateral carotid arteries: Secondary | ICD-10-CM | POA: Diagnosis not present

## 2019-03-06 DIAGNOSIS — Z66 Do not resuscitate: Secondary | ICD-10-CM | POA: Diagnosis not present

## 2019-03-06 DIAGNOSIS — R296 Repeated falls: Secondary | ICD-10-CM | POA: Diagnosis not present

## 2019-03-07 DIAGNOSIS — R451 Restlessness and agitation: Secondary | ICD-10-CM | POA: Diagnosis not present

## 2019-03-07 DIAGNOSIS — R634 Abnormal weight loss: Secondary | ICD-10-CM | POA: Diagnosis not present

## 2019-03-07 DIAGNOSIS — I6523 Occlusion and stenosis of bilateral carotid arteries: Secondary | ICD-10-CM | POA: Diagnosis not present

## 2019-03-07 DIAGNOSIS — I10 Essential (primary) hypertension: Secondary | ICD-10-CM | POA: Diagnosis not present

## 2019-03-07 DIAGNOSIS — R410 Disorientation, unspecified: Secondary | ICD-10-CM | POA: Diagnosis not present

## 2019-03-07 DIAGNOSIS — I779 Disorder of arteries and arterioles, unspecified: Secondary | ICD-10-CM | POA: Diagnosis not present

## 2019-03-07 DIAGNOSIS — Z515 Encounter for palliative care: Secondary | ICD-10-CM | POA: Diagnosis not present

## 2019-03-07 DIAGNOSIS — F0280 Dementia in other diseases classified elsewhere without behavioral disturbance: Secondary | ICD-10-CM | POA: Diagnosis not present

## 2019-03-07 DIAGNOSIS — R296 Repeated falls: Secondary | ICD-10-CM | POA: Diagnosis not present

## 2019-03-07 DIAGNOSIS — Z66 Do not resuscitate: Secondary | ICD-10-CM | POA: Diagnosis not present

## 2019-03-07 DIAGNOSIS — G300 Alzheimer's disease with early onset: Secondary | ICD-10-CM | POA: Diagnosis not present

## 2019-03-08 DIAGNOSIS — R451 Restlessness and agitation: Secondary | ICD-10-CM | POA: Diagnosis not present

## 2019-03-08 DIAGNOSIS — R296 Repeated falls: Secondary | ICD-10-CM | POA: Diagnosis not present

## 2019-03-08 DIAGNOSIS — I779 Disorder of arteries and arterioles, unspecified: Secondary | ICD-10-CM | POA: Diagnosis not present

## 2019-03-08 DIAGNOSIS — R410 Disorientation, unspecified: Secondary | ICD-10-CM | POA: Diagnosis not present

## 2019-03-08 DIAGNOSIS — G300 Alzheimer's disease with early onset: Secondary | ICD-10-CM | POA: Diagnosis not present

## 2019-03-08 DIAGNOSIS — Z515 Encounter for palliative care: Secondary | ICD-10-CM | POA: Diagnosis not present

## 2019-03-08 DIAGNOSIS — F0280 Dementia in other diseases classified elsewhere without behavioral disturbance: Secondary | ICD-10-CM | POA: Diagnosis not present

## 2019-03-08 DIAGNOSIS — Z66 Do not resuscitate: Secondary | ICD-10-CM | POA: Diagnosis not present

## 2019-03-08 DIAGNOSIS — R634 Abnormal weight loss: Secondary | ICD-10-CM | POA: Diagnosis not present

## 2019-03-08 DIAGNOSIS — I10 Essential (primary) hypertension: Secondary | ICD-10-CM | POA: Diagnosis not present

## 2019-03-08 DIAGNOSIS — I6523 Occlusion and stenosis of bilateral carotid arteries: Secondary | ICD-10-CM | POA: Diagnosis not present

## 2019-03-09 DIAGNOSIS — R410 Disorientation, unspecified: Secondary | ICD-10-CM | POA: Diagnosis not present

## 2019-03-09 DIAGNOSIS — I779 Disorder of arteries and arterioles, unspecified: Secondary | ICD-10-CM | POA: Diagnosis not present

## 2019-03-09 DIAGNOSIS — I6523 Occlusion and stenosis of bilateral carotid arteries: Secondary | ICD-10-CM | POA: Diagnosis not present

## 2019-03-09 DIAGNOSIS — R634 Abnormal weight loss: Secondary | ICD-10-CM | POA: Diagnosis not present

## 2019-03-09 DIAGNOSIS — R296 Repeated falls: Secondary | ICD-10-CM | POA: Diagnosis not present

## 2019-03-09 DIAGNOSIS — R451 Restlessness and agitation: Secondary | ICD-10-CM | POA: Diagnosis not present

## 2019-03-09 DIAGNOSIS — Z66 Do not resuscitate: Secondary | ICD-10-CM | POA: Diagnosis not present

## 2019-03-09 DIAGNOSIS — I10 Essential (primary) hypertension: Secondary | ICD-10-CM | POA: Diagnosis not present

## 2019-03-09 DIAGNOSIS — F0280 Dementia in other diseases classified elsewhere without behavioral disturbance: Secondary | ICD-10-CM | POA: Diagnosis not present

## 2019-03-09 DIAGNOSIS — Z515 Encounter for palliative care: Secondary | ICD-10-CM | POA: Diagnosis not present

## 2019-03-09 DIAGNOSIS — G300 Alzheimer's disease with early onset: Secondary | ICD-10-CM | POA: Diagnosis not present

## 2019-03-10 DIAGNOSIS — F0280 Dementia in other diseases classified elsewhere without behavioral disturbance: Secondary | ICD-10-CM | POA: Diagnosis not present

## 2019-03-10 DIAGNOSIS — G300 Alzheimer's disease with early onset: Secondary | ICD-10-CM | POA: Diagnosis not present

## 2019-03-10 DIAGNOSIS — I779 Disorder of arteries and arterioles, unspecified: Secondary | ICD-10-CM | POA: Diagnosis not present

## 2019-03-10 DIAGNOSIS — R410 Disorientation, unspecified: Secondary | ICD-10-CM | POA: Diagnosis not present

## 2019-03-10 DIAGNOSIS — R451 Restlessness and agitation: Secondary | ICD-10-CM | POA: Diagnosis not present

## 2019-03-10 DIAGNOSIS — I6523 Occlusion and stenosis of bilateral carotid arteries: Secondary | ICD-10-CM | POA: Diagnosis not present

## 2019-03-10 DIAGNOSIS — I10 Essential (primary) hypertension: Secondary | ICD-10-CM | POA: Diagnosis not present

## 2019-03-10 DIAGNOSIS — Z66 Do not resuscitate: Secondary | ICD-10-CM | POA: Diagnosis not present

## 2019-03-10 DIAGNOSIS — Z515 Encounter for palliative care: Secondary | ICD-10-CM | POA: Diagnosis not present

## 2019-03-10 DIAGNOSIS — R296 Repeated falls: Secondary | ICD-10-CM | POA: Diagnosis not present

## 2019-03-10 DIAGNOSIS — R634 Abnormal weight loss: Secondary | ICD-10-CM | POA: Diagnosis not present

## 2019-03-11 DIAGNOSIS — I6523 Occlusion and stenosis of bilateral carotid arteries: Secondary | ICD-10-CM | POA: Diagnosis not present

## 2019-03-11 DIAGNOSIS — Z66 Do not resuscitate: Secondary | ICD-10-CM | POA: Diagnosis not present

## 2019-03-11 DIAGNOSIS — R634 Abnormal weight loss: Secondary | ICD-10-CM | POA: Diagnosis not present

## 2019-03-11 DIAGNOSIS — R296 Repeated falls: Secondary | ICD-10-CM | POA: Diagnosis not present

## 2019-03-11 DIAGNOSIS — R451 Restlessness and agitation: Secondary | ICD-10-CM | POA: Diagnosis not present

## 2019-03-11 DIAGNOSIS — F0280 Dementia in other diseases classified elsewhere without behavioral disturbance: Secondary | ICD-10-CM | POA: Diagnosis not present

## 2019-03-11 DIAGNOSIS — I779 Disorder of arteries and arterioles, unspecified: Secondary | ICD-10-CM | POA: Diagnosis not present

## 2019-03-11 DIAGNOSIS — R410 Disorientation, unspecified: Secondary | ICD-10-CM | POA: Diagnosis not present

## 2019-03-11 DIAGNOSIS — I10 Essential (primary) hypertension: Secondary | ICD-10-CM | POA: Diagnosis not present

## 2019-03-11 DIAGNOSIS — G300 Alzheimer's disease with early onset: Secondary | ICD-10-CM | POA: Diagnosis not present

## 2019-03-11 DIAGNOSIS — Z515 Encounter for palliative care: Secondary | ICD-10-CM | POA: Diagnosis not present

## 2019-03-12 DIAGNOSIS — G300 Alzheimer's disease with early onset: Secondary | ICD-10-CM | POA: Diagnosis not present

## 2019-03-12 DIAGNOSIS — Z515 Encounter for palliative care: Secondary | ICD-10-CM | POA: Diagnosis not present

## 2019-03-12 DIAGNOSIS — I6523 Occlusion and stenosis of bilateral carotid arteries: Secondary | ICD-10-CM | POA: Diagnosis not present

## 2019-03-12 DIAGNOSIS — R634 Abnormal weight loss: Secondary | ICD-10-CM | POA: Diagnosis not present

## 2019-03-12 DIAGNOSIS — I10 Essential (primary) hypertension: Secondary | ICD-10-CM | POA: Diagnosis not present

## 2019-03-12 DIAGNOSIS — F0280 Dementia in other diseases classified elsewhere without behavioral disturbance: Secondary | ICD-10-CM | POA: Diagnosis not present

## 2019-03-12 DIAGNOSIS — R54 Age-related physical debility: Secondary | ICD-10-CM | POA: Diagnosis not present

## 2019-03-12 DIAGNOSIS — R627 Adult failure to thrive: Secondary | ICD-10-CM | POA: Diagnosis not present

## 2019-03-12 DIAGNOSIS — R451 Restlessness and agitation: Secondary | ICD-10-CM | POA: Diagnosis not present

## 2019-03-12 DIAGNOSIS — I779 Disorder of arteries and arterioles, unspecified: Secondary | ICD-10-CM | POA: Diagnosis not present

## 2019-03-12 DIAGNOSIS — Z66 Do not resuscitate: Secondary | ICD-10-CM | POA: Diagnosis not present

## 2019-03-12 DIAGNOSIS — R410 Disorientation, unspecified: Secondary | ICD-10-CM | POA: Diagnosis not present

## 2019-03-12 DIAGNOSIS — E46 Unspecified protein-calorie malnutrition: Secondary | ICD-10-CM | POA: Diagnosis not present

## 2019-03-12 DIAGNOSIS — R296 Repeated falls: Secondary | ICD-10-CM | POA: Diagnosis not present

## 2019-03-12 DIAGNOSIS — R32 Unspecified urinary incontinence: Secondary | ICD-10-CM | POA: Diagnosis not present

## 2019-03-13 DIAGNOSIS — I779 Disorder of arteries and arterioles, unspecified: Secondary | ICD-10-CM | POA: Diagnosis not present

## 2019-03-13 DIAGNOSIS — I6523 Occlusion and stenosis of bilateral carotid arteries: Secondary | ICD-10-CM | POA: Diagnosis not present

## 2019-03-13 DIAGNOSIS — R451 Restlessness and agitation: Secondary | ICD-10-CM | POA: Diagnosis not present

## 2019-03-13 DIAGNOSIS — Z515 Encounter for palliative care: Secondary | ICD-10-CM | POA: Diagnosis not present

## 2019-03-13 DIAGNOSIS — R410 Disorientation, unspecified: Secondary | ICD-10-CM | POA: Diagnosis not present

## 2019-03-13 DIAGNOSIS — G300 Alzheimer's disease with early onset: Secondary | ICD-10-CM | POA: Diagnosis not present

## 2019-03-13 DIAGNOSIS — R634 Abnormal weight loss: Secondary | ICD-10-CM | POA: Diagnosis not present

## 2019-03-13 DIAGNOSIS — F0280 Dementia in other diseases classified elsewhere without behavioral disturbance: Secondary | ICD-10-CM | POA: Diagnosis not present

## 2019-03-13 DIAGNOSIS — Z66 Do not resuscitate: Secondary | ICD-10-CM | POA: Diagnosis not present

## 2019-03-13 DIAGNOSIS — R296 Repeated falls: Secondary | ICD-10-CM | POA: Diagnosis not present

## 2019-03-13 DIAGNOSIS — I10 Essential (primary) hypertension: Secondary | ICD-10-CM | POA: Diagnosis not present

## 2019-03-14 DIAGNOSIS — I779 Disorder of arteries and arterioles, unspecified: Secondary | ICD-10-CM | POA: Diagnosis not present

## 2019-03-14 DIAGNOSIS — I10 Essential (primary) hypertension: Secondary | ICD-10-CM | POA: Diagnosis not present

## 2019-03-14 DIAGNOSIS — R296 Repeated falls: Secondary | ICD-10-CM | POA: Diagnosis not present

## 2019-03-14 DIAGNOSIS — I6523 Occlusion and stenosis of bilateral carotid arteries: Secondary | ICD-10-CM | POA: Diagnosis not present

## 2019-03-14 DIAGNOSIS — R410 Disorientation, unspecified: Secondary | ICD-10-CM | POA: Diagnosis not present

## 2019-03-14 DIAGNOSIS — Z66 Do not resuscitate: Secondary | ICD-10-CM | POA: Diagnosis not present

## 2019-03-14 DIAGNOSIS — R451 Restlessness and agitation: Secondary | ICD-10-CM | POA: Diagnosis not present

## 2019-03-14 DIAGNOSIS — Z515 Encounter for palliative care: Secondary | ICD-10-CM | POA: Diagnosis not present

## 2019-03-14 DIAGNOSIS — F0280 Dementia in other diseases classified elsewhere without behavioral disturbance: Secondary | ICD-10-CM | POA: Diagnosis not present

## 2019-03-14 DIAGNOSIS — G300 Alzheimer's disease with early onset: Secondary | ICD-10-CM | POA: Diagnosis not present

## 2019-03-14 DIAGNOSIS — R634 Abnormal weight loss: Secondary | ICD-10-CM | POA: Diagnosis not present

## 2019-03-15 DIAGNOSIS — I10 Essential (primary) hypertension: Secondary | ICD-10-CM | POA: Diagnosis not present

## 2019-03-15 DIAGNOSIS — R451 Restlessness and agitation: Secondary | ICD-10-CM | POA: Diagnosis not present

## 2019-03-15 DIAGNOSIS — G300 Alzheimer's disease with early onset: Secondary | ICD-10-CM | POA: Diagnosis not present

## 2019-03-15 DIAGNOSIS — Z66 Do not resuscitate: Secondary | ICD-10-CM | POA: Diagnosis not present

## 2019-03-15 DIAGNOSIS — R634 Abnormal weight loss: Secondary | ICD-10-CM | POA: Diagnosis not present

## 2019-03-15 DIAGNOSIS — R410 Disorientation, unspecified: Secondary | ICD-10-CM | POA: Diagnosis not present

## 2019-03-15 DIAGNOSIS — I779 Disorder of arteries and arterioles, unspecified: Secondary | ICD-10-CM | POA: Diagnosis not present

## 2019-03-15 DIAGNOSIS — F0280 Dementia in other diseases classified elsewhere without behavioral disturbance: Secondary | ICD-10-CM | POA: Diagnosis not present

## 2019-03-15 DIAGNOSIS — Z515 Encounter for palliative care: Secondary | ICD-10-CM | POA: Diagnosis not present

## 2019-03-15 DIAGNOSIS — I6523 Occlusion and stenosis of bilateral carotid arteries: Secondary | ICD-10-CM | POA: Diagnosis not present

## 2019-03-15 DIAGNOSIS — R296 Repeated falls: Secondary | ICD-10-CM | POA: Diagnosis not present

## 2019-03-16 DIAGNOSIS — I779 Disorder of arteries and arterioles, unspecified: Secondary | ICD-10-CM | POA: Diagnosis not present

## 2019-03-16 DIAGNOSIS — Z515 Encounter for palliative care: Secondary | ICD-10-CM | POA: Diagnosis not present

## 2019-03-16 DIAGNOSIS — R634 Abnormal weight loss: Secondary | ICD-10-CM | POA: Diagnosis not present

## 2019-03-16 DIAGNOSIS — Z66 Do not resuscitate: Secondary | ICD-10-CM | POA: Diagnosis not present

## 2019-03-16 DIAGNOSIS — R451 Restlessness and agitation: Secondary | ICD-10-CM | POA: Diagnosis not present

## 2019-03-16 DIAGNOSIS — R410 Disorientation, unspecified: Secondary | ICD-10-CM | POA: Diagnosis not present

## 2019-03-16 DIAGNOSIS — F0280 Dementia in other diseases classified elsewhere without behavioral disturbance: Secondary | ICD-10-CM | POA: Diagnosis not present

## 2019-03-16 DIAGNOSIS — I6523 Occlusion and stenosis of bilateral carotid arteries: Secondary | ICD-10-CM | POA: Diagnosis not present

## 2019-03-16 DIAGNOSIS — R296 Repeated falls: Secondary | ICD-10-CM | POA: Diagnosis not present

## 2019-03-16 DIAGNOSIS — G300 Alzheimer's disease with early onset: Secondary | ICD-10-CM | POA: Diagnosis not present

## 2019-03-16 DIAGNOSIS — I10 Essential (primary) hypertension: Secondary | ICD-10-CM | POA: Diagnosis not present

## 2019-03-17 DIAGNOSIS — I779 Disorder of arteries and arterioles, unspecified: Secondary | ICD-10-CM | POA: Diagnosis not present

## 2019-03-17 DIAGNOSIS — Z66 Do not resuscitate: Secondary | ICD-10-CM | POA: Diagnosis not present

## 2019-03-17 DIAGNOSIS — R451 Restlessness and agitation: Secondary | ICD-10-CM | POA: Diagnosis not present

## 2019-03-17 DIAGNOSIS — I6523 Occlusion and stenosis of bilateral carotid arteries: Secondary | ICD-10-CM | POA: Diagnosis not present

## 2019-03-17 DIAGNOSIS — G300 Alzheimer's disease with early onset: Secondary | ICD-10-CM | POA: Diagnosis not present

## 2019-03-17 DIAGNOSIS — R410 Disorientation, unspecified: Secondary | ICD-10-CM | POA: Diagnosis not present

## 2019-03-17 DIAGNOSIS — I10 Essential (primary) hypertension: Secondary | ICD-10-CM | POA: Diagnosis not present

## 2019-03-17 DIAGNOSIS — R634 Abnormal weight loss: Secondary | ICD-10-CM | POA: Diagnosis not present

## 2019-03-17 DIAGNOSIS — R296 Repeated falls: Secondary | ICD-10-CM | POA: Diagnosis not present

## 2019-03-17 DIAGNOSIS — F0280 Dementia in other diseases classified elsewhere without behavioral disturbance: Secondary | ICD-10-CM | POA: Diagnosis not present

## 2019-03-17 DIAGNOSIS — Z515 Encounter for palliative care: Secondary | ICD-10-CM | POA: Diagnosis not present

## 2019-03-18 DIAGNOSIS — R451 Restlessness and agitation: Secondary | ICD-10-CM | POA: Diagnosis not present

## 2019-03-18 DIAGNOSIS — R296 Repeated falls: Secondary | ICD-10-CM | POA: Diagnosis not present

## 2019-03-18 DIAGNOSIS — R634 Abnormal weight loss: Secondary | ICD-10-CM | POA: Diagnosis not present

## 2019-03-18 DIAGNOSIS — R410 Disorientation, unspecified: Secondary | ICD-10-CM | POA: Diagnosis not present

## 2019-03-18 DIAGNOSIS — Z515 Encounter for palliative care: Secondary | ICD-10-CM | POA: Diagnosis not present

## 2019-03-18 DIAGNOSIS — I10 Essential (primary) hypertension: Secondary | ICD-10-CM | POA: Diagnosis not present

## 2019-03-18 DIAGNOSIS — G300 Alzheimer's disease with early onset: Secondary | ICD-10-CM | POA: Diagnosis not present

## 2019-03-18 DIAGNOSIS — I779 Disorder of arteries and arterioles, unspecified: Secondary | ICD-10-CM | POA: Diagnosis not present

## 2019-03-18 DIAGNOSIS — I6523 Occlusion and stenosis of bilateral carotid arteries: Secondary | ICD-10-CM | POA: Diagnosis not present

## 2019-03-18 DIAGNOSIS — F0280 Dementia in other diseases classified elsewhere without behavioral disturbance: Secondary | ICD-10-CM | POA: Diagnosis not present

## 2019-03-18 DIAGNOSIS — Z66 Do not resuscitate: Secondary | ICD-10-CM | POA: Diagnosis not present

## 2019-03-19 DIAGNOSIS — Z66 Do not resuscitate: Secondary | ICD-10-CM | POA: Diagnosis not present

## 2019-03-19 DIAGNOSIS — F0280 Dementia in other diseases classified elsewhere without behavioral disturbance: Secondary | ICD-10-CM | POA: Diagnosis not present

## 2019-03-19 DIAGNOSIS — R634 Abnormal weight loss: Secondary | ICD-10-CM | POA: Diagnosis not present

## 2019-03-19 DIAGNOSIS — I779 Disorder of arteries and arterioles, unspecified: Secondary | ICD-10-CM | POA: Diagnosis not present

## 2019-03-19 DIAGNOSIS — R296 Repeated falls: Secondary | ICD-10-CM | POA: Diagnosis not present

## 2019-03-19 DIAGNOSIS — I6523 Occlusion and stenosis of bilateral carotid arteries: Secondary | ICD-10-CM | POA: Diagnosis not present

## 2019-03-19 DIAGNOSIS — I10 Essential (primary) hypertension: Secondary | ICD-10-CM | POA: Diagnosis not present

## 2019-03-19 DIAGNOSIS — R451 Restlessness and agitation: Secondary | ICD-10-CM | POA: Diagnosis not present

## 2019-03-19 DIAGNOSIS — R410 Disorientation, unspecified: Secondary | ICD-10-CM | POA: Diagnosis not present

## 2019-03-19 DIAGNOSIS — G300 Alzheimer's disease with early onset: Secondary | ICD-10-CM | POA: Diagnosis not present

## 2019-03-19 DIAGNOSIS — Z515 Encounter for palliative care: Secondary | ICD-10-CM | POA: Diagnosis not present

## 2019-03-20 DIAGNOSIS — Z515 Encounter for palliative care: Secondary | ICD-10-CM | POA: Diagnosis not present

## 2019-03-20 DIAGNOSIS — R296 Repeated falls: Secondary | ICD-10-CM | POA: Diagnosis not present

## 2019-03-20 DIAGNOSIS — F0280 Dementia in other diseases classified elsewhere without behavioral disturbance: Secondary | ICD-10-CM | POA: Diagnosis not present

## 2019-03-20 DIAGNOSIS — I779 Disorder of arteries and arterioles, unspecified: Secondary | ICD-10-CM | POA: Diagnosis not present

## 2019-03-20 DIAGNOSIS — Z66 Do not resuscitate: Secondary | ICD-10-CM | POA: Diagnosis not present

## 2019-03-20 DIAGNOSIS — R410 Disorientation, unspecified: Secondary | ICD-10-CM | POA: Diagnosis not present

## 2019-03-20 DIAGNOSIS — G300 Alzheimer's disease with early onset: Secondary | ICD-10-CM | POA: Diagnosis not present

## 2019-03-20 DIAGNOSIS — I10 Essential (primary) hypertension: Secondary | ICD-10-CM | POA: Diagnosis not present

## 2019-03-20 DIAGNOSIS — R634 Abnormal weight loss: Secondary | ICD-10-CM | POA: Diagnosis not present

## 2019-03-20 DIAGNOSIS — R451 Restlessness and agitation: Secondary | ICD-10-CM | POA: Diagnosis not present

## 2019-03-20 DIAGNOSIS — I6523 Occlusion and stenosis of bilateral carotid arteries: Secondary | ICD-10-CM | POA: Diagnosis not present

## 2019-03-21 DIAGNOSIS — F0280 Dementia in other diseases classified elsewhere without behavioral disturbance: Secondary | ICD-10-CM | POA: Diagnosis not present

## 2019-03-21 DIAGNOSIS — I779 Disorder of arteries and arterioles, unspecified: Secondary | ICD-10-CM | POA: Diagnosis not present

## 2019-03-21 DIAGNOSIS — I10 Essential (primary) hypertension: Secondary | ICD-10-CM | POA: Diagnosis not present

## 2019-03-21 DIAGNOSIS — G300 Alzheimer's disease with early onset: Secondary | ICD-10-CM | POA: Diagnosis not present

## 2019-03-21 DIAGNOSIS — Z66 Do not resuscitate: Secondary | ICD-10-CM | POA: Diagnosis not present

## 2019-03-21 DIAGNOSIS — R634 Abnormal weight loss: Secondary | ICD-10-CM | POA: Diagnosis not present

## 2019-03-21 DIAGNOSIS — R451 Restlessness and agitation: Secondary | ICD-10-CM | POA: Diagnosis not present

## 2019-03-21 DIAGNOSIS — R410 Disorientation, unspecified: Secondary | ICD-10-CM | POA: Diagnosis not present

## 2019-03-21 DIAGNOSIS — Z515 Encounter for palliative care: Secondary | ICD-10-CM | POA: Diagnosis not present

## 2019-03-21 DIAGNOSIS — I6523 Occlusion and stenosis of bilateral carotid arteries: Secondary | ICD-10-CM | POA: Diagnosis not present

## 2019-03-21 DIAGNOSIS — R296 Repeated falls: Secondary | ICD-10-CM | POA: Diagnosis not present

## 2019-03-22 DIAGNOSIS — R296 Repeated falls: Secondary | ICD-10-CM | POA: Diagnosis not present

## 2019-03-22 DIAGNOSIS — Z515 Encounter for palliative care: Secondary | ICD-10-CM | POA: Diagnosis not present

## 2019-03-22 DIAGNOSIS — R451 Restlessness and agitation: Secondary | ICD-10-CM | POA: Diagnosis not present

## 2019-03-22 DIAGNOSIS — R634 Abnormal weight loss: Secondary | ICD-10-CM | POA: Diagnosis not present

## 2019-03-22 DIAGNOSIS — F0280 Dementia in other diseases classified elsewhere without behavioral disturbance: Secondary | ICD-10-CM | POA: Diagnosis not present

## 2019-03-22 DIAGNOSIS — I6523 Occlusion and stenosis of bilateral carotid arteries: Secondary | ICD-10-CM | POA: Diagnosis not present

## 2019-03-22 DIAGNOSIS — I10 Essential (primary) hypertension: Secondary | ICD-10-CM | POA: Diagnosis not present

## 2019-03-22 DIAGNOSIS — R410 Disorientation, unspecified: Secondary | ICD-10-CM | POA: Diagnosis not present

## 2019-03-22 DIAGNOSIS — Z66 Do not resuscitate: Secondary | ICD-10-CM | POA: Diagnosis not present

## 2019-03-22 DIAGNOSIS — I779 Disorder of arteries and arterioles, unspecified: Secondary | ICD-10-CM | POA: Diagnosis not present

## 2019-03-22 DIAGNOSIS — G300 Alzheimer's disease with early onset: Secondary | ICD-10-CM | POA: Diagnosis not present

## 2019-03-23 DIAGNOSIS — I6523 Occlusion and stenosis of bilateral carotid arteries: Secondary | ICD-10-CM | POA: Diagnosis not present

## 2019-03-23 DIAGNOSIS — Z515 Encounter for palliative care: Secondary | ICD-10-CM | POA: Diagnosis not present

## 2019-03-23 DIAGNOSIS — R634 Abnormal weight loss: Secondary | ICD-10-CM | POA: Diagnosis not present

## 2019-03-23 DIAGNOSIS — Z66 Do not resuscitate: Secondary | ICD-10-CM | POA: Diagnosis not present

## 2019-03-23 DIAGNOSIS — I10 Essential (primary) hypertension: Secondary | ICD-10-CM | POA: Diagnosis not present

## 2019-03-23 DIAGNOSIS — R451 Restlessness and agitation: Secondary | ICD-10-CM | POA: Diagnosis not present

## 2019-03-23 DIAGNOSIS — R296 Repeated falls: Secondary | ICD-10-CM | POA: Diagnosis not present

## 2019-03-23 DIAGNOSIS — G300 Alzheimer's disease with early onset: Secondary | ICD-10-CM | POA: Diagnosis not present

## 2019-03-23 DIAGNOSIS — F0280 Dementia in other diseases classified elsewhere without behavioral disturbance: Secondary | ICD-10-CM | POA: Diagnosis not present

## 2019-03-23 DIAGNOSIS — I779 Disorder of arteries and arterioles, unspecified: Secondary | ICD-10-CM | POA: Diagnosis not present

## 2019-03-23 DIAGNOSIS — R410 Disorientation, unspecified: Secondary | ICD-10-CM | POA: Diagnosis not present

## 2019-03-24 DIAGNOSIS — R410 Disorientation, unspecified: Secondary | ICD-10-CM | POA: Diagnosis not present

## 2019-03-24 DIAGNOSIS — I779 Disorder of arteries and arterioles, unspecified: Secondary | ICD-10-CM | POA: Diagnosis not present

## 2019-03-24 DIAGNOSIS — R634 Abnormal weight loss: Secondary | ICD-10-CM | POA: Diagnosis not present

## 2019-03-24 DIAGNOSIS — F0280 Dementia in other diseases classified elsewhere without behavioral disturbance: Secondary | ICD-10-CM | POA: Diagnosis not present

## 2019-03-24 DIAGNOSIS — I10 Essential (primary) hypertension: Secondary | ICD-10-CM | POA: Diagnosis not present

## 2019-03-24 DIAGNOSIS — R451 Restlessness and agitation: Secondary | ICD-10-CM | POA: Diagnosis not present

## 2019-03-24 DIAGNOSIS — G300 Alzheimer's disease with early onset: Secondary | ICD-10-CM | POA: Diagnosis not present

## 2019-03-24 DIAGNOSIS — I6523 Occlusion and stenosis of bilateral carotid arteries: Secondary | ICD-10-CM | POA: Diagnosis not present

## 2019-03-24 DIAGNOSIS — Z66 Do not resuscitate: Secondary | ICD-10-CM | POA: Diagnosis not present

## 2019-03-24 DIAGNOSIS — R296 Repeated falls: Secondary | ICD-10-CM | POA: Diagnosis not present

## 2019-03-24 DIAGNOSIS — Z515 Encounter for palliative care: Secondary | ICD-10-CM | POA: Diagnosis not present

## 2019-03-25 DIAGNOSIS — R410 Disorientation, unspecified: Secondary | ICD-10-CM | POA: Diagnosis not present

## 2019-03-25 DIAGNOSIS — Z66 Do not resuscitate: Secondary | ICD-10-CM | POA: Diagnosis not present

## 2019-03-25 DIAGNOSIS — I6523 Occlusion and stenosis of bilateral carotid arteries: Secondary | ICD-10-CM | POA: Diagnosis not present

## 2019-03-25 DIAGNOSIS — Z515 Encounter for palliative care: Secondary | ICD-10-CM | POA: Diagnosis not present

## 2019-03-25 DIAGNOSIS — I779 Disorder of arteries and arterioles, unspecified: Secondary | ICD-10-CM | POA: Diagnosis not present

## 2019-03-25 DIAGNOSIS — R634 Abnormal weight loss: Secondary | ICD-10-CM | POA: Diagnosis not present

## 2019-03-25 DIAGNOSIS — R451 Restlessness and agitation: Secondary | ICD-10-CM | POA: Diagnosis not present

## 2019-03-25 DIAGNOSIS — R296 Repeated falls: Secondary | ICD-10-CM | POA: Diagnosis not present

## 2019-03-25 DIAGNOSIS — I10 Essential (primary) hypertension: Secondary | ICD-10-CM | POA: Diagnosis not present

## 2019-03-25 DIAGNOSIS — G300 Alzheimer's disease with early onset: Secondary | ICD-10-CM | POA: Diagnosis not present

## 2019-03-25 DIAGNOSIS — F0280 Dementia in other diseases classified elsewhere without behavioral disturbance: Secondary | ICD-10-CM | POA: Diagnosis not present

## 2019-03-26 DIAGNOSIS — Z66 Do not resuscitate: Secondary | ICD-10-CM | POA: Diagnosis not present

## 2019-03-26 DIAGNOSIS — I6523 Occlusion and stenosis of bilateral carotid arteries: Secondary | ICD-10-CM | POA: Diagnosis not present

## 2019-03-26 DIAGNOSIS — I10 Essential (primary) hypertension: Secondary | ICD-10-CM | POA: Diagnosis not present

## 2019-03-26 DIAGNOSIS — R634 Abnormal weight loss: Secondary | ICD-10-CM | POA: Diagnosis not present

## 2019-03-26 DIAGNOSIS — R296 Repeated falls: Secondary | ICD-10-CM | POA: Diagnosis not present

## 2019-03-26 DIAGNOSIS — R410 Disorientation, unspecified: Secondary | ICD-10-CM | POA: Diagnosis not present

## 2019-03-26 DIAGNOSIS — Z515 Encounter for palliative care: Secondary | ICD-10-CM | POA: Diagnosis not present

## 2019-03-26 DIAGNOSIS — F0280 Dementia in other diseases classified elsewhere without behavioral disturbance: Secondary | ICD-10-CM | POA: Diagnosis not present

## 2019-03-26 DIAGNOSIS — R451 Restlessness and agitation: Secondary | ICD-10-CM | POA: Diagnosis not present

## 2019-03-26 DIAGNOSIS — I779 Disorder of arteries and arterioles, unspecified: Secondary | ICD-10-CM | POA: Diagnosis not present

## 2019-03-26 DIAGNOSIS — G300 Alzheimer's disease with early onset: Secondary | ICD-10-CM | POA: Diagnosis not present

## 2019-03-27 DIAGNOSIS — R634 Abnormal weight loss: Secondary | ICD-10-CM | POA: Diagnosis not present

## 2019-03-27 DIAGNOSIS — Z66 Do not resuscitate: Secondary | ICD-10-CM | POA: Diagnosis not present

## 2019-03-27 DIAGNOSIS — F0280 Dementia in other diseases classified elsewhere without behavioral disturbance: Secondary | ICD-10-CM | POA: Diagnosis not present

## 2019-03-27 DIAGNOSIS — R451 Restlessness and agitation: Secondary | ICD-10-CM | POA: Diagnosis not present

## 2019-03-27 DIAGNOSIS — Z515 Encounter for palliative care: Secondary | ICD-10-CM | POA: Diagnosis not present

## 2019-03-27 DIAGNOSIS — I10 Essential (primary) hypertension: Secondary | ICD-10-CM | POA: Diagnosis not present

## 2019-03-27 DIAGNOSIS — R410 Disorientation, unspecified: Secondary | ICD-10-CM | POA: Diagnosis not present

## 2019-03-27 DIAGNOSIS — I6523 Occlusion and stenosis of bilateral carotid arteries: Secondary | ICD-10-CM | POA: Diagnosis not present

## 2019-03-27 DIAGNOSIS — G300 Alzheimer's disease with early onset: Secondary | ICD-10-CM | POA: Diagnosis not present

## 2019-03-27 DIAGNOSIS — R296 Repeated falls: Secondary | ICD-10-CM | POA: Diagnosis not present

## 2019-03-27 DIAGNOSIS — I779 Disorder of arteries and arterioles, unspecified: Secondary | ICD-10-CM | POA: Diagnosis not present

## 2019-03-28 DIAGNOSIS — R296 Repeated falls: Secondary | ICD-10-CM | POA: Diagnosis not present

## 2019-03-28 DIAGNOSIS — I10 Essential (primary) hypertension: Secondary | ICD-10-CM | POA: Diagnosis not present

## 2019-03-28 DIAGNOSIS — R634 Abnormal weight loss: Secondary | ICD-10-CM | POA: Diagnosis not present

## 2019-03-28 DIAGNOSIS — Z66 Do not resuscitate: Secondary | ICD-10-CM | POA: Diagnosis not present

## 2019-03-28 DIAGNOSIS — G300 Alzheimer's disease with early onset: Secondary | ICD-10-CM | POA: Diagnosis not present

## 2019-03-28 DIAGNOSIS — R410 Disorientation, unspecified: Secondary | ICD-10-CM | POA: Diagnosis not present

## 2019-03-28 DIAGNOSIS — R451 Restlessness and agitation: Secondary | ICD-10-CM | POA: Diagnosis not present

## 2019-03-28 DIAGNOSIS — F0280 Dementia in other diseases classified elsewhere without behavioral disturbance: Secondary | ICD-10-CM | POA: Diagnosis not present

## 2019-03-28 DIAGNOSIS — I6523 Occlusion and stenosis of bilateral carotid arteries: Secondary | ICD-10-CM | POA: Diagnosis not present

## 2019-03-28 DIAGNOSIS — I779 Disorder of arteries and arterioles, unspecified: Secondary | ICD-10-CM | POA: Diagnosis not present

## 2019-03-28 DIAGNOSIS — Z515 Encounter for palliative care: Secondary | ICD-10-CM | POA: Diagnosis not present

## 2019-03-29 DIAGNOSIS — G300 Alzheimer's disease with early onset: Secondary | ICD-10-CM | POA: Diagnosis not present

## 2019-03-29 DIAGNOSIS — Z515 Encounter for palliative care: Secondary | ICD-10-CM | POA: Diagnosis not present

## 2019-03-29 DIAGNOSIS — Z66 Do not resuscitate: Secondary | ICD-10-CM | POA: Diagnosis not present

## 2019-03-29 DIAGNOSIS — R634 Abnormal weight loss: Secondary | ICD-10-CM | POA: Diagnosis not present

## 2019-03-29 DIAGNOSIS — R451 Restlessness and agitation: Secondary | ICD-10-CM | POA: Diagnosis not present

## 2019-03-29 DIAGNOSIS — I779 Disorder of arteries and arterioles, unspecified: Secondary | ICD-10-CM | POA: Diagnosis not present

## 2019-03-29 DIAGNOSIS — I6523 Occlusion and stenosis of bilateral carotid arteries: Secondary | ICD-10-CM | POA: Diagnosis not present

## 2019-03-29 DIAGNOSIS — F0280 Dementia in other diseases classified elsewhere without behavioral disturbance: Secondary | ICD-10-CM | POA: Diagnosis not present

## 2019-03-29 DIAGNOSIS — I10 Essential (primary) hypertension: Secondary | ICD-10-CM | POA: Diagnosis not present

## 2019-03-29 DIAGNOSIS — R410 Disorientation, unspecified: Secondary | ICD-10-CM | POA: Diagnosis not present

## 2019-03-29 DIAGNOSIS — R296 Repeated falls: Secondary | ICD-10-CM | POA: Diagnosis not present

## 2019-03-30 DIAGNOSIS — R634 Abnormal weight loss: Secondary | ICD-10-CM | POA: Diagnosis not present

## 2019-03-30 DIAGNOSIS — R296 Repeated falls: Secondary | ICD-10-CM | POA: Diagnosis not present

## 2019-03-30 DIAGNOSIS — I10 Essential (primary) hypertension: Secondary | ICD-10-CM | POA: Diagnosis not present

## 2019-03-30 DIAGNOSIS — I6523 Occlusion and stenosis of bilateral carotid arteries: Secondary | ICD-10-CM | POA: Diagnosis not present

## 2019-03-30 DIAGNOSIS — R451 Restlessness and agitation: Secondary | ICD-10-CM | POA: Diagnosis not present

## 2019-03-30 DIAGNOSIS — I779 Disorder of arteries and arterioles, unspecified: Secondary | ICD-10-CM | POA: Diagnosis not present

## 2019-03-30 DIAGNOSIS — Z66 Do not resuscitate: Secondary | ICD-10-CM | POA: Diagnosis not present

## 2019-03-30 DIAGNOSIS — G300 Alzheimer's disease with early onset: Secondary | ICD-10-CM | POA: Diagnosis not present

## 2019-03-30 DIAGNOSIS — F0280 Dementia in other diseases classified elsewhere without behavioral disturbance: Secondary | ICD-10-CM | POA: Diagnosis not present

## 2019-03-30 DIAGNOSIS — R410 Disorientation, unspecified: Secondary | ICD-10-CM | POA: Diagnosis not present

## 2019-03-30 DIAGNOSIS — Z515 Encounter for palliative care: Secondary | ICD-10-CM | POA: Diagnosis not present

## 2019-03-31 DIAGNOSIS — R296 Repeated falls: Secondary | ICD-10-CM | POA: Diagnosis not present

## 2019-03-31 DIAGNOSIS — Z515 Encounter for palliative care: Secondary | ICD-10-CM | POA: Diagnosis not present

## 2019-03-31 DIAGNOSIS — R451 Restlessness and agitation: Secondary | ICD-10-CM | POA: Diagnosis not present

## 2019-03-31 DIAGNOSIS — I10 Essential (primary) hypertension: Secondary | ICD-10-CM | POA: Diagnosis not present

## 2019-03-31 DIAGNOSIS — F0280 Dementia in other diseases classified elsewhere without behavioral disturbance: Secondary | ICD-10-CM | POA: Diagnosis not present

## 2019-03-31 DIAGNOSIS — G300 Alzheimer's disease with early onset: Secondary | ICD-10-CM | POA: Diagnosis not present

## 2019-03-31 DIAGNOSIS — I779 Disorder of arteries and arterioles, unspecified: Secondary | ICD-10-CM | POA: Diagnosis not present

## 2019-03-31 DIAGNOSIS — R634 Abnormal weight loss: Secondary | ICD-10-CM | POA: Diagnosis not present

## 2019-03-31 DIAGNOSIS — Z66 Do not resuscitate: Secondary | ICD-10-CM | POA: Diagnosis not present

## 2019-03-31 DIAGNOSIS — I6523 Occlusion and stenosis of bilateral carotid arteries: Secondary | ICD-10-CM | POA: Diagnosis not present

## 2019-03-31 DIAGNOSIS — R410 Disorientation, unspecified: Secondary | ICD-10-CM | POA: Diagnosis not present

## 2019-04-01 DIAGNOSIS — R634 Abnormal weight loss: Secondary | ICD-10-CM | POA: Diagnosis not present

## 2019-04-01 DIAGNOSIS — I6523 Occlusion and stenosis of bilateral carotid arteries: Secondary | ICD-10-CM | POA: Diagnosis not present

## 2019-04-01 DIAGNOSIS — R410 Disorientation, unspecified: Secondary | ICD-10-CM | POA: Diagnosis not present

## 2019-04-01 DIAGNOSIS — I779 Disorder of arteries and arterioles, unspecified: Secondary | ICD-10-CM | POA: Diagnosis not present

## 2019-04-01 DIAGNOSIS — F0280 Dementia in other diseases classified elsewhere without behavioral disturbance: Secondary | ICD-10-CM | POA: Diagnosis not present

## 2019-04-01 DIAGNOSIS — R296 Repeated falls: Secondary | ICD-10-CM | POA: Diagnosis not present

## 2019-04-01 DIAGNOSIS — R451 Restlessness and agitation: Secondary | ICD-10-CM | POA: Diagnosis not present

## 2019-04-01 DIAGNOSIS — Z66 Do not resuscitate: Secondary | ICD-10-CM | POA: Diagnosis not present

## 2019-04-01 DIAGNOSIS — I10 Essential (primary) hypertension: Secondary | ICD-10-CM | POA: Diagnosis not present

## 2019-04-01 DIAGNOSIS — G300 Alzheimer's disease with early onset: Secondary | ICD-10-CM | POA: Diagnosis not present

## 2019-04-01 DIAGNOSIS — Z515 Encounter for palliative care: Secondary | ICD-10-CM | POA: Diagnosis not present

## 2019-04-02 DIAGNOSIS — I6523 Occlusion and stenosis of bilateral carotid arteries: Secondary | ICD-10-CM | POA: Diagnosis not present

## 2019-04-02 DIAGNOSIS — F0280 Dementia in other diseases classified elsewhere without behavioral disturbance: Secondary | ICD-10-CM | POA: Diagnosis not present

## 2019-04-02 DIAGNOSIS — R634 Abnormal weight loss: Secondary | ICD-10-CM | POA: Diagnosis not present

## 2019-04-02 DIAGNOSIS — R296 Repeated falls: Secondary | ICD-10-CM | POA: Diagnosis not present

## 2019-04-02 DIAGNOSIS — I10 Essential (primary) hypertension: Secondary | ICD-10-CM | POA: Diagnosis not present

## 2019-04-02 DIAGNOSIS — Z66 Do not resuscitate: Secondary | ICD-10-CM | POA: Diagnosis not present

## 2019-04-02 DIAGNOSIS — Z515 Encounter for palliative care: Secondary | ICD-10-CM | POA: Diagnosis not present

## 2019-04-02 DIAGNOSIS — R410 Disorientation, unspecified: Secondary | ICD-10-CM | POA: Diagnosis not present

## 2019-04-02 DIAGNOSIS — R451 Restlessness and agitation: Secondary | ICD-10-CM | POA: Diagnosis not present

## 2019-04-02 DIAGNOSIS — I779 Disorder of arteries and arterioles, unspecified: Secondary | ICD-10-CM | POA: Diagnosis not present

## 2019-04-02 DIAGNOSIS — G300 Alzheimer's disease with early onset: Secondary | ICD-10-CM | POA: Diagnosis not present

## 2019-04-03 DIAGNOSIS — G300 Alzheimer's disease with early onset: Secondary | ICD-10-CM | POA: Diagnosis not present

## 2019-04-03 DIAGNOSIS — Z515 Encounter for palliative care: Secondary | ICD-10-CM | POA: Diagnosis not present

## 2019-04-03 DIAGNOSIS — R410 Disorientation, unspecified: Secondary | ICD-10-CM | POA: Diagnosis not present

## 2019-04-03 DIAGNOSIS — R296 Repeated falls: Secondary | ICD-10-CM | POA: Diagnosis not present

## 2019-04-03 DIAGNOSIS — R451 Restlessness and agitation: Secondary | ICD-10-CM | POA: Diagnosis not present

## 2019-04-03 DIAGNOSIS — I6523 Occlusion and stenosis of bilateral carotid arteries: Secondary | ICD-10-CM | POA: Diagnosis not present

## 2019-04-03 DIAGNOSIS — Z66 Do not resuscitate: Secondary | ICD-10-CM | POA: Diagnosis not present

## 2019-04-03 DIAGNOSIS — F0280 Dementia in other diseases classified elsewhere without behavioral disturbance: Secondary | ICD-10-CM | POA: Diagnosis not present

## 2019-04-03 DIAGNOSIS — I10 Essential (primary) hypertension: Secondary | ICD-10-CM | POA: Diagnosis not present

## 2019-04-03 DIAGNOSIS — R634 Abnormal weight loss: Secondary | ICD-10-CM | POA: Diagnosis not present

## 2019-04-03 DIAGNOSIS — I779 Disorder of arteries and arterioles, unspecified: Secondary | ICD-10-CM | POA: Diagnosis not present

## 2019-04-04 DIAGNOSIS — R451 Restlessness and agitation: Secondary | ICD-10-CM | POA: Diagnosis not present

## 2019-04-04 DIAGNOSIS — F0280 Dementia in other diseases classified elsewhere without behavioral disturbance: Secondary | ICD-10-CM | POA: Diagnosis not present

## 2019-04-04 DIAGNOSIS — G300 Alzheimer's disease with early onset: Secondary | ICD-10-CM | POA: Diagnosis not present

## 2019-04-04 DIAGNOSIS — Z515 Encounter for palliative care: Secondary | ICD-10-CM | POA: Diagnosis not present

## 2019-04-04 DIAGNOSIS — I10 Essential (primary) hypertension: Secondary | ICD-10-CM | POA: Diagnosis not present

## 2019-04-04 DIAGNOSIS — R634 Abnormal weight loss: Secondary | ICD-10-CM | POA: Diagnosis not present

## 2019-04-04 DIAGNOSIS — Z66 Do not resuscitate: Secondary | ICD-10-CM | POA: Diagnosis not present

## 2019-04-04 DIAGNOSIS — R296 Repeated falls: Secondary | ICD-10-CM | POA: Diagnosis not present

## 2019-04-04 DIAGNOSIS — I779 Disorder of arteries and arterioles, unspecified: Secondary | ICD-10-CM | POA: Diagnosis not present

## 2019-04-04 DIAGNOSIS — I6523 Occlusion and stenosis of bilateral carotid arteries: Secondary | ICD-10-CM | POA: Diagnosis not present

## 2019-04-04 DIAGNOSIS — R410 Disorientation, unspecified: Secondary | ICD-10-CM | POA: Diagnosis not present

## 2019-04-05 DIAGNOSIS — Z66 Do not resuscitate: Secondary | ICD-10-CM | POA: Diagnosis not present

## 2019-04-05 DIAGNOSIS — R410 Disorientation, unspecified: Secondary | ICD-10-CM | POA: Diagnosis not present

## 2019-04-05 DIAGNOSIS — R296 Repeated falls: Secondary | ICD-10-CM | POA: Diagnosis not present

## 2019-04-05 DIAGNOSIS — F0280 Dementia in other diseases classified elsewhere without behavioral disturbance: Secondary | ICD-10-CM | POA: Diagnosis not present

## 2019-04-05 DIAGNOSIS — I779 Disorder of arteries and arterioles, unspecified: Secondary | ICD-10-CM | POA: Diagnosis not present

## 2019-04-05 DIAGNOSIS — I10 Essential (primary) hypertension: Secondary | ICD-10-CM | POA: Diagnosis not present

## 2019-04-05 DIAGNOSIS — R634 Abnormal weight loss: Secondary | ICD-10-CM | POA: Diagnosis not present

## 2019-04-05 DIAGNOSIS — R451 Restlessness and agitation: Secondary | ICD-10-CM | POA: Diagnosis not present

## 2019-04-05 DIAGNOSIS — Z515 Encounter for palliative care: Secondary | ICD-10-CM | POA: Diagnosis not present

## 2019-04-05 DIAGNOSIS — G300 Alzheimer's disease with early onset: Secondary | ICD-10-CM | POA: Diagnosis not present

## 2019-04-05 DIAGNOSIS — I6523 Occlusion and stenosis of bilateral carotid arteries: Secondary | ICD-10-CM | POA: Diagnosis not present

## 2019-04-06 DIAGNOSIS — R634 Abnormal weight loss: Secondary | ICD-10-CM | POA: Diagnosis not present

## 2019-04-06 DIAGNOSIS — R410 Disorientation, unspecified: Secondary | ICD-10-CM | POA: Diagnosis not present

## 2019-04-06 DIAGNOSIS — I779 Disorder of arteries and arterioles, unspecified: Secondary | ICD-10-CM | POA: Diagnosis not present

## 2019-04-06 DIAGNOSIS — I6523 Occlusion and stenosis of bilateral carotid arteries: Secondary | ICD-10-CM | POA: Diagnosis not present

## 2019-04-06 DIAGNOSIS — R296 Repeated falls: Secondary | ICD-10-CM | POA: Diagnosis not present

## 2019-04-06 DIAGNOSIS — Z66 Do not resuscitate: Secondary | ICD-10-CM | POA: Diagnosis not present

## 2019-04-06 DIAGNOSIS — F0280 Dementia in other diseases classified elsewhere without behavioral disturbance: Secondary | ICD-10-CM | POA: Diagnosis not present

## 2019-04-06 DIAGNOSIS — I10 Essential (primary) hypertension: Secondary | ICD-10-CM | POA: Diagnosis not present

## 2019-04-06 DIAGNOSIS — G300 Alzheimer's disease with early onset: Secondary | ICD-10-CM | POA: Diagnosis not present

## 2019-04-06 DIAGNOSIS — Z515 Encounter for palliative care: Secondary | ICD-10-CM | POA: Diagnosis not present

## 2019-04-06 DIAGNOSIS — R451 Restlessness and agitation: Secondary | ICD-10-CM | POA: Diagnosis not present

## 2019-04-07 DIAGNOSIS — I779 Disorder of arteries and arterioles, unspecified: Secondary | ICD-10-CM | POA: Diagnosis not present

## 2019-04-07 DIAGNOSIS — I10 Essential (primary) hypertension: Secondary | ICD-10-CM | POA: Diagnosis not present

## 2019-04-07 DIAGNOSIS — R634 Abnormal weight loss: Secondary | ICD-10-CM | POA: Diagnosis not present

## 2019-04-07 DIAGNOSIS — G300 Alzheimer's disease with early onset: Secondary | ICD-10-CM | POA: Diagnosis not present

## 2019-04-07 DIAGNOSIS — I6523 Occlusion and stenosis of bilateral carotid arteries: Secondary | ICD-10-CM | POA: Diagnosis not present

## 2019-04-07 DIAGNOSIS — R410 Disorientation, unspecified: Secondary | ICD-10-CM | POA: Diagnosis not present

## 2019-04-07 DIAGNOSIS — R451 Restlessness and agitation: Secondary | ICD-10-CM | POA: Diagnosis not present

## 2019-04-07 DIAGNOSIS — R296 Repeated falls: Secondary | ICD-10-CM | POA: Diagnosis not present

## 2019-04-07 DIAGNOSIS — Z66 Do not resuscitate: Secondary | ICD-10-CM | POA: Diagnosis not present

## 2019-04-07 DIAGNOSIS — F0280 Dementia in other diseases classified elsewhere without behavioral disturbance: Secondary | ICD-10-CM | POA: Diagnosis not present

## 2019-04-07 DIAGNOSIS — Z515 Encounter for palliative care: Secondary | ICD-10-CM | POA: Diagnosis not present

## 2019-04-08 DIAGNOSIS — Z515 Encounter for palliative care: Secondary | ICD-10-CM | POA: Diagnosis not present

## 2019-04-08 DIAGNOSIS — R451 Restlessness and agitation: Secondary | ICD-10-CM | POA: Diagnosis not present

## 2019-04-08 DIAGNOSIS — I779 Disorder of arteries and arterioles, unspecified: Secondary | ICD-10-CM | POA: Diagnosis not present

## 2019-04-08 DIAGNOSIS — R296 Repeated falls: Secondary | ICD-10-CM | POA: Diagnosis not present

## 2019-04-08 DIAGNOSIS — F0280 Dementia in other diseases classified elsewhere without behavioral disturbance: Secondary | ICD-10-CM | POA: Diagnosis not present

## 2019-04-08 DIAGNOSIS — R410 Disorientation, unspecified: Secondary | ICD-10-CM | POA: Diagnosis not present

## 2019-04-08 DIAGNOSIS — I10 Essential (primary) hypertension: Secondary | ICD-10-CM | POA: Diagnosis not present

## 2019-04-08 DIAGNOSIS — I6523 Occlusion and stenosis of bilateral carotid arteries: Secondary | ICD-10-CM | POA: Diagnosis not present

## 2019-04-08 DIAGNOSIS — Z66 Do not resuscitate: Secondary | ICD-10-CM | POA: Diagnosis not present

## 2019-04-08 DIAGNOSIS — G300 Alzheimer's disease with early onset: Secondary | ICD-10-CM | POA: Diagnosis not present

## 2019-04-08 DIAGNOSIS — R634 Abnormal weight loss: Secondary | ICD-10-CM | POA: Diagnosis not present

## 2019-04-09 DIAGNOSIS — R451 Restlessness and agitation: Secondary | ICD-10-CM | POA: Diagnosis not present

## 2019-04-09 DIAGNOSIS — R296 Repeated falls: Secondary | ICD-10-CM | POA: Diagnosis not present

## 2019-04-09 DIAGNOSIS — Z515 Encounter for palliative care: Secondary | ICD-10-CM | POA: Diagnosis not present

## 2019-04-09 DIAGNOSIS — I10 Essential (primary) hypertension: Secondary | ICD-10-CM | POA: Diagnosis not present

## 2019-04-09 DIAGNOSIS — I779 Disorder of arteries and arterioles, unspecified: Secondary | ICD-10-CM | POA: Diagnosis not present

## 2019-04-09 DIAGNOSIS — F0280 Dementia in other diseases classified elsewhere without behavioral disturbance: Secondary | ICD-10-CM | POA: Diagnosis not present

## 2019-04-09 DIAGNOSIS — R634 Abnormal weight loss: Secondary | ICD-10-CM | POA: Diagnosis not present

## 2019-04-09 DIAGNOSIS — Z66 Do not resuscitate: Secondary | ICD-10-CM | POA: Diagnosis not present

## 2019-04-09 DIAGNOSIS — R410 Disorientation, unspecified: Secondary | ICD-10-CM | POA: Diagnosis not present

## 2019-04-09 DIAGNOSIS — I6523 Occlusion and stenosis of bilateral carotid arteries: Secondary | ICD-10-CM | POA: Diagnosis not present

## 2019-04-09 DIAGNOSIS — G300 Alzheimer's disease with early onset: Secondary | ICD-10-CM | POA: Diagnosis not present

## 2019-04-10 DIAGNOSIS — G300 Alzheimer's disease with early onset: Secondary | ICD-10-CM | POA: Diagnosis not present

## 2019-04-10 DIAGNOSIS — R296 Repeated falls: Secondary | ICD-10-CM | POA: Diagnosis not present

## 2019-04-10 DIAGNOSIS — I779 Disorder of arteries and arterioles, unspecified: Secondary | ICD-10-CM | POA: Diagnosis not present

## 2019-04-10 DIAGNOSIS — R634 Abnormal weight loss: Secondary | ICD-10-CM | POA: Diagnosis not present

## 2019-04-10 DIAGNOSIS — R410 Disorientation, unspecified: Secondary | ICD-10-CM | POA: Diagnosis not present

## 2019-04-10 DIAGNOSIS — Z515 Encounter for palliative care: Secondary | ICD-10-CM | POA: Diagnosis not present

## 2019-04-10 DIAGNOSIS — I10 Essential (primary) hypertension: Secondary | ICD-10-CM | POA: Diagnosis not present

## 2019-04-10 DIAGNOSIS — F0280 Dementia in other diseases classified elsewhere without behavioral disturbance: Secondary | ICD-10-CM | POA: Diagnosis not present

## 2019-04-10 DIAGNOSIS — I6523 Occlusion and stenosis of bilateral carotid arteries: Secondary | ICD-10-CM | POA: Diagnosis not present

## 2019-04-10 DIAGNOSIS — Z66 Do not resuscitate: Secondary | ICD-10-CM | POA: Diagnosis not present

## 2019-04-10 DIAGNOSIS — R451 Restlessness and agitation: Secondary | ICD-10-CM | POA: Diagnosis not present

## 2019-04-11 DIAGNOSIS — F0280 Dementia in other diseases classified elsewhere without behavioral disturbance: Secondary | ICD-10-CM | POA: Diagnosis not present

## 2019-04-11 DIAGNOSIS — R451 Restlessness and agitation: Secondary | ICD-10-CM | POA: Diagnosis not present

## 2019-04-11 DIAGNOSIS — G300 Alzheimer's disease with early onset: Secondary | ICD-10-CM | POA: Diagnosis not present

## 2019-04-11 DIAGNOSIS — R410 Disorientation, unspecified: Secondary | ICD-10-CM | POA: Diagnosis not present

## 2019-04-11 DIAGNOSIS — Z66 Do not resuscitate: Secondary | ICD-10-CM | POA: Diagnosis not present

## 2019-04-11 DIAGNOSIS — R634 Abnormal weight loss: Secondary | ICD-10-CM | POA: Diagnosis not present

## 2019-04-11 DIAGNOSIS — I10 Essential (primary) hypertension: Secondary | ICD-10-CM | POA: Diagnosis not present

## 2019-04-11 DIAGNOSIS — I6523 Occlusion and stenosis of bilateral carotid arteries: Secondary | ICD-10-CM | POA: Diagnosis not present

## 2019-04-11 DIAGNOSIS — I779 Disorder of arteries and arterioles, unspecified: Secondary | ICD-10-CM | POA: Diagnosis not present

## 2019-04-11 DIAGNOSIS — Z515 Encounter for palliative care: Secondary | ICD-10-CM | POA: Diagnosis not present

## 2019-04-11 DIAGNOSIS — R296 Repeated falls: Secondary | ICD-10-CM | POA: Diagnosis not present

## 2019-04-12 DIAGNOSIS — F0280 Dementia in other diseases classified elsewhere without behavioral disturbance: Secondary | ICD-10-CM | POA: Diagnosis not present

## 2019-04-12 DIAGNOSIS — Z515 Encounter for palliative care: Secondary | ICD-10-CM | POA: Diagnosis not present

## 2019-04-12 DIAGNOSIS — G300 Alzheimer's disease with early onset: Secondary | ICD-10-CM | POA: Diagnosis not present

## 2019-04-12 DIAGNOSIS — R451 Restlessness and agitation: Secondary | ICD-10-CM | POA: Diagnosis not present

## 2019-04-12 DIAGNOSIS — R410 Disorientation, unspecified: Secondary | ICD-10-CM | POA: Diagnosis not present

## 2019-04-12 DIAGNOSIS — I779 Disorder of arteries and arterioles, unspecified: Secondary | ICD-10-CM | POA: Diagnosis not present

## 2019-04-12 DIAGNOSIS — R296 Repeated falls: Secondary | ICD-10-CM | POA: Diagnosis not present

## 2019-04-12 DIAGNOSIS — R634 Abnormal weight loss: Secondary | ICD-10-CM | POA: Diagnosis not present

## 2019-04-12 DIAGNOSIS — I6523 Occlusion and stenosis of bilateral carotid arteries: Secondary | ICD-10-CM | POA: Diagnosis not present

## 2019-04-12 DIAGNOSIS — Z66 Do not resuscitate: Secondary | ICD-10-CM | POA: Diagnosis not present

## 2019-04-12 DIAGNOSIS — I10 Essential (primary) hypertension: Secondary | ICD-10-CM | POA: Diagnosis not present

## 2019-04-13 DIAGNOSIS — G300 Alzheimer's disease with early onset: Secondary | ICD-10-CM | POA: Diagnosis not present

## 2019-04-13 DIAGNOSIS — F0280 Dementia in other diseases classified elsewhere without behavioral disturbance: Secondary | ICD-10-CM | POA: Diagnosis not present

## 2019-04-13 DIAGNOSIS — R634 Abnormal weight loss: Secondary | ICD-10-CM | POA: Diagnosis not present

## 2019-04-13 DIAGNOSIS — Z66 Do not resuscitate: Secondary | ICD-10-CM | POA: Diagnosis not present

## 2019-04-13 DIAGNOSIS — I10 Essential (primary) hypertension: Secondary | ICD-10-CM | POA: Diagnosis not present

## 2019-04-13 DIAGNOSIS — R410 Disorientation, unspecified: Secondary | ICD-10-CM | POA: Diagnosis not present

## 2019-04-13 DIAGNOSIS — I6523 Occlusion and stenosis of bilateral carotid arteries: Secondary | ICD-10-CM | POA: Diagnosis not present

## 2019-04-13 DIAGNOSIS — Z515 Encounter for palliative care: Secondary | ICD-10-CM | POA: Diagnosis not present

## 2019-04-13 DIAGNOSIS — I779 Disorder of arteries and arterioles, unspecified: Secondary | ICD-10-CM | POA: Diagnosis not present

## 2019-04-13 DIAGNOSIS — R296 Repeated falls: Secondary | ICD-10-CM | POA: Diagnosis not present

## 2019-04-13 DIAGNOSIS — R451 Restlessness and agitation: Secondary | ICD-10-CM | POA: Diagnosis not present

## 2019-04-14 DIAGNOSIS — I779 Disorder of arteries and arterioles, unspecified: Secondary | ICD-10-CM | POA: Diagnosis not present

## 2019-04-14 DIAGNOSIS — R410 Disorientation, unspecified: Secondary | ICD-10-CM | POA: Diagnosis not present

## 2019-04-14 DIAGNOSIS — R296 Repeated falls: Secondary | ICD-10-CM | POA: Diagnosis not present

## 2019-04-14 DIAGNOSIS — R451 Restlessness and agitation: Secondary | ICD-10-CM | POA: Diagnosis not present

## 2019-04-14 DIAGNOSIS — G300 Alzheimer's disease with early onset: Secondary | ICD-10-CM | POA: Diagnosis not present

## 2019-04-14 DIAGNOSIS — F0280 Dementia in other diseases classified elsewhere without behavioral disturbance: Secondary | ICD-10-CM | POA: Diagnosis not present

## 2019-04-14 DIAGNOSIS — Z66 Do not resuscitate: Secondary | ICD-10-CM | POA: Diagnosis not present

## 2019-04-14 DIAGNOSIS — R634 Abnormal weight loss: Secondary | ICD-10-CM | POA: Diagnosis not present

## 2019-04-14 DIAGNOSIS — I10 Essential (primary) hypertension: Secondary | ICD-10-CM | POA: Diagnosis not present

## 2019-04-14 DIAGNOSIS — I6523 Occlusion and stenosis of bilateral carotid arteries: Secondary | ICD-10-CM | POA: Diagnosis not present

## 2019-04-14 DIAGNOSIS — Z515 Encounter for palliative care: Secondary | ICD-10-CM | POA: Diagnosis not present

## 2019-04-15 DIAGNOSIS — I779 Disorder of arteries and arterioles, unspecified: Secondary | ICD-10-CM | POA: Diagnosis not present

## 2019-04-15 DIAGNOSIS — R296 Repeated falls: Secondary | ICD-10-CM | POA: Diagnosis not present

## 2019-04-15 DIAGNOSIS — I6523 Occlusion and stenosis of bilateral carotid arteries: Secondary | ICD-10-CM | POA: Diagnosis not present

## 2019-04-15 DIAGNOSIS — I10 Essential (primary) hypertension: Secondary | ICD-10-CM | POA: Diagnosis not present

## 2019-04-15 DIAGNOSIS — R410 Disorientation, unspecified: Secondary | ICD-10-CM | POA: Diagnosis not present

## 2019-04-15 DIAGNOSIS — R634 Abnormal weight loss: Secondary | ICD-10-CM | POA: Diagnosis not present

## 2019-04-15 DIAGNOSIS — G300 Alzheimer's disease with early onset: Secondary | ICD-10-CM | POA: Diagnosis not present

## 2019-04-15 DIAGNOSIS — Z66 Do not resuscitate: Secondary | ICD-10-CM | POA: Diagnosis not present

## 2019-04-15 DIAGNOSIS — R451 Restlessness and agitation: Secondary | ICD-10-CM | POA: Diagnosis not present

## 2019-04-15 DIAGNOSIS — F0280 Dementia in other diseases classified elsewhere without behavioral disturbance: Secondary | ICD-10-CM | POA: Diagnosis not present

## 2019-04-15 DIAGNOSIS — Z515 Encounter for palliative care: Secondary | ICD-10-CM | POA: Diagnosis not present

## 2019-04-16 DIAGNOSIS — R296 Repeated falls: Secondary | ICD-10-CM | POA: Diagnosis not present

## 2019-04-16 DIAGNOSIS — R634 Abnormal weight loss: Secondary | ICD-10-CM | POA: Diagnosis not present

## 2019-04-16 DIAGNOSIS — F0280 Dementia in other diseases classified elsewhere without behavioral disturbance: Secondary | ICD-10-CM | POA: Diagnosis not present

## 2019-04-16 DIAGNOSIS — R410 Disorientation, unspecified: Secondary | ICD-10-CM | POA: Diagnosis not present

## 2019-04-16 DIAGNOSIS — G300 Alzheimer's disease with early onset: Secondary | ICD-10-CM | POA: Diagnosis not present

## 2019-04-16 DIAGNOSIS — Z66 Do not resuscitate: Secondary | ICD-10-CM | POA: Diagnosis not present

## 2019-04-16 DIAGNOSIS — R451 Restlessness and agitation: Secondary | ICD-10-CM | POA: Diagnosis not present

## 2019-04-16 DIAGNOSIS — I779 Disorder of arteries and arterioles, unspecified: Secondary | ICD-10-CM | POA: Diagnosis not present

## 2019-04-16 DIAGNOSIS — I10 Essential (primary) hypertension: Secondary | ICD-10-CM | POA: Diagnosis not present

## 2019-04-16 DIAGNOSIS — I6523 Occlusion and stenosis of bilateral carotid arteries: Secondary | ICD-10-CM | POA: Diagnosis not present

## 2019-04-16 DIAGNOSIS — Z515 Encounter for palliative care: Secondary | ICD-10-CM | POA: Diagnosis not present

## 2019-04-17 DIAGNOSIS — R54 Age-related physical debility: Secondary | ICD-10-CM | POA: Diagnosis not present

## 2019-04-17 DIAGNOSIS — I6523 Occlusion and stenosis of bilateral carotid arteries: Secondary | ICD-10-CM | POA: Diagnosis not present

## 2019-04-17 DIAGNOSIS — R296 Repeated falls: Secondary | ICD-10-CM | POA: Diagnosis not present

## 2019-04-17 DIAGNOSIS — R634 Abnormal weight loss: Secondary | ICD-10-CM | POA: Diagnosis not present

## 2019-04-17 DIAGNOSIS — I10 Essential (primary) hypertension: Secondary | ICD-10-CM | POA: Diagnosis not present

## 2019-04-17 DIAGNOSIS — R32 Unspecified urinary incontinence: Secondary | ICD-10-CM | POA: Diagnosis not present

## 2019-04-17 DIAGNOSIS — F0391 Unspecified dementia with behavioral disturbance: Secondary | ICD-10-CM | POA: Diagnosis not present

## 2019-04-17 DIAGNOSIS — F0280 Dementia in other diseases classified elsewhere without behavioral disturbance: Secondary | ICD-10-CM | POA: Diagnosis not present

## 2019-04-17 DIAGNOSIS — Z66 Do not resuscitate: Secondary | ICD-10-CM | POA: Diagnosis not present

## 2019-04-17 DIAGNOSIS — I779 Disorder of arteries and arterioles, unspecified: Secondary | ICD-10-CM | POA: Diagnosis not present

## 2019-04-17 DIAGNOSIS — F419 Anxiety disorder, unspecified: Secondary | ICD-10-CM | POA: Diagnosis not present

## 2019-04-17 DIAGNOSIS — Z515 Encounter for palliative care: Secondary | ICD-10-CM | POA: Diagnosis not present

## 2019-04-17 DIAGNOSIS — R410 Disorientation, unspecified: Secondary | ICD-10-CM | POA: Diagnosis not present

## 2019-04-17 DIAGNOSIS — G300 Alzheimer's disease with early onset: Secondary | ICD-10-CM | POA: Diagnosis not present

## 2019-04-17 DIAGNOSIS — R451 Restlessness and agitation: Secondary | ICD-10-CM | POA: Diagnosis not present

## 2019-04-18 DIAGNOSIS — I6523 Occlusion and stenosis of bilateral carotid arteries: Secondary | ICD-10-CM | POA: Diagnosis not present

## 2019-04-18 DIAGNOSIS — R410 Disorientation, unspecified: Secondary | ICD-10-CM | POA: Diagnosis not present

## 2019-04-18 DIAGNOSIS — Z66 Do not resuscitate: Secondary | ICD-10-CM | POA: Diagnosis not present

## 2019-04-18 DIAGNOSIS — I779 Disorder of arteries and arterioles, unspecified: Secondary | ICD-10-CM | POA: Diagnosis not present

## 2019-04-18 DIAGNOSIS — Z515 Encounter for palliative care: Secondary | ICD-10-CM | POA: Diagnosis not present

## 2019-04-18 DIAGNOSIS — I10 Essential (primary) hypertension: Secondary | ICD-10-CM | POA: Diagnosis not present

## 2019-04-18 DIAGNOSIS — F0280 Dementia in other diseases classified elsewhere without behavioral disturbance: Secondary | ICD-10-CM | POA: Diagnosis not present

## 2019-04-18 DIAGNOSIS — R451 Restlessness and agitation: Secondary | ICD-10-CM | POA: Diagnosis not present

## 2019-04-18 DIAGNOSIS — R296 Repeated falls: Secondary | ICD-10-CM | POA: Diagnosis not present

## 2019-04-18 DIAGNOSIS — R634 Abnormal weight loss: Secondary | ICD-10-CM | POA: Diagnosis not present

## 2019-04-18 DIAGNOSIS — G300 Alzheimer's disease with early onset: Secondary | ICD-10-CM | POA: Diagnosis not present

## 2019-04-19 DIAGNOSIS — I779 Disorder of arteries and arterioles, unspecified: Secondary | ICD-10-CM | POA: Diagnosis not present

## 2019-04-19 DIAGNOSIS — F0280 Dementia in other diseases classified elsewhere without behavioral disturbance: Secondary | ICD-10-CM | POA: Diagnosis not present

## 2019-04-19 DIAGNOSIS — I10 Essential (primary) hypertension: Secondary | ICD-10-CM | POA: Diagnosis not present

## 2019-04-19 DIAGNOSIS — G300 Alzheimer's disease with early onset: Secondary | ICD-10-CM | POA: Diagnosis not present

## 2019-04-19 DIAGNOSIS — R296 Repeated falls: Secondary | ICD-10-CM | POA: Diagnosis not present

## 2019-04-19 DIAGNOSIS — Z66 Do not resuscitate: Secondary | ICD-10-CM | POA: Diagnosis not present

## 2019-04-19 DIAGNOSIS — R410 Disorientation, unspecified: Secondary | ICD-10-CM | POA: Diagnosis not present

## 2019-04-19 DIAGNOSIS — R634 Abnormal weight loss: Secondary | ICD-10-CM | POA: Diagnosis not present

## 2019-04-19 DIAGNOSIS — Z515 Encounter for palliative care: Secondary | ICD-10-CM | POA: Diagnosis not present

## 2019-04-19 DIAGNOSIS — R451 Restlessness and agitation: Secondary | ICD-10-CM | POA: Diagnosis not present

## 2019-04-19 DIAGNOSIS — I6523 Occlusion and stenosis of bilateral carotid arteries: Secondary | ICD-10-CM | POA: Diagnosis not present

## 2019-04-20 DIAGNOSIS — R634 Abnormal weight loss: Secondary | ICD-10-CM | POA: Diagnosis not present

## 2019-04-20 DIAGNOSIS — G300 Alzheimer's disease with early onset: Secondary | ICD-10-CM | POA: Diagnosis not present

## 2019-04-20 DIAGNOSIS — Z515 Encounter for palliative care: Secondary | ICD-10-CM | POA: Diagnosis not present

## 2019-04-20 DIAGNOSIS — I10 Essential (primary) hypertension: Secondary | ICD-10-CM | POA: Diagnosis not present

## 2019-04-20 DIAGNOSIS — Z66 Do not resuscitate: Secondary | ICD-10-CM | POA: Diagnosis not present

## 2019-04-20 DIAGNOSIS — R296 Repeated falls: Secondary | ICD-10-CM | POA: Diagnosis not present

## 2019-04-20 DIAGNOSIS — I779 Disorder of arteries and arterioles, unspecified: Secondary | ICD-10-CM | POA: Diagnosis not present

## 2019-04-20 DIAGNOSIS — I6523 Occlusion and stenosis of bilateral carotid arteries: Secondary | ICD-10-CM | POA: Diagnosis not present

## 2019-04-20 DIAGNOSIS — F0280 Dementia in other diseases classified elsewhere without behavioral disturbance: Secondary | ICD-10-CM | POA: Diagnosis not present

## 2019-04-20 DIAGNOSIS — R410 Disorientation, unspecified: Secondary | ICD-10-CM | POA: Diagnosis not present

## 2019-04-20 DIAGNOSIS — R451 Restlessness and agitation: Secondary | ICD-10-CM | POA: Diagnosis not present

## 2019-04-21 DIAGNOSIS — I6523 Occlusion and stenosis of bilateral carotid arteries: Secondary | ICD-10-CM | POA: Diagnosis not present

## 2019-04-21 DIAGNOSIS — R634 Abnormal weight loss: Secondary | ICD-10-CM | POA: Diagnosis not present

## 2019-04-21 DIAGNOSIS — R451 Restlessness and agitation: Secondary | ICD-10-CM | POA: Diagnosis not present

## 2019-04-21 DIAGNOSIS — F0280 Dementia in other diseases classified elsewhere without behavioral disturbance: Secondary | ICD-10-CM | POA: Diagnosis not present

## 2019-04-21 DIAGNOSIS — Z515 Encounter for palliative care: Secondary | ICD-10-CM | POA: Diagnosis not present

## 2019-04-21 DIAGNOSIS — R410 Disorientation, unspecified: Secondary | ICD-10-CM | POA: Diagnosis not present

## 2019-04-21 DIAGNOSIS — I779 Disorder of arteries and arterioles, unspecified: Secondary | ICD-10-CM | POA: Diagnosis not present

## 2019-04-21 DIAGNOSIS — R296 Repeated falls: Secondary | ICD-10-CM | POA: Diagnosis not present

## 2019-04-21 DIAGNOSIS — G300 Alzheimer's disease with early onset: Secondary | ICD-10-CM | POA: Diagnosis not present

## 2019-04-21 DIAGNOSIS — I10 Essential (primary) hypertension: Secondary | ICD-10-CM | POA: Diagnosis not present

## 2019-04-21 DIAGNOSIS — Z66 Do not resuscitate: Secondary | ICD-10-CM | POA: Diagnosis not present

## 2019-04-22 DIAGNOSIS — I6523 Occlusion and stenosis of bilateral carotid arteries: Secondary | ICD-10-CM | POA: Diagnosis not present

## 2019-04-22 DIAGNOSIS — I779 Disorder of arteries and arterioles, unspecified: Secondary | ICD-10-CM | POA: Diagnosis not present

## 2019-04-22 DIAGNOSIS — R410 Disorientation, unspecified: Secondary | ICD-10-CM | POA: Diagnosis not present

## 2019-04-22 DIAGNOSIS — R451 Restlessness and agitation: Secondary | ICD-10-CM | POA: Diagnosis not present

## 2019-04-22 DIAGNOSIS — I10 Essential (primary) hypertension: Secondary | ICD-10-CM | POA: Diagnosis not present

## 2019-04-22 DIAGNOSIS — Z515 Encounter for palliative care: Secondary | ICD-10-CM | POA: Diagnosis not present

## 2019-04-22 DIAGNOSIS — R296 Repeated falls: Secondary | ICD-10-CM | POA: Diagnosis not present

## 2019-04-22 DIAGNOSIS — Z66 Do not resuscitate: Secondary | ICD-10-CM | POA: Diagnosis not present

## 2019-04-22 DIAGNOSIS — R634 Abnormal weight loss: Secondary | ICD-10-CM | POA: Diagnosis not present

## 2019-04-22 DIAGNOSIS — G300 Alzheimer's disease with early onset: Secondary | ICD-10-CM | POA: Diagnosis not present

## 2019-04-22 DIAGNOSIS — F0280 Dementia in other diseases classified elsewhere without behavioral disturbance: Secondary | ICD-10-CM | POA: Diagnosis not present

## 2019-04-23 DIAGNOSIS — I6523 Occlusion and stenosis of bilateral carotid arteries: Secondary | ICD-10-CM | POA: Diagnosis not present

## 2019-04-23 DIAGNOSIS — F0280 Dementia in other diseases classified elsewhere without behavioral disturbance: Secondary | ICD-10-CM | POA: Diagnosis not present

## 2019-04-23 DIAGNOSIS — R296 Repeated falls: Secondary | ICD-10-CM | POA: Diagnosis not present

## 2019-04-23 DIAGNOSIS — R410 Disorientation, unspecified: Secondary | ICD-10-CM | POA: Diagnosis not present

## 2019-04-23 DIAGNOSIS — G300 Alzheimer's disease with early onset: Secondary | ICD-10-CM | POA: Diagnosis not present

## 2019-04-23 DIAGNOSIS — Z66 Do not resuscitate: Secondary | ICD-10-CM | POA: Diagnosis not present

## 2019-04-23 DIAGNOSIS — Z515 Encounter for palliative care: Secondary | ICD-10-CM | POA: Diagnosis not present

## 2019-04-23 DIAGNOSIS — I10 Essential (primary) hypertension: Secondary | ICD-10-CM | POA: Diagnosis not present

## 2019-04-23 DIAGNOSIS — R451 Restlessness and agitation: Secondary | ICD-10-CM | POA: Diagnosis not present

## 2019-04-23 DIAGNOSIS — I779 Disorder of arteries and arterioles, unspecified: Secondary | ICD-10-CM | POA: Diagnosis not present

## 2019-04-23 DIAGNOSIS — R634 Abnormal weight loss: Secondary | ICD-10-CM | POA: Diagnosis not present

## 2019-04-24 DIAGNOSIS — R634 Abnormal weight loss: Secondary | ICD-10-CM | POA: Diagnosis not present

## 2019-04-24 DIAGNOSIS — F0280 Dementia in other diseases classified elsewhere without behavioral disturbance: Secondary | ICD-10-CM | POA: Diagnosis not present

## 2019-04-24 DIAGNOSIS — G300 Alzheimer's disease with early onset: Secondary | ICD-10-CM | POA: Diagnosis not present

## 2019-04-24 DIAGNOSIS — R410 Disorientation, unspecified: Secondary | ICD-10-CM | POA: Diagnosis not present

## 2019-04-24 DIAGNOSIS — I6523 Occlusion and stenosis of bilateral carotid arteries: Secondary | ICD-10-CM | POA: Diagnosis not present

## 2019-04-24 DIAGNOSIS — Z66 Do not resuscitate: Secondary | ICD-10-CM | POA: Diagnosis not present

## 2019-04-24 DIAGNOSIS — I779 Disorder of arteries and arterioles, unspecified: Secondary | ICD-10-CM | POA: Diagnosis not present

## 2019-04-24 DIAGNOSIS — R296 Repeated falls: Secondary | ICD-10-CM | POA: Diagnosis not present

## 2019-04-24 DIAGNOSIS — I10 Essential (primary) hypertension: Secondary | ICD-10-CM | POA: Diagnosis not present

## 2019-04-24 DIAGNOSIS — R451 Restlessness and agitation: Secondary | ICD-10-CM | POA: Diagnosis not present

## 2019-04-24 DIAGNOSIS — Z515 Encounter for palliative care: Secondary | ICD-10-CM | POA: Diagnosis not present

## 2019-04-25 DIAGNOSIS — G300 Alzheimer's disease with early onset: Secondary | ICD-10-CM | POA: Diagnosis not present

## 2019-04-25 DIAGNOSIS — R410 Disorientation, unspecified: Secondary | ICD-10-CM | POA: Diagnosis not present

## 2019-04-25 DIAGNOSIS — I6523 Occlusion and stenosis of bilateral carotid arteries: Secondary | ICD-10-CM | POA: Diagnosis not present

## 2019-04-25 DIAGNOSIS — Z66 Do not resuscitate: Secondary | ICD-10-CM | POA: Diagnosis not present

## 2019-04-25 DIAGNOSIS — I10 Essential (primary) hypertension: Secondary | ICD-10-CM | POA: Diagnosis not present

## 2019-04-25 DIAGNOSIS — R296 Repeated falls: Secondary | ICD-10-CM | POA: Diagnosis not present

## 2019-04-25 DIAGNOSIS — Z515 Encounter for palliative care: Secondary | ICD-10-CM | POA: Diagnosis not present

## 2019-04-25 DIAGNOSIS — R634 Abnormal weight loss: Secondary | ICD-10-CM | POA: Diagnosis not present

## 2019-04-25 DIAGNOSIS — F0280 Dementia in other diseases classified elsewhere without behavioral disturbance: Secondary | ICD-10-CM | POA: Diagnosis not present

## 2019-04-25 DIAGNOSIS — R451 Restlessness and agitation: Secondary | ICD-10-CM | POA: Diagnosis not present

## 2019-04-25 DIAGNOSIS — I779 Disorder of arteries and arterioles, unspecified: Secondary | ICD-10-CM | POA: Diagnosis not present

## 2019-04-26 DIAGNOSIS — I6523 Occlusion and stenosis of bilateral carotid arteries: Secondary | ICD-10-CM | POA: Diagnosis not present

## 2019-04-26 DIAGNOSIS — G300 Alzheimer's disease with early onset: Secondary | ICD-10-CM | POA: Diagnosis not present

## 2019-04-26 DIAGNOSIS — R634 Abnormal weight loss: Secondary | ICD-10-CM | POA: Diagnosis not present

## 2019-04-26 DIAGNOSIS — Z515 Encounter for palliative care: Secondary | ICD-10-CM | POA: Diagnosis not present

## 2019-04-26 DIAGNOSIS — R296 Repeated falls: Secondary | ICD-10-CM | POA: Diagnosis not present

## 2019-04-26 DIAGNOSIS — I779 Disorder of arteries and arterioles, unspecified: Secondary | ICD-10-CM | POA: Diagnosis not present

## 2019-04-26 DIAGNOSIS — R410 Disorientation, unspecified: Secondary | ICD-10-CM | POA: Diagnosis not present

## 2019-04-26 DIAGNOSIS — R451 Restlessness and agitation: Secondary | ICD-10-CM | POA: Diagnosis not present

## 2019-04-26 DIAGNOSIS — I10 Essential (primary) hypertension: Secondary | ICD-10-CM | POA: Diagnosis not present

## 2019-04-26 DIAGNOSIS — F0280 Dementia in other diseases classified elsewhere without behavioral disturbance: Secondary | ICD-10-CM | POA: Diagnosis not present

## 2019-04-26 DIAGNOSIS — Z66 Do not resuscitate: Secondary | ICD-10-CM | POA: Diagnosis not present

## 2019-04-27 DIAGNOSIS — I779 Disorder of arteries and arterioles, unspecified: Secondary | ICD-10-CM | POA: Diagnosis not present

## 2019-04-27 DIAGNOSIS — R634 Abnormal weight loss: Secondary | ICD-10-CM | POA: Diagnosis not present

## 2019-04-27 DIAGNOSIS — I6523 Occlusion and stenosis of bilateral carotid arteries: Secondary | ICD-10-CM | POA: Diagnosis not present

## 2019-04-27 DIAGNOSIS — R296 Repeated falls: Secondary | ICD-10-CM | POA: Diagnosis not present

## 2019-04-27 DIAGNOSIS — R451 Restlessness and agitation: Secondary | ICD-10-CM | POA: Diagnosis not present

## 2019-04-27 DIAGNOSIS — I10 Essential (primary) hypertension: Secondary | ICD-10-CM | POA: Diagnosis not present

## 2019-04-27 DIAGNOSIS — G300 Alzheimer's disease with early onset: Secondary | ICD-10-CM | POA: Diagnosis not present

## 2019-04-27 DIAGNOSIS — Z515 Encounter for palliative care: Secondary | ICD-10-CM | POA: Diagnosis not present

## 2019-04-27 DIAGNOSIS — Z66 Do not resuscitate: Secondary | ICD-10-CM | POA: Diagnosis not present

## 2019-04-27 DIAGNOSIS — F0280 Dementia in other diseases classified elsewhere without behavioral disturbance: Secondary | ICD-10-CM | POA: Diagnosis not present

## 2019-04-27 DIAGNOSIS — R410 Disorientation, unspecified: Secondary | ICD-10-CM | POA: Diagnosis not present

## 2019-04-28 DIAGNOSIS — R451 Restlessness and agitation: Secondary | ICD-10-CM | POA: Diagnosis not present

## 2019-04-28 DIAGNOSIS — G300 Alzheimer's disease with early onset: Secondary | ICD-10-CM | POA: Diagnosis not present

## 2019-04-28 DIAGNOSIS — I779 Disorder of arteries and arterioles, unspecified: Secondary | ICD-10-CM | POA: Diagnosis not present

## 2019-04-28 DIAGNOSIS — Z515 Encounter for palliative care: Secondary | ICD-10-CM | POA: Diagnosis not present

## 2019-04-28 DIAGNOSIS — F0280 Dementia in other diseases classified elsewhere without behavioral disturbance: Secondary | ICD-10-CM | POA: Diagnosis not present

## 2019-04-28 DIAGNOSIS — I10 Essential (primary) hypertension: Secondary | ICD-10-CM | POA: Diagnosis not present

## 2019-04-28 DIAGNOSIS — R410 Disorientation, unspecified: Secondary | ICD-10-CM | POA: Diagnosis not present

## 2019-04-28 DIAGNOSIS — R296 Repeated falls: Secondary | ICD-10-CM | POA: Diagnosis not present

## 2019-04-28 DIAGNOSIS — Z66 Do not resuscitate: Secondary | ICD-10-CM | POA: Diagnosis not present

## 2019-04-28 DIAGNOSIS — I6523 Occlusion and stenosis of bilateral carotid arteries: Secondary | ICD-10-CM | POA: Diagnosis not present

## 2019-04-28 DIAGNOSIS — R634 Abnormal weight loss: Secondary | ICD-10-CM | POA: Diagnosis not present

## 2019-04-29 DIAGNOSIS — F0280 Dementia in other diseases classified elsewhere without behavioral disturbance: Secondary | ICD-10-CM | POA: Diagnosis not present

## 2019-04-29 DIAGNOSIS — I779 Disorder of arteries and arterioles, unspecified: Secondary | ICD-10-CM | POA: Diagnosis not present

## 2019-04-29 DIAGNOSIS — E569 Vitamin deficiency, unspecified: Secondary | ICD-10-CM | POA: Diagnosis not present

## 2019-04-29 DIAGNOSIS — Z515 Encounter for palliative care: Secondary | ICD-10-CM | POA: Diagnosis not present

## 2019-04-29 DIAGNOSIS — E559 Vitamin D deficiency, unspecified: Secondary | ICD-10-CM | POA: Diagnosis not present

## 2019-04-29 DIAGNOSIS — Z66 Do not resuscitate: Secondary | ICD-10-CM | POA: Diagnosis not present

## 2019-04-29 DIAGNOSIS — R296 Repeated falls: Secondary | ICD-10-CM | POA: Diagnosis not present

## 2019-04-29 DIAGNOSIS — E039 Hypothyroidism, unspecified: Secondary | ICD-10-CM | POA: Diagnosis not present

## 2019-04-29 DIAGNOSIS — R451 Restlessness and agitation: Secondary | ICD-10-CM | POA: Diagnosis not present

## 2019-04-29 DIAGNOSIS — G300 Alzheimer's disease with early onset: Secondary | ICD-10-CM | POA: Diagnosis not present

## 2019-04-29 DIAGNOSIS — I6523 Occlusion and stenosis of bilateral carotid arteries: Secondary | ICD-10-CM | POA: Diagnosis not present

## 2019-04-29 DIAGNOSIS — D519 Vitamin B12 deficiency anemia, unspecified: Secondary | ICD-10-CM | POA: Diagnosis not present

## 2019-04-29 DIAGNOSIS — D649 Anemia, unspecified: Secondary | ICD-10-CM | POA: Diagnosis not present

## 2019-04-29 DIAGNOSIS — I10 Essential (primary) hypertension: Secondary | ICD-10-CM | POA: Diagnosis not present

## 2019-04-29 DIAGNOSIS — R634 Abnormal weight loss: Secondary | ICD-10-CM | POA: Diagnosis not present

## 2019-04-29 DIAGNOSIS — R410 Disorientation, unspecified: Secondary | ICD-10-CM | POA: Diagnosis not present

## 2019-04-30 DIAGNOSIS — R410 Disorientation, unspecified: Secondary | ICD-10-CM | POA: Diagnosis not present

## 2019-04-30 DIAGNOSIS — F0280 Dementia in other diseases classified elsewhere without behavioral disturbance: Secondary | ICD-10-CM | POA: Diagnosis not present

## 2019-04-30 DIAGNOSIS — I779 Disorder of arteries and arterioles, unspecified: Secondary | ICD-10-CM | POA: Diagnosis not present

## 2019-04-30 DIAGNOSIS — G300 Alzheimer's disease with early onset: Secondary | ICD-10-CM | POA: Diagnosis not present

## 2019-04-30 DIAGNOSIS — I10 Essential (primary) hypertension: Secondary | ICD-10-CM | POA: Diagnosis not present

## 2019-04-30 DIAGNOSIS — R296 Repeated falls: Secondary | ICD-10-CM | POA: Diagnosis not present

## 2019-04-30 DIAGNOSIS — I6523 Occlusion and stenosis of bilateral carotid arteries: Secondary | ICD-10-CM | POA: Diagnosis not present

## 2019-04-30 DIAGNOSIS — R451 Restlessness and agitation: Secondary | ICD-10-CM | POA: Diagnosis not present

## 2019-04-30 DIAGNOSIS — Z515 Encounter for palliative care: Secondary | ICD-10-CM | POA: Diagnosis not present

## 2019-04-30 DIAGNOSIS — R634 Abnormal weight loss: Secondary | ICD-10-CM | POA: Diagnosis not present

## 2019-04-30 DIAGNOSIS — Z66 Do not resuscitate: Secondary | ICD-10-CM | POA: Diagnosis not present

## 2019-05-01 DIAGNOSIS — F0280 Dementia in other diseases classified elsewhere without behavioral disturbance: Secondary | ICD-10-CM | POA: Diagnosis not present

## 2019-05-01 DIAGNOSIS — Z66 Do not resuscitate: Secondary | ICD-10-CM | POA: Diagnosis not present

## 2019-05-01 DIAGNOSIS — I779 Disorder of arteries and arterioles, unspecified: Secondary | ICD-10-CM | POA: Diagnosis not present

## 2019-05-01 DIAGNOSIS — R296 Repeated falls: Secondary | ICD-10-CM | POA: Diagnosis not present

## 2019-05-01 DIAGNOSIS — I10 Essential (primary) hypertension: Secondary | ICD-10-CM | POA: Diagnosis not present

## 2019-05-01 DIAGNOSIS — G300 Alzheimer's disease with early onset: Secondary | ICD-10-CM | POA: Diagnosis not present

## 2019-05-01 DIAGNOSIS — R410 Disorientation, unspecified: Secondary | ICD-10-CM | POA: Diagnosis not present

## 2019-05-01 DIAGNOSIS — R451 Restlessness and agitation: Secondary | ICD-10-CM | POA: Diagnosis not present

## 2019-05-01 DIAGNOSIS — R634 Abnormal weight loss: Secondary | ICD-10-CM | POA: Diagnosis not present

## 2019-05-01 DIAGNOSIS — Z515 Encounter for palliative care: Secondary | ICD-10-CM | POA: Diagnosis not present

## 2019-05-01 DIAGNOSIS — I6523 Occlusion and stenosis of bilateral carotid arteries: Secondary | ICD-10-CM | POA: Diagnosis not present

## 2019-05-02 DIAGNOSIS — I10 Essential (primary) hypertension: Secondary | ICD-10-CM | POA: Diagnosis not present

## 2019-05-02 DIAGNOSIS — R451 Restlessness and agitation: Secondary | ICD-10-CM | POA: Diagnosis not present

## 2019-05-02 DIAGNOSIS — I779 Disorder of arteries and arterioles, unspecified: Secondary | ICD-10-CM | POA: Diagnosis not present

## 2019-05-02 DIAGNOSIS — Z66 Do not resuscitate: Secondary | ICD-10-CM | POA: Diagnosis not present

## 2019-05-02 DIAGNOSIS — I6523 Occlusion and stenosis of bilateral carotid arteries: Secondary | ICD-10-CM | POA: Diagnosis not present

## 2019-05-02 DIAGNOSIS — Z515 Encounter for palliative care: Secondary | ICD-10-CM | POA: Diagnosis not present

## 2019-05-02 DIAGNOSIS — R634 Abnormal weight loss: Secondary | ICD-10-CM | POA: Diagnosis not present

## 2019-05-02 DIAGNOSIS — F0280 Dementia in other diseases classified elsewhere without behavioral disturbance: Secondary | ICD-10-CM | POA: Diagnosis not present

## 2019-05-02 DIAGNOSIS — R410 Disorientation, unspecified: Secondary | ICD-10-CM | POA: Diagnosis not present

## 2019-05-02 DIAGNOSIS — R296 Repeated falls: Secondary | ICD-10-CM | POA: Diagnosis not present

## 2019-05-02 DIAGNOSIS — G300 Alzheimer's disease with early onset: Secondary | ICD-10-CM | POA: Diagnosis not present

## 2019-05-03 DIAGNOSIS — F0280 Dementia in other diseases classified elsewhere without behavioral disturbance: Secondary | ICD-10-CM | POA: Diagnosis not present

## 2019-05-03 DIAGNOSIS — I6523 Occlusion and stenosis of bilateral carotid arteries: Secondary | ICD-10-CM | POA: Diagnosis not present

## 2019-05-03 DIAGNOSIS — G300 Alzheimer's disease with early onset: Secondary | ICD-10-CM | POA: Diagnosis not present

## 2019-05-03 DIAGNOSIS — R410 Disorientation, unspecified: Secondary | ICD-10-CM | POA: Diagnosis not present

## 2019-05-03 DIAGNOSIS — Z515 Encounter for palliative care: Secondary | ICD-10-CM | POA: Diagnosis not present

## 2019-05-03 DIAGNOSIS — R451 Restlessness and agitation: Secondary | ICD-10-CM | POA: Diagnosis not present

## 2019-05-03 DIAGNOSIS — Z66 Do not resuscitate: Secondary | ICD-10-CM | POA: Diagnosis not present

## 2019-05-03 DIAGNOSIS — I10 Essential (primary) hypertension: Secondary | ICD-10-CM | POA: Diagnosis not present

## 2019-05-03 DIAGNOSIS — R296 Repeated falls: Secondary | ICD-10-CM | POA: Diagnosis not present

## 2019-05-03 DIAGNOSIS — I779 Disorder of arteries and arterioles, unspecified: Secondary | ICD-10-CM | POA: Diagnosis not present

## 2019-05-03 DIAGNOSIS — R634 Abnormal weight loss: Secondary | ICD-10-CM | POA: Diagnosis not present

## 2019-05-04 DIAGNOSIS — R410 Disorientation, unspecified: Secondary | ICD-10-CM | POA: Diagnosis not present

## 2019-05-04 DIAGNOSIS — I10 Essential (primary) hypertension: Secondary | ICD-10-CM | POA: Diagnosis not present

## 2019-05-04 DIAGNOSIS — R451 Restlessness and agitation: Secondary | ICD-10-CM | POA: Diagnosis not present

## 2019-05-04 DIAGNOSIS — Z515 Encounter for palliative care: Secondary | ICD-10-CM | POA: Diagnosis not present

## 2019-05-04 DIAGNOSIS — I6523 Occlusion and stenosis of bilateral carotid arteries: Secondary | ICD-10-CM | POA: Diagnosis not present

## 2019-05-04 DIAGNOSIS — G300 Alzheimer's disease with early onset: Secondary | ICD-10-CM | POA: Diagnosis not present

## 2019-05-04 DIAGNOSIS — R634 Abnormal weight loss: Secondary | ICD-10-CM | POA: Diagnosis not present

## 2019-05-04 DIAGNOSIS — I779 Disorder of arteries and arterioles, unspecified: Secondary | ICD-10-CM | POA: Diagnosis not present

## 2019-05-04 DIAGNOSIS — F0280 Dementia in other diseases classified elsewhere without behavioral disturbance: Secondary | ICD-10-CM | POA: Diagnosis not present

## 2019-05-04 DIAGNOSIS — Z66 Do not resuscitate: Secondary | ICD-10-CM | POA: Diagnosis not present

## 2019-05-04 DIAGNOSIS — R296 Repeated falls: Secondary | ICD-10-CM | POA: Diagnosis not present

## 2019-05-05 DIAGNOSIS — Z66 Do not resuscitate: Secondary | ICD-10-CM | POA: Diagnosis not present

## 2019-05-05 DIAGNOSIS — R54 Age-related physical debility: Secondary | ICD-10-CM | POA: Diagnosis not present

## 2019-05-05 DIAGNOSIS — R410 Disorientation, unspecified: Secondary | ICD-10-CM | POA: Diagnosis not present

## 2019-05-05 DIAGNOSIS — G300 Alzheimer's disease with early onset: Secondary | ICD-10-CM | POA: Diagnosis not present

## 2019-05-05 DIAGNOSIS — R627 Adult failure to thrive: Secondary | ICD-10-CM | POA: Diagnosis not present

## 2019-05-05 DIAGNOSIS — R634 Abnormal weight loss: Secondary | ICD-10-CM | POA: Diagnosis not present

## 2019-05-05 DIAGNOSIS — I69391 Dysphagia following cerebral infarction: Secondary | ICD-10-CM | POA: Diagnosis not present

## 2019-05-05 DIAGNOSIS — R32 Unspecified urinary incontinence: Secondary | ICD-10-CM | POA: Diagnosis not present

## 2019-05-05 DIAGNOSIS — R296 Repeated falls: Secondary | ICD-10-CM | POA: Diagnosis not present

## 2019-05-05 DIAGNOSIS — I6523 Occlusion and stenosis of bilateral carotid arteries: Secondary | ICD-10-CM | POA: Diagnosis not present

## 2019-05-05 DIAGNOSIS — I10 Essential (primary) hypertension: Secondary | ICD-10-CM | POA: Diagnosis not present

## 2019-05-05 DIAGNOSIS — Z515 Encounter for palliative care: Secondary | ICD-10-CM | POA: Diagnosis not present

## 2019-05-05 DIAGNOSIS — F0280 Dementia in other diseases classified elsewhere without behavioral disturbance: Secondary | ICD-10-CM | POA: Diagnosis not present

## 2019-05-05 DIAGNOSIS — I779 Disorder of arteries and arterioles, unspecified: Secondary | ICD-10-CM | POA: Diagnosis not present

## 2019-05-05 DIAGNOSIS — R451 Restlessness and agitation: Secondary | ICD-10-CM | POA: Diagnosis not present

## 2019-05-06 DIAGNOSIS — G300 Alzheimer's disease with early onset: Secondary | ICD-10-CM | POA: Diagnosis not present

## 2019-05-06 DIAGNOSIS — R296 Repeated falls: Secondary | ICD-10-CM | POA: Diagnosis not present

## 2019-05-06 DIAGNOSIS — R634 Abnormal weight loss: Secondary | ICD-10-CM | POA: Diagnosis not present

## 2019-05-06 DIAGNOSIS — I779 Disorder of arteries and arterioles, unspecified: Secondary | ICD-10-CM | POA: Diagnosis not present

## 2019-05-06 DIAGNOSIS — R451 Restlessness and agitation: Secondary | ICD-10-CM | POA: Diagnosis not present

## 2019-05-06 DIAGNOSIS — I6523 Occlusion and stenosis of bilateral carotid arteries: Secondary | ICD-10-CM | POA: Diagnosis not present

## 2019-05-06 DIAGNOSIS — F0280 Dementia in other diseases classified elsewhere without behavioral disturbance: Secondary | ICD-10-CM | POA: Diagnosis not present

## 2019-05-06 DIAGNOSIS — R410 Disorientation, unspecified: Secondary | ICD-10-CM | POA: Diagnosis not present

## 2019-05-06 DIAGNOSIS — Z66 Do not resuscitate: Secondary | ICD-10-CM | POA: Diagnosis not present

## 2019-05-06 DIAGNOSIS — I10 Essential (primary) hypertension: Secondary | ICD-10-CM | POA: Diagnosis not present

## 2019-05-06 DIAGNOSIS — Z515 Encounter for palliative care: Secondary | ICD-10-CM | POA: Diagnosis not present

## 2019-05-07 DIAGNOSIS — F0280 Dementia in other diseases classified elsewhere without behavioral disturbance: Secondary | ICD-10-CM | POA: Diagnosis not present

## 2019-05-07 DIAGNOSIS — R634 Abnormal weight loss: Secondary | ICD-10-CM | POA: Diagnosis not present

## 2019-05-07 DIAGNOSIS — Z66 Do not resuscitate: Secondary | ICD-10-CM | POA: Diagnosis not present

## 2019-05-07 DIAGNOSIS — R410 Disorientation, unspecified: Secondary | ICD-10-CM | POA: Diagnosis not present

## 2019-05-07 DIAGNOSIS — R296 Repeated falls: Secondary | ICD-10-CM | POA: Diagnosis not present

## 2019-05-07 DIAGNOSIS — R451 Restlessness and agitation: Secondary | ICD-10-CM | POA: Diagnosis not present

## 2019-05-07 DIAGNOSIS — Z515 Encounter for palliative care: Secondary | ICD-10-CM | POA: Diagnosis not present

## 2019-05-07 DIAGNOSIS — I779 Disorder of arteries and arterioles, unspecified: Secondary | ICD-10-CM | POA: Diagnosis not present

## 2019-05-07 DIAGNOSIS — I6523 Occlusion and stenosis of bilateral carotid arteries: Secondary | ICD-10-CM | POA: Diagnosis not present

## 2019-05-07 DIAGNOSIS — I10 Essential (primary) hypertension: Secondary | ICD-10-CM | POA: Diagnosis not present

## 2019-05-07 DIAGNOSIS — G300 Alzheimer's disease with early onset: Secondary | ICD-10-CM | POA: Diagnosis not present

## 2019-05-08 DIAGNOSIS — R410 Disorientation, unspecified: Secondary | ICD-10-CM | POA: Diagnosis not present

## 2019-05-08 DIAGNOSIS — R296 Repeated falls: Secondary | ICD-10-CM | POA: Diagnosis not present

## 2019-05-08 DIAGNOSIS — G300 Alzheimer's disease with early onset: Secondary | ICD-10-CM | POA: Diagnosis not present

## 2019-05-08 DIAGNOSIS — Z515 Encounter for palliative care: Secondary | ICD-10-CM | POA: Diagnosis not present

## 2019-05-08 DIAGNOSIS — I10 Essential (primary) hypertension: Secondary | ICD-10-CM | POA: Diagnosis not present

## 2019-05-08 DIAGNOSIS — R634 Abnormal weight loss: Secondary | ICD-10-CM | POA: Diagnosis not present

## 2019-05-08 DIAGNOSIS — I6523 Occlusion and stenosis of bilateral carotid arteries: Secondary | ICD-10-CM | POA: Diagnosis not present

## 2019-05-08 DIAGNOSIS — Z66 Do not resuscitate: Secondary | ICD-10-CM | POA: Diagnosis not present

## 2019-05-08 DIAGNOSIS — I779 Disorder of arteries and arterioles, unspecified: Secondary | ICD-10-CM | POA: Diagnosis not present

## 2019-05-08 DIAGNOSIS — F0280 Dementia in other diseases classified elsewhere without behavioral disturbance: Secondary | ICD-10-CM | POA: Diagnosis not present

## 2019-05-08 DIAGNOSIS — R451 Restlessness and agitation: Secondary | ICD-10-CM | POA: Diagnosis not present

## 2019-05-09 DIAGNOSIS — R296 Repeated falls: Secondary | ICD-10-CM | POA: Diagnosis not present

## 2019-05-09 DIAGNOSIS — G300 Alzheimer's disease with early onset: Secondary | ICD-10-CM | POA: Diagnosis not present

## 2019-05-09 DIAGNOSIS — Z66 Do not resuscitate: Secondary | ICD-10-CM | POA: Diagnosis not present

## 2019-05-09 DIAGNOSIS — R634 Abnormal weight loss: Secondary | ICD-10-CM | POA: Diagnosis not present

## 2019-05-09 DIAGNOSIS — I6523 Occlusion and stenosis of bilateral carotid arteries: Secondary | ICD-10-CM | POA: Diagnosis not present

## 2019-05-09 DIAGNOSIS — I10 Essential (primary) hypertension: Secondary | ICD-10-CM | POA: Diagnosis not present

## 2019-05-09 DIAGNOSIS — R451 Restlessness and agitation: Secondary | ICD-10-CM | POA: Diagnosis not present

## 2019-05-09 DIAGNOSIS — R410 Disorientation, unspecified: Secondary | ICD-10-CM | POA: Diagnosis not present

## 2019-05-09 DIAGNOSIS — Z515 Encounter for palliative care: Secondary | ICD-10-CM | POA: Diagnosis not present

## 2019-05-09 DIAGNOSIS — F0280 Dementia in other diseases classified elsewhere without behavioral disturbance: Secondary | ICD-10-CM | POA: Diagnosis not present

## 2019-05-09 DIAGNOSIS — I779 Disorder of arteries and arterioles, unspecified: Secondary | ICD-10-CM | POA: Diagnosis not present

## 2019-05-10 DIAGNOSIS — I6523 Occlusion and stenosis of bilateral carotid arteries: Secondary | ICD-10-CM | POA: Diagnosis not present

## 2019-05-10 DIAGNOSIS — R410 Disorientation, unspecified: Secondary | ICD-10-CM | POA: Diagnosis not present

## 2019-05-10 DIAGNOSIS — G300 Alzheimer's disease with early onset: Secondary | ICD-10-CM | POA: Diagnosis not present

## 2019-05-10 DIAGNOSIS — F0280 Dementia in other diseases classified elsewhere without behavioral disturbance: Secondary | ICD-10-CM | POA: Diagnosis not present

## 2019-05-10 DIAGNOSIS — R634 Abnormal weight loss: Secondary | ICD-10-CM | POA: Diagnosis not present

## 2019-05-10 DIAGNOSIS — R296 Repeated falls: Secondary | ICD-10-CM | POA: Diagnosis not present

## 2019-05-10 DIAGNOSIS — Z66 Do not resuscitate: Secondary | ICD-10-CM | POA: Diagnosis not present

## 2019-05-10 DIAGNOSIS — Z515 Encounter for palliative care: Secondary | ICD-10-CM | POA: Diagnosis not present

## 2019-05-10 DIAGNOSIS — R451 Restlessness and agitation: Secondary | ICD-10-CM | POA: Diagnosis not present

## 2019-05-10 DIAGNOSIS — I779 Disorder of arteries and arterioles, unspecified: Secondary | ICD-10-CM | POA: Diagnosis not present

## 2019-05-10 DIAGNOSIS — I10 Essential (primary) hypertension: Secondary | ICD-10-CM | POA: Diagnosis not present

## 2019-05-11 DIAGNOSIS — R451 Restlessness and agitation: Secondary | ICD-10-CM | POA: Diagnosis not present

## 2019-05-11 DIAGNOSIS — Z66 Do not resuscitate: Secondary | ICD-10-CM | POA: Diagnosis not present

## 2019-05-11 DIAGNOSIS — R296 Repeated falls: Secondary | ICD-10-CM | POA: Diagnosis not present

## 2019-05-11 DIAGNOSIS — I779 Disorder of arteries and arterioles, unspecified: Secondary | ICD-10-CM | POA: Diagnosis not present

## 2019-05-11 DIAGNOSIS — F0280 Dementia in other diseases classified elsewhere without behavioral disturbance: Secondary | ICD-10-CM | POA: Diagnosis not present

## 2019-05-11 DIAGNOSIS — R410 Disorientation, unspecified: Secondary | ICD-10-CM | POA: Diagnosis not present

## 2019-05-11 DIAGNOSIS — I10 Essential (primary) hypertension: Secondary | ICD-10-CM | POA: Diagnosis not present

## 2019-05-11 DIAGNOSIS — R634 Abnormal weight loss: Secondary | ICD-10-CM | POA: Diagnosis not present

## 2019-05-11 DIAGNOSIS — I6523 Occlusion and stenosis of bilateral carotid arteries: Secondary | ICD-10-CM | POA: Diagnosis not present

## 2019-05-11 DIAGNOSIS — G300 Alzheimer's disease with early onset: Secondary | ICD-10-CM | POA: Diagnosis not present

## 2019-05-11 DIAGNOSIS — Z515 Encounter for palliative care: Secondary | ICD-10-CM | POA: Diagnosis not present

## 2019-05-12 DIAGNOSIS — R451 Restlessness and agitation: Secondary | ICD-10-CM | POA: Diagnosis not present

## 2019-05-12 DIAGNOSIS — I10 Essential (primary) hypertension: Secondary | ICD-10-CM | POA: Diagnosis not present

## 2019-05-12 DIAGNOSIS — Z515 Encounter for palliative care: Secondary | ICD-10-CM | POA: Diagnosis not present

## 2019-05-12 DIAGNOSIS — Z66 Do not resuscitate: Secondary | ICD-10-CM | POA: Diagnosis not present

## 2019-05-12 DIAGNOSIS — F0280 Dementia in other diseases classified elsewhere without behavioral disturbance: Secondary | ICD-10-CM | POA: Diagnosis not present

## 2019-05-12 DIAGNOSIS — R296 Repeated falls: Secondary | ICD-10-CM | POA: Diagnosis not present

## 2019-05-12 DIAGNOSIS — I779 Disorder of arteries and arterioles, unspecified: Secondary | ICD-10-CM | POA: Diagnosis not present

## 2019-05-12 DIAGNOSIS — R634 Abnormal weight loss: Secondary | ICD-10-CM | POA: Diagnosis not present

## 2019-05-12 DIAGNOSIS — R410 Disorientation, unspecified: Secondary | ICD-10-CM | POA: Diagnosis not present

## 2019-05-12 DIAGNOSIS — I6523 Occlusion and stenosis of bilateral carotid arteries: Secondary | ICD-10-CM | POA: Diagnosis not present

## 2019-05-12 DIAGNOSIS — G300 Alzheimer's disease with early onset: Secondary | ICD-10-CM | POA: Diagnosis not present

## 2019-05-13 DIAGNOSIS — Z515 Encounter for palliative care: Secondary | ICD-10-CM | POA: Diagnosis not present

## 2019-05-13 DIAGNOSIS — Z66 Do not resuscitate: Secondary | ICD-10-CM | POA: Diagnosis not present

## 2019-05-13 DIAGNOSIS — R410 Disorientation, unspecified: Secondary | ICD-10-CM | POA: Diagnosis not present

## 2019-05-13 DIAGNOSIS — R451 Restlessness and agitation: Secondary | ICD-10-CM | POA: Diagnosis not present

## 2019-05-13 DIAGNOSIS — I6523 Occlusion and stenosis of bilateral carotid arteries: Secondary | ICD-10-CM | POA: Diagnosis not present

## 2019-05-13 DIAGNOSIS — R296 Repeated falls: Secondary | ICD-10-CM | POA: Diagnosis not present

## 2019-05-13 DIAGNOSIS — I779 Disorder of arteries and arterioles, unspecified: Secondary | ICD-10-CM | POA: Diagnosis not present

## 2019-05-13 DIAGNOSIS — G300 Alzheimer's disease with early onset: Secondary | ICD-10-CM | POA: Diagnosis not present

## 2019-05-13 DIAGNOSIS — I10 Essential (primary) hypertension: Secondary | ICD-10-CM | POA: Diagnosis not present

## 2019-05-13 DIAGNOSIS — R634 Abnormal weight loss: Secondary | ICD-10-CM | POA: Diagnosis not present

## 2019-05-13 DIAGNOSIS — F0280 Dementia in other diseases classified elsewhere without behavioral disturbance: Secondary | ICD-10-CM | POA: Diagnosis not present

## 2019-05-14 DIAGNOSIS — R296 Repeated falls: Secondary | ICD-10-CM | POA: Diagnosis not present

## 2019-05-14 DIAGNOSIS — Z66 Do not resuscitate: Secondary | ICD-10-CM | POA: Diagnosis not present

## 2019-05-14 DIAGNOSIS — R451 Restlessness and agitation: Secondary | ICD-10-CM | POA: Diagnosis not present

## 2019-05-14 DIAGNOSIS — Z515 Encounter for palliative care: Secondary | ICD-10-CM | POA: Diagnosis not present

## 2019-05-14 DIAGNOSIS — I779 Disorder of arteries and arterioles, unspecified: Secondary | ICD-10-CM | POA: Diagnosis not present

## 2019-05-14 DIAGNOSIS — G300 Alzheimer's disease with early onset: Secondary | ICD-10-CM | POA: Diagnosis not present

## 2019-05-14 DIAGNOSIS — R410 Disorientation, unspecified: Secondary | ICD-10-CM | POA: Diagnosis not present

## 2019-05-14 DIAGNOSIS — I10 Essential (primary) hypertension: Secondary | ICD-10-CM | POA: Diagnosis not present

## 2019-05-14 DIAGNOSIS — F0280 Dementia in other diseases classified elsewhere without behavioral disturbance: Secondary | ICD-10-CM | POA: Diagnosis not present

## 2019-05-14 DIAGNOSIS — R634 Abnormal weight loss: Secondary | ICD-10-CM | POA: Diagnosis not present

## 2019-05-14 DIAGNOSIS — I6523 Occlusion and stenosis of bilateral carotid arteries: Secondary | ICD-10-CM | POA: Diagnosis not present

## 2019-05-15 DIAGNOSIS — Z515 Encounter for palliative care: Secondary | ICD-10-CM | POA: Diagnosis not present

## 2019-05-15 DIAGNOSIS — R296 Repeated falls: Secondary | ICD-10-CM | POA: Diagnosis not present

## 2019-05-15 DIAGNOSIS — R410 Disorientation, unspecified: Secondary | ICD-10-CM | POA: Diagnosis not present

## 2019-05-15 DIAGNOSIS — I779 Disorder of arteries and arterioles, unspecified: Secondary | ICD-10-CM | POA: Diagnosis not present

## 2019-05-15 DIAGNOSIS — F0280 Dementia in other diseases classified elsewhere without behavioral disturbance: Secondary | ICD-10-CM | POA: Diagnosis not present

## 2019-05-15 DIAGNOSIS — I10 Essential (primary) hypertension: Secondary | ICD-10-CM | POA: Diagnosis not present

## 2019-05-15 DIAGNOSIS — I6523 Occlusion and stenosis of bilateral carotid arteries: Secondary | ICD-10-CM | POA: Diagnosis not present

## 2019-05-15 DIAGNOSIS — Z66 Do not resuscitate: Secondary | ICD-10-CM | POA: Diagnosis not present

## 2019-05-15 DIAGNOSIS — R634 Abnormal weight loss: Secondary | ICD-10-CM | POA: Diagnosis not present

## 2019-05-15 DIAGNOSIS — G300 Alzheimer's disease with early onset: Secondary | ICD-10-CM | POA: Diagnosis not present

## 2019-05-15 DIAGNOSIS — R451 Restlessness and agitation: Secondary | ICD-10-CM | POA: Diagnosis not present

## 2019-05-16 DIAGNOSIS — R634 Abnormal weight loss: Secondary | ICD-10-CM | POA: Diagnosis not present

## 2019-05-16 DIAGNOSIS — G300 Alzheimer's disease with early onset: Secondary | ICD-10-CM | POA: Diagnosis not present

## 2019-05-16 DIAGNOSIS — R410 Disorientation, unspecified: Secondary | ICD-10-CM | POA: Diagnosis not present

## 2019-05-16 DIAGNOSIS — Z66 Do not resuscitate: Secondary | ICD-10-CM | POA: Diagnosis not present

## 2019-05-16 DIAGNOSIS — R451 Restlessness and agitation: Secondary | ICD-10-CM | POA: Diagnosis not present

## 2019-05-16 DIAGNOSIS — F0280 Dementia in other diseases classified elsewhere without behavioral disturbance: Secondary | ICD-10-CM | POA: Diagnosis not present

## 2019-05-16 DIAGNOSIS — Z515 Encounter for palliative care: Secondary | ICD-10-CM | POA: Diagnosis not present

## 2019-05-16 DIAGNOSIS — I779 Disorder of arteries and arterioles, unspecified: Secondary | ICD-10-CM | POA: Diagnosis not present

## 2019-05-16 DIAGNOSIS — R296 Repeated falls: Secondary | ICD-10-CM | POA: Diagnosis not present

## 2019-05-16 DIAGNOSIS — I6523 Occlusion and stenosis of bilateral carotid arteries: Secondary | ICD-10-CM | POA: Diagnosis not present

## 2019-05-16 DIAGNOSIS — I10 Essential (primary) hypertension: Secondary | ICD-10-CM | POA: Diagnosis not present

## 2019-05-17 DIAGNOSIS — I6523 Occlusion and stenosis of bilateral carotid arteries: Secondary | ICD-10-CM | POA: Diagnosis not present

## 2019-05-17 DIAGNOSIS — R451 Restlessness and agitation: Secondary | ICD-10-CM | POA: Diagnosis not present

## 2019-05-17 DIAGNOSIS — Z515 Encounter for palliative care: Secondary | ICD-10-CM | POA: Diagnosis not present

## 2019-05-17 DIAGNOSIS — R634 Abnormal weight loss: Secondary | ICD-10-CM | POA: Diagnosis not present

## 2019-05-17 DIAGNOSIS — R296 Repeated falls: Secondary | ICD-10-CM | POA: Diagnosis not present

## 2019-05-17 DIAGNOSIS — I10 Essential (primary) hypertension: Secondary | ICD-10-CM | POA: Diagnosis not present

## 2019-05-17 DIAGNOSIS — F0280 Dementia in other diseases classified elsewhere without behavioral disturbance: Secondary | ICD-10-CM | POA: Diagnosis not present

## 2019-05-17 DIAGNOSIS — G300 Alzheimer's disease with early onset: Secondary | ICD-10-CM | POA: Diagnosis not present

## 2019-05-17 DIAGNOSIS — I779 Disorder of arteries and arterioles, unspecified: Secondary | ICD-10-CM | POA: Diagnosis not present

## 2019-05-17 DIAGNOSIS — R410 Disorientation, unspecified: Secondary | ICD-10-CM | POA: Diagnosis not present

## 2019-05-17 DIAGNOSIS — Z66 Do not resuscitate: Secondary | ICD-10-CM | POA: Diagnosis not present

## 2019-05-18 DIAGNOSIS — R296 Repeated falls: Secondary | ICD-10-CM | POA: Diagnosis not present

## 2019-05-18 DIAGNOSIS — Z66 Do not resuscitate: Secondary | ICD-10-CM | POA: Diagnosis not present

## 2019-05-18 DIAGNOSIS — R451 Restlessness and agitation: Secondary | ICD-10-CM | POA: Diagnosis not present

## 2019-05-18 DIAGNOSIS — F0280 Dementia in other diseases classified elsewhere without behavioral disturbance: Secondary | ICD-10-CM | POA: Diagnosis not present

## 2019-05-18 DIAGNOSIS — I779 Disorder of arteries and arterioles, unspecified: Secondary | ICD-10-CM | POA: Diagnosis not present

## 2019-05-18 DIAGNOSIS — I6523 Occlusion and stenosis of bilateral carotid arteries: Secondary | ICD-10-CM | POA: Diagnosis not present

## 2019-05-18 DIAGNOSIS — Z515 Encounter for palliative care: Secondary | ICD-10-CM | POA: Diagnosis not present

## 2019-05-18 DIAGNOSIS — G300 Alzheimer's disease with early onset: Secondary | ICD-10-CM | POA: Diagnosis not present

## 2019-05-18 DIAGNOSIS — R410 Disorientation, unspecified: Secondary | ICD-10-CM | POA: Diagnosis not present

## 2019-05-18 DIAGNOSIS — I10 Essential (primary) hypertension: Secondary | ICD-10-CM | POA: Diagnosis not present

## 2019-05-18 DIAGNOSIS — R634 Abnormal weight loss: Secondary | ICD-10-CM | POA: Diagnosis not present

## 2019-05-19 DIAGNOSIS — R410 Disorientation, unspecified: Secondary | ICD-10-CM | POA: Diagnosis not present

## 2019-05-19 DIAGNOSIS — Z66 Do not resuscitate: Secondary | ICD-10-CM | POA: Diagnosis not present

## 2019-05-19 DIAGNOSIS — I6523 Occlusion and stenosis of bilateral carotid arteries: Secondary | ICD-10-CM | POA: Diagnosis not present

## 2019-05-19 DIAGNOSIS — G300 Alzheimer's disease with early onset: Secondary | ICD-10-CM | POA: Diagnosis not present

## 2019-05-19 DIAGNOSIS — I10 Essential (primary) hypertension: Secondary | ICD-10-CM | POA: Diagnosis not present

## 2019-05-19 DIAGNOSIS — R451 Restlessness and agitation: Secondary | ICD-10-CM | POA: Diagnosis not present

## 2019-05-19 DIAGNOSIS — I779 Disorder of arteries and arterioles, unspecified: Secondary | ICD-10-CM | POA: Diagnosis not present

## 2019-05-19 DIAGNOSIS — R634 Abnormal weight loss: Secondary | ICD-10-CM | POA: Diagnosis not present

## 2019-05-19 DIAGNOSIS — R296 Repeated falls: Secondary | ICD-10-CM | POA: Diagnosis not present

## 2019-05-19 DIAGNOSIS — Z515 Encounter for palliative care: Secondary | ICD-10-CM | POA: Diagnosis not present

## 2019-05-19 DIAGNOSIS — F0280 Dementia in other diseases classified elsewhere without behavioral disturbance: Secondary | ICD-10-CM | POA: Diagnosis not present

## 2019-05-20 DIAGNOSIS — Z66 Do not resuscitate: Secondary | ICD-10-CM | POA: Diagnosis not present

## 2019-05-20 DIAGNOSIS — G300 Alzheimer's disease with early onset: Secondary | ICD-10-CM | POA: Diagnosis not present

## 2019-05-20 DIAGNOSIS — I6523 Occlusion and stenosis of bilateral carotid arteries: Secondary | ICD-10-CM | POA: Diagnosis not present

## 2019-05-20 DIAGNOSIS — F0280 Dementia in other diseases classified elsewhere without behavioral disturbance: Secondary | ICD-10-CM | POA: Diagnosis not present

## 2019-05-20 DIAGNOSIS — R451 Restlessness and agitation: Secondary | ICD-10-CM | POA: Diagnosis not present

## 2019-05-20 DIAGNOSIS — I779 Disorder of arteries and arterioles, unspecified: Secondary | ICD-10-CM | POA: Diagnosis not present

## 2019-05-20 DIAGNOSIS — I10 Essential (primary) hypertension: Secondary | ICD-10-CM | POA: Diagnosis not present

## 2019-05-20 DIAGNOSIS — R410 Disorientation, unspecified: Secondary | ICD-10-CM | POA: Diagnosis not present

## 2019-05-20 DIAGNOSIS — R296 Repeated falls: Secondary | ICD-10-CM | POA: Diagnosis not present

## 2019-05-20 DIAGNOSIS — R634 Abnormal weight loss: Secondary | ICD-10-CM | POA: Diagnosis not present

## 2019-05-20 DIAGNOSIS — Z515 Encounter for palliative care: Secondary | ICD-10-CM | POA: Diagnosis not present

## 2019-05-21 DIAGNOSIS — R410 Disorientation, unspecified: Secondary | ICD-10-CM | POA: Diagnosis not present

## 2019-05-21 DIAGNOSIS — I779 Disorder of arteries and arterioles, unspecified: Secondary | ICD-10-CM | POA: Diagnosis not present

## 2019-05-21 DIAGNOSIS — F0280 Dementia in other diseases classified elsewhere without behavioral disturbance: Secondary | ICD-10-CM | POA: Diagnosis not present

## 2019-05-21 DIAGNOSIS — Z515 Encounter for palliative care: Secondary | ICD-10-CM | POA: Diagnosis not present

## 2019-05-21 DIAGNOSIS — I10 Essential (primary) hypertension: Secondary | ICD-10-CM | POA: Diagnosis not present

## 2019-05-21 DIAGNOSIS — G300 Alzheimer's disease with early onset: Secondary | ICD-10-CM | POA: Diagnosis not present

## 2019-05-21 DIAGNOSIS — R634 Abnormal weight loss: Secondary | ICD-10-CM | POA: Diagnosis not present

## 2019-05-21 DIAGNOSIS — I6523 Occlusion and stenosis of bilateral carotid arteries: Secondary | ICD-10-CM | POA: Diagnosis not present

## 2019-05-21 DIAGNOSIS — R451 Restlessness and agitation: Secondary | ICD-10-CM | POA: Diagnosis not present

## 2019-05-21 DIAGNOSIS — Z66 Do not resuscitate: Secondary | ICD-10-CM | POA: Diagnosis not present

## 2019-05-21 DIAGNOSIS — R296 Repeated falls: Secondary | ICD-10-CM | POA: Diagnosis not present

## 2019-05-22 DIAGNOSIS — R451 Restlessness and agitation: Secondary | ICD-10-CM | POA: Diagnosis not present

## 2019-05-22 DIAGNOSIS — I779 Disorder of arteries and arterioles, unspecified: Secondary | ICD-10-CM | POA: Diagnosis not present

## 2019-05-22 DIAGNOSIS — I6523 Occlusion and stenosis of bilateral carotid arteries: Secondary | ICD-10-CM | POA: Diagnosis not present

## 2019-05-22 DIAGNOSIS — Z515 Encounter for palliative care: Secondary | ICD-10-CM | POA: Diagnosis not present

## 2019-05-22 DIAGNOSIS — I10 Essential (primary) hypertension: Secondary | ICD-10-CM | POA: Diagnosis not present

## 2019-05-22 DIAGNOSIS — R634 Abnormal weight loss: Secondary | ICD-10-CM | POA: Diagnosis not present

## 2019-05-22 DIAGNOSIS — G300 Alzheimer's disease with early onset: Secondary | ICD-10-CM | POA: Diagnosis not present

## 2019-05-22 DIAGNOSIS — F0280 Dementia in other diseases classified elsewhere without behavioral disturbance: Secondary | ICD-10-CM | POA: Diagnosis not present

## 2019-05-22 DIAGNOSIS — R410 Disorientation, unspecified: Secondary | ICD-10-CM | POA: Diagnosis not present

## 2019-05-22 DIAGNOSIS — Z66 Do not resuscitate: Secondary | ICD-10-CM | POA: Diagnosis not present

## 2019-05-22 DIAGNOSIS — R296 Repeated falls: Secondary | ICD-10-CM | POA: Diagnosis not present

## 2019-05-23 DIAGNOSIS — R451 Restlessness and agitation: Secondary | ICD-10-CM | POA: Diagnosis not present

## 2019-05-23 DIAGNOSIS — I6523 Occlusion and stenosis of bilateral carotid arteries: Secondary | ICD-10-CM | POA: Diagnosis not present

## 2019-05-23 DIAGNOSIS — R634 Abnormal weight loss: Secondary | ICD-10-CM | POA: Diagnosis not present

## 2019-05-23 DIAGNOSIS — Z515 Encounter for palliative care: Secondary | ICD-10-CM | POA: Diagnosis not present

## 2019-05-23 DIAGNOSIS — F0280 Dementia in other diseases classified elsewhere without behavioral disturbance: Secondary | ICD-10-CM | POA: Diagnosis not present

## 2019-05-23 DIAGNOSIS — Z66 Do not resuscitate: Secondary | ICD-10-CM | POA: Diagnosis not present

## 2019-05-23 DIAGNOSIS — R296 Repeated falls: Secondary | ICD-10-CM | POA: Diagnosis not present

## 2019-05-23 DIAGNOSIS — G300 Alzheimer's disease with early onset: Secondary | ICD-10-CM | POA: Diagnosis not present

## 2019-05-23 DIAGNOSIS — I10 Essential (primary) hypertension: Secondary | ICD-10-CM | POA: Diagnosis not present

## 2019-05-23 DIAGNOSIS — R410 Disorientation, unspecified: Secondary | ICD-10-CM | POA: Diagnosis not present

## 2019-05-23 DIAGNOSIS — I779 Disorder of arteries and arterioles, unspecified: Secondary | ICD-10-CM | POA: Diagnosis not present

## 2019-05-24 DIAGNOSIS — R634 Abnormal weight loss: Secondary | ICD-10-CM | POA: Diagnosis not present

## 2019-05-24 DIAGNOSIS — R410 Disorientation, unspecified: Secondary | ICD-10-CM | POA: Diagnosis not present

## 2019-05-24 DIAGNOSIS — I10 Essential (primary) hypertension: Secondary | ICD-10-CM | POA: Diagnosis not present

## 2019-05-24 DIAGNOSIS — R451 Restlessness and agitation: Secondary | ICD-10-CM | POA: Diagnosis not present

## 2019-05-24 DIAGNOSIS — Z515 Encounter for palliative care: Secondary | ICD-10-CM | POA: Diagnosis not present

## 2019-05-24 DIAGNOSIS — I6523 Occlusion and stenosis of bilateral carotid arteries: Secondary | ICD-10-CM | POA: Diagnosis not present

## 2019-05-24 DIAGNOSIS — Z66 Do not resuscitate: Secondary | ICD-10-CM | POA: Diagnosis not present

## 2019-05-24 DIAGNOSIS — I779 Disorder of arteries and arterioles, unspecified: Secondary | ICD-10-CM | POA: Diagnosis not present

## 2019-05-24 DIAGNOSIS — G300 Alzheimer's disease with early onset: Secondary | ICD-10-CM | POA: Diagnosis not present

## 2019-05-24 DIAGNOSIS — F0280 Dementia in other diseases classified elsewhere without behavioral disturbance: Secondary | ICD-10-CM | POA: Diagnosis not present

## 2019-05-24 DIAGNOSIS — R296 Repeated falls: Secondary | ICD-10-CM | POA: Diagnosis not present

## 2019-05-25 DIAGNOSIS — R410 Disorientation, unspecified: Secondary | ICD-10-CM | POA: Diagnosis not present

## 2019-05-25 DIAGNOSIS — I779 Disorder of arteries and arterioles, unspecified: Secondary | ICD-10-CM | POA: Diagnosis not present

## 2019-05-25 DIAGNOSIS — I10 Essential (primary) hypertension: Secondary | ICD-10-CM | POA: Diagnosis not present

## 2019-05-25 DIAGNOSIS — R296 Repeated falls: Secondary | ICD-10-CM | POA: Diagnosis not present

## 2019-05-25 DIAGNOSIS — Z66 Do not resuscitate: Secondary | ICD-10-CM | POA: Diagnosis not present

## 2019-05-25 DIAGNOSIS — F0280 Dementia in other diseases classified elsewhere without behavioral disturbance: Secondary | ICD-10-CM | POA: Diagnosis not present

## 2019-05-25 DIAGNOSIS — R634 Abnormal weight loss: Secondary | ICD-10-CM | POA: Diagnosis not present

## 2019-05-25 DIAGNOSIS — Z515 Encounter for palliative care: Secondary | ICD-10-CM | POA: Diagnosis not present

## 2019-05-25 DIAGNOSIS — R451 Restlessness and agitation: Secondary | ICD-10-CM | POA: Diagnosis not present

## 2019-05-25 DIAGNOSIS — G300 Alzheimer's disease with early onset: Secondary | ICD-10-CM | POA: Diagnosis not present

## 2019-05-25 DIAGNOSIS — I6523 Occlusion and stenosis of bilateral carotid arteries: Secondary | ICD-10-CM | POA: Diagnosis not present

## 2019-05-26 DIAGNOSIS — I6523 Occlusion and stenosis of bilateral carotid arteries: Secondary | ICD-10-CM | POA: Diagnosis not present

## 2019-05-26 DIAGNOSIS — R410 Disorientation, unspecified: Secondary | ICD-10-CM | POA: Diagnosis not present

## 2019-05-26 DIAGNOSIS — I10 Essential (primary) hypertension: Secondary | ICD-10-CM | POA: Diagnosis not present

## 2019-05-26 DIAGNOSIS — Z515 Encounter for palliative care: Secondary | ICD-10-CM | POA: Diagnosis not present

## 2019-05-26 DIAGNOSIS — G300 Alzheimer's disease with early onset: Secondary | ICD-10-CM | POA: Diagnosis not present

## 2019-05-26 DIAGNOSIS — R634 Abnormal weight loss: Secondary | ICD-10-CM | POA: Diagnosis not present

## 2019-05-26 DIAGNOSIS — I779 Disorder of arteries and arterioles, unspecified: Secondary | ICD-10-CM | POA: Diagnosis not present

## 2019-05-26 DIAGNOSIS — R451 Restlessness and agitation: Secondary | ICD-10-CM | POA: Diagnosis not present

## 2019-05-26 DIAGNOSIS — R296 Repeated falls: Secondary | ICD-10-CM | POA: Diagnosis not present

## 2019-05-26 DIAGNOSIS — Z66 Do not resuscitate: Secondary | ICD-10-CM | POA: Diagnosis not present

## 2019-05-26 DIAGNOSIS — F0280 Dementia in other diseases classified elsewhere without behavioral disturbance: Secondary | ICD-10-CM | POA: Diagnosis not present

## 2019-05-27 DIAGNOSIS — Z66 Do not resuscitate: Secondary | ICD-10-CM | POA: Diagnosis not present

## 2019-05-27 DIAGNOSIS — I6523 Occlusion and stenosis of bilateral carotid arteries: Secondary | ICD-10-CM | POA: Diagnosis not present

## 2019-05-27 DIAGNOSIS — R634 Abnormal weight loss: Secondary | ICD-10-CM | POA: Diagnosis not present

## 2019-05-27 DIAGNOSIS — R410 Disorientation, unspecified: Secondary | ICD-10-CM | POA: Diagnosis not present

## 2019-05-27 DIAGNOSIS — I779 Disorder of arteries and arterioles, unspecified: Secondary | ICD-10-CM | POA: Diagnosis not present

## 2019-05-27 DIAGNOSIS — I10 Essential (primary) hypertension: Secondary | ICD-10-CM | POA: Diagnosis not present

## 2019-05-27 DIAGNOSIS — R296 Repeated falls: Secondary | ICD-10-CM | POA: Diagnosis not present

## 2019-05-27 DIAGNOSIS — F0280 Dementia in other diseases classified elsewhere without behavioral disturbance: Secondary | ICD-10-CM | POA: Diagnosis not present

## 2019-05-27 DIAGNOSIS — Z515 Encounter for palliative care: Secondary | ICD-10-CM | POA: Diagnosis not present

## 2019-05-27 DIAGNOSIS — R451 Restlessness and agitation: Secondary | ICD-10-CM | POA: Diagnosis not present

## 2019-05-27 DIAGNOSIS — G300 Alzheimer's disease with early onset: Secondary | ICD-10-CM | POA: Diagnosis not present

## 2019-05-28 DIAGNOSIS — G300 Alzheimer's disease with early onset: Secondary | ICD-10-CM | POA: Diagnosis not present

## 2019-05-28 DIAGNOSIS — I6523 Occlusion and stenosis of bilateral carotid arteries: Secondary | ICD-10-CM | POA: Diagnosis not present

## 2019-05-28 DIAGNOSIS — I779 Disorder of arteries and arterioles, unspecified: Secondary | ICD-10-CM | POA: Diagnosis not present

## 2019-05-28 DIAGNOSIS — R296 Repeated falls: Secondary | ICD-10-CM | POA: Diagnosis not present

## 2019-05-28 DIAGNOSIS — Z515 Encounter for palliative care: Secondary | ICD-10-CM | POA: Diagnosis not present

## 2019-05-28 DIAGNOSIS — R451 Restlessness and agitation: Secondary | ICD-10-CM | POA: Diagnosis not present

## 2019-05-28 DIAGNOSIS — R634 Abnormal weight loss: Secondary | ICD-10-CM | POA: Diagnosis not present

## 2019-05-28 DIAGNOSIS — R410 Disorientation, unspecified: Secondary | ICD-10-CM | POA: Diagnosis not present

## 2019-05-28 DIAGNOSIS — F0280 Dementia in other diseases classified elsewhere without behavioral disturbance: Secondary | ICD-10-CM | POA: Diagnosis not present

## 2019-05-28 DIAGNOSIS — Z66 Do not resuscitate: Secondary | ICD-10-CM | POA: Diagnosis not present

## 2019-05-28 DIAGNOSIS — I10 Essential (primary) hypertension: Secondary | ICD-10-CM | POA: Diagnosis not present

## 2019-05-29 DIAGNOSIS — F0280 Dementia in other diseases classified elsewhere without behavioral disturbance: Secondary | ICD-10-CM | POA: Diagnosis not present

## 2019-05-29 DIAGNOSIS — I10 Essential (primary) hypertension: Secondary | ICD-10-CM | POA: Diagnosis not present

## 2019-05-29 DIAGNOSIS — R634 Abnormal weight loss: Secondary | ICD-10-CM | POA: Diagnosis not present

## 2019-05-29 DIAGNOSIS — Z66 Do not resuscitate: Secondary | ICD-10-CM | POA: Diagnosis not present

## 2019-05-29 DIAGNOSIS — R451 Restlessness and agitation: Secondary | ICD-10-CM | POA: Diagnosis not present

## 2019-05-29 DIAGNOSIS — R410 Disorientation, unspecified: Secondary | ICD-10-CM | POA: Diagnosis not present

## 2019-05-29 DIAGNOSIS — G300 Alzheimer's disease with early onset: Secondary | ICD-10-CM | POA: Diagnosis not present

## 2019-05-29 DIAGNOSIS — I6523 Occlusion and stenosis of bilateral carotid arteries: Secondary | ICD-10-CM | POA: Diagnosis not present

## 2019-05-29 DIAGNOSIS — Z515 Encounter for palliative care: Secondary | ICD-10-CM | POA: Diagnosis not present

## 2019-05-29 DIAGNOSIS — R296 Repeated falls: Secondary | ICD-10-CM | POA: Diagnosis not present

## 2019-05-29 DIAGNOSIS — I779 Disorder of arteries and arterioles, unspecified: Secondary | ICD-10-CM | POA: Diagnosis not present

## 2019-05-30 DIAGNOSIS — F0280 Dementia in other diseases classified elsewhere without behavioral disturbance: Secondary | ICD-10-CM | POA: Diagnosis not present

## 2019-05-30 DIAGNOSIS — Z515 Encounter for palliative care: Secondary | ICD-10-CM | POA: Diagnosis not present

## 2019-05-30 DIAGNOSIS — Z66 Do not resuscitate: Secondary | ICD-10-CM | POA: Diagnosis not present

## 2019-05-30 DIAGNOSIS — I10 Essential (primary) hypertension: Secondary | ICD-10-CM | POA: Diagnosis not present

## 2019-05-30 DIAGNOSIS — R296 Repeated falls: Secondary | ICD-10-CM | POA: Diagnosis not present

## 2019-05-30 DIAGNOSIS — R451 Restlessness and agitation: Secondary | ICD-10-CM | POA: Diagnosis not present

## 2019-05-30 DIAGNOSIS — R634 Abnormal weight loss: Secondary | ICD-10-CM | POA: Diagnosis not present

## 2019-05-30 DIAGNOSIS — I779 Disorder of arteries and arterioles, unspecified: Secondary | ICD-10-CM | POA: Diagnosis not present

## 2019-05-30 DIAGNOSIS — R410 Disorientation, unspecified: Secondary | ICD-10-CM | POA: Diagnosis not present

## 2019-05-30 DIAGNOSIS — G300 Alzheimer's disease with early onset: Secondary | ICD-10-CM | POA: Diagnosis not present

## 2019-05-30 DIAGNOSIS — I6523 Occlusion and stenosis of bilateral carotid arteries: Secondary | ICD-10-CM | POA: Diagnosis not present

## 2019-05-31 DIAGNOSIS — I779 Disorder of arteries and arterioles, unspecified: Secondary | ICD-10-CM | POA: Diagnosis not present

## 2019-05-31 DIAGNOSIS — Z515 Encounter for palliative care: Secondary | ICD-10-CM | POA: Diagnosis not present

## 2019-05-31 DIAGNOSIS — I6523 Occlusion and stenosis of bilateral carotid arteries: Secondary | ICD-10-CM | POA: Diagnosis not present

## 2019-05-31 DIAGNOSIS — I10 Essential (primary) hypertension: Secondary | ICD-10-CM | POA: Diagnosis not present

## 2019-05-31 DIAGNOSIS — F0280 Dementia in other diseases classified elsewhere without behavioral disturbance: Secondary | ICD-10-CM | POA: Diagnosis not present

## 2019-05-31 DIAGNOSIS — Z66 Do not resuscitate: Secondary | ICD-10-CM | POA: Diagnosis not present

## 2019-05-31 DIAGNOSIS — R296 Repeated falls: Secondary | ICD-10-CM | POA: Diagnosis not present

## 2019-05-31 DIAGNOSIS — R451 Restlessness and agitation: Secondary | ICD-10-CM | POA: Diagnosis not present

## 2019-05-31 DIAGNOSIS — R634 Abnormal weight loss: Secondary | ICD-10-CM | POA: Diagnosis not present

## 2019-05-31 DIAGNOSIS — R410 Disorientation, unspecified: Secondary | ICD-10-CM | POA: Diagnosis not present

## 2019-05-31 DIAGNOSIS — G300 Alzheimer's disease with early onset: Secondary | ICD-10-CM | POA: Diagnosis not present

## 2019-06-01 DIAGNOSIS — I779 Disorder of arteries and arterioles, unspecified: Secondary | ICD-10-CM | POA: Diagnosis not present

## 2019-06-01 DIAGNOSIS — F0280 Dementia in other diseases classified elsewhere without behavioral disturbance: Secondary | ICD-10-CM | POA: Diagnosis not present

## 2019-06-01 DIAGNOSIS — Z515 Encounter for palliative care: Secondary | ICD-10-CM | POA: Diagnosis not present

## 2019-06-01 DIAGNOSIS — R410 Disorientation, unspecified: Secondary | ICD-10-CM | POA: Diagnosis not present

## 2019-06-01 DIAGNOSIS — I10 Essential (primary) hypertension: Secondary | ICD-10-CM | POA: Diagnosis not present

## 2019-06-01 DIAGNOSIS — R634 Abnormal weight loss: Secondary | ICD-10-CM | POA: Diagnosis not present

## 2019-06-01 DIAGNOSIS — R451 Restlessness and agitation: Secondary | ICD-10-CM | POA: Diagnosis not present

## 2019-06-01 DIAGNOSIS — R296 Repeated falls: Secondary | ICD-10-CM | POA: Diagnosis not present

## 2019-06-01 DIAGNOSIS — I6523 Occlusion and stenosis of bilateral carotid arteries: Secondary | ICD-10-CM | POA: Diagnosis not present

## 2019-06-01 DIAGNOSIS — G300 Alzheimer's disease with early onset: Secondary | ICD-10-CM | POA: Diagnosis not present

## 2019-06-01 DIAGNOSIS — Z66 Do not resuscitate: Secondary | ICD-10-CM | POA: Diagnosis not present

## 2019-06-02 DIAGNOSIS — I6523 Occlusion and stenosis of bilateral carotid arteries: Secondary | ICD-10-CM | POA: Diagnosis not present

## 2019-06-02 DIAGNOSIS — Z66 Do not resuscitate: Secondary | ICD-10-CM | POA: Diagnosis not present

## 2019-06-02 DIAGNOSIS — R634 Abnormal weight loss: Secondary | ICD-10-CM | POA: Diagnosis not present

## 2019-06-02 DIAGNOSIS — I779 Disorder of arteries and arterioles, unspecified: Secondary | ICD-10-CM | POA: Diagnosis not present

## 2019-06-02 DIAGNOSIS — G300 Alzheimer's disease with early onset: Secondary | ICD-10-CM | POA: Diagnosis not present

## 2019-06-02 DIAGNOSIS — R296 Repeated falls: Secondary | ICD-10-CM | POA: Diagnosis not present

## 2019-06-02 DIAGNOSIS — R410 Disorientation, unspecified: Secondary | ICD-10-CM | POA: Diagnosis not present

## 2019-06-02 DIAGNOSIS — I10 Essential (primary) hypertension: Secondary | ICD-10-CM | POA: Diagnosis not present

## 2019-06-02 DIAGNOSIS — Z515 Encounter for palliative care: Secondary | ICD-10-CM | POA: Diagnosis not present

## 2019-06-02 DIAGNOSIS — R451 Restlessness and agitation: Secondary | ICD-10-CM | POA: Diagnosis not present

## 2019-06-02 DIAGNOSIS — F0280 Dementia in other diseases classified elsewhere without behavioral disturbance: Secondary | ICD-10-CM | POA: Diagnosis not present

## 2019-06-03 DIAGNOSIS — R410 Disorientation, unspecified: Secondary | ICD-10-CM | POA: Diagnosis not present

## 2019-06-03 DIAGNOSIS — R451 Restlessness and agitation: Secondary | ICD-10-CM | POA: Diagnosis not present

## 2019-06-03 DIAGNOSIS — R296 Repeated falls: Secondary | ICD-10-CM | POA: Diagnosis not present

## 2019-06-03 DIAGNOSIS — I779 Disorder of arteries and arterioles, unspecified: Secondary | ICD-10-CM | POA: Diagnosis not present

## 2019-06-03 DIAGNOSIS — Z66 Do not resuscitate: Secondary | ICD-10-CM | POA: Diagnosis not present

## 2019-06-03 DIAGNOSIS — Z515 Encounter for palliative care: Secondary | ICD-10-CM | POA: Diagnosis not present

## 2019-06-03 DIAGNOSIS — I10 Essential (primary) hypertension: Secondary | ICD-10-CM | POA: Diagnosis not present

## 2019-06-03 DIAGNOSIS — R634 Abnormal weight loss: Secondary | ICD-10-CM | POA: Diagnosis not present

## 2019-06-03 DIAGNOSIS — F0280 Dementia in other diseases classified elsewhere without behavioral disturbance: Secondary | ICD-10-CM | POA: Diagnosis not present

## 2019-06-03 DIAGNOSIS — I6523 Occlusion and stenosis of bilateral carotid arteries: Secondary | ICD-10-CM | POA: Diagnosis not present

## 2019-06-03 DIAGNOSIS — G300 Alzheimer's disease with early onset: Secondary | ICD-10-CM | POA: Diagnosis not present

## 2019-06-04 DIAGNOSIS — I10 Essential (primary) hypertension: Secondary | ICD-10-CM | POA: Diagnosis not present

## 2019-06-04 DIAGNOSIS — G300 Alzheimer's disease with early onset: Secondary | ICD-10-CM | POA: Diagnosis not present

## 2019-06-04 DIAGNOSIS — R296 Repeated falls: Secondary | ICD-10-CM | POA: Diagnosis not present

## 2019-06-04 DIAGNOSIS — I779 Disorder of arteries and arterioles, unspecified: Secondary | ICD-10-CM | POA: Diagnosis not present

## 2019-06-04 DIAGNOSIS — R634 Abnormal weight loss: Secondary | ICD-10-CM | POA: Diagnosis not present

## 2019-06-04 DIAGNOSIS — Z66 Do not resuscitate: Secondary | ICD-10-CM | POA: Diagnosis not present

## 2019-06-04 DIAGNOSIS — F0280 Dementia in other diseases classified elsewhere without behavioral disturbance: Secondary | ICD-10-CM | POA: Diagnosis not present

## 2019-06-04 DIAGNOSIS — R451 Restlessness and agitation: Secondary | ICD-10-CM | POA: Diagnosis not present

## 2019-06-04 DIAGNOSIS — Z515 Encounter for palliative care: Secondary | ICD-10-CM | POA: Diagnosis not present

## 2019-06-04 DIAGNOSIS — R410 Disorientation, unspecified: Secondary | ICD-10-CM | POA: Diagnosis not present

## 2019-06-04 DIAGNOSIS — I6523 Occlusion and stenosis of bilateral carotid arteries: Secondary | ICD-10-CM | POA: Diagnosis not present

## 2019-06-05 DIAGNOSIS — R296 Repeated falls: Secondary | ICD-10-CM | POA: Diagnosis not present

## 2019-06-05 DIAGNOSIS — R451 Restlessness and agitation: Secondary | ICD-10-CM | POA: Diagnosis not present

## 2019-06-05 DIAGNOSIS — F0280 Dementia in other diseases classified elsewhere without behavioral disturbance: Secondary | ICD-10-CM | POA: Diagnosis not present

## 2019-06-05 DIAGNOSIS — I10 Essential (primary) hypertension: Secondary | ICD-10-CM | POA: Diagnosis not present

## 2019-06-05 DIAGNOSIS — I6523 Occlusion and stenosis of bilateral carotid arteries: Secondary | ICD-10-CM | POA: Diagnosis not present

## 2019-06-05 DIAGNOSIS — R634 Abnormal weight loss: Secondary | ICD-10-CM | POA: Diagnosis not present

## 2019-06-05 DIAGNOSIS — I779 Disorder of arteries and arterioles, unspecified: Secondary | ICD-10-CM | POA: Diagnosis not present

## 2019-06-05 DIAGNOSIS — Z66 Do not resuscitate: Secondary | ICD-10-CM | POA: Diagnosis not present

## 2019-06-05 DIAGNOSIS — Z515 Encounter for palliative care: Secondary | ICD-10-CM | POA: Diagnosis not present

## 2019-06-05 DIAGNOSIS — R410 Disorientation, unspecified: Secondary | ICD-10-CM | POA: Diagnosis not present

## 2019-06-05 DIAGNOSIS — G300 Alzheimer's disease with early onset: Secondary | ICD-10-CM | POA: Diagnosis not present

## 2019-06-06 DIAGNOSIS — I779 Disorder of arteries and arterioles, unspecified: Secondary | ICD-10-CM | POA: Diagnosis not present

## 2019-06-06 DIAGNOSIS — G300 Alzheimer's disease with early onset: Secondary | ICD-10-CM | POA: Diagnosis not present

## 2019-06-06 DIAGNOSIS — R296 Repeated falls: Secondary | ICD-10-CM | POA: Diagnosis not present

## 2019-06-06 DIAGNOSIS — R410 Disorientation, unspecified: Secondary | ICD-10-CM | POA: Diagnosis not present

## 2019-06-06 DIAGNOSIS — I10 Essential (primary) hypertension: Secondary | ICD-10-CM | POA: Diagnosis not present

## 2019-06-06 DIAGNOSIS — R634 Abnormal weight loss: Secondary | ICD-10-CM | POA: Diagnosis not present

## 2019-06-06 DIAGNOSIS — Z515 Encounter for palliative care: Secondary | ICD-10-CM | POA: Diagnosis not present

## 2019-06-06 DIAGNOSIS — F0280 Dementia in other diseases classified elsewhere without behavioral disturbance: Secondary | ICD-10-CM | POA: Diagnosis not present

## 2019-06-06 DIAGNOSIS — R451 Restlessness and agitation: Secondary | ICD-10-CM | POA: Diagnosis not present

## 2019-06-06 DIAGNOSIS — I6523 Occlusion and stenosis of bilateral carotid arteries: Secondary | ICD-10-CM | POA: Diagnosis not present

## 2019-06-06 DIAGNOSIS — Z66 Do not resuscitate: Secondary | ICD-10-CM | POA: Diagnosis not present

## 2019-06-07 DIAGNOSIS — R296 Repeated falls: Secondary | ICD-10-CM | POA: Diagnosis not present

## 2019-06-07 DIAGNOSIS — G300 Alzheimer's disease with early onset: Secondary | ICD-10-CM | POA: Diagnosis not present

## 2019-06-07 DIAGNOSIS — Z66 Do not resuscitate: Secondary | ICD-10-CM | POA: Diagnosis not present

## 2019-06-07 DIAGNOSIS — I6523 Occlusion and stenosis of bilateral carotid arteries: Secondary | ICD-10-CM | POA: Diagnosis not present

## 2019-06-07 DIAGNOSIS — R410 Disorientation, unspecified: Secondary | ICD-10-CM | POA: Diagnosis not present

## 2019-06-07 DIAGNOSIS — R634 Abnormal weight loss: Secondary | ICD-10-CM | POA: Diagnosis not present

## 2019-06-07 DIAGNOSIS — Z515 Encounter for palliative care: Secondary | ICD-10-CM | POA: Diagnosis not present

## 2019-06-07 DIAGNOSIS — F0280 Dementia in other diseases classified elsewhere without behavioral disturbance: Secondary | ICD-10-CM | POA: Diagnosis not present

## 2019-06-07 DIAGNOSIS — I779 Disorder of arteries and arterioles, unspecified: Secondary | ICD-10-CM | POA: Diagnosis not present

## 2019-06-07 DIAGNOSIS — R451 Restlessness and agitation: Secondary | ICD-10-CM | POA: Diagnosis not present

## 2019-06-07 DIAGNOSIS — I10 Essential (primary) hypertension: Secondary | ICD-10-CM | POA: Diagnosis not present

## 2019-06-08 DIAGNOSIS — Z66 Do not resuscitate: Secondary | ICD-10-CM | POA: Diagnosis not present

## 2019-06-08 DIAGNOSIS — R410 Disorientation, unspecified: Secondary | ICD-10-CM | POA: Diagnosis not present

## 2019-06-08 DIAGNOSIS — F0280 Dementia in other diseases classified elsewhere without behavioral disturbance: Secondary | ICD-10-CM | POA: Diagnosis not present

## 2019-06-08 DIAGNOSIS — I6523 Occlusion and stenosis of bilateral carotid arteries: Secondary | ICD-10-CM | POA: Diagnosis not present

## 2019-06-08 DIAGNOSIS — I779 Disorder of arteries and arterioles, unspecified: Secondary | ICD-10-CM | POA: Diagnosis not present

## 2019-06-08 DIAGNOSIS — Z515 Encounter for palliative care: Secondary | ICD-10-CM | POA: Diagnosis not present

## 2019-06-08 DIAGNOSIS — R634 Abnormal weight loss: Secondary | ICD-10-CM | POA: Diagnosis not present

## 2019-06-08 DIAGNOSIS — R451 Restlessness and agitation: Secondary | ICD-10-CM | POA: Diagnosis not present

## 2019-06-08 DIAGNOSIS — I10 Essential (primary) hypertension: Secondary | ICD-10-CM | POA: Diagnosis not present

## 2019-06-08 DIAGNOSIS — G300 Alzheimer's disease with early onset: Secondary | ICD-10-CM | POA: Diagnosis not present

## 2019-06-08 DIAGNOSIS — R296 Repeated falls: Secondary | ICD-10-CM | POA: Diagnosis not present

## 2019-06-09 DIAGNOSIS — I6523 Occlusion and stenosis of bilateral carotid arteries: Secondary | ICD-10-CM | POA: Diagnosis not present

## 2019-06-09 DIAGNOSIS — R410 Disorientation, unspecified: Secondary | ICD-10-CM | POA: Diagnosis not present

## 2019-06-09 DIAGNOSIS — F0280 Dementia in other diseases classified elsewhere without behavioral disturbance: Secondary | ICD-10-CM | POA: Diagnosis not present

## 2019-06-09 DIAGNOSIS — I10 Essential (primary) hypertension: Secondary | ICD-10-CM | POA: Diagnosis not present

## 2019-06-09 DIAGNOSIS — Z515 Encounter for palliative care: Secondary | ICD-10-CM | POA: Diagnosis not present

## 2019-06-09 DIAGNOSIS — R296 Repeated falls: Secondary | ICD-10-CM | POA: Diagnosis not present

## 2019-06-09 DIAGNOSIS — R634 Abnormal weight loss: Secondary | ICD-10-CM | POA: Diagnosis not present

## 2019-06-09 DIAGNOSIS — R451 Restlessness and agitation: Secondary | ICD-10-CM | POA: Diagnosis not present

## 2019-06-09 DIAGNOSIS — G300 Alzheimer's disease with early onset: Secondary | ICD-10-CM | POA: Diagnosis not present

## 2019-06-09 DIAGNOSIS — I779 Disorder of arteries and arterioles, unspecified: Secondary | ICD-10-CM | POA: Diagnosis not present

## 2019-06-09 DIAGNOSIS — Z66 Do not resuscitate: Secondary | ICD-10-CM | POA: Diagnosis not present

## 2019-06-10 DIAGNOSIS — R634 Abnormal weight loss: Secondary | ICD-10-CM | POA: Diagnosis not present

## 2019-06-10 DIAGNOSIS — R451 Restlessness and agitation: Secondary | ICD-10-CM | POA: Diagnosis not present

## 2019-06-10 DIAGNOSIS — Z515 Encounter for palliative care: Secondary | ICD-10-CM | POA: Diagnosis not present

## 2019-06-10 DIAGNOSIS — I10 Essential (primary) hypertension: Secondary | ICD-10-CM | POA: Diagnosis not present

## 2019-06-10 DIAGNOSIS — R410 Disorientation, unspecified: Secondary | ICD-10-CM | POA: Diagnosis not present

## 2019-06-10 DIAGNOSIS — R296 Repeated falls: Secondary | ICD-10-CM | POA: Diagnosis not present

## 2019-06-10 DIAGNOSIS — G300 Alzheimer's disease with early onset: Secondary | ICD-10-CM | POA: Diagnosis not present

## 2019-06-10 DIAGNOSIS — Z66 Do not resuscitate: Secondary | ICD-10-CM | POA: Diagnosis not present

## 2019-06-10 DIAGNOSIS — I779 Disorder of arteries and arterioles, unspecified: Secondary | ICD-10-CM | POA: Diagnosis not present

## 2019-06-10 DIAGNOSIS — F0280 Dementia in other diseases classified elsewhere without behavioral disturbance: Secondary | ICD-10-CM | POA: Diagnosis not present

## 2019-06-10 DIAGNOSIS — I6523 Occlusion and stenosis of bilateral carotid arteries: Secondary | ICD-10-CM | POA: Diagnosis not present

## 2019-06-11 DIAGNOSIS — R634 Abnormal weight loss: Secondary | ICD-10-CM | POA: Diagnosis not present

## 2019-06-11 DIAGNOSIS — R296 Repeated falls: Secondary | ICD-10-CM | POA: Diagnosis not present

## 2019-06-11 DIAGNOSIS — R451 Restlessness and agitation: Secondary | ICD-10-CM | POA: Diagnosis not present

## 2019-06-11 DIAGNOSIS — I6523 Occlusion and stenosis of bilateral carotid arteries: Secondary | ICD-10-CM | POA: Diagnosis not present

## 2019-06-11 DIAGNOSIS — Z66 Do not resuscitate: Secondary | ICD-10-CM | POA: Diagnosis not present

## 2019-06-11 DIAGNOSIS — G300 Alzheimer's disease with early onset: Secondary | ICD-10-CM | POA: Diagnosis not present

## 2019-06-11 DIAGNOSIS — R410 Disorientation, unspecified: Secondary | ICD-10-CM | POA: Diagnosis not present

## 2019-06-11 DIAGNOSIS — Z515 Encounter for palliative care: Secondary | ICD-10-CM | POA: Diagnosis not present

## 2019-06-11 DIAGNOSIS — F0280 Dementia in other diseases classified elsewhere without behavioral disturbance: Secondary | ICD-10-CM | POA: Diagnosis not present

## 2019-06-11 DIAGNOSIS — I779 Disorder of arteries and arterioles, unspecified: Secondary | ICD-10-CM | POA: Diagnosis not present

## 2019-06-11 DIAGNOSIS — I10 Essential (primary) hypertension: Secondary | ICD-10-CM | POA: Diagnosis not present

## 2019-06-12 DIAGNOSIS — Z515 Encounter for palliative care: Secondary | ICD-10-CM | POA: Diagnosis not present

## 2019-06-12 DIAGNOSIS — Z66 Do not resuscitate: Secondary | ICD-10-CM | POA: Diagnosis not present

## 2019-06-12 DIAGNOSIS — I10 Essential (primary) hypertension: Secondary | ICD-10-CM | POA: Diagnosis not present

## 2019-06-12 DIAGNOSIS — R296 Repeated falls: Secondary | ICD-10-CM | POA: Diagnosis not present

## 2019-06-12 DIAGNOSIS — G300 Alzheimer's disease with early onset: Secondary | ICD-10-CM | POA: Diagnosis not present

## 2019-06-12 DIAGNOSIS — I6523 Occlusion and stenosis of bilateral carotid arteries: Secondary | ICD-10-CM | POA: Diagnosis not present

## 2019-06-12 DIAGNOSIS — F0280 Dementia in other diseases classified elsewhere without behavioral disturbance: Secondary | ICD-10-CM | POA: Diagnosis not present

## 2019-06-12 DIAGNOSIS — R451 Restlessness and agitation: Secondary | ICD-10-CM | POA: Diagnosis not present

## 2019-06-12 DIAGNOSIS — R634 Abnormal weight loss: Secondary | ICD-10-CM | POA: Diagnosis not present

## 2019-06-12 DIAGNOSIS — I779 Disorder of arteries and arterioles, unspecified: Secondary | ICD-10-CM | POA: Diagnosis not present

## 2019-06-12 DIAGNOSIS — R410 Disorientation, unspecified: Secondary | ICD-10-CM | POA: Diagnosis not present

## 2019-06-13 DIAGNOSIS — R296 Repeated falls: Secondary | ICD-10-CM | POA: Diagnosis not present

## 2019-06-13 DIAGNOSIS — G300 Alzheimer's disease with early onset: Secondary | ICD-10-CM | POA: Diagnosis not present

## 2019-06-13 DIAGNOSIS — I10 Essential (primary) hypertension: Secondary | ICD-10-CM | POA: Diagnosis not present

## 2019-06-13 DIAGNOSIS — Z515 Encounter for palliative care: Secondary | ICD-10-CM | POA: Diagnosis not present

## 2019-06-13 DIAGNOSIS — I6523 Occlusion and stenosis of bilateral carotid arteries: Secondary | ICD-10-CM | POA: Diagnosis not present

## 2019-06-13 DIAGNOSIS — Z66 Do not resuscitate: Secondary | ICD-10-CM | POA: Diagnosis not present

## 2019-06-13 DIAGNOSIS — R451 Restlessness and agitation: Secondary | ICD-10-CM | POA: Diagnosis not present

## 2019-06-13 DIAGNOSIS — R410 Disorientation, unspecified: Secondary | ICD-10-CM | POA: Diagnosis not present

## 2019-06-13 DIAGNOSIS — F0280 Dementia in other diseases classified elsewhere without behavioral disturbance: Secondary | ICD-10-CM | POA: Diagnosis not present

## 2019-06-13 DIAGNOSIS — R634 Abnormal weight loss: Secondary | ICD-10-CM | POA: Diagnosis not present

## 2019-06-13 DIAGNOSIS — I779 Disorder of arteries and arterioles, unspecified: Secondary | ICD-10-CM | POA: Diagnosis not present

## 2019-06-14 DIAGNOSIS — Z66 Do not resuscitate: Secondary | ICD-10-CM | POA: Diagnosis not present

## 2019-06-14 DIAGNOSIS — Z515 Encounter for palliative care: Secondary | ICD-10-CM | POA: Diagnosis not present

## 2019-06-14 DIAGNOSIS — R634 Abnormal weight loss: Secondary | ICD-10-CM | POA: Diagnosis not present

## 2019-06-14 DIAGNOSIS — R410 Disorientation, unspecified: Secondary | ICD-10-CM | POA: Diagnosis not present

## 2019-06-14 DIAGNOSIS — R296 Repeated falls: Secondary | ICD-10-CM | POA: Diagnosis not present

## 2019-06-14 DIAGNOSIS — G300 Alzheimer's disease with early onset: Secondary | ICD-10-CM | POA: Diagnosis not present

## 2019-06-14 DIAGNOSIS — I10 Essential (primary) hypertension: Secondary | ICD-10-CM | POA: Diagnosis not present

## 2019-06-14 DIAGNOSIS — F0280 Dementia in other diseases classified elsewhere without behavioral disturbance: Secondary | ICD-10-CM | POA: Diagnosis not present

## 2019-06-14 DIAGNOSIS — R451 Restlessness and agitation: Secondary | ICD-10-CM | POA: Diagnosis not present

## 2019-06-14 DIAGNOSIS — I779 Disorder of arteries and arterioles, unspecified: Secondary | ICD-10-CM | POA: Diagnosis not present

## 2019-06-14 DIAGNOSIS — I6523 Occlusion and stenosis of bilateral carotid arteries: Secondary | ICD-10-CM | POA: Diagnosis not present

## 2019-06-15 DIAGNOSIS — R410 Disorientation, unspecified: Secondary | ICD-10-CM | POA: Diagnosis not present

## 2019-06-15 DIAGNOSIS — R296 Repeated falls: Secondary | ICD-10-CM | POA: Diagnosis not present

## 2019-06-15 DIAGNOSIS — G300 Alzheimer's disease with early onset: Secondary | ICD-10-CM | POA: Diagnosis not present

## 2019-06-15 DIAGNOSIS — F0280 Dementia in other diseases classified elsewhere without behavioral disturbance: Secondary | ICD-10-CM | POA: Diagnosis not present

## 2019-06-15 DIAGNOSIS — Z515 Encounter for palliative care: Secondary | ICD-10-CM | POA: Diagnosis not present

## 2019-06-15 DIAGNOSIS — R451 Restlessness and agitation: Secondary | ICD-10-CM | POA: Diagnosis not present

## 2019-06-15 DIAGNOSIS — Z66 Do not resuscitate: Secondary | ICD-10-CM | POA: Diagnosis not present

## 2019-06-15 DIAGNOSIS — R634 Abnormal weight loss: Secondary | ICD-10-CM | POA: Diagnosis not present

## 2019-06-15 DIAGNOSIS — I6523 Occlusion and stenosis of bilateral carotid arteries: Secondary | ICD-10-CM | POA: Diagnosis not present

## 2019-06-15 DIAGNOSIS — I779 Disorder of arteries and arterioles, unspecified: Secondary | ICD-10-CM | POA: Diagnosis not present

## 2019-06-15 DIAGNOSIS — I10 Essential (primary) hypertension: Secondary | ICD-10-CM | POA: Diagnosis not present

## 2019-06-16 DIAGNOSIS — I6523 Occlusion and stenosis of bilateral carotid arteries: Secondary | ICD-10-CM | POA: Diagnosis not present

## 2019-06-16 DIAGNOSIS — R634 Abnormal weight loss: Secondary | ICD-10-CM | POA: Diagnosis not present

## 2019-06-16 DIAGNOSIS — G300 Alzheimer's disease with early onset: Secondary | ICD-10-CM | POA: Diagnosis not present

## 2019-06-16 DIAGNOSIS — I10 Essential (primary) hypertension: Secondary | ICD-10-CM | POA: Diagnosis not present

## 2019-06-16 DIAGNOSIS — R296 Repeated falls: Secondary | ICD-10-CM | POA: Diagnosis not present

## 2019-06-16 DIAGNOSIS — Z66 Do not resuscitate: Secondary | ICD-10-CM | POA: Diagnosis not present

## 2019-06-16 DIAGNOSIS — R410 Disorientation, unspecified: Secondary | ICD-10-CM | POA: Diagnosis not present

## 2019-06-16 DIAGNOSIS — R451 Restlessness and agitation: Secondary | ICD-10-CM | POA: Diagnosis not present

## 2019-06-16 DIAGNOSIS — I779 Disorder of arteries and arterioles, unspecified: Secondary | ICD-10-CM | POA: Diagnosis not present

## 2019-06-16 DIAGNOSIS — Z515 Encounter for palliative care: Secondary | ICD-10-CM | POA: Diagnosis not present

## 2019-06-16 DIAGNOSIS — F0280 Dementia in other diseases classified elsewhere without behavioral disturbance: Secondary | ICD-10-CM | POA: Diagnosis not present

## 2019-06-17 DIAGNOSIS — R451 Restlessness and agitation: Secondary | ICD-10-CM | POA: Diagnosis not present

## 2019-06-17 DIAGNOSIS — Z66 Do not resuscitate: Secondary | ICD-10-CM | POA: Diagnosis not present

## 2019-06-17 DIAGNOSIS — I6523 Occlusion and stenosis of bilateral carotid arteries: Secondary | ICD-10-CM | POA: Diagnosis not present

## 2019-06-17 DIAGNOSIS — I10 Essential (primary) hypertension: Secondary | ICD-10-CM | POA: Diagnosis not present

## 2019-06-17 DIAGNOSIS — F0280 Dementia in other diseases classified elsewhere without behavioral disturbance: Secondary | ICD-10-CM | POA: Diagnosis not present

## 2019-06-17 DIAGNOSIS — I779 Disorder of arteries and arterioles, unspecified: Secondary | ICD-10-CM | POA: Diagnosis not present

## 2019-06-17 DIAGNOSIS — R296 Repeated falls: Secondary | ICD-10-CM | POA: Diagnosis not present

## 2019-06-17 DIAGNOSIS — R634 Abnormal weight loss: Secondary | ICD-10-CM | POA: Diagnosis not present

## 2019-06-17 DIAGNOSIS — R410 Disorientation, unspecified: Secondary | ICD-10-CM | POA: Diagnosis not present

## 2019-06-17 DIAGNOSIS — Z515 Encounter for palliative care: Secondary | ICD-10-CM | POA: Diagnosis not present

## 2019-06-17 DIAGNOSIS — G300 Alzheimer's disease with early onset: Secondary | ICD-10-CM | POA: Diagnosis not present

## 2019-06-18 DIAGNOSIS — G300 Alzheimer's disease with early onset: Secondary | ICD-10-CM | POA: Diagnosis not present

## 2019-06-18 DIAGNOSIS — Z66 Do not resuscitate: Secondary | ICD-10-CM | POA: Diagnosis not present

## 2019-06-18 DIAGNOSIS — F0280 Dementia in other diseases classified elsewhere without behavioral disturbance: Secondary | ICD-10-CM | POA: Diagnosis not present

## 2019-06-18 DIAGNOSIS — I6523 Occlusion and stenosis of bilateral carotid arteries: Secondary | ICD-10-CM | POA: Diagnosis not present

## 2019-06-18 DIAGNOSIS — R410 Disorientation, unspecified: Secondary | ICD-10-CM | POA: Diagnosis not present

## 2019-06-18 DIAGNOSIS — I779 Disorder of arteries and arterioles, unspecified: Secondary | ICD-10-CM | POA: Diagnosis not present

## 2019-06-18 DIAGNOSIS — R634 Abnormal weight loss: Secondary | ICD-10-CM | POA: Diagnosis not present

## 2019-06-18 DIAGNOSIS — R451 Restlessness and agitation: Secondary | ICD-10-CM | POA: Diagnosis not present

## 2019-06-18 DIAGNOSIS — I10 Essential (primary) hypertension: Secondary | ICD-10-CM | POA: Diagnosis not present

## 2019-06-18 DIAGNOSIS — R296 Repeated falls: Secondary | ICD-10-CM | POA: Diagnosis not present

## 2019-06-18 DIAGNOSIS — Z515 Encounter for palliative care: Secondary | ICD-10-CM | POA: Diagnosis not present

## 2019-06-19 DIAGNOSIS — R634 Abnormal weight loss: Secondary | ICD-10-CM | POA: Diagnosis not present

## 2019-06-19 DIAGNOSIS — I10 Essential (primary) hypertension: Secondary | ICD-10-CM | POA: Diagnosis not present

## 2019-06-19 DIAGNOSIS — Z66 Do not resuscitate: Secondary | ICD-10-CM | POA: Diagnosis not present

## 2019-06-19 DIAGNOSIS — Z515 Encounter for palliative care: Secondary | ICD-10-CM | POA: Diagnosis not present

## 2019-06-19 DIAGNOSIS — R410 Disorientation, unspecified: Secondary | ICD-10-CM | POA: Diagnosis not present

## 2019-06-19 DIAGNOSIS — R451 Restlessness and agitation: Secondary | ICD-10-CM | POA: Diagnosis not present

## 2019-06-19 DIAGNOSIS — I6523 Occlusion and stenosis of bilateral carotid arteries: Secondary | ICD-10-CM | POA: Diagnosis not present

## 2019-06-19 DIAGNOSIS — F0280 Dementia in other diseases classified elsewhere without behavioral disturbance: Secondary | ICD-10-CM | POA: Diagnosis not present

## 2019-06-19 DIAGNOSIS — G300 Alzheimer's disease with early onset: Secondary | ICD-10-CM | POA: Diagnosis not present

## 2019-06-19 DIAGNOSIS — R296 Repeated falls: Secondary | ICD-10-CM | POA: Diagnosis not present

## 2019-06-19 DIAGNOSIS — I779 Disorder of arteries and arterioles, unspecified: Secondary | ICD-10-CM | POA: Diagnosis not present

## 2019-06-20 DIAGNOSIS — F0280 Dementia in other diseases classified elsewhere without behavioral disturbance: Secondary | ICD-10-CM | POA: Diagnosis not present

## 2019-06-20 DIAGNOSIS — Z66 Do not resuscitate: Secondary | ICD-10-CM | POA: Diagnosis not present

## 2019-06-20 DIAGNOSIS — R410 Disorientation, unspecified: Secondary | ICD-10-CM | POA: Diagnosis not present

## 2019-06-20 DIAGNOSIS — Z515 Encounter for palliative care: Secondary | ICD-10-CM | POA: Diagnosis not present

## 2019-06-20 DIAGNOSIS — G300 Alzheimer's disease with early onset: Secondary | ICD-10-CM | POA: Diagnosis not present

## 2019-06-20 DIAGNOSIS — R634 Abnormal weight loss: Secondary | ICD-10-CM | POA: Diagnosis not present

## 2019-06-20 DIAGNOSIS — R296 Repeated falls: Secondary | ICD-10-CM | POA: Diagnosis not present

## 2019-06-20 DIAGNOSIS — I10 Essential (primary) hypertension: Secondary | ICD-10-CM | POA: Diagnosis not present

## 2019-06-20 DIAGNOSIS — I779 Disorder of arteries and arterioles, unspecified: Secondary | ICD-10-CM | POA: Diagnosis not present

## 2019-06-20 DIAGNOSIS — I6523 Occlusion and stenosis of bilateral carotid arteries: Secondary | ICD-10-CM | POA: Diagnosis not present

## 2019-06-20 DIAGNOSIS — R451 Restlessness and agitation: Secondary | ICD-10-CM | POA: Diagnosis not present

## 2019-06-21 DIAGNOSIS — G300 Alzheimer's disease with early onset: Secondary | ICD-10-CM | POA: Diagnosis not present

## 2019-06-21 DIAGNOSIS — R296 Repeated falls: Secondary | ICD-10-CM | POA: Diagnosis not present

## 2019-06-21 DIAGNOSIS — I10 Essential (primary) hypertension: Secondary | ICD-10-CM | POA: Diagnosis not present

## 2019-06-21 DIAGNOSIS — I779 Disorder of arteries and arterioles, unspecified: Secondary | ICD-10-CM | POA: Diagnosis not present

## 2019-06-21 DIAGNOSIS — I6523 Occlusion and stenosis of bilateral carotid arteries: Secondary | ICD-10-CM | POA: Diagnosis not present

## 2019-06-21 DIAGNOSIS — R634 Abnormal weight loss: Secondary | ICD-10-CM | POA: Diagnosis not present

## 2019-06-21 DIAGNOSIS — Z515 Encounter for palliative care: Secondary | ICD-10-CM | POA: Diagnosis not present

## 2019-06-21 DIAGNOSIS — R451 Restlessness and agitation: Secondary | ICD-10-CM | POA: Diagnosis not present

## 2019-06-21 DIAGNOSIS — F0280 Dementia in other diseases classified elsewhere without behavioral disturbance: Secondary | ICD-10-CM | POA: Diagnosis not present

## 2019-06-21 DIAGNOSIS — R410 Disorientation, unspecified: Secondary | ICD-10-CM | POA: Diagnosis not present

## 2019-06-21 DIAGNOSIS — Z66 Do not resuscitate: Secondary | ICD-10-CM | POA: Diagnosis not present

## 2019-06-22 DIAGNOSIS — I779 Disorder of arteries and arterioles, unspecified: Secondary | ICD-10-CM | POA: Diagnosis not present

## 2019-06-22 DIAGNOSIS — R296 Repeated falls: Secondary | ICD-10-CM | POA: Diagnosis not present

## 2019-06-22 DIAGNOSIS — R634 Abnormal weight loss: Secondary | ICD-10-CM | POA: Diagnosis not present

## 2019-06-22 DIAGNOSIS — G300 Alzheimer's disease with early onset: Secondary | ICD-10-CM | POA: Diagnosis not present

## 2019-06-22 DIAGNOSIS — I6523 Occlusion and stenosis of bilateral carotid arteries: Secondary | ICD-10-CM | POA: Diagnosis not present

## 2019-06-22 DIAGNOSIS — R451 Restlessness and agitation: Secondary | ICD-10-CM | POA: Diagnosis not present

## 2019-06-22 DIAGNOSIS — Z66 Do not resuscitate: Secondary | ICD-10-CM | POA: Diagnosis not present

## 2019-06-22 DIAGNOSIS — R410 Disorientation, unspecified: Secondary | ICD-10-CM | POA: Diagnosis not present

## 2019-06-22 DIAGNOSIS — I10 Essential (primary) hypertension: Secondary | ICD-10-CM | POA: Diagnosis not present

## 2019-06-22 DIAGNOSIS — Z515 Encounter for palliative care: Secondary | ICD-10-CM | POA: Diagnosis not present

## 2019-06-22 DIAGNOSIS — F0280 Dementia in other diseases classified elsewhere without behavioral disturbance: Secondary | ICD-10-CM | POA: Diagnosis not present

## 2019-06-23 DIAGNOSIS — R451 Restlessness and agitation: Secondary | ICD-10-CM | POA: Diagnosis not present

## 2019-06-23 DIAGNOSIS — R410 Disorientation, unspecified: Secondary | ICD-10-CM | POA: Diagnosis not present

## 2019-06-23 DIAGNOSIS — I10 Essential (primary) hypertension: Secondary | ICD-10-CM | POA: Diagnosis not present

## 2019-06-23 DIAGNOSIS — R634 Abnormal weight loss: Secondary | ICD-10-CM | POA: Diagnosis not present

## 2019-06-23 DIAGNOSIS — G300 Alzheimer's disease with early onset: Secondary | ICD-10-CM | POA: Diagnosis not present

## 2019-06-23 DIAGNOSIS — Z66 Do not resuscitate: Secondary | ICD-10-CM | POA: Diagnosis not present

## 2019-06-23 DIAGNOSIS — I779 Disorder of arteries and arterioles, unspecified: Secondary | ICD-10-CM | POA: Diagnosis not present

## 2019-06-23 DIAGNOSIS — F0280 Dementia in other diseases classified elsewhere without behavioral disturbance: Secondary | ICD-10-CM | POA: Diagnosis not present

## 2019-06-23 DIAGNOSIS — I6523 Occlusion and stenosis of bilateral carotid arteries: Secondary | ICD-10-CM | POA: Diagnosis not present

## 2019-06-23 DIAGNOSIS — Z515 Encounter for palliative care: Secondary | ICD-10-CM | POA: Diagnosis not present

## 2019-06-23 DIAGNOSIS — R296 Repeated falls: Secondary | ICD-10-CM | POA: Diagnosis not present

## 2019-06-24 DIAGNOSIS — I6523 Occlusion and stenosis of bilateral carotid arteries: Secondary | ICD-10-CM | POA: Diagnosis not present

## 2019-06-24 DIAGNOSIS — G300 Alzheimer's disease with early onset: Secondary | ICD-10-CM | POA: Diagnosis not present

## 2019-06-24 DIAGNOSIS — R451 Restlessness and agitation: Secondary | ICD-10-CM | POA: Diagnosis not present

## 2019-06-24 DIAGNOSIS — I779 Disorder of arteries and arterioles, unspecified: Secondary | ICD-10-CM | POA: Diagnosis not present

## 2019-06-24 DIAGNOSIS — Z515 Encounter for palliative care: Secondary | ICD-10-CM | POA: Diagnosis not present

## 2019-06-24 DIAGNOSIS — R634 Abnormal weight loss: Secondary | ICD-10-CM | POA: Diagnosis not present

## 2019-06-24 DIAGNOSIS — I10 Essential (primary) hypertension: Secondary | ICD-10-CM | POA: Diagnosis not present

## 2019-06-24 DIAGNOSIS — Z66 Do not resuscitate: Secondary | ICD-10-CM | POA: Diagnosis not present

## 2019-06-24 DIAGNOSIS — R410 Disorientation, unspecified: Secondary | ICD-10-CM | POA: Diagnosis not present

## 2019-06-24 DIAGNOSIS — R296 Repeated falls: Secondary | ICD-10-CM | POA: Diagnosis not present

## 2019-06-24 DIAGNOSIS — F0280 Dementia in other diseases classified elsewhere without behavioral disturbance: Secondary | ICD-10-CM | POA: Diagnosis not present

## 2019-06-25 DIAGNOSIS — I6523 Occlusion and stenosis of bilateral carotid arteries: Secondary | ICD-10-CM | POA: Diagnosis not present

## 2019-06-25 DIAGNOSIS — F0280 Dementia in other diseases classified elsewhere without behavioral disturbance: Secondary | ICD-10-CM | POA: Diagnosis not present

## 2019-06-25 DIAGNOSIS — I779 Disorder of arteries and arterioles, unspecified: Secondary | ICD-10-CM | POA: Diagnosis not present

## 2019-06-25 DIAGNOSIS — I10 Essential (primary) hypertension: Secondary | ICD-10-CM | POA: Diagnosis not present

## 2019-06-25 DIAGNOSIS — R410 Disorientation, unspecified: Secondary | ICD-10-CM | POA: Diagnosis not present

## 2019-06-25 DIAGNOSIS — Z515 Encounter for palliative care: Secondary | ICD-10-CM | POA: Diagnosis not present

## 2019-06-25 DIAGNOSIS — R451 Restlessness and agitation: Secondary | ICD-10-CM | POA: Diagnosis not present

## 2019-06-25 DIAGNOSIS — G300 Alzheimer's disease with early onset: Secondary | ICD-10-CM | POA: Diagnosis not present

## 2019-06-25 DIAGNOSIS — Z66 Do not resuscitate: Secondary | ICD-10-CM | POA: Diagnosis not present

## 2019-06-25 DIAGNOSIS — R296 Repeated falls: Secondary | ICD-10-CM | POA: Diagnosis not present

## 2019-06-25 DIAGNOSIS — R634 Abnormal weight loss: Secondary | ICD-10-CM | POA: Diagnosis not present

## 2019-06-26 DIAGNOSIS — Z66 Do not resuscitate: Secondary | ICD-10-CM | POA: Diagnosis not present

## 2019-06-26 DIAGNOSIS — R296 Repeated falls: Secondary | ICD-10-CM | POA: Diagnosis not present

## 2019-06-26 DIAGNOSIS — I779 Disorder of arteries and arterioles, unspecified: Secondary | ICD-10-CM | POA: Diagnosis not present

## 2019-06-26 DIAGNOSIS — Z515 Encounter for palliative care: Secondary | ICD-10-CM | POA: Diagnosis not present

## 2019-06-26 DIAGNOSIS — G300 Alzheimer's disease with early onset: Secondary | ICD-10-CM | POA: Diagnosis not present

## 2019-06-26 DIAGNOSIS — I10 Essential (primary) hypertension: Secondary | ICD-10-CM | POA: Diagnosis not present

## 2019-06-26 DIAGNOSIS — R410 Disorientation, unspecified: Secondary | ICD-10-CM | POA: Diagnosis not present

## 2019-06-26 DIAGNOSIS — R634 Abnormal weight loss: Secondary | ICD-10-CM | POA: Diagnosis not present

## 2019-06-26 DIAGNOSIS — F0280 Dementia in other diseases classified elsewhere without behavioral disturbance: Secondary | ICD-10-CM | POA: Diagnosis not present

## 2019-06-26 DIAGNOSIS — R451 Restlessness and agitation: Secondary | ICD-10-CM | POA: Diagnosis not present

## 2019-06-26 DIAGNOSIS — I6523 Occlusion and stenosis of bilateral carotid arteries: Secondary | ICD-10-CM | POA: Diagnosis not present

## 2019-06-27 DIAGNOSIS — F0280 Dementia in other diseases classified elsewhere without behavioral disturbance: Secondary | ICD-10-CM | POA: Diagnosis not present

## 2019-06-27 DIAGNOSIS — I779 Disorder of arteries and arterioles, unspecified: Secondary | ICD-10-CM | POA: Diagnosis not present

## 2019-06-27 DIAGNOSIS — I6523 Occlusion and stenosis of bilateral carotid arteries: Secondary | ICD-10-CM | POA: Diagnosis not present

## 2019-06-27 DIAGNOSIS — Z66 Do not resuscitate: Secondary | ICD-10-CM | POA: Diagnosis not present

## 2019-06-27 DIAGNOSIS — R451 Restlessness and agitation: Secondary | ICD-10-CM | POA: Diagnosis not present

## 2019-06-27 DIAGNOSIS — G300 Alzheimer's disease with early onset: Secondary | ICD-10-CM | POA: Diagnosis not present

## 2019-06-27 DIAGNOSIS — R634 Abnormal weight loss: Secondary | ICD-10-CM | POA: Diagnosis not present

## 2019-06-27 DIAGNOSIS — R296 Repeated falls: Secondary | ICD-10-CM | POA: Diagnosis not present

## 2019-06-27 DIAGNOSIS — I10 Essential (primary) hypertension: Secondary | ICD-10-CM | POA: Diagnosis not present

## 2019-06-27 DIAGNOSIS — R410 Disorientation, unspecified: Secondary | ICD-10-CM | POA: Diagnosis not present

## 2019-06-27 DIAGNOSIS — Z515 Encounter for palliative care: Secondary | ICD-10-CM | POA: Diagnosis not present

## 2019-06-28 DIAGNOSIS — R451 Restlessness and agitation: Secondary | ICD-10-CM | POA: Diagnosis not present

## 2019-06-28 DIAGNOSIS — Z66 Do not resuscitate: Secondary | ICD-10-CM | POA: Diagnosis not present

## 2019-06-28 DIAGNOSIS — I10 Essential (primary) hypertension: Secondary | ICD-10-CM | POA: Diagnosis not present

## 2019-06-28 DIAGNOSIS — R634 Abnormal weight loss: Secondary | ICD-10-CM | POA: Diagnosis not present

## 2019-06-28 DIAGNOSIS — G300 Alzheimer's disease with early onset: Secondary | ICD-10-CM | POA: Diagnosis not present

## 2019-06-28 DIAGNOSIS — R296 Repeated falls: Secondary | ICD-10-CM | POA: Diagnosis not present

## 2019-06-28 DIAGNOSIS — I779 Disorder of arteries and arterioles, unspecified: Secondary | ICD-10-CM | POA: Diagnosis not present

## 2019-06-28 DIAGNOSIS — Z515 Encounter for palliative care: Secondary | ICD-10-CM | POA: Diagnosis not present

## 2019-06-28 DIAGNOSIS — F0280 Dementia in other diseases classified elsewhere without behavioral disturbance: Secondary | ICD-10-CM | POA: Diagnosis not present

## 2019-06-28 DIAGNOSIS — I6523 Occlusion and stenosis of bilateral carotid arteries: Secondary | ICD-10-CM | POA: Diagnosis not present

## 2019-06-28 DIAGNOSIS — R410 Disorientation, unspecified: Secondary | ICD-10-CM | POA: Diagnosis not present

## 2019-06-29 DIAGNOSIS — I779 Disorder of arteries and arterioles, unspecified: Secondary | ICD-10-CM | POA: Diagnosis not present

## 2019-06-29 DIAGNOSIS — Z515 Encounter for palliative care: Secondary | ICD-10-CM | POA: Diagnosis not present

## 2019-06-29 DIAGNOSIS — Z66 Do not resuscitate: Secondary | ICD-10-CM | POA: Diagnosis not present

## 2019-06-29 DIAGNOSIS — R634 Abnormal weight loss: Secondary | ICD-10-CM | POA: Diagnosis not present

## 2019-06-29 DIAGNOSIS — R451 Restlessness and agitation: Secondary | ICD-10-CM | POA: Diagnosis not present

## 2019-06-29 DIAGNOSIS — I10 Essential (primary) hypertension: Secondary | ICD-10-CM | POA: Diagnosis not present

## 2019-06-29 DIAGNOSIS — I6523 Occlusion and stenosis of bilateral carotid arteries: Secondary | ICD-10-CM | POA: Diagnosis not present

## 2019-06-29 DIAGNOSIS — R410 Disorientation, unspecified: Secondary | ICD-10-CM | POA: Diagnosis not present

## 2019-06-29 DIAGNOSIS — G300 Alzheimer's disease with early onset: Secondary | ICD-10-CM | POA: Diagnosis not present

## 2019-06-29 DIAGNOSIS — F0280 Dementia in other diseases classified elsewhere without behavioral disturbance: Secondary | ICD-10-CM | POA: Diagnosis not present

## 2019-06-29 DIAGNOSIS — R296 Repeated falls: Secondary | ICD-10-CM | POA: Diagnosis not present

## 2019-06-30 DIAGNOSIS — G300 Alzheimer's disease with early onset: Secondary | ICD-10-CM | POA: Diagnosis not present

## 2019-06-30 DIAGNOSIS — Z515 Encounter for palliative care: Secondary | ICD-10-CM | POA: Diagnosis not present

## 2019-06-30 DIAGNOSIS — R296 Repeated falls: Secondary | ICD-10-CM | POA: Diagnosis not present

## 2019-06-30 DIAGNOSIS — R634 Abnormal weight loss: Secondary | ICD-10-CM | POA: Diagnosis not present

## 2019-06-30 DIAGNOSIS — R451 Restlessness and agitation: Secondary | ICD-10-CM | POA: Diagnosis not present

## 2019-06-30 DIAGNOSIS — Z66 Do not resuscitate: Secondary | ICD-10-CM | POA: Diagnosis not present

## 2019-06-30 DIAGNOSIS — I6523 Occlusion and stenosis of bilateral carotid arteries: Secondary | ICD-10-CM | POA: Diagnosis not present

## 2019-06-30 DIAGNOSIS — I779 Disorder of arteries and arterioles, unspecified: Secondary | ICD-10-CM | POA: Diagnosis not present

## 2019-06-30 DIAGNOSIS — F0280 Dementia in other diseases classified elsewhere without behavioral disturbance: Secondary | ICD-10-CM | POA: Diagnosis not present

## 2019-06-30 DIAGNOSIS — I10 Essential (primary) hypertension: Secondary | ICD-10-CM | POA: Diagnosis not present

## 2019-06-30 DIAGNOSIS — R410 Disorientation, unspecified: Secondary | ICD-10-CM | POA: Diagnosis not present

## 2019-07-01 DIAGNOSIS — R296 Repeated falls: Secondary | ICD-10-CM | POA: Diagnosis not present

## 2019-07-01 DIAGNOSIS — I6523 Occlusion and stenosis of bilateral carotid arteries: Secondary | ICD-10-CM | POA: Diagnosis not present

## 2019-07-01 DIAGNOSIS — I10 Essential (primary) hypertension: Secondary | ICD-10-CM | POA: Diagnosis not present

## 2019-07-01 DIAGNOSIS — F0280 Dementia in other diseases classified elsewhere without behavioral disturbance: Secondary | ICD-10-CM | POA: Diagnosis not present

## 2019-07-01 DIAGNOSIS — Z515 Encounter for palliative care: Secondary | ICD-10-CM | POA: Diagnosis not present

## 2019-07-01 DIAGNOSIS — Z66 Do not resuscitate: Secondary | ICD-10-CM | POA: Diagnosis not present

## 2019-07-01 DIAGNOSIS — R451 Restlessness and agitation: Secondary | ICD-10-CM | POA: Diagnosis not present

## 2019-07-01 DIAGNOSIS — I779 Disorder of arteries and arterioles, unspecified: Secondary | ICD-10-CM | POA: Diagnosis not present

## 2019-07-01 DIAGNOSIS — R634 Abnormal weight loss: Secondary | ICD-10-CM | POA: Diagnosis not present

## 2019-07-01 DIAGNOSIS — G300 Alzheimer's disease with early onset: Secondary | ICD-10-CM | POA: Diagnosis not present

## 2019-07-01 DIAGNOSIS — R410 Disorientation, unspecified: Secondary | ICD-10-CM | POA: Diagnosis not present

## 2019-07-02 DIAGNOSIS — I779 Disorder of arteries and arterioles, unspecified: Secondary | ICD-10-CM | POA: Diagnosis not present

## 2019-07-02 DIAGNOSIS — I10 Essential (primary) hypertension: Secondary | ICD-10-CM | POA: Diagnosis not present

## 2019-07-02 DIAGNOSIS — G300 Alzheimer's disease with early onset: Secondary | ICD-10-CM | POA: Diagnosis not present

## 2019-07-02 DIAGNOSIS — R296 Repeated falls: Secondary | ICD-10-CM | POA: Diagnosis not present

## 2019-07-02 DIAGNOSIS — Z66 Do not resuscitate: Secondary | ICD-10-CM | POA: Diagnosis not present

## 2019-07-02 DIAGNOSIS — F0280 Dementia in other diseases classified elsewhere without behavioral disturbance: Secondary | ICD-10-CM | POA: Diagnosis not present

## 2019-07-02 DIAGNOSIS — R451 Restlessness and agitation: Secondary | ICD-10-CM | POA: Diagnosis not present

## 2019-07-02 DIAGNOSIS — Z515 Encounter for palliative care: Secondary | ICD-10-CM | POA: Diagnosis not present

## 2019-07-02 DIAGNOSIS — R634 Abnormal weight loss: Secondary | ICD-10-CM | POA: Diagnosis not present

## 2019-07-02 DIAGNOSIS — I6523 Occlusion and stenosis of bilateral carotid arteries: Secondary | ICD-10-CM | POA: Diagnosis not present

## 2019-07-02 DIAGNOSIS — R410 Disorientation, unspecified: Secondary | ICD-10-CM | POA: Diagnosis not present

## 2019-07-03 DIAGNOSIS — F0280 Dementia in other diseases classified elsewhere without behavioral disturbance: Secondary | ICD-10-CM | POA: Diagnosis not present

## 2019-07-03 DIAGNOSIS — I6523 Occlusion and stenosis of bilateral carotid arteries: Secondary | ICD-10-CM | POA: Diagnosis not present

## 2019-07-03 DIAGNOSIS — R410 Disorientation, unspecified: Secondary | ICD-10-CM | POA: Diagnosis not present

## 2019-07-03 DIAGNOSIS — R296 Repeated falls: Secondary | ICD-10-CM | POA: Diagnosis not present

## 2019-07-03 DIAGNOSIS — I779 Disorder of arteries and arterioles, unspecified: Secondary | ICD-10-CM | POA: Diagnosis not present

## 2019-07-03 DIAGNOSIS — G300 Alzheimer's disease with early onset: Secondary | ICD-10-CM | POA: Diagnosis not present

## 2019-07-03 DIAGNOSIS — Z66 Do not resuscitate: Secondary | ICD-10-CM | POA: Diagnosis not present

## 2019-07-03 DIAGNOSIS — R634 Abnormal weight loss: Secondary | ICD-10-CM | POA: Diagnosis not present

## 2019-07-03 DIAGNOSIS — R451 Restlessness and agitation: Secondary | ICD-10-CM | POA: Diagnosis not present

## 2019-07-03 DIAGNOSIS — I10 Essential (primary) hypertension: Secondary | ICD-10-CM | POA: Diagnosis not present

## 2019-07-03 DIAGNOSIS — Z515 Encounter for palliative care: Secondary | ICD-10-CM | POA: Diagnosis not present

## 2019-07-04 DIAGNOSIS — G300 Alzheimer's disease with early onset: Secondary | ICD-10-CM | POA: Diagnosis not present

## 2019-07-04 DIAGNOSIS — Z515 Encounter for palliative care: Secondary | ICD-10-CM | POA: Diagnosis not present

## 2019-07-04 DIAGNOSIS — F0280 Dementia in other diseases classified elsewhere without behavioral disturbance: Secondary | ICD-10-CM | POA: Diagnosis not present

## 2019-07-04 DIAGNOSIS — Z66 Do not resuscitate: Secondary | ICD-10-CM | POA: Diagnosis not present

## 2019-07-04 DIAGNOSIS — I10 Essential (primary) hypertension: Secondary | ICD-10-CM | POA: Diagnosis not present

## 2019-07-04 DIAGNOSIS — R296 Repeated falls: Secondary | ICD-10-CM | POA: Diagnosis not present

## 2019-07-04 DIAGNOSIS — R634 Abnormal weight loss: Secondary | ICD-10-CM | POA: Diagnosis not present

## 2019-07-04 DIAGNOSIS — I6523 Occlusion and stenosis of bilateral carotid arteries: Secondary | ICD-10-CM | POA: Diagnosis not present

## 2019-07-04 DIAGNOSIS — R451 Restlessness and agitation: Secondary | ICD-10-CM | POA: Diagnosis not present

## 2019-07-04 DIAGNOSIS — I779 Disorder of arteries and arterioles, unspecified: Secondary | ICD-10-CM | POA: Diagnosis not present

## 2019-07-04 DIAGNOSIS — R410 Disorientation, unspecified: Secondary | ICD-10-CM | POA: Diagnosis not present

## 2019-07-05 DIAGNOSIS — Z66 Do not resuscitate: Secondary | ICD-10-CM | POA: Diagnosis not present

## 2019-07-05 DIAGNOSIS — R451 Restlessness and agitation: Secondary | ICD-10-CM | POA: Diagnosis not present

## 2019-07-05 DIAGNOSIS — I10 Essential (primary) hypertension: Secondary | ICD-10-CM | POA: Diagnosis not present

## 2019-07-05 DIAGNOSIS — R296 Repeated falls: Secondary | ICD-10-CM | POA: Diagnosis not present

## 2019-07-05 DIAGNOSIS — R410 Disorientation, unspecified: Secondary | ICD-10-CM | POA: Diagnosis not present

## 2019-07-05 DIAGNOSIS — I6523 Occlusion and stenosis of bilateral carotid arteries: Secondary | ICD-10-CM | POA: Diagnosis not present

## 2019-07-05 DIAGNOSIS — I779 Disorder of arteries and arterioles, unspecified: Secondary | ICD-10-CM | POA: Diagnosis not present

## 2019-07-05 DIAGNOSIS — Z515 Encounter for palliative care: Secondary | ICD-10-CM | POA: Diagnosis not present

## 2019-07-05 DIAGNOSIS — F0280 Dementia in other diseases classified elsewhere without behavioral disturbance: Secondary | ICD-10-CM | POA: Diagnosis not present

## 2019-07-05 DIAGNOSIS — G300 Alzheimer's disease with early onset: Secondary | ICD-10-CM | POA: Diagnosis not present

## 2019-07-05 DIAGNOSIS — R634 Abnormal weight loss: Secondary | ICD-10-CM | POA: Diagnosis not present

## 2019-07-06 DIAGNOSIS — G300 Alzheimer's disease with early onset: Secondary | ICD-10-CM | POA: Diagnosis not present

## 2019-07-06 DIAGNOSIS — Z66 Do not resuscitate: Secondary | ICD-10-CM | POA: Diagnosis not present

## 2019-07-06 DIAGNOSIS — R410 Disorientation, unspecified: Secondary | ICD-10-CM | POA: Diagnosis not present

## 2019-07-06 DIAGNOSIS — I10 Essential (primary) hypertension: Secondary | ICD-10-CM | POA: Diagnosis not present

## 2019-07-06 DIAGNOSIS — R296 Repeated falls: Secondary | ICD-10-CM | POA: Diagnosis not present

## 2019-07-06 DIAGNOSIS — F0280 Dementia in other diseases classified elsewhere without behavioral disturbance: Secondary | ICD-10-CM | POA: Diagnosis not present

## 2019-07-06 DIAGNOSIS — I779 Disorder of arteries and arterioles, unspecified: Secondary | ICD-10-CM | POA: Diagnosis not present

## 2019-07-06 DIAGNOSIS — I6523 Occlusion and stenosis of bilateral carotid arteries: Secondary | ICD-10-CM | POA: Diagnosis not present

## 2019-07-06 DIAGNOSIS — Z515 Encounter for palliative care: Secondary | ICD-10-CM | POA: Diagnosis not present

## 2019-07-06 DIAGNOSIS — R451 Restlessness and agitation: Secondary | ICD-10-CM | POA: Diagnosis not present

## 2019-07-06 DIAGNOSIS — R634 Abnormal weight loss: Secondary | ICD-10-CM | POA: Diagnosis not present

## 2019-07-07 DIAGNOSIS — Z515 Encounter for palliative care: Secondary | ICD-10-CM | POA: Diagnosis not present

## 2019-07-07 DIAGNOSIS — Z66 Do not resuscitate: Secondary | ICD-10-CM | POA: Diagnosis not present

## 2019-07-07 DIAGNOSIS — F0280 Dementia in other diseases classified elsewhere without behavioral disturbance: Secondary | ICD-10-CM | POA: Diagnosis not present

## 2019-07-07 DIAGNOSIS — R410 Disorientation, unspecified: Secondary | ICD-10-CM | POA: Diagnosis not present

## 2019-07-07 DIAGNOSIS — I6523 Occlusion and stenosis of bilateral carotid arteries: Secondary | ICD-10-CM | POA: Diagnosis not present

## 2019-07-07 DIAGNOSIS — I779 Disorder of arteries and arterioles, unspecified: Secondary | ICD-10-CM | POA: Diagnosis not present

## 2019-07-07 DIAGNOSIS — I10 Essential (primary) hypertension: Secondary | ICD-10-CM | POA: Diagnosis not present

## 2019-07-07 DIAGNOSIS — G300 Alzheimer's disease with early onset: Secondary | ICD-10-CM | POA: Diagnosis not present

## 2019-07-07 DIAGNOSIS — R634 Abnormal weight loss: Secondary | ICD-10-CM | POA: Diagnosis not present

## 2019-07-07 DIAGNOSIS — R451 Restlessness and agitation: Secondary | ICD-10-CM | POA: Diagnosis not present

## 2019-07-07 DIAGNOSIS — R296 Repeated falls: Secondary | ICD-10-CM | POA: Diagnosis not present

## 2019-07-08 DIAGNOSIS — R410 Disorientation, unspecified: Secondary | ICD-10-CM | POA: Diagnosis not present

## 2019-07-08 DIAGNOSIS — I10 Essential (primary) hypertension: Secondary | ICD-10-CM | POA: Diagnosis not present

## 2019-07-08 DIAGNOSIS — Z66 Do not resuscitate: Secondary | ICD-10-CM | POA: Diagnosis not present

## 2019-07-08 DIAGNOSIS — F0280 Dementia in other diseases classified elsewhere without behavioral disturbance: Secondary | ICD-10-CM | POA: Diagnosis not present

## 2019-07-08 DIAGNOSIS — Z515 Encounter for palliative care: Secondary | ICD-10-CM | POA: Diagnosis not present

## 2019-07-08 DIAGNOSIS — I779 Disorder of arteries and arterioles, unspecified: Secondary | ICD-10-CM | POA: Diagnosis not present

## 2019-07-08 DIAGNOSIS — I6523 Occlusion and stenosis of bilateral carotid arteries: Secondary | ICD-10-CM | POA: Diagnosis not present

## 2019-07-08 DIAGNOSIS — R296 Repeated falls: Secondary | ICD-10-CM | POA: Diagnosis not present

## 2019-07-08 DIAGNOSIS — G300 Alzheimer's disease with early onset: Secondary | ICD-10-CM | POA: Diagnosis not present

## 2019-07-08 DIAGNOSIS — R451 Restlessness and agitation: Secondary | ICD-10-CM | POA: Diagnosis not present

## 2019-07-08 DIAGNOSIS — R634 Abnormal weight loss: Secondary | ICD-10-CM | POA: Diagnosis not present

## 2019-07-09 DIAGNOSIS — I6523 Occlusion and stenosis of bilateral carotid arteries: Secondary | ICD-10-CM | POA: Diagnosis not present

## 2019-07-09 DIAGNOSIS — Z515 Encounter for palliative care: Secondary | ICD-10-CM | POA: Diagnosis not present

## 2019-07-09 DIAGNOSIS — F0280 Dementia in other diseases classified elsewhere without behavioral disturbance: Secondary | ICD-10-CM | POA: Diagnosis not present

## 2019-07-09 DIAGNOSIS — R451 Restlessness and agitation: Secondary | ICD-10-CM | POA: Diagnosis not present

## 2019-07-09 DIAGNOSIS — Z66 Do not resuscitate: Secondary | ICD-10-CM | POA: Diagnosis not present

## 2019-07-09 DIAGNOSIS — R296 Repeated falls: Secondary | ICD-10-CM | POA: Diagnosis not present

## 2019-07-09 DIAGNOSIS — R410 Disorientation, unspecified: Secondary | ICD-10-CM | POA: Diagnosis not present

## 2019-07-09 DIAGNOSIS — I779 Disorder of arteries and arterioles, unspecified: Secondary | ICD-10-CM | POA: Diagnosis not present

## 2019-07-09 DIAGNOSIS — I10 Essential (primary) hypertension: Secondary | ICD-10-CM | POA: Diagnosis not present

## 2019-07-09 DIAGNOSIS — G300 Alzheimer's disease with early onset: Secondary | ICD-10-CM | POA: Diagnosis not present

## 2019-07-09 DIAGNOSIS — R634 Abnormal weight loss: Secondary | ICD-10-CM | POA: Diagnosis not present

## 2019-07-10 DIAGNOSIS — Z515 Encounter for palliative care: Secondary | ICD-10-CM | POA: Diagnosis not present

## 2019-07-10 DIAGNOSIS — F0280 Dementia in other diseases classified elsewhere without behavioral disturbance: Secondary | ICD-10-CM | POA: Diagnosis not present

## 2019-07-10 DIAGNOSIS — R410 Disorientation, unspecified: Secondary | ICD-10-CM | POA: Diagnosis not present

## 2019-07-10 DIAGNOSIS — R634 Abnormal weight loss: Secondary | ICD-10-CM | POA: Diagnosis not present

## 2019-07-10 DIAGNOSIS — I6523 Occlusion and stenosis of bilateral carotid arteries: Secondary | ICD-10-CM | POA: Diagnosis not present

## 2019-07-10 DIAGNOSIS — R296 Repeated falls: Secondary | ICD-10-CM | POA: Diagnosis not present

## 2019-07-10 DIAGNOSIS — I10 Essential (primary) hypertension: Secondary | ICD-10-CM | POA: Diagnosis not present

## 2019-07-10 DIAGNOSIS — R451 Restlessness and agitation: Secondary | ICD-10-CM | POA: Diagnosis not present

## 2019-07-10 DIAGNOSIS — I779 Disorder of arteries and arterioles, unspecified: Secondary | ICD-10-CM | POA: Diagnosis not present

## 2019-07-10 DIAGNOSIS — G300 Alzheimer's disease with early onset: Secondary | ICD-10-CM | POA: Diagnosis not present

## 2019-07-10 DIAGNOSIS — Z66 Do not resuscitate: Secondary | ICD-10-CM | POA: Diagnosis not present

## 2019-07-11 DIAGNOSIS — R296 Repeated falls: Secondary | ICD-10-CM | POA: Diagnosis not present

## 2019-07-11 DIAGNOSIS — Z515 Encounter for palliative care: Secondary | ICD-10-CM | POA: Diagnosis not present

## 2019-07-11 DIAGNOSIS — I10 Essential (primary) hypertension: Secondary | ICD-10-CM | POA: Diagnosis not present

## 2019-07-11 DIAGNOSIS — R451 Restlessness and agitation: Secondary | ICD-10-CM | POA: Diagnosis not present

## 2019-07-11 DIAGNOSIS — R410 Disorientation, unspecified: Secondary | ICD-10-CM | POA: Diagnosis not present

## 2019-07-11 DIAGNOSIS — G300 Alzheimer's disease with early onset: Secondary | ICD-10-CM | POA: Diagnosis not present

## 2019-07-11 DIAGNOSIS — I779 Disorder of arteries and arterioles, unspecified: Secondary | ICD-10-CM | POA: Diagnosis not present

## 2019-07-11 DIAGNOSIS — F0280 Dementia in other diseases classified elsewhere without behavioral disturbance: Secondary | ICD-10-CM | POA: Diagnosis not present

## 2019-07-11 DIAGNOSIS — I6523 Occlusion and stenosis of bilateral carotid arteries: Secondary | ICD-10-CM | POA: Diagnosis not present

## 2019-07-11 DIAGNOSIS — R634 Abnormal weight loss: Secondary | ICD-10-CM | POA: Diagnosis not present

## 2019-07-11 DIAGNOSIS — Z66 Do not resuscitate: Secondary | ICD-10-CM | POA: Diagnosis not present

## 2019-07-12 DIAGNOSIS — R410 Disorientation, unspecified: Secondary | ICD-10-CM | POA: Diagnosis not present

## 2019-07-12 DIAGNOSIS — I6523 Occlusion and stenosis of bilateral carotid arteries: Secondary | ICD-10-CM | POA: Diagnosis not present

## 2019-07-12 DIAGNOSIS — I10 Essential (primary) hypertension: Secondary | ICD-10-CM | POA: Diagnosis not present

## 2019-07-12 DIAGNOSIS — Z515 Encounter for palliative care: Secondary | ICD-10-CM | POA: Diagnosis not present

## 2019-07-12 DIAGNOSIS — R451 Restlessness and agitation: Secondary | ICD-10-CM | POA: Diagnosis not present

## 2019-07-12 DIAGNOSIS — R634 Abnormal weight loss: Secondary | ICD-10-CM | POA: Diagnosis not present

## 2019-07-12 DIAGNOSIS — G300 Alzheimer's disease with early onset: Secondary | ICD-10-CM | POA: Diagnosis not present

## 2019-07-12 DIAGNOSIS — I779 Disorder of arteries and arterioles, unspecified: Secondary | ICD-10-CM | POA: Diagnosis not present

## 2019-07-12 DIAGNOSIS — F0280 Dementia in other diseases classified elsewhere without behavioral disturbance: Secondary | ICD-10-CM | POA: Diagnosis not present

## 2019-07-12 DIAGNOSIS — Z66 Do not resuscitate: Secondary | ICD-10-CM | POA: Diagnosis not present

## 2019-07-12 DIAGNOSIS — R296 Repeated falls: Secondary | ICD-10-CM | POA: Diagnosis not present

## 2019-07-13 DIAGNOSIS — R451 Restlessness and agitation: Secondary | ICD-10-CM | POA: Diagnosis not present

## 2019-07-13 DIAGNOSIS — I779 Disorder of arteries and arterioles, unspecified: Secondary | ICD-10-CM | POA: Diagnosis not present

## 2019-07-13 DIAGNOSIS — I10 Essential (primary) hypertension: Secondary | ICD-10-CM | POA: Diagnosis not present

## 2019-07-13 DIAGNOSIS — Z515 Encounter for palliative care: Secondary | ICD-10-CM | POA: Diagnosis not present

## 2019-07-13 DIAGNOSIS — G300 Alzheimer's disease with early onset: Secondary | ICD-10-CM | POA: Diagnosis not present

## 2019-07-13 DIAGNOSIS — Z66 Do not resuscitate: Secondary | ICD-10-CM | POA: Diagnosis not present

## 2019-07-13 DIAGNOSIS — R634 Abnormal weight loss: Secondary | ICD-10-CM | POA: Diagnosis not present

## 2019-07-13 DIAGNOSIS — I6523 Occlusion and stenosis of bilateral carotid arteries: Secondary | ICD-10-CM | POA: Diagnosis not present

## 2019-07-13 DIAGNOSIS — R296 Repeated falls: Secondary | ICD-10-CM | POA: Diagnosis not present

## 2019-07-13 DIAGNOSIS — R410 Disorientation, unspecified: Secondary | ICD-10-CM | POA: Diagnosis not present

## 2019-07-13 DIAGNOSIS — F0280 Dementia in other diseases classified elsewhere without behavioral disturbance: Secondary | ICD-10-CM | POA: Diagnosis not present

## 2019-07-14 DIAGNOSIS — R634 Abnormal weight loss: Secondary | ICD-10-CM | POA: Diagnosis not present

## 2019-07-14 DIAGNOSIS — R451 Restlessness and agitation: Secondary | ICD-10-CM | POA: Diagnosis not present

## 2019-07-14 DIAGNOSIS — Z515 Encounter for palliative care: Secondary | ICD-10-CM | POA: Diagnosis not present

## 2019-07-14 DIAGNOSIS — I779 Disorder of arteries and arterioles, unspecified: Secondary | ICD-10-CM | POA: Diagnosis not present

## 2019-07-14 DIAGNOSIS — I6523 Occlusion and stenosis of bilateral carotid arteries: Secondary | ICD-10-CM | POA: Diagnosis not present

## 2019-07-14 DIAGNOSIS — R296 Repeated falls: Secondary | ICD-10-CM | POA: Diagnosis not present

## 2019-07-14 DIAGNOSIS — Z66 Do not resuscitate: Secondary | ICD-10-CM | POA: Diagnosis not present

## 2019-07-14 DIAGNOSIS — G300 Alzheimer's disease with early onset: Secondary | ICD-10-CM | POA: Diagnosis not present

## 2019-07-14 DIAGNOSIS — F0280 Dementia in other diseases classified elsewhere without behavioral disturbance: Secondary | ICD-10-CM | POA: Diagnosis not present

## 2019-07-14 DIAGNOSIS — R410 Disorientation, unspecified: Secondary | ICD-10-CM | POA: Diagnosis not present

## 2019-07-14 DIAGNOSIS — I10 Essential (primary) hypertension: Secondary | ICD-10-CM | POA: Diagnosis not present

## 2019-07-15 DIAGNOSIS — F0280 Dementia in other diseases classified elsewhere without behavioral disturbance: Secondary | ICD-10-CM | POA: Diagnosis not present

## 2019-07-15 DIAGNOSIS — Z515 Encounter for palliative care: Secondary | ICD-10-CM | POA: Diagnosis not present

## 2019-07-15 DIAGNOSIS — R410 Disorientation, unspecified: Secondary | ICD-10-CM | POA: Diagnosis not present

## 2019-07-15 DIAGNOSIS — R634 Abnormal weight loss: Secondary | ICD-10-CM | POA: Diagnosis not present

## 2019-07-15 DIAGNOSIS — R296 Repeated falls: Secondary | ICD-10-CM | POA: Diagnosis not present

## 2019-07-15 DIAGNOSIS — I6523 Occlusion and stenosis of bilateral carotid arteries: Secondary | ICD-10-CM | POA: Diagnosis not present

## 2019-07-15 DIAGNOSIS — Z66 Do not resuscitate: Secondary | ICD-10-CM | POA: Diagnosis not present

## 2019-07-15 DIAGNOSIS — I779 Disorder of arteries and arterioles, unspecified: Secondary | ICD-10-CM | POA: Diagnosis not present

## 2019-07-15 DIAGNOSIS — G300 Alzheimer's disease with early onset: Secondary | ICD-10-CM | POA: Diagnosis not present

## 2019-07-15 DIAGNOSIS — R451 Restlessness and agitation: Secondary | ICD-10-CM | POA: Diagnosis not present

## 2019-07-15 DIAGNOSIS — I10 Essential (primary) hypertension: Secondary | ICD-10-CM | POA: Diagnosis not present

## 2019-07-16 DIAGNOSIS — Z66 Do not resuscitate: Secondary | ICD-10-CM | POA: Diagnosis not present

## 2019-07-16 DIAGNOSIS — R451 Restlessness and agitation: Secondary | ICD-10-CM | POA: Diagnosis not present

## 2019-07-16 DIAGNOSIS — F0280 Dementia in other diseases classified elsewhere without behavioral disturbance: Secondary | ICD-10-CM | POA: Diagnosis not present

## 2019-07-16 DIAGNOSIS — I10 Essential (primary) hypertension: Secondary | ICD-10-CM | POA: Diagnosis not present

## 2019-07-16 DIAGNOSIS — G300 Alzheimer's disease with early onset: Secondary | ICD-10-CM | POA: Diagnosis not present

## 2019-07-16 DIAGNOSIS — I779 Disorder of arteries and arterioles, unspecified: Secondary | ICD-10-CM | POA: Diagnosis not present

## 2019-07-16 DIAGNOSIS — R296 Repeated falls: Secondary | ICD-10-CM | POA: Diagnosis not present

## 2019-07-16 DIAGNOSIS — R410 Disorientation, unspecified: Secondary | ICD-10-CM | POA: Diagnosis not present

## 2019-07-16 DIAGNOSIS — R634 Abnormal weight loss: Secondary | ICD-10-CM | POA: Diagnosis not present

## 2019-07-16 DIAGNOSIS — I6523 Occlusion and stenosis of bilateral carotid arteries: Secondary | ICD-10-CM | POA: Diagnosis not present

## 2019-07-16 DIAGNOSIS — Z515 Encounter for palliative care: Secondary | ICD-10-CM | POA: Diagnosis not present

## 2019-07-17 DIAGNOSIS — I779 Disorder of arteries and arterioles, unspecified: Secondary | ICD-10-CM | POA: Diagnosis not present

## 2019-07-17 DIAGNOSIS — R410 Disorientation, unspecified: Secondary | ICD-10-CM | POA: Diagnosis not present

## 2019-07-17 DIAGNOSIS — R634 Abnormal weight loss: Secondary | ICD-10-CM | POA: Diagnosis not present

## 2019-07-17 DIAGNOSIS — F0280 Dementia in other diseases classified elsewhere without behavioral disturbance: Secondary | ICD-10-CM | POA: Diagnosis not present

## 2019-07-17 DIAGNOSIS — G300 Alzheimer's disease with early onset: Secondary | ICD-10-CM | POA: Diagnosis not present

## 2019-07-17 DIAGNOSIS — Z515 Encounter for palliative care: Secondary | ICD-10-CM | POA: Diagnosis not present

## 2019-07-17 DIAGNOSIS — R296 Repeated falls: Secondary | ICD-10-CM | POA: Diagnosis not present

## 2019-07-17 DIAGNOSIS — R451 Restlessness and agitation: Secondary | ICD-10-CM | POA: Diagnosis not present

## 2019-07-17 DIAGNOSIS — I6523 Occlusion and stenosis of bilateral carotid arteries: Secondary | ICD-10-CM | POA: Diagnosis not present

## 2019-07-17 DIAGNOSIS — Z66 Do not resuscitate: Secondary | ICD-10-CM | POA: Diagnosis not present

## 2019-07-17 DIAGNOSIS — I10 Essential (primary) hypertension: Secondary | ICD-10-CM | POA: Diagnosis not present

## 2019-07-18 DIAGNOSIS — R410 Disorientation, unspecified: Secondary | ICD-10-CM | POA: Diagnosis not present

## 2019-07-18 DIAGNOSIS — R451 Restlessness and agitation: Secondary | ICD-10-CM | POA: Diagnosis not present

## 2019-07-18 DIAGNOSIS — R634 Abnormal weight loss: Secondary | ICD-10-CM | POA: Diagnosis not present

## 2019-07-18 DIAGNOSIS — Z515 Encounter for palliative care: Secondary | ICD-10-CM | POA: Diagnosis not present

## 2019-07-18 DIAGNOSIS — I6523 Occlusion and stenosis of bilateral carotid arteries: Secondary | ICD-10-CM | POA: Diagnosis not present

## 2019-07-18 DIAGNOSIS — Z66 Do not resuscitate: Secondary | ICD-10-CM | POA: Diagnosis not present

## 2019-07-18 DIAGNOSIS — F0280 Dementia in other diseases classified elsewhere without behavioral disturbance: Secondary | ICD-10-CM | POA: Diagnosis not present

## 2019-07-18 DIAGNOSIS — G300 Alzheimer's disease with early onset: Secondary | ICD-10-CM | POA: Diagnosis not present

## 2019-07-18 DIAGNOSIS — I779 Disorder of arteries and arterioles, unspecified: Secondary | ICD-10-CM | POA: Diagnosis not present

## 2019-07-18 DIAGNOSIS — R296 Repeated falls: Secondary | ICD-10-CM | POA: Diagnosis not present

## 2019-07-18 DIAGNOSIS — I10 Essential (primary) hypertension: Secondary | ICD-10-CM | POA: Diagnosis not present

## 2019-07-19 DIAGNOSIS — R410 Disorientation, unspecified: Secondary | ICD-10-CM | POA: Diagnosis not present

## 2019-07-19 DIAGNOSIS — I6523 Occlusion and stenosis of bilateral carotid arteries: Secondary | ICD-10-CM | POA: Diagnosis not present

## 2019-07-19 DIAGNOSIS — I10 Essential (primary) hypertension: Secondary | ICD-10-CM | POA: Diagnosis not present

## 2019-07-19 DIAGNOSIS — F0280 Dementia in other diseases classified elsewhere without behavioral disturbance: Secondary | ICD-10-CM | POA: Diagnosis not present

## 2019-07-19 DIAGNOSIS — R296 Repeated falls: Secondary | ICD-10-CM | POA: Diagnosis not present

## 2019-07-19 DIAGNOSIS — G300 Alzheimer's disease with early onset: Secondary | ICD-10-CM | POA: Diagnosis not present

## 2019-07-19 DIAGNOSIS — Z515 Encounter for palliative care: Secondary | ICD-10-CM | POA: Diagnosis not present

## 2019-07-19 DIAGNOSIS — R634 Abnormal weight loss: Secondary | ICD-10-CM | POA: Diagnosis not present

## 2019-07-19 DIAGNOSIS — Z66 Do not resuscitate: Secondary | ICD-10-CM | POA: Diagnosis not present

## 2019-07-19 DIAGNOSIS — R451 Restlessness and agitation: Secondary | ICD-10-CM | POA: Diagnosis not present

## 2019-07-19 DIAGNOSIS — I779 Disorder of arteries and arterioles, unspecified: Secondary | ICD-10-CM | POA: Diagnosis not present

## 2019-07-20 DIAGNOSIS — R410 Disorientation, unspecified: Secondary | ICD-10-CM | POA: Diagnosis not present

## 2019-07-20 DIAGNOSIS — Z515 Encounter for palliative care: Secondary | ICD-10-CM | POA: Diagnosis not present

## 2019-07-20 DIAGNOSIS — I10 Essential (primary) hypertension: Secondary | ICD-10-CM | POA: Diagnosis not present

## 2019-07-20 DIAGNOSIS — I6523 Occlusion and stenosis of bilateral carotid arteries: Secondary | ICD-10-CM | POA: Diagnosis not present

## 2019-07-20 DIAGNOSIS — G300 Alzheimer's disease with early onset: Secondary | ICD-10-CM | POA: Diagnosis not present

## 2019-07-20 DIAGNOSIS — F0280 Dementia in other diseases classified elsewhere without behavioral disturbance: Secondary | ICD-10-CM | POA: Diagnosis not present

## 2019-07-20 DIAGNOSIS — R451 Restlessness and agitation: Secondary | ICD-10-CM | POA: Diagnosis not present

## 2019-07-20 DIAGNOSIS — R296 Repeated falls: Secondary | ICD-10-CM | POA: Diagnosis not present

## 2019-07-20 DIAGNOSIS — I779 Disorder of arteries and arterioles, unspecified: Secondary | ICD-10-CM | POA: Diagnosis not present

## 2019-07-20 DIAGNOSIS — R634 Abnormal weight loss: Secondary | ICD-10-CM | POA: Diagnosis not present

## 2019-07-20 DIAGNOSIS — Z66 Do not resuscitate: Secondary | ICD-10-CM | POA: Diagnosis not present

## 2019-07-21 DIAGNOSIS — Z515 Encounter for palliative care: Secondary | ICD-10-CM | POA: Diagnosis not present

## 2019-07-21 DIAGNOSIS — R410 Disorientation, unspecified: Secondary | ICD-10-CM | POA: Diagnosis not present

## 2019-07-21 DIAGNOSIS — R296 Repeated falls: Secondary | ICD-10-CM | POA: Diagnosis not present

## 2019-07-21 DIAGNOSIS — I779 Disorder of arteries and arterioles, unspecified: Secondary | ICD-10-CM | POA: Diagnosis not present

## 2019-07-21 DIAGNOSIS — I6523 Occlusion and stenosis of bilateral carotid arteries: Secondary | ICD-10-CM | POA: Diagnosis not present

## 2019-07-21 DIAGNOSIS — R634 Abnormal weight loss: Secondary | ICD-10-CM | POA: Diagnosis not present

## 2019-07-21 DIAGNOSIS — I10 Essential (primary) hypertension: Secondary | ICD-10-CM | POA: Diagnosis not present

## 2019-07-21 DIAGNOSIS — Z66 Do not resuscitate: Secondary | ICD-10-CM | POA: Diagnosis not present

## 2019-07-21 DIAGNOSIS — F0280 Dementia in other diseases classified elsewhere without behavioral disturbance: Secondary | ICD-10-CM | POA: Diagnosis not present

## 2019-07-21 DIAGNOSIS — R451 Restlessness and agitation: Secondary | ICD-10-CM | POA: Diagnosis not present

## 2019-07-21 DIAGNOSIS — G300 Alzheimer's disease with early onset: Secondary | ICD-10-CM | POA: Diagnosis not present

## 2019-07-22 DIAGNOSIS — Z66 Do not resuscitate: Secondary | ICD-10-CM | POA: Diagnosis not present

## 2019-07-22 DIAGNOSIS — R296 Repeated falls: Secondary | ICD-10-CM | POA: Diagnosis not present

## 2019-07-22 DIAGNOSIS — R410 Disorientation, unspecified: Secondary | ICD-10-CM | POA: Diagnosis not present

## 2019-07-22 DIAGNOSIS — I6523 Occlusion and stenosis of bilateral carotid arteries: Secondary | ICD-10-CM | POA: Diagnosis not present

## 2019-07-22 DIAGNOSIS — I779 Disorder of arteries and arterioles, unspecified: Secondary | ICD-10-CM | POA: Diagnosis not present

## 2019-07-22 DIAGNOSIS — F0280 Dementia in other diseases classified elsewhere without behavioral disturbance: Secondary | ICD-10-CM | POA: Diagnosis not present

## 2019-07-22 DIAGNOSIS — R634 Abnormal weight loss: Secondary | ICD-10-CM | POA: Diagnosis not present

## 2019-07-22 DIAGNOSIS — Z515 Encounter for palliative care: Secondary | ICD-10-CM | POA: Diagnosis not present

## 2019-07-22 DIAGNOSIS — G300 Alzheimer's disease with early onset: Secondary | ICD-10-CM | POA: Diagnosis not present

## 2019-07-22 DIAGNOSIS — R451 Restlessness and agitation: Secondary | ICD-10-CM | POA: Diagnosis not present

## 2019-07-22 DIAGNOSIS — I10 Essential (primary) hypertension: Secondary | ICD-10-CM | POA: Diagnosis not present

## 2019-07-23 DIAGNOSIS — R634 Abnormal weight loss: Secondary | ICD-10-CM | POA: Diagnosis not present

## 2019-07-23 DIAGNOSIS — F0280 Dementia in other diseases classified elsewhere without behavioral disturbance: Secondary | ICD-10-CM | POA: Diagnosis not present

## 2019-07-23 DIAGNOSIS — R451 Restlessness and agitation: Secondary | ICD-10-CM | POA: Diagnosis not present

## 2019-07-23 DIAGNOSIS — Z515 Encounter for palliative care: Secondary | ICD-10-CM | POA: Diagnosis not present

## 2019-07-23 DIAGNOSIS — I779 Disorder of arteries and arterioles, unspecified: Secondary | ICD-10-CM | POA: Diagnosis not present

## 2019-07-23 DIAGNOSIS — I10 Essential (primary) hypertension: Secondary | ICD-10-CM | POA: Diagnosis not present

## 2019-07-23 DIAGNOSIS — R296 Repeated falls: Secondary | ICD-10-CM | POA: Diagnosis not present

## 2019-07-23 DIAGNOSIS — Z66 Do not resuscitate: Secondary | ICD-10-CM | POA: Diagnosis not present

## 2019-07-23 DIAGNOSIS — R410 Disorientation, unspecified: Secondary | ICD-10-CM | POA: Diagnosis not present

## 2019-07-23 DIAGNOSIS — G300 Alzheimer's disease with early onset: Secondary | ICD-10-CM | POA: Diagnosis not present

## 2019-07-23 DIAGNOSIS — I6523 Occlusion and stenosis of bilateral carotid arteries: Secondary | ICD-10-CM | POA: Diagnosis not present

## 2019-07-24 DIAGNOSIS — R634 Abnormal weight loss: Secondary | ICD-10-CM | POA: Diagnosis not present

## 2019-07-24 DIAGNOSIS — R451 Restlessness and agitation: Secondary | ICD-10-CM | POA: Diagnosis not present

## 2019-07-24 DIAGNOSIS — Z515 Encounter for palliative care: Secondary | ICD-10-CM | POA: Diagnosis not present

## 2019-07-24 DIAGNOSIS — G300 Alzheimer's disease with early onset: Secondary | ICD-10-CM | POA: Diagnosis not present

## 2019-07-24 DIAGNOSIS — Z66 Do not resuscitate: Secondary | ICD-10-CM | POA: Diagnosis not present

## 2019-07-24 DIAGNOSIS — I6523 Occlusion and stenosis of bilateral carotid arteries: Secondary | ICD-10-CM | POA: Diagnosis not present

## 2019-07-24 DIAGNOSIS — F0280 Dementia in other diseases classified elsewhere without behavioral disturbance: Secondary | ICD-10-CM | POA: Diagnosis not present

## 2019-07-24 DIAGNOSIS — I779 Disorder of arteries and arterioles, unspecified: Secondary | ICD-10-CM | POA: Diagnosis not present

## 2019-07-24 DIAGNOSIS — I10 Essential (primary) hypertension: Secondary | ICD-10-CM | POA: Diagnosis not present

## 2019-07-24 DIAGNOSIS — R296 Repeated falls: Secondary | ICD-10-CM | POA: Diagnosis not present

## 2019-07-24 DIAGNOSIS — R410 Disorientation, unspecified: Secondary | ICD-10-CM | POA: Diagnosis not present

## 2019-07-25 DIAGNOSIS — I779 Disorder of arteries and arterioles, unspecified: Secondary | ICD-10-CM | POA: Diagnosis not present

## 2019-07-25 DIAGNOSIS — G300 Alzheimer's disease with early onset: Secondary | ICD-10-CM | POA: Diagnosis not present

## 2019-07-25 DIAGNOSIS — R410 Disorientation, unspecified: Secondary | ICD-10-CM | POA: Diagnosis not present

## 2019-07-25 DIAGNOSIS — R634 Abnormal weight loss: Secondary | ICD-10-CM | POA: Diagnosis not present

## 2019-07-25 DIAGNOSIS — Z515 Encounter for palliative care: Secondary | ICD-10-CM | POA: Diagnosis not present

## 2019-07-25 DIAGNOSIS — R451 Restlessness and agitation: Secondary | ICD-10-CM | POA: Diagnosis not present

## 2019-07-25 DIAGNOSIS — F0280 Dementia in other diseases classified elsewhere without behavioral disturbance: Secondary | ICD-10-CM | POA: Diagnosis not present

## 2019-07-25 DIAGNOSIS — I6523 Occlusion and stenosis of bilateral carotid arteries: Secondary | ICD-10-CM | POA: Diagnosis not present

## 2019-07-25 DIAGNOSIS — R296 Repeated falls: Secondary | ICD-10-CM | POA: Diagnosis not present

## 2019-07-25 DIAGNOSIS — Z66 Do not resuscitate: Secondary | ICD-10-CM | POA: Diagnosis not present

## 2019-07-25 DIAGNOSIS — I10 Essential (primary) hypertension: Secondary | ICD-10-CM | POA: Diagnosis not present

## 2019-07-26 DIAGNOSIS — I779 Disorder of arteries and arterioles, unspecified: Secondary | ICD-10-CM | POA: Diagnosis not present

## 2019-07-26 DIAGNOSIS — R634 Abnormal weight loss: Secondary | ICD-10-CM | POA: Diagnosis not present

## 2019-07-26 DIAGNOSIS — G300 Alzheimer's disease with early onset: Secondary | ICD-10-CM | POA: Diagnosis not present

## 2019-07-26 DIAGNOSIS — R410 Disorientation, unspecified: Secondary | ICD-10-CM | POA: Diagnosis not present

## 2019-07-26 DIAGNOSIS — R296 Repeated falls: Secondary | ICD-10-CM | POA: Diagnosis not present

## 2019-07-26 DIAGNOSIS — Z66 Do not resuscitate: Secondary | ICD-10-CM | POA: Diagnosis not present

## 2019-07-26 DIAGNOSIS — Z515 Encounter for palliative care: Secondary | ICD-10-CM | POA: Diagnosis not present

## 2019-07-26 DIAGNOSIS — I10 Essential (primary) hypertension: Secondary | ICD-10-CM | POA: Diagnosis not present

## 2019-07-26 DIAGNOSIS — R451 Restlessness and agitation: Secondary | ICD-10-CM | POA: Diagnosis not present

## 2019-07-26 DIAGNOSIS — F0280 Dementia in other diseases classified elsewhere without behavioral disturbance: Secondary | ICD-10-CM | POA: Diagnosis not present

## 2019-07-26 DIAGNOSIS — I6523 Occlusion and stenosis of bilateral carotid arteries: Secondary | ICD-10-CM | POA: Diagnosis not present

## 2019-07-27 DIAGNOSIS — I10 Essential (primary) hypertension: Secondary | ICD-10-CM | POA: Diagnosis not present

## 2019-07-27 DIAGNOSIS — G300 Alzheimer's disease with early onset: Secondary | ICD-10-CM | POA: Diagnosis not present

## 2019-07-27 DIAGNOSIS — Z66 Do not resuscitate: Secondary | ICD-10-CM | POA: Diagnosis not present

## 2019-07-27 DIAGNOSIS — R296 Repeated falls: Secondary | ICD-10-CM | POA: Diagnosis not present

## 2019-07-27 DIAGNOSIS — R410 Disorientation, unspecified: Secondary | ICD-10-CM | POA: Diagnosis not present

## 2019-07-27 DIAGNOSIS — R634 Abnormal weight loss: Secondary | ICD-10-CM | POA: Diagnosis not present

## 2019-07-27 DIAGNOSIS — I6523 Occlusion and stenosis of bilateral carotid arteries: Secondary | ICD-10-CM | POA: Diagnosis not present

## 2019-07-27 DIAGNOSIS — I779 Disorder of arteries and arterioles, unspecified: Secondary | ICD-10-CM | POA: Diagnosis not present

## 2019-07-27 DIAGNOSIS — F0280 Dementia in other diseases classified elsewhere without behavioral disturbance: Secondary | ICD-10-CM | POA: Diagnosis not present

## 2019-07-27 DIAGNOSIS — R451 Restlessness and agitation: Secondary | ICD-10-CM | POA: Diagnosis not present

## 2019-07-27 DIAGNOSIS — Z515 Encounter for palliative care: Secondary | ICD-10-CM | POA: Diagnosis not present

## 2019-07-28 DIAGNOSIS — F0280 Dementia in other diseases classified elsewhere without behavioral disturbance: Secondary | ICD-10-CM | POA: Diagnosis not present

## 2019-07-28 DIAGNOSIS — Z515 Encounter for palliative care: Secondary | ICD-10-CM | POA: Diagnosis not present

## 2019-07-28 DIAGNOSIS — R410 Disorientation, unspecified: Secondary | ICD-10-CM | POA: Diagnosis not present

## 2019-07-28 DIAGNOSIS — Z66 Do not resuscitate: Secondary | ICD-10-CM | POA: Diagnosis not present

## 2019-07-28 DIAGNOSIS — R634 Abnormal weight loss: Secondary | ICD-10-CM | POA: Diagnosis not present

## 2019-07-28 DIAGNOSIS — I6523 Occlusion and stenosis of bilateral carotid arteries: Secondary | ICD-10-CM | POA: Diagnosis not present

## 2019-07-28 DIAGNOSIS — I10 Essential (primary) hypertension: Secondary | ICD-10-CM | POA: Diagnosis not present

## 2019-07-28 DIAGNOSIS — R451 Restlessness and agitation: Secondary | ICD-10-CM | POA: Diagnosis not present

## 2019-07-28 DIAGNOSIS — I779 Disorder of arteries and arterioles, unspecified: Secondary | ICD-10-CM | POA: Diagnosis not present

## 2019-07-28 DIAGNOSIS — R296 Repeated falls: Secondary | ICD-10-CM | POA: Diagnosis not present

## 2019-07-28 DIAGNOSIS — G300 Alzheimer's disease with early onset: Secondary | ICD-10-CM | POA: Diagnosis not present

## 2019-07-29 DIAGNOSIS — I6523 Occlusion and stenosis of bilateral carotid arteries: Secondary | ICD-10-CM | POA: Diagnosis not present

## 2019-07-29 DIAGNOSIS — R451 Restlessness and agitation: Secondary | ICD-10-CM | POA: Diagnosis not present

## 2019-07-29 DIAGNOSIS — R296 Repeated falls: Secondary | ICD-10-CM | POA: Diagnosis not present

## 2019-07-29 DIAGNOSIS — Z66 Do not resuscitate: Secondary | ICD-10-CM | POA: Diagnosis not present

## 2019-07-29 DIAGNOSIS — F0280 Dementia in other diseases classified elsewhere without behavioral disturbance: Secondary | ICD-10-CM | POA: Diagnosis not present

## 2019-07-29 DIAGNOSIS — G300 Alzheimer's disease with early onset: Secondary | ICD-10-CM | POA: Diagnosis not present

## 2019-07-29 DIAGNOSIS — R410 Disorientation, unspecified: Secondary | ICD-10-CM | POA: Diagnosis not present

## 2019-07-29 DIAGNOSIS — Z515 Encounter for palliative care: Secondary | ICD-10-CM | POA: Diagnosis not present

## 2019-07-29 DIAGNOSIS — I10 Essential (primary) hypertension: Secondary | ICD-10-CM | POA: Diagnosis not present

## 2019-07-29 DIAGNOSIS — I779 Disorder of arteries and arterioles, unspecified: Secondary | ICD-10-CM | POA: Diagnosis not present

## 2019-07-29 DIAGNOSIS — R634 Abnormal weight loss: Secondary | ICD-10-CM | POA: Diagnosis not present

## 2019-08-30 DIAGNOSIS — Z03818 Encounter for observation for suspected exposure to other biological agents ruled out: Secondary | ICD-10-CM | POA: Diagnosis not present

## 2019-09-09 DIAGNOSIS — Z03818 Encounter for observation for suspected exposure to other biological agents ruled out: Secondary | ICD-10-CM | POA: Diagnosis not present

## 2019-09-14 DIAGNOSIS — R52 Pain, unspecified: Secondary | ICD-10-CM | POA: Diagnosis not present

## 2019-10-14 DIAGNOSIS — Z1159 Encounter for screening for other viral diseases: Secondary | ICD-10-CM | POA: Diagnosis not present

## 2019-11-05 DIAGNOSIS — R05 Cough: Secondary | ICD-10-CM | POA: Diagnosis not present

## 2020-08-31 DEATH — deceased
